# Patient Record
Sex: Female | Born: 1940
Health system: Southern US, Community
[De-identification: ages and names within clinical notes are randomized; demographics above are authoritative.]

## PROBLEM LIST (undated history)

## (undated) DIAGNOSIS — N952 Postmenopausal atrophic vaginitis: Secondary | ICD-10-CM

## (undated) DIAGNOSIS — M1990 Unspecified osteoarthritis, unspecified site: Secondary | ICD-10-CM

## (undated) DIAGNOSIS — M5430 Sciatica, unspecified side: Secondary | ICD-10-CM

## (undated) DIAGNOSIS — S86019A Strain of unspecified Achilles tendon, initial encounter: Secondary | ICD-10-CM

## (undated) DIAGNOSIS — K219 Gastro-esophageal reflux disease without esophagitis: Secondary | ICD-10-CM

## (undated) DIAGNOSIS — N3281 Overactive bladder: Secondary | ICD-10-CM

## (undated) DIAGNOSIS — I619 Nontraumatic intracerebral hemorrhage, unspecified: Secondary | ICD-10-CM

## (undated) DIAGNOSIS — F419 Anxiety disorder, unspecified: Secondary | ICD-10-CM

## (undated) DIAGNOSIS — N814 Uterovaginal prolapse, unspecified: Secondary | ICD-10-CM

## (undated) DIAGNOSIS — L409 Psoriasis, unspecified: Secondary | ICD-10-CM

## (undated) HISTORY — DX: Strain of unspecified achilles tendon, initial encounter: S86.019A

## (undated) HISTORY — DX: Overactive bladder: N32.81

## (undated) HISTORY — DX: Anxiety disorder, unspecified: F41.9

## (undated) HISTORY — DX: Uterovaginal prolapse, unspecified: N81.4

## (undated) HISTORY — DX: Nontraumatic intracerebral hemorrhage, unspecified: I61.9

## (undated) HISTORY — PX: KNEE SURGERY: SHX244

## (undated) HISTORY — PX: APPENDECTOMY: SHX54

## (undated) HISTORY — DX: Postmenopausal atrophic vaginitis: N95.2

## (undated) HISTORY — DX: Sciatica, unspecified side: M54.30

## (undated) HISTORY — PX: FOOT SURGERY: SHX648

## (undated) HISTORY — PX: TOTAL KNEE ARTHROPLASTY: SHX125

---

## 1997-10-19 ENCOUNTER — Other Ambulatory Visit: Admission: RE | Admit: 1997-10-19 | Discharge: 1997-10-19 | Payer: Self-pay | Admitting: Obstetrics and Gynecology

## 1998-02-02 ENCOUNTER — Encounter: Payer: Self-pay | Admitting: Emergency Medicine

## 1998-02-02 ENCOUNTER — Emergency Department (HOSPITAL_COMMUNITY): Admission: EM | Admit: 1998-02-02 | Discharge: 1998-02-02 | Payer: Self-pay | Admitting: Emergency Medicine

## 1998-11-18 ENCOUNTER — Other Ambulatory Visit: Admission: RE | Admit: 1998-11-18 | Discharge: 1998-11-18 | Payer: Self-pay | Admitting: Obstetrics and Gynecology

## 1999-12-13 ENCOUNTER — Other Ambulatory Visit: Admission: RE | Admit: 1999-12-13 | Discharge: 1999-12-13 | Payer: Self-pay | Admitting: Obstetrics and Gynecology

## 2001-03-10 ENCOUNTER — Other Ambulatory Visit: Admission: RE | Admit: 2001-03-10 | Discharge: 2001-03-10 | Payer: Self-pay | Admitting: Obstetrics and Gynecology

## 2002-03-13 ENCOUNTER — Other Ambulatory Visit: Admission: RE | Admit: 2002-03-13 | Discharge: 2002-03-13 | Payer: Self-pay | Admitting: Obstetrics and Gynecology

## 2003-06-23 ENCOUNTER — Other Ambulatory Visit: Admission: RE | Admit: 2003-06-23 | Discharge: 2003-06-23 | Payer: Self-pay | Admitting: Obstetrics and Gynecology

## 2004-09-22 ENCOUNTER — Other Ambulatory Visit: Admission: RE | Admit: 2004-09-22 | Discharge: 2004-09-22 | Payer: Self-pay | Admitting: Obstetrics and Gynecology

## 2005-10-30 ENCOUNTER — Other Ambulatory Visit: Admission: RE | Admit: 2005-10-30 | Discharge: 2005-10-30 | Payer: Self-pay | Admitting: Obstetrics and Gynecology

## 2006-01-29 ENCOUNTER — Encounter: Admission: RE | Admit: 2006-01-29 | Discharge: 2006-01-29 | Payer: Self-pay | Admitting: Internal Medicine

## 2006-12-09 ENCOUNTER — Other Ambulatory Visit: Admission: RE | Admit: 2006-12-09 | Discharge: 2006-12-09 | Payer: Self-pay | Admitting: Obstetrics and Gynecology

## 2007-03-10 ENCOUNTER — Ambulatory Visit (HOSPITAL_COMMUNITY): Admission: RE | Admit: 2007-03-10 | Discharge: 2007-03-10 | Payer: Self-pay | Admitting: Orthopedic Surgery

## 2007-06-23 ENCOUNTER — Inpatient Hospital Stay (HOSPITAL_COMMUNITY): Admission: RE | Admit: 2007-06-23 | Discharge: 2007-06-27 | Payer: Self-pay | Admitting: Orthopedic Surgery

## 2007-07-14 ENCOUNTER — Encounter: Admission: RE | Admit: 2007-07-14 | Discharge: 2007-07-14 | Payer: Self-pay | Admitting: Internal Medicine

## 2007-12-12 ENCOUNTER — Other Ambulatory Visit: Admission: RE | Admit: 2007-12-12 | Discharge: 2007-12-12 | Payer: Self-pay | Admitting: Obstetrics and Gynecology

## 2008-11-21 IMAGING — CR DG CHEST 2V
2 series · 2 of 2 positions shown · non-contrast
Comparison: 03/10/07.

CLINICAL DATA: OA of right knee.  Borderline O2 sats.  Assess for pneumonia. 
CHEST ? 2 VIEW:

[w chest pa]
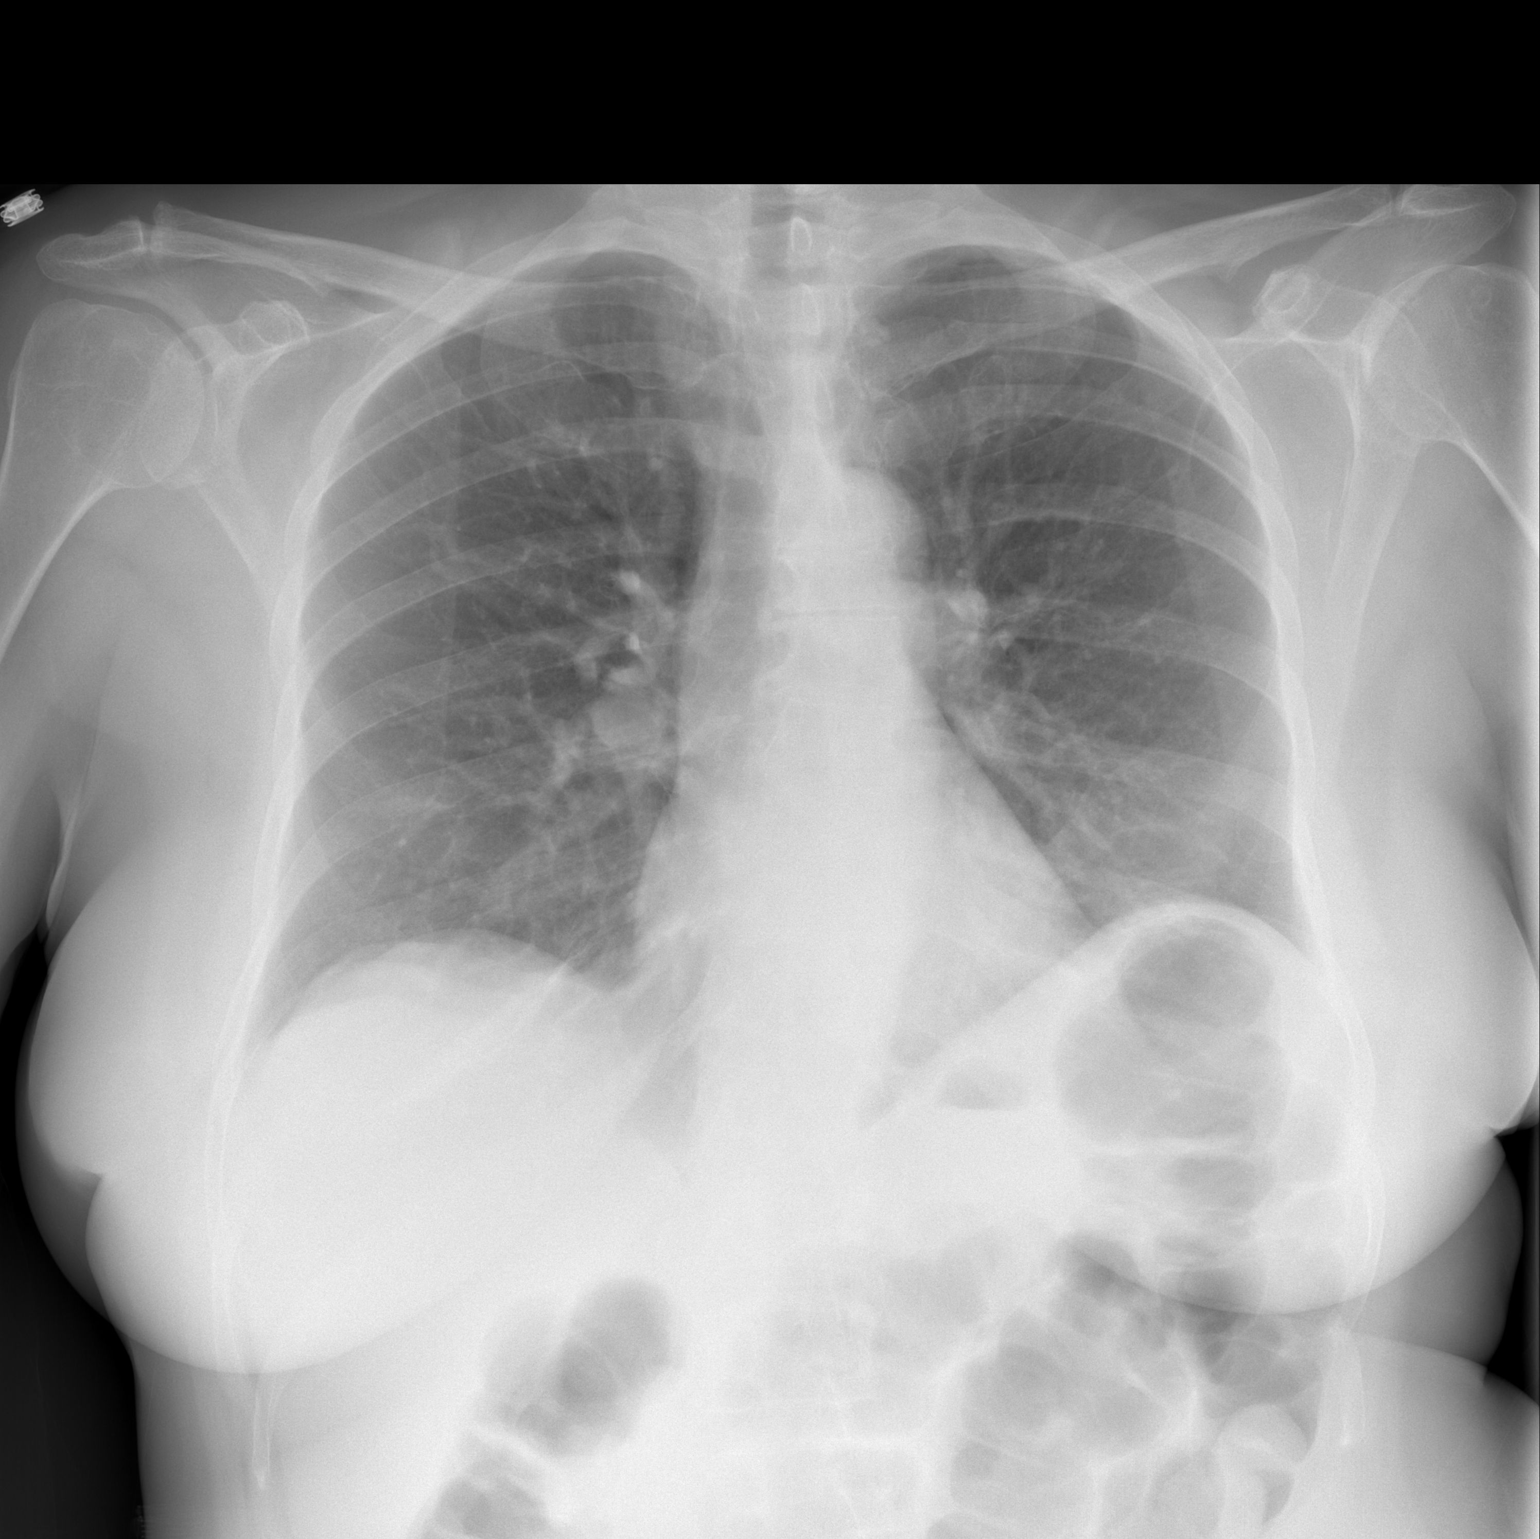

[w chest lat]
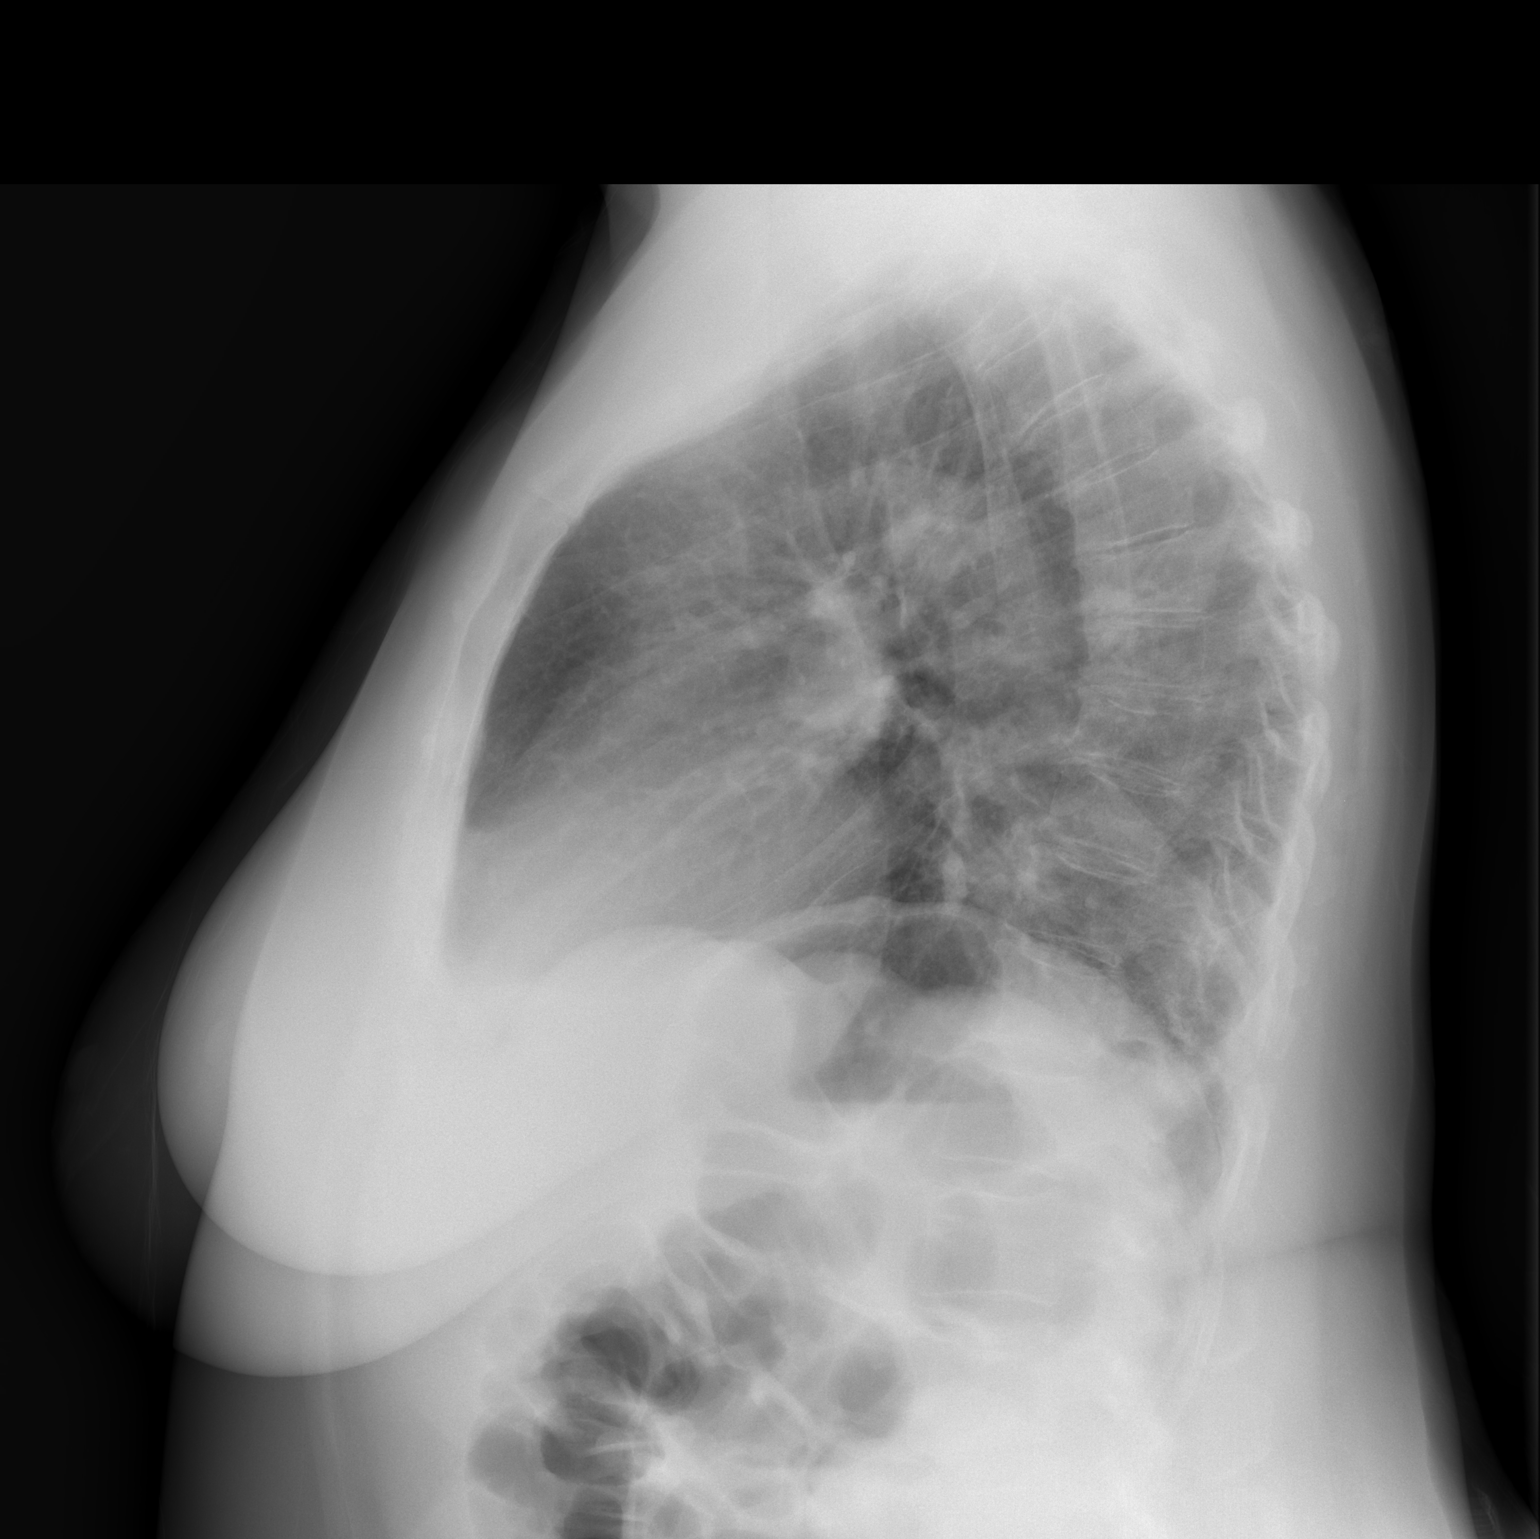

[2 of 2 positions shown; findings below may reference images not displayed]

FINDINGS: Negative for pneumonia or atelectasis.  Cardiomediastinal silhouette appears unremarkable.  Minimal chronic bronchitic changes are likely present.  Diffuse bony demineralization.  Degenerative spondylosis of the thoracic spine.  Gaseous distention of bowel loops in the upper abdomen in the colonic splenic flexure may represent an ileus.
IMPRESSION: Low-volume lungs.  Minimal chronic bronchitic changes.  Suspicion for an ileus.

## 2008-12-13 ENCOUNTER — Ambulatory Visit: Payer: Self-pay | Admitting: Obstetrics and Gynecology

## 2009-05-07 ENCOUNTER — Emergency Department (HOSPITAL_COMMUNITY): Admission: EM | Admit: 2009-05-07 | Discharge: 2009-05-07 | Payer: Self-pay | Admitting: Emergency Medicine

## 2009-05-23 ENCOUNTER — Inpatient Hospital Stay (HOSPITAL_COMMUNITY): Admission: RE | Admit: 2009-05-23 | Discharge: 2009-05-26 | Payer: Self-pay | Admitting: Orthopedic Surgery

## 2009-12-14 ENCOUNTER — Ambulatory Visit: Payer: Self-pay | Admitting: Women's Health

## 2009-12-14 ENCOUNTER — Other Ambulatory Visit: Admission: RE | Admit: 2009-12-14 | Discharge: 2009-12-14 | Payer: Self-pay | Admitting: Obstetrics and Gynecology

## 2010-09-19 LAB — BASIC METABOLIC PANEL
Calcium: 8.1 mg/dL — ABNORMAL LOW (ref 8.4–10.5)
Chloride: 105 mEq/L (ref 96–112)
Creatinine, Ser: 0.54 mg/dL (ref 0.4–1.2)
GFR calc non Af Amer: >60 mL/min (ref >60)
GFR calc non Af Amer: >60 mL/min (ref >60)
Sodium: 138 mEq/L (ref 135–145)

## 2010-09-19 LAB — CBC
HCT: 30.2 % — ABNORMAL LOW (ref 36.0–46.0)
MCHC: 32.5 g/dL (ref 30.0–36.0)
MCHC: 33.3 g/dL (ref 30.0–36.0)
MCV: 87.4 fL (ref 78.0–100.0)
MCV: 87.9 fL (ref 78.0–100.0)
Platelets: 182 10*3/uL (ref 150–400)
Platelets: 198 10*3/uL (ref 150–400)
Platelets: 223 10*3/uL (ref 150–400)
RBC: 3.09 MIL/uL — ABNORMAL LOW (ref 3.87–5.11)
RBC: 3.7 MIL/uL — ABNORMAL LOW (ref 3.87–5.11)
WBC: 6.7 10*3/uL (ref 4.0–10.5)
WBC: 6.8 10*3/uL (ref 4.0–10.5)
WBC: 8.4 10*3/uL (ref 4.0–10.5)
WBC: 9.6 10*3/uL (ref 4.0–10.5)

## 2010-09-19 LAB — PROTIME-INR
INR: 1.28 (ref 0.00–1.49)
INR: 1.95 — ABNORMAL HIGH (ref 0.00–1.49)
INR: 2.06 — ABNORMAL HIGH (ref 0.00–1.49)
Prothrombin Time: 15.9 seconds — ABNORMAL HIGH (ref 11.6–15.2)
Prothrombin Time: 22.1 seconds — ABNORMAL HIGH (ref 11.6–15.2)
Prothrombin Time: 23 seconds — ABNORMAL HIGH (ref 11.6–15.2)

## 2010-09-19 LAB — URINALYSIS, ROUTINE W REFLEX MICROSCOPIC
Ketones, ur: NEGATIVE mg/dL
Nitrite: NEGATIVE
Protein, ur: NEGATIVE mg/dL
pH: 7.5 (ref 5.0–8.0)

## 2010-09-19 LAB — COMPREHENSIVE METABOLIC PANEL
ALT: 20 U/L (ref 0–35)
AST: 18 U/L (ref 0–37)
Albumin: 3.7 g/dL (ref 3.5–5.2)
Calcium: 9.2 mg/dL (ref 8.4–10.5)
Sodium: 138 mEq/L (ref 135–145)
Total Protein: 7.2 g/dL (ref 6.0–8.3)

## 2010-09-19 LAB — TYPE AND SCREEN: ABO/RH(D): AB NEG

## 2010-09-19 LAB — APTT: aPTT: 28 seconds (ref 24–37)

## 2010-09-20 LAB — DIFFERENTIAL
Basophils Absolute: 0 10*3/uL (ref 0.0–0.1)
Lymphocytes Relative: 12 % (ref 12–46)
Monocytes Absolute: 0.8 10*3/uL (ref 0.1–1.0)
Neutro Abs: 7.8 10*3/uL — ABNORMAL HIGH (ref 1.7–7.7)

## 2010-09-20 LAB — POCT CARDIAC MARKERS
CKMB, poc: <1 ng/mL — ABNORMAL LOW (ref 1.0–8.0)
Troponin i, poc: <0.05 ng/mL (ref 0.00–0.09)

## 2010-09-20 LAB — URINE MICROSCOPIC-ADD ON

## 2010-09-20 LAB — URINALYSIS, ROUTINE W REFLEX MICROSCOPIC
Nitrite: NEGATIVE
Protein, ur: 30 mg/dL — AB
Specific Gravity, Urine: 1.027 (ref 1.005–1.030)
Urobilinogen, UA: 0.2 mg/dL (ref 0.0–1.0)

## 2010-09-20 LAB — CBC
HCT: 42.2 % (ref 36.0–46.0)
MCHC: 33.2 g/dL (ref 30.0–36.0)
MCV: 88.4 fL (ref 78.0–100.0)
Platelets: 187 10*3/uL (ref 150–400)
RDW: 13 % (ref 11.5–15.5)
WBC: 10 10*3/uL (ref 4.0–10.5)

## 2010-09-20 LAB — URINE CULTURE: Colony Count: >=100000

## 2010-09-20 LAB — COMPREHENSIVE METABOLIC PANEL
AST: 13 U/L (ref 0–37)
CO2: 27 mEq/L (ref 19–32)
Calcium: 9.3 mg/dL (ref 8.4–10.5)
Creatinine, Ser: 0.61 mg/dL (ref 0.4–1.2)
GFR calc non Af Amer: >60 mL/min (ref >60)

## 2010-09-20 LAB — LIPASE, BLOOD: Lipase: 15 U/L (ref 11–59)

## 2010-10-31 NOTE — H&P (Signed)
[name], [name]            ACCOUNT NO.:  [id number]   MEDICAL RECORD NO.:  [id number]          PATIENT TYPE:  INP   LOCATION:  NA                           FACILITY:  [facility]   PHYSICIAN:  [name], M.D.    DATE OF BIRTH:  1940-08-11   DATE OF ADMISSION:  06/23/2007  DATE OF DISCHARGE:                              HISTORY & PHYSICAL   DATE OF OFFICE VISIT HISTORY AND PHYSICAL:  June 06, 2007.   CHIEF COMPLAINT:  Right knee pain.   HISTORY OF PRESENT ILLNESS:  The patient is a 70 year old female who has  seen by [name] earlier this year for ongoing right knee pain.  She  was seen as a second opinion.  She has been having trouble for quite  some time now, especially over the past year, year and a half.  She has  undergone cortisone and viscous supplementation which only helped  intermittently over time.  She is known to have end-stage arthritis.  It  was felt she would benefit from undergoing a total knee replacement.  Risks and benefits have been discussed.  She elected to proceed with  surgery.  She was scheduled for earlier this year, but we noted that  there was a questionable metal allergy.  She has been seen by the  [facility] and undergone metal testing.  She is negative to cobalt, chrome, alloy and also negative to titanium.  It was felt she would benefit from undergoing knee replacement.  Risks  and benefits discussed, and she was subsequently admitted to the  [facility].   ALLERGIES:  NO KNOWN DRUG ALLERGIES.   CURRENT MEDICATIONS:  1. Soriatane.  2. Calcium.  3. Fosamax.  4. Cod liver oil.  5. Paxil.  6. Sudafed.   PAST MEDICAL HISTORY:  1. Anxiety.  2. Psoriasis.  3. History of bronchitis.  4. Osteoporosis.   PAST SURGICAL HISTORY:  1. Heel spur excision.  2. Laser knee surgery.  3. Appendectomy.   FAMILY HISTORY:  Mother with gallbladder cancer and arthritis.   SOCIAL HISTORY:  Married, retired.  About  eight cigarettes a day smoker.  No alcohol.  Husband will be assisting with care after surgery.   REVIEW OF SYSTEMS:  GENERAL:  No fevers, chills, night sweats.  NEUROLOGIC:  No seizures, syncope or paralysis.  RESPIRATORY:  No  shortness of breath, productive cough or hemoptysis.  CARDIOVASCULAR:  No chest pain, angina, orthopnea.  GI:  No nausea, vomiting, diarrhea or  constipation.  GU:  No dysuria, hematuria or discharge.  MUSCULOSKELETAL:  Right knee.   PHYSICAL EXAMINATION:  VITAL SIGNS:  Pulse 68, respirations 12, blood  pressure 128/76.  GENERAL:  A 70 year old African-American female, well-nourished, well-  developed, slightly overweight, tall frame, alert, oriented and  cooperative, pleasant.  HEENT:  Normocephalic, atraumatic.  Pupils are round and reactive.  Oropharynx clear.  Extraocular movements intact.  Does have a partial  denture plate.  NECK:  Supple.  CHEST:  Clear anterior and posterior chest walls.  HEART:  Regular rate and rhythm.  S1-S2 noted.  No murmurs.  ABDOMEN:  Soft, nontender, slightly round.  Bowel sounds present.  RECTAL/BREAST/GENITALIA:  Not done, not pertinent to present illness.  EXTREMITIES:  Right knee no effusion, marked crepitus, 10 degrees of  valgus.  No instability.   IMPRESSION:  Osteoarthritis right knee.   PLAN:  The patient will be admitted to [name] to undergo  right total knee arthroplasty.  Surgery will be performed by [name].  She has been seen preoperatively by [name] and felt  be in excellent health without any significant medical problems.  No  contraindications to the upcoming procedure.  [name] will be  notified of the room number on admission and will be consulted if needed  for medical assistance with the patient throughout the [facility] course.  Either [name] or [name] hospitalists will be consulted.      [name], P.A.C.      [name], M.D.   Electronically Signed    ALP/MEDQ  D:  06/17/2007  T:  06/17/2007  Job:  [id number]   cc:   [name], M.D.  Fax: [phone]

## 2010-10-31 NOTE — Op Note (Signed)
[name], [name] NO.:  [id number]   MEDICAL RECORD NO.:  [id number]          PATIENT TYPE:  INP   LOCATION:  0007                         FACILITY:  [facility]   PHYSICIAN:  Ollen Gross, M.D.    DATE OF BIRTH:  1940/07/20   DATE OF PROCEDURE:  DATE OF DISCHARGE:                               OPERATIVE REPORT   PREOPERATIVE DIAGNOSIS:  Osteoarthritis, right knee.   POSTOPERATIVE DIAGNOSIS:  Osteoarthritis, right knee.   PROCEDURE:  Right total knee arthroplasty.   SURGEON:  Ollen Gross, M.D.   ASSISTANT:  Alexzandrew L. Perkins, P.A.-C.   ANESTHESIA:  Spinal.   ESTIMATED BLOOD LOSS:  Minimal.   DRAIN:  None.   TOURNIQUET TIME:  36 minutes at 300 mmHg.   COMPLICATIONS:  None.   CONDITION:  Stable to recovery.   BRIEF CLINICAL NOTE:  [name] is a 70 year old female who has end-  stage arthritis of the right knee with progressively worsening pain and  deformity.  She has failed nonoperative management including injections  and presents for a total knee arthroplasty.   PROCEDURE IN DETAIL:  After the successful administration of a spinal  anesthetic with Duramorph, a tourniquet is placed high on her right  thigh and her right lower extremity is prepped and draped in the usual  sterile fashion.  The extremity is wrapped in an Esmarch, knee flexed  and tourniquet inflated to 300 mmHg.  A midline incision is made with a  10 blade through subcutaneous tissue to the level of the extensor  mechanism.  A fresh blade is used to make a medial parapatellar  arthrotomy.  Minimal soft tissue stripping is done medial and we also  elevated the soft tissue over the proximal lateral tibia.  Attention is  being paid to avoiding the patellar tendon on the tibial tubercle.  The  patella is everted, knee flexed 90 degrees, ACL and PCL removed.  A  drill is used to create a starting hole in the distal femur and the  canal is thoroughly irrigated.  The 5-degrees  right valgus alignment  guide is placed and referencing off the posterior condyles, rotation is  marked in the block pinned to remove 11 mm off the distal femur.  I did  11 due to her large flexion contracture.  Distal femoral resection is  made with an oscillating saw.  A sizing block is placed and a size 4 is  the most appropriate.  Rotation is marked off the epicondylar axis.  A  size 4 cutting block is placed and the anterior-posterior and chamfer  cuts are made.   Tibia is subluxed forward and menisci are removed.  Extramedullary  tibial alignment guide is placed referencing proximally at the medial  aspect of the tibial tubercle and distally along the second metatarsal  axis and tibial crest.  The block is pinned to remove approximately 4 mm  off the more deficient lateral side.  Tibial resection is made with an  oscillating saw.  Size 4 is the most appropriate tibial component and  the proximal tibia is prepared with the modular drill and  keel punch for  a size 4.  Femoral preparation is completed with the intercondylar cut.   A size 4 mobile bearing tibial tray and a size 4 posterior-stabilized  femoral trial and a 10-mm posterior-stabilized rotating platform insert  trial are placed.  With the 10 full extension is achieved with excellent  varus and valgus balance throughout full range of motion.  Patella is  then everted and thickness measured to be 23 mm.  Freehand resection is  taken to 14 mm, 38 template is placed, lug holes are drilled, trial  patella is placed, and it tracks normally.  Please note there were two  small arthritic cysts in the patella and the cysts are removed.  The  trial patella is placed and it tracks normally.  Osteophytes are removed  off the posterior femur with the trial in place.  All trials are removed  and the cut bone surfaces are prepared with pulsatile lavage.  Cement is  mixed and once ready for implantation, the size 4 mobile-bearing tibial   tray, size 4 posterior-stabilized femur and 38 patella are cemented into  place.  Patella is held with the clamp.  Trial 10-mm insert is placed,  knee held in full extension and all extruded cement removed.  When the  cement is fully hardened, then the permanent 10-mm posterior-stabilized  rotating platform insert is placed into the tibial tray.  The wound is  copiously irrigated with saline solution.  FloSeal had been injected on  the posterior capsule, then it was injected into the medial and lateral  gutters and  suprapatellar area.  A moist sponge is placed and the  tourniquet released for a total time of 36 minutes.  The sponge is  removed and minimal bleeding encountered.  The bleeding that is  encountered is stopped with electrocautery.  The wound is again  irrigated and the extensor mechanism closed with interrupted #1 PDS.  Flexion against gravity is over 130 degrees.  The subcu is closed with  interrupted 2-0 Vicryl and subcuticular running 4-0 Monocryl.  The  incision is cleaned and dried and Steri-Strips and a bulky sterile  dressing applied.  She is placed into a knee immobilizer, awakened and  transported to recovery in stable condition.      Ollen Gross, M.D.  Electronically Signed     FA/MEDQ  D:  06/23/2007  T:  06/23/2007  Job:  [id number]

## 2010-11-03 NOTE — Discharge Summary (Signed)
[name], [name]            ACCOUNT NO.:  [id number]   MEDICAL RECORD NO.:  [id number]          PATIENT TYPE:  INP   LOCATION:  1601                         [facility]ACILITY[facility]  [facility]   PHYSICIAN:  Ollen Gross, M.D.    DATE OF BIRTH:  12-27-1940   DATE OF ADMISSION:  06/23/2007  DATE OF DISCHARGE:  06/27/2007                               DISCHARGE SUMMARY   ADMITTING DIAGNOSES:  1. Osteoarthritis right knee.  2. Anxiety.  3. Psoriasis.  4. History of bronchitis.  5. Osteoporosis.   DISCHARGE DIAGNOSES:  1. Osteoarthritis right knee status post right total knee      arthroplasty.  2. Mild postop blood loss anemia.  3. Anxiety.  4. Psoriasis.  5. History of bronchitis.  6. Osteoporosis.   PROCEDURE:  June 23, 2007, right total knee.  Surgeon Dr. Lequita Halt,  assistant Brittany Peace PA-C.   CONSULTS:  None.   BRIEF HISTORY:  [name] is a 70 year old female with end-stage  arthritis of the right knee with progressive worsening pain and  deformity, failed nonoperative management including injections, now  presents for total knee arthroplasty.   LABORATORY DATA:  Preop CBC:  Hemoglobin of 14.5, hematocrit of 43.0,  white cell count 6.7, platelets 260.  Postop hemoglobin 12.0, drifted  down to 10.7 last H&H 9.8 and 28.8.  PT/PTT preop 12.8 and 27,  respectively.  INR 0.9.  Serial protimes followed.  PT/INR 22.7 and 1.9.  Chem panel on admission all within normal limits.  Serial B mets were  followed.  Electrolytes remained within normal limits.  Preop UA  negative.  Blood group type AB negative.  Chest x-ray June 26, 2007  low lung volumes, minimal chronic bronchitic changes suspicious of an  ileus, gaseous distention of bowel loops in the upper abdomen.   HOSPITAL COURSE:  The patient was admitted to [facility],  tolerated procedure well, later transferred to orthopedic floor, started  on PCA and p.o. analgesic for pain control following surgery.  Did have  a little bit of pain in her knee, otherwise, doing pretty well on the  morning of day one.  Had a history of anxiety, so we started a short-  acting anxiety medication.  She took Paxil that was resumed.  Had a  little bit of low urine output so we did not gentle fluids challenge.  Hemoglobin was stable at 12.  Started to get up out of bed by day two,  was doing very well.  Dressing change incision looked good.  Hemoglobin  was stable at 10.  From a therapy standpoint, he walked 50 feet on day  #1, got up about 150 feet by day #2 and day #3.  On the morning of day  #3, her O2 sats were a little on the lower side, so we checked a chest x-  ray (noted as above).  Incision looked good.  She was ambulating well.  Due to the findings, we kept her one more day.  She continued to  ambulate well by the following day of June 27, 2007, she was feeling  better.  O2 sats  improved.  She was passing flatus, no complaints.  Clinically did not appear to have an ileus at all.  Progressing well.  Incision looked good and was discharged home.   DISCHARGE PLAN:  1. Discharged home on June 27, 2007.  2. Discharge diagnoses please see above.  3. Discharge medications:  Percocet, Robaxin, Coumadin, also over-the-      counter stool softener and laxative.   ACTIVITY:  Weightbearing as tolerated right leg.  Home Health PT/Home  Health nursing total knee protocol.  Follow-up 2 weeks.   DISPOSITION:  Home.   CONDITION ON DISCHARGE:  Improved.      Alexzandrew L. Perkins, P.A.C.      Ollen Gross, M.D.  Electronically Signed    ALP/MEDQ  D:  08/05/2007  T:  08/05/2007  Job:  [id number]   cc:   Ollen Gross, M.D.  Fax: [phone]   Thora Lance, M.D.  Fax: [phone]

## 2010-12-21 ENCOUNTER — Encounter (INDEPENDENT_AMBULATORY_CARE_PROVIDER_SITE_OTHER): Payer: Medicare Other | Admitting: Obstetrics and Gynecology

## 2010-12-21 DIAGNOSIS — N814 Uterovaginal prolapse, unspecified: Secondary | ICD-10-CM

## 2010-12-21 DIAGNOSIS — M810 Age-related osteoporosis without current pathological fracture: Secondary | ICD-10-CM

## 2010-12-21 DIAGNOSIS — R82998 Other abnormal findings in urine: Secondary | ICD-10-CM

## 2010-12-21 DIAGNOSIS — N952 Postmenopausal atrophic vaginitis: Secondary | ICD-10-CM

## 2011-03-08 LAB — BASIC METABOLIC PANEL
BUN: 7
CO2: 28
CO2: 29
Calcium: 8.4
Chloride: 107
Chloride: 109
Creatinine, Ser: 0.54
Glucose, Bld: 118 — ABNORMAL HIGH
Glucose, Bld: 133 — ABNORMAL HIGH
Potassium: 4.1
Sodium: 140

## 2011-03-08 LAB — TYPE AND SCREEN: Antibody Screen: NEGATIVE

## 2011-03-08 LAB — CBC
HCT: 31.6 — ABNORMAL LOW
HCT: 36
Hemoglobin: 12
Hemoglobin: 9.8 — ABNORMAL LOW
MCHC: 33.4
MCHC: 33.9
MCHC: 34.1
MCV: 84.7
MCV: 85.9
MCV: 86.4
Platelets: 169
RDW: 12.7
RDW: 13.1

## 2011-03-08 LAB — PROTIME-INR
INR: 2 — ABNORMAL HIGH
Prothrombin Time: 22.9 — ABNORMAL HIGH
Prothrombin Time: 23.5 — ABNORMAL HIGH

## 2011-03-23 LAB — COMPREHENSIVE METABOLIC PANEL
AST: 22
BUN: 15
CO2: 28
Calcium: 9.7
Chloride: 104
Creatinine, Ser: 0.61
GFR calc Af Amer: >60
GFR calc non Af Amer: >60
Total Bilirubin: 0.7

## 2011-03-23 LAB — PROTIME-INR
INR: 0.9
Prothrombin Time: 12.8

## 2011-03-23 LAB — APTT: aPTT: 27

## 2011-03-23 LAB — CBC
HCT: 43
MCHC: 33.7
MCV: 84.7
RBC: 5.07

## 2011-03-23 LAB — URINALYSIS, ROUTINE W REFLEX MICROSCOPIC
Bilirubin Urine: NEGATIVE
Ketones, ur: NEGATIVE
Nitrite: NEGATIVE
Protein, ur: NEGATIVE
Urobilinogen, UA: 0.2
pH: 6.5

## 2011-03-29 LAB — URINALYSIS, ROUTINE W REFLEX MICROSCOPIC
Bilirubin Urine: NEGATIVE
Hgb urine dipstick: NEGATIVE
Ketones, ur: NEGATIVE
Nitrite: NEGATIVE
Protein, ur: NEGATIVE
Specific Gravity, Urine: 1.014
Urobilinogen, UA: 0.2

## 2011-03-29 LAB — TYPE AND SCREEN
ABO/RH(D): AB NEG
Antibody Screen: NEGATIVE

## 2011-03-29 LAB — ABO/RH: ABO/RH(D): AB NEG

## 2011-03-29 LAB — COMPREHENSIVE METABOLIC PANEL
ALT: 20
AST: 20
Albumin: 3.9
CO2: 31
Calcium: 10.2
Chloride: 104
GFR calc Af Amer: >60
GFR calc non Af Amer: >60
Sodium: 143

## 2011-03-29 LAB — CBC
HCT: 43.3
Hemoglobin: 14.5
MCHC: 33.5

## 2011-03-29 LAB — URINE MICROSCOPIC-ADD ON

## 2011-10-23 ENCOUNTER — Other Ambulatory Visit: Payer: Self-pay | Admitting: Neurosurgery

## 2011-10-23 DIAGNOSIS — M4310 Spondylolisthesis, site unspecified: Secondary | ICD-10-CM

## 2011-10-27 ENCOUNTER — Ambulatory Visit
Admission: RE | Admit: 2011-10-27 | Discharge: 2011-10-27 | Disposition: A | Discharge status: Home / Self Care | Payer: BC Managed Care – PPO | Source: Ambulatory Visit | Attending: Neurosurgery | Admitting: Neurosurgery

## 2011-10-27 DIAGNOSIS — M4310 Spondylolisthesis, site unspecified: Secondary | ICD-10-CM

## 2011-12-05 ENCOUNTER — Encounter (HOSPITAL_COMMUNITY): Payer: Self-pay | Admitting: *Deleted

## 2011-12-05 ENCOUNTER — Emergency Department (HOSPITAL_COMMUNITY)
Admission: EM | Admit: 2011-12-05 | Discharge: 2011-12-05 | Disposition: A | Discharge status: Home / Self Care | Payer: Medicare Other | Attending: Emergency Medicine | Admitting: Emergency Medicine

## 2011-12-05 DIAGNOSIS — F411 Generalized anxiety disorder: Secondary | ICD-10-CM | POA: Insufficient documentation

## 2011-12-05 DIAGNOSIS — Z23 Encounter for immunization: Secondary | ICD-10-CM | POA: Insufficient documentation

## 2011-12-05 DIAGNOSIS — Z96659 Presence of unspecified artificial knee joint: Secondary | ICD-10-CM | POA: Insufficient documentation

## 2011-12-05 DIAGNOSIS — Z203 Contact with and (suspected) exposure to rabies: Secondary | ICD-10-CM

## 2011-12-05 DIAGNOSIS — Z800 Family history of malignant neoplasm of digestive organs: Secondary | ICD-10-CM | POA: Insufficient documentation

## 2011-12-05 DIAGNOSIS — M810 Age-related osteoporosis without current pathological fracture: Secondary | ICD-10-CM | POA: Insufficient documentation

## 2011-12-05 DIAGNOSIS — N318 Other neuromuscular dysfunction of bladder: Secondary | ICD-10-CM | POA: Insufficient documentation

## 2011-12-05 DIAGNOSIS — Z9089 Acquired absence of other organs: Secondary | ICD-10-CM | POA: Insufficient documentation

## 2011-12-05 DIAGNOSIS — F17200 Nicotine dependence, unspecified, uncomplicated: Secondary | ICD-10-CM | POA: Insufficient documentation

## 2011-12-05 MED ORDER — RABIES VACCINE, PCEC IM SUSR
1.0000 mL | Freq: Once | INTRAMUSCULAR | Status: AC
  Administered 2011-12-05: 1 mL via INTRAMUSCULAR
  Filled 2011-12-05: qty 1

## 2011-12-05 MED ORDER — RABIES IMMUNE GLOBULIN 150 UNIT/ML IM INJ
20.0000 [IU]/kg | INJECTION | Freq: Once | INTRAMUSCULAR | Status: AC
  Administered 2011-12-05: 1725 [IU] via INTRAMUSCULAR
  Filled 2011-12-05: qty 11.5

## 2011-12-05 NOTE — ED Notes (Signed)
Pt reports bat was noted in house a week ago. Pt reports no fever. No areas of infection from bite. Reports is in normal health.

## 2011-12-05 NOTE — ED Notes (Signed)
Patient states she noticed something on her ceiling 2 weeks ago.   It was a bat that was in her home.

## 2011-12-05 NOTE — Discharge Instructions (Signed)
You have to have the second dose applied in two days.  Please see your physician for this medication, or return to the ED.  If you develop any new, or concerning changes in your condition, please return to the ED. 

## 2011-12-05 NOTE — ED Provider Notes (Signed)
History   This chart was scribed for Gerhard Munch, MD by Shari Heritage. The patient was seen in room TR11C/TR11C. Patient's care was started at 1248.     CSN: [id number]  Arrival date & time 12/05/11  1248   First MD Initiated Contact with Patient 12/05/11 1504      Chief Complaint  Patient presents with  . Rabies Injection    (Consider location/radiation/quality/duration/timing/severity/associated sxs/prior treatment) The history is provided by the patient. No language interpreter was used.   [name] is a 71 y.o. female who presents to the Emergency Department concerned that she has been infected with rabies by a bat. Patient says that she noticed a bat in her home on June 8th. Patient denies notable changes or new symptoms. Patient says that she is reasonably healthy. Patient with h/o sciatica, anxiety, and osteoporosis. Patient with surgical h/o appendectomy, foot surgery, knee surgery and total knee arthroplasty.   Past Medical History  Diagnosis Date  . Anxiety   . Detrusor instability   . Osteoporosis   . Uterine prolapse   . Atrophic vaginitis     Past Surgical History  Procedure Date  . Appendectomy   . Foot surgery   . Knee surgery   . Total knee arthroplasty     X2-- 2009; 2010    Family History  Problem Relation Age of Onset  . Cancer Mother     GALLBLADDER     History  Substance Use Topics  . Smoking status: Current Everyday Smoker  . Smokeless tobacco: Not on file  . Alcohol Use: Yes    OB History    Grav Para Term Preterm Abortions TAB SAB Ect Mult Living                  Review of Systems  Constitutional:       Per HPI, otherwise negative  HENT:       Per HPI, otherwise negative  Eyes: Negative.   Respiratory:       Per HPI, otherwise negative  Cardiovascular:       Per HPI, otherwise negative  Gastrointestinal: Negative for vomiting.  Genitourinary: Negative.   Musculoskeletal:       Per HPI, otherwise negative    Skin: Negative.   Neurological: Negative for syncope.    Allergies  Review of patient's allergies indicates no known allergies.  Home Medications   Current Outpatient Rx  Name Route Sig Dispense Refill  . CALCIUM CITRATE-VITAMIN D 315-200 MG-UNIT PO TABS Oral Take 1 tablet by mouth 2 (two) times daily.     Marland Kitchen VITAMIN D 1000 UNITS PO TABS Oral Take 1,000 Units by mouth daily.    . COD LIVER OIL PO Oral Take 1 capsule by mouth daily.    Marland Kitchen FLUOXETINE HCL 40 MG PO CAPS Oral Take 40 mg by mouth daily.      . MULTIVITAMINS PO CAPS Oral Take 1 capsule by mouth daily.      Marland Kitchen NAPROXEN SODIUM 220 MG PO TABS Oral Take 220 mg by mouth as needed.      . OXYCODONE-ACETAMINOPHEN 5-325 MG PO TABS Oral Take 1 tablet by mouth every 6 (six) hours as needed. For pain      BP 145/62  Pulse 62  Temp 98.1 F (36.7 C) (Oral)  Resp 16  Ht 5\' 7"  (1.702 m)  Wt 190 lb (86.183 kg)  BMI 29.76 kg/m2  SpO2 97%  Physical Exam  Nursing note and vitals reviewed.  Constitutional: She is oriented to person, place, and time. She appears well-developed and well-nourished. No distress.  HENT:  Head: Normocephalic and atraumatic.  Eyes: Conjunctivae and EOM are normal.  Cardiovascular: Normal rate and regular rhythm.   Pulmonary/Chest: Effort normal and breath sounds normal. No stridor. No respiratory distress.  Abdominal: She exhibits no distension.  Musculoskeletal: She exhibits no edema.  Neurological: She is alert and oriented to person, place, and time. No cranial nerve deficit.  Skin: Skin is warm and dry.  Psychiatric: She has a normal mood and affect.    ED Course  Procedures (including critical care time)     COORDINATION OF CARE: 3:23PM- Patient informed of current plan for treatment and evaluation and agrees with plan at this time. Explained rabies prevention treatment course. Patient has opted to receive the rabies prevention treatment.   Labs Reviewed - No data to display No results  found.   No diagnosis found.    MDM  I personally performed the services described in this documentation, which was scribed in my presence. The recorded information has been reviewed and considered.  This generally well-appearing female presents with concerns over rabies exposure.  Interestingly, the patient has had bat in her apartment twice within the past week.  The patient has a superficial, minor appearing lesion on her left wrist, but otherwise no obvious possible bites.  However, given the patient's exposure 2 packs, her concern over possible rabies development, she requested prophylaxis.  Per cdc recommendations, the patient was provided with immunoglobulin and vaccine   Gerhard Munch, MD 12/05/11 1635

## 2011-12-06 NOTE — ED Notes (Signed)
Spoke w/pt informed of shot schedule and to call [facility] to sched appts.   Day 0  - 6/19  gived en ED Day 3  - 6/22  To be given at [facility] Day 7  - 6/26  To be given at [facility] Day 14  - 7/3  To be given at [facility] schedule faxed to [facility] and pharmacy x 2.

## 2011-12-08 ENCOUNTER — Encounter (HOSPITAL_COMMUNITY): Payer: Self-pay

## 2011-12-08 ENCOUNTER — Emergency Department (INDEPENDENT_AMBULATORY_CARE_PROVIDER_SITE_OTHER)
Admission: EM | Admit: 2011-12-08 | Discharge: 2011-12-08 | Disposition: A | Discharge status: Home / Self Care | Payer: Medicare Other | Source: Home / Self Care

## 2011-12-08 DIAGNOSIS — Z23 Encounter for immunization: Secondary | ICD-10-CM

## 2011-12-08 MED ORDER — RABIES VACCINE, PCEC IM SUSR
INTRAMUSCULAR | Status: AC
  Filled 2011-12-08: qty 1

## 2011-12-08 MED ORDER — RABIES VACCINE, PCEC IM SUSR
1.0000 mL | Freq: Once | INTRAMUSCULAR | Status: AC
  Administered 2011-12-08: 1 mL via INTRAMUSCULAR

## 2011-12-08 NOTE — Discharge Instructions (Signed)
Please return on 6/26 at noon for next injection 

## 2011-12-08 NOTE — ED Notes (Signed)
Pt in [facility] for second series of rabies injection

## 2011-12-12 ENCOUNTER — Emergency Department (INDEPENDENT_AMBULATORY_CARE_PROVIDER_SITE_OTHER)
Admission: EM | Admit: 2011-12-12 | Discharge: 2011-12-12 | Disposition: A | Discharge status: Home / Self Care | Payer: Medicare Other | Source: Home / Self Care | Attending: Emergency Medicine | Admitting: Emergency Medicine

## 2011-12-12 ENCOUNTER — Encounter (HOSPITAL_COMMUNITY): Payer: Self-pay | Admitting: *Deleted

## 2011-12-12 DIAGNOSIS — Z203 Contact with and (suspected) exposure to rabies: Secondary | ICD-10-CM

## 2011-12-12 MED ORDER — RABIES VACCINE, PCEC IM SUSR
INTRAMUSCULAR | Status: AC
  Filled 2011-12-12: qty 1

## 2011-12-12 MED ORDER — RABIES VACCINE, PCEC IM SUSR
1.0000 mL | Freq: Once | INTRAMUSCULAR | Status: AC
  Administered 2011-12-12: 1 mL via INTRAMUSCULAR

## 2011-12-12 NOTE — ED Notes (Signed)
Here for third rabies vaccine for bat exposure.

## 2011-12-12 NOTE — Discharge Instructions (Signed)
Call if any problems.  Return 7/3 for last vaccine. 

## 2011-12-19 ENCOUNTER — Encounter (HOSPITAL_COMMUNITY): Payer: Self-pay | Admitting: *Deleted

## 2011-12-19 ENCOUNTER — Emergency Department (INDEPENDENT_AMBULATORY_CARE_PROVIDER_SITE_OTHER)
Admission: EM | Admit: 2011-12-19 | Discharge: 2011-12-19 | Disposition: A | Discharge status: Home / Self Care | Payer: Medicare Other | Source: Home / Self Care

## 2011-12-19 DIAGNOSIS — Z203 Contact with and (suspected) exposure to rabies: Secondary | ICD-10-CM

## 2011-12-19 MED ORDER — RABIES VACCINE, PCEC IM SUSR
INTRAMUSCULAR | Status: AC
  Filled 2011-12-19: qty 1

## 2011-12-19 MED ORDER — RABIES VACCINE, PCEC IM SUSR
1.0000 mL | Freq: Once | INTRAMUSCULAR | Status: AC
  Administered 2011-12-19: 1 mL via INTRAMUSCULAR

## 2011-12-19 NOTE — ED Notes (Signed)
Here for last rabies vaccine for bat exposure.  No previous problems from injections.

## 2011-12-24 ENCOUNTER — Encounter: Payer: Self-pay | Admitting: Obstetrics and Gynecology

## 2011-12-24 ENCOUNTER — Ambulatory Visit (INDEPENDENT_AMBULATORY_CARE_PROVIDER_SITE_OTHER): Payer: Medicare Other | Admitting: Obstetrics and Gynecology

## 2011-12-24 ENCOUNTER — Other Ambulatory Visit: Payer: Self-pay | Admitting: Gynecology

## 2011-12-24 VITALS — BP 124/80 | Ht 67.5 in | Wt 194.0 lb

## 2011-12-24 DIAGNOSIS — N952 Postmenopausal atrophic vaginitis: Secondary | ICD-10-CM

## 2011-12-24 DIAGNOSIS — B009 Herpesviral infection, unspecified: Secondary | ICD-10-CM

## 2011-12-24 DIAGNOSIS — R350 Frequency of micturition: Secondary | ICD-10-CM

## 2011-12-24 DIAGNOSIS — M5430 Sciatica, unspecified side: Secondary | ICD-10-CM | POA: Insufficient documentation

## 2011-12-24 DIAGNOSIS — N814 Uterovaginal prolapse, unspecified: Secondary | ICD-10-CM

## 2011-12-24 DIAGNOSIS — N3281 Overactive bladder: Secondary | ICD-10-CM | POA: Insufficient documentation

## 2011-12-24 DIAGNOSIS — M810 Age-related osteoporosis without current pathological fracture: Secondary | ICD-10-CM

## 2011-12-24 MED ORDER — VALACYCLOVIR HCL 500 MG PO TABS: 500.0000 mg | ORAL_TABLET | Freq: Two times a day (BID) | ORAL | Status: DC

## 2011-12-24 NOTE — Progress Notes (Addendum)
Patient came to see me today for further followup. She does have uterine prolapse but is basically asymptomatic. She is having no vaginal bleeding. She is having no pelvic pain. She does have vaginal dryness but uses a lubricant. She has urinary frequency without urgency or incontinence. She is due for her mammogram. She is due for followup bone density. At one time she had osteoporosis but her last bone density just showed mild osteopenia. She was on Fosamax from 2006 to 2011. She has had no fractures. Her last bone density was in 2010. She has always had normal Pap smears. Her last Pap smear was 2011. She has a history of HSV on her buttocks. Her PCP has been treating her with Valtrex only for reoccurrences and she is getting outbreaks every 2 months. She is not having significant menopausal symptoms.  ROS: 12 system review done. Pertinent positives above. Other positive is sciatica.  Physical examination: Kennon Portela present. HEENT within normal limits. Neck: Thyroid not large. No masses. Supraclavicular nodes: not enlarged. Breasts: Examined in both sitting and lying  position. No skin changes and no masses. Abdomen: Soft no guarding rebound or masses or hernia. Pelvic: External: Within normal limits. BUS: Within normal limits. Vaginal:within normal limits. Poor estrogen effect. No evidence of cystocele rectocele or enterocele. Cervix: clean. Uterus: Normal size and shape first degree uterine prolapse. Adnexa: No masses. Rectovaginal exam: Confirmatory and negative.  Assessment: Uterine prolapse. Osteoporosis-much improved. HSV. Atrophic vaginitis. Urinary frequency.  Plan: Mammogram and  bone density.  Started patient on Valtrex 500 mg daily for prevention. Observation of other  GYN problems. Extremities: Within normal limits.The new Pap smear guidelines were discussed with the patient. No pap done.

## 2011-12-25 ENCOUNTER — Telehealth: Payer: Self-pay | Admitting: *Deleted

## 2011-12-25 LAB — URINALYSIS W MICROSCOPIC + REFLEX CULTURE
Bacteria, UA: NONE SEEN
Bilirubin Urine: NEGATIVE
Glucose, UA: NEGATIVE mg/dL
Hgb urine dipstick: NEGATIVE
Ketones, ur: NEGATIVE mg/dL
Protein, ur: NEGATIVE mg/dL
Urobilinogen, UA: 0.2 mg/dL (ref 0.0–1.0)

## 2011-12-25 MED ORDER — METRONIDAZOLE 0.75 % VA GEL: 1.0000 | Freq: Every day | VAGINAL | Status: AC

## 2011-12-25 NOTE — Telephone Encounter (Signed)
MetroGel vaginal cream 1 applicator full at bedtime in vagina for 2 weeks.

## 2011-12-25 NOTE — Addendum Note (Signed)
Addended by: Aura Camps on: 12/25/2011 04:00 PM   Modules accepted: Orders

## 2011-12-25 NOTE — Telephone Encounter (Signed)
Pt informed with the below note, rx sent. 

## 2011-12-25 NOTE — Telephone Encounter (Signed)
That should have been 1 applicator full at bedtime in  vagina for 5 days.

## 2011-12-25 NOTE — Telephone Encounter (Signed)
Pt had annual exam yesterday no pap was done due to new guidelines, pt forgot to speak with you about vaginal odor x 2 weeks, no discharge, no itching. Pt is requesting rx, please advise

## 2011-12-26 LAB — URINE CULTURE: Colony Count: 8000

## 2012-12-09 ENCOUNTER — Other Ambulatory Visit (HOSPITAL_COMMUNITY): Payer: Self-pay | Admitting: Orthopedic Surgery

## 2012-12-09 DIAGNOSIS — M25562 Pain in left knee: Secondary | ICD-10-CM

## 2012-12-16 ENCOUNTER — Encounter (HOSPITAL_COMMUNITY)
Admission: RE | Admit: 2012-12-16 | Discharge: 2012-12-16 | Disposition: A | Discharge status: Home / Self Care | Payer: Medicare PPO | Source: Ambulatory Visit | Attending: Orthopedic Surgery | Admitting: Orthopedic Surgery

## 2012-12-16 DIAGNOSIS — M25562 Pain in left knee: Secondary | ICD-10-CM

## 2012-12-16 DIAGNOSIS — Z96659 Presence of unspecified artificial knee joint: Secondary | ICD-10-CM | POA: Insufficient documentation

## 2012-12-16 DIAGNOSIS — M25569 Pain in unspecified knee: Secondary | ICD-10-CM | POA: Insufficient documentation

## 2012-12-16 MED ORDER — TECHNETIUM TC 99M MEDRONATE IV KIT
25.0000 | PACK | Freq: Once | INTRAVENOUS | Status: AC | PRN
  Administered 2012-12-16: 25 via INTRAVENOUS

## 2012-12-29 ENCOUNTER — Encounter: Payer: Self-pay | Admitting: Gynecology

## 2012-12-29 ENCOUNTER — Ambulatory Visit (INDEPENDENT_AMBULATORY_CARE_PROVIDER_SITE_OTHER): Payer: Medicare PPO | Admitting: Gynecology

## 2012-12-29 VITALS — BP 112/68 | Ht 67.0 in | Wt 190.0 lb

## 2012-12-29 DIAGNOSIS — N3281 Overactive bladder: Secondary | ICD-10-CM

## 2012-12-29 DIAGNOSIS — N814 Uterovaginal prolapse, unspecified: Secondary | ICD-10-CM

## 2012-12-29 DIAGNOSIS — N952 Postmenopausal atrophic vaginitis: Secondary | ICD-10-CM

## 2012-12-29 DIAGNOSIS — M8580 Other specified disorders of bone density and structure, unspecified site: Secondary | ICD-10-CM

## 2012-12-29 MED ORDER — NONFORMULARY OR COMPOUNDED ITEM: Status: DC

## 2012-12-29 NOTE — Progress Notes (Signed)
[name] [birth year] [id number]   History:    72 y.o.  Who comes to the office complaining of vaginal dryness and irritation.She  has tried over-the-counter lubricants with minimal improvement. She does have history of urinary frequency without urgency or any incontinence. She stated her last mammogram was in December 2013 although we do not have the report. Her bone density is overdue her last was in 2010. Patient with prior history of osteoporosis had been treated with Fosamax from 2006-2011 and has been on a drug called a and her last bone density study demonstrated that she was in the osteopenic range. She is taking 1200 mg of calcium daily and at 1000 units of vitamin D daily and states that she will irregularly exercises. Patient denies any past history of abnormal Pap smear. 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 Her last mammogram was in 2013. Her last colonoscopy was normal in 2006. Her primary physician is Dr. Kirby Funk who does her lab work.  Patient states that her shingles, pneumococcal, and Tdap vaccine are all up-to-date. Past medical history,surgical history, family history and social history were all reviewed and documented in the EPIC chart.  Gynecologic History No LMP recorded. Patient is postmenopausal. Contraception: post menopausal status Last Pap: 2011. Results were: normal Last mammogram: 2013. Results were: normal Obstetric History OB History   Grav Para Term Preterm Abortions TAB SAB Ect Mult Living   3 3 3      1 4      # Outc Date GA Lbr Len/2nd Wgt Sex Del Anes PTL Lv   1 TRM            2 TRM            3 TRM                ROS: A ROS was performed and pertinent positives and negatives are included in the history.  GENERAL: No fevers or chills. HEENT: No change in vision, no earache, sore throat or sinus congestion. NECK: No pain or stiffness. CARDIOVASCULAR: No chest pain or pressure. No palpitations. PULMONARY: No shortness of  breath, cough or wheeze. GASTROINTESTINAL: No abdominal pain, nausea, vomiting or diarrhea, melena or bright red blood per rectum. GENITOURINARY: No urinary frequency, urgency, hesitancy or dysuria. MUSCULOSKELETAL: No joint or muscle pain, no back pain, no recent trauma. DERMATOLOGIC: No rash, no itching, no lesions. ENDOCRINE: No polyuria, polydipsia, no heat or cold intolerance. No recent change in weight. HEMATOLOGICAL: No anemia or easy bruising or bleeding. NEUROLOGIC: No headache, seizures, numbness, tingling or weakness. PSYCHIATRIC: No depression, no loss of interest in normal activity or change in sleep pattern.     Exam: chaperone present  BP 112/68  Ht 5\' 7"  (1.702 m)  Wt 190 lb (86.183 kg)  BMI 29.75 kg/m2  Body mass index is 29.75 kg/(m^2).  General appearance : Well developed well nourished female. No acute distress HEENT: Neck supple, trachea midline, no carotid bruits, no thyroidmegaly Lungs: Clear to auscultation, no rhonchi or wheezes, or rib retractions  Heart: Regular rate and rhythm, no murmurs or gallops Breast:Examined in sitting and supine position were symmetrical in appearance, no palpable masses or tenderness,  no skin retraction, no nipple inversion, no nipple discharge, no skin discoloration, no axillary or supraclavicular lymphadenopathy Abdomen: no palpable masses or tenderness, no rebound or guarding Extremities: no edema or skin discoloration or tenderness  Pelvic:  Bartholin, Urethra, Skene Glands: Within normal limits  Vagina: No gross lesions or discharge, atrophy  Cervix: No gross lesions or discharge  Uterus  axial, normal size, shape and consistency, non-tender and mobile, first degree prolapse  Adnexa  Without masses or tenderness  Anus and perineum  normal   Rectovaginal  normal sphincter tone without palpated masses or tenderness             Hemoccult card provided     Assessment/Plan:  72 y.o. female with vaginal atrophy will be  started on estradiol 0.02% 1 mL pre-filled  applicator to apply twice a week vaginally. The risks benefits and pros and cons of estrogens were discussed with the patient. She was reminded to schedule her bone density and mammogram at the end of this year. We discussed importance of calcium, vitamin D and regular exercise for osteoporosis prevention. Hemoccult cards were provided for the patient to submit to the office for testing at a later date.   Ok Edwards MD, 10:13 AM 12/29/2012

## 2012-12-29 NOTE — Patient Instructions (Addendum)

## 2013-01-08 ENCOUNTER — Other Ambulatory Visit: Payer: Self-pay | Admitting: Obstetrics and Gynecology

## 2013-01-08 NOTE — Telephone Encounter (Signed)
Former patient of Dr. Delorise Royals. At 12/24/11 CE he wrote "Started patient on Valtrex 500 mg daily for prevention. "  (Patient said she forgot to ask you for Rx when she was here for CE with you this month.)

## 2013-01-13 ENCOUNTER — Other Ambulatory Visit: Payer: Self-pay | Admitting: Orthopedic Surgery

## 2013-01-13 NOTE — Progress Notes (Signed)
Surgery scheduled for 01/23/13.  Preop on 01/19/13 at 130pm

## 2013-01-13 NOTE — Progress Notes (Signed)
Preoperative surgical orders have been place into the Epic hospital system for [name] on 01/13/2013, 2:41 PM  by Patrica Duel for surgery on 01/23/2013.  Preop Total Knee orders including Experal, PO Tylenol, and IV Decadron as long as there are no contraindications to the above medications. [name], PA-C

## 2013-01-14 ENCOUNTER — Encounter (HOSPITAL_COMMUNITY): Payer: Self-pay | Admitting: Pharmacy Technician

## 2013-01-16 ENCOUNTER — Other Ambulatory Visit (HOSPITAL_COMMUNITY): Payer: Self-pay | Admitting: Orthopedic Surgery

## 2013-01-16 NOTE — Patient Instructions (Addendum)
20 Reem D Christon  01/16/2013   Your procedure is scheduled on:  01/23/13  FRIDAY  Report to [facility] at   1230PM  Call this number if you have problems the morning of surgery: [phone]       Remember:   Do not eat food  :After Midnight. Thursday NIGHT---MAY HAVE CLEAR LIQUIDS Friday MORNING UNTIL 0900am, THEN NOTHING BY MOUTH   Take these medicines the morning of surgery with A SIP OF WATER:  FLUOXETINE   .  Contacts, dentures or partial plates can not be worn to surgery  Leave suitcase in the car. After surgery it may be brought to your room.  For patients admitted to the hospital, checkout time is 11:00 AM day of  discharge.             SPECIAL INSTRUCTIONS- SEE [place] PREPARING FOR SURGERY INSTRUCTION SHEET-     DO NOT WEAR JEWELRY, LOTIONS, POWDERS, OR PERFUMES.  WOMEN-- DO NOT SHAVE LEGS OR UNDERARMS FOR 12 HOURS BEFORE SHOWERS. MEN MAY SHAVE FACE.  Patients discharged the day of surgery will not be allowed to drive home. IF going home the day of surgery, you must have a driver and someone to stay with you for the first 24 hours  Name and phone number of your driver:    ADMISSION                                                                    Please read over the following fact sheets that you were given: MRSA Information, Incentive Spirometry Sheet, Blood Transfusion Sheet  Information                                                                                   Zalea Pete  PST [phone]  [phone]                 FAILURE TO FOLLOW THESE INSTRUCTIONS MAY RESULT IN  CANCELLATION   OF YOUR SURGERY                                                  Patient Signature _____________________________

## 2013-01-16 NOTE — Progress Notes (Signed)
Clearance Dr Valentina Lucks on chart

## 2013-01-18 ENCOUNTER — Other Ambulatory Visit: Payer: Self-pay | Admitting: Surgical

## 2013-01-18 NOTE — H&P (Signed)
TOTAL KNEE REVISION ADMISSION H&P  Patient is being admitted for left revision total knee arthroplasty.  Subjective:  Chief Complaint:left knee pain.  HPI: [name], 72 y.o. female, has a history of pain and functional disability in the left knee(s) due to failed previous arthroplasty and patient has failed non-surgical conservative treatments for greater than 12 weeks to include NSAID's and/or analgesics, flexibility and strengthening excercises, use of assistive devices and activity modification. The indications for the revision of the total knee arthroplasty are loosening of one or more components and fracture or mechanical failure of one or components. Onset of symptoms was gradual starting 1 year ago with gradually worsening course since that time.  Prior procedures on the left knee(s) include arthroscopy and arthroplasty.  Patient currently rates pain in the left knee(s) at 7 out of 10 with activity. There is night pain, worsening of pain with activity and weight bearing, pain that interferes with activities of daily living, pain with passive range of motion, crepitus and joint swelling.  Patient has evidence of prosthetic loosening by imaging studies. This condition presents safety issues increasing the risk of falls. There is no current active infection.  Patient Active Problem List   Diagnosis Date Noted  . Sciatic pain   . Detrusor instability   . Uterine prolapse   . Atrophic vaginitis   . Osteoporosis    Past Medical History  Diagnosis Date  . Anxiety   . Detrusor instability   . Uterine prolapse   . Atrophic vaginitis   . Sciatic pain   . Osteoporosis     Past Surgical History  Procedure Laterality Date  . Appendectomy    . Foot surgery    . Knee surgery    . Total knee arthroplasty      X2-- 2009; 2010     Current outpatient prescriptions acitretin (SORIATANE) 25 MG capsule, Take 25 mg by mouth once a week. , Disp: , Rfl: ;   calcium carbonate (OS-CAL)  600 MG TABS, Take 600 mg by mouth 2 (two) times daily with a meal., Disp: , Rfl: ;   cholecalciferol (VITAMIN D) 1000 UNITS tablet, Take 1,000 Units by mouth daily., Disp: , Rfl: ;   COD LIVER OIL PO, Take 1 capsule by mouth daily., Disp: , Rfl:  Cyanocobalamin (VITAMIN B-12 PO), Take by mouth., Disp: , Rfl: ;   FLUoxetine (PROZAC) 40 MG capsule, Take 40 mg by mouth every morning. , Disp: , Rfl: ;   Multiple Vitamin (MULTIVITAMIN) capsule, Take 1 capsule by mouth daily.  , Disp: , Rfl: ;   naproxen sodium (ANAPROX) 220 MG tablet, Take 220 mg by mouth 2 (two) times daily as needed (for pain). , Disp: , Rfl:  Estradiol .02% 1 ML Prefilled Applicator Sig: apply vaginally twice a week #90 Day Supply with 4 refills, Disp: 1 each, Rfl: 4;  valACYclovir (VALTREX) 500 MG tablet, Take 500 mg by mouth daily., Disp: , Rfl:   No Known Allergies  History  Substance Use Topics  . Smoking status: Current Every Day Smoker -- 0.20 packs/day    Types: Cigarettes  . Smokeless tobacco: Not on file  . Alcohol Use: No    Family History  Problem Relation Age of Onset  . Cancer Mother     GALLBLADDER   . Breast cancer Cousin     Mat. 1st cousin Age 58      Review of Systems  Constitutional: Negative for fever, chills, weight loss, malaise/fatigue and diaphoresis.  HENT: Negative.  Negative for neck pain.   Eyes: Negative.   Respiratory: Negative.   Cardiovascular: Negative.   Gastrointestinal: Negative.   Genitourinary: Positive for frequency. Negative for dysuria, urgency, hematuria and flank pain.  Musculoskeletal: Positive for joint pain. Negative for myalgias, back pain and falls.       Left knee pain  Skin: Negative.   Neurological: Positive for weakness. Negative for tingling, tremors, sensory change, speech change, focal weakness, seizures and loss of consciousness.  Endo/Heme/Allergies: Negative.   Psychiatric/Behavioral: Negative.      Objective:  Physical Exam  Constitutional: She is  oriented to person, place, and time. She appears well-developed and well-nourished. No distress.  HENT:  Head: Normocephalic and atraumatic.  Right Ear: External ear normal.  Left Ear: External ear normal.  Nose: Nose normal.  Mouth/Throat: Oropharynx is clear and moist.  Eyes: Conjunctivae and EOM are normal.  Neck: Normal range of motion. Neck supple. No tracheal deviation present. No thyromegaly present.  Cardiovascular: Normal rate, regular rhythm, normal heart sounds and intact distal pulses.   No murmur heard. Respiratory: Effort normal and breath sounds normal. No respiratory distress. She has no wheezes. She exhibits no tenderness.  GI: Soft. Bowel sounds are normal. She exhibits no distension and no mass. There is no tenderness.  Musculoskeletal:       Right hip: Normal.       Left hip: Normal.       Right knee: Normal.       Left knee: She exhibits decreased range of motion and swelling. She exhibits no effusion and no erythema. Tenderness found. Medial joint line and lateral joint line tenderness noted.       Right lower leg: She exhibits no tenderness and no swelling.       Left lower leg: She exhibits no tenderness and no swelling.  Her left knee shows no effusion. Her ROM is about 20 to 110 degrees. There is no instability noted about the knee. She has a varus alignment at this time.  Lymphadenopathy:    She has no cervical adenopathy.  Neurological: She is alert and oriented to person, place, and time. She has normal strength and normal reflexes. No sensory deficit.  Skin: No rash noted. She is not diaphoretic. No erythema.  Psychiatric: She has a normal mood and affect. Her behavior is normal.    Vitals Pulse: 76 (Regular) BP: 126/74 (Sitting, Left Arm, Standard)  Estimated body mass index is 29.30 kg/(m^2) as calculated from the following:   Height as of 12/24/11: 5' 7.5" (1.715 m).   Weight as of 12/29/12: 86.183 kg (190 lb).  Imaging Review Plain  radiographs demonstrate mild degenerative joint disease of the left knee(s). The overall alignment is mild varus.There is evidence of loosening of the femoral components. The bone quality appears to be fair for age and reported activity level.   Assessment/Plan:  End stage arthritis, left knee(s) with failed previous arthroplasty.   The patient history, physical examination, clinical judgment of the provider and imaging studies are consistent with end stage degenerative joint disease of the left knee(s), previous total knee arthroplasty. Revision total knee arthroplasty is deemed medically necessary. The treatment options including medical management, injection therapy, arthroscopy and revision arthroplasty were discussed at length. The risks and benefits of revision total knee arthroplasty were presented and reviewed. The risks due to aseptic loosening, infection, stiffness, patella tracking problems, thromboembolic complications and other imponderables were discussed. The patient acknowledged the explanation, agreed to proceed  with the plan and consent was signed. Patient is being admitted for inpatient treatment for surgery, pain control, PT, OT, prophylactic antibiotics, VTE prophylaxis, progressive ambulation and ADL's and discharge planning.The patient is planning to be discharged to skilled nursing facility [facility])    Dimitri Ped, [place]

## 2013-01-19 ENCOUNTER — Encounter (HOSPITAL_COMMUNITY)
Admission: RE | Admit: 2013-01-19 | Discharge: 2013-01-19 | Disposition: A | Discharge status: Home / Self Care | Payer: Medicare PPO | Source: Ambulatory Visit | Attending: Orthopedic Surgery | Admitting: Orthopedic Surgery

## 2013-01-19 ENCOUNTER — Encounter (HOSPITAL_COMMUNITY): Payer: Self-pay

## 2013-01-19 ENCOUNTER — Inpatient Hospital Stay (HOSPITAL_COMMUNITY): Admission: RE | Admit: 2013-01-19 | Payer: Medicare PPO | Source: Ambulatory Visit

## 2013-01-19 ENCOUNTER — Ambulatory Visit (HOSPITAL_COMMUNITY)
Admission: RE | Admit: 2013-01-19 | Discharge: 2013-01-19 | Disposition: A | Discharge status: Home / Self Care | Payer: Medicare PPO | Source: Ambulatory Visit | Attending: Orthopedic Surgery | Admitting: Orthopedic Surgery

## 2013-01-19 DIAGNOSIS — Z01810 Encounter for preprocedural cardiovascular examination: Secondary | ICD-10-CM | POA: Insufficient documentation

## 2013-01-19 DIAGNOSIS — Z01818 Encounter for other preprocedural examination: Secondary | ICD-10-CM | POA: Insufficient documentation

## 2013-01-19 DIAGNOSIS — Z01812 Encounter for preprocedural laboratory examination: Secondary | ICD-10-CM | POA: Insufficient documentation

## 2013-01-19 HISTORY — DX: Gastro-esophageal reflux disease without esophagitis: K21.9

## 2013-01-19 HISTORY — DX: Unspecified osteoarthritis, unspecified site: M19.90

## 2013-01-19 LAB — URINALYSIS, ROUTINE W REFLEX MICROSCOPIC
Nitrite: NEGATIVE
Specific Gravity, Urine: 1.018 (ref 1.005–1.030)
Urobilinogen, UA: 0.2 mg/dL (ref 0.0–1.0)

## 2013-01-19 LAB — APTT: aPTT: 26 seconds (ref 24–37)

## 2013-01-19 LAB — CBC
Hemoglobin: 13.5 g/dL (ref 12.0–15.0)
MCHC: 31.4 g/dL (ref 30.0–36.0)
RDW: 12.8 % (ref 11.5–15.5)

## 2013-01-19 LAB — COMPREHENSIVE METABOLIC PANEL
ALT: 15 U/L (ref 0–35)
AST: 18 U/L (ref 0–37)
Albumin: 3.7 g/dL (ref 3.5–5.2)
Alkaline Phosphatase: 84 U/L (ref 39–117)
Potassium: 4.2 mEq/L (ref 3.5–5.1)
Sodium: 140 mEq/L (ref 135–145)
Total Protein: 7.1 g/dL (ref 6.0–8.3)

## 2013-01-19 LAB — SURGICAL PCR SCREEN
MRSA, PCR: NEGATIVE
Staphylococcus aureus: NEGATIVE

## 2013-01-19 NOTE — Progress Notes (Signed)
Faxed urine with micro to Dr Lequita Halt through [facility].  Patient denies any herpes lesions. States has had "off and on" diarrhea x 1 week- no fever. States no history of c diff-  and is lactose intolerant with ice cream eaten 3 days ago.  Instructed her to have followed up with PCP as doesn't want to have an GI virus with having surgery

## 2013-01-23 ENCOUNTER — Encounter (HOSPITAL_COMMUNITY): Payer: Self-pay | Admitting: Anesthesiology

## 2013-01-23 ENCOUNTER — Encounter (HOSPITAL_COMMUNITY): Payer: Self-pay | Admitting: *Deleted

## 2013-01-23 ENCOUNTER — Inpatient Hospital Stay (HOSPITAL_COMMUNITY): Payer: Medicare PPO | Admitting: Anesthesiology

## 2013-01-23 ENCOUNTER — Inpatient Hospital Stay (HOSPITAL_COMMUNITY)
Admission: RE | Admit: 2013-01-23 | Discharge: 2013-01-26 | DRG: 468 | Disposition: A | Discharge status: Skilled Nursing Facility | Payer: Medicare PPO | Source: Ambulatory Visit | Attending: Orthopedic Surgery | Admitting: Orthopedic Surgery

## 2013-01-23 ENCOUNTER — Encounter (HOSPITAL_COMMUNITY)
Admission: RE | Disposition: A | Discharge status: Skilled Nursing Facility | Payer: Self-pay | Source: Ambulatory Visit | Attending: Orthopedic Surgery

## 2013-01-23 DIAGNOSIS — Z96659 Presence of unspecified artificial knee joint: Secondary | ICD-10-CM

## 2013-01-23 DIAGNOSIS — M1710 Unilateral primary osteoarthritis, unspecified knee: Secondary | ICD-10-CM | POA: Diagnosis present

## 2013-01-23 DIAGNOSIS — Y831 Surgical operation with implant of artificial internal device as the cause of abnormal reaction of the patient, or of later complication, without mention of misadventure at the time of the procedure: Secondary | ICD-10-CM | POA: Diagnosis present

## 2013-01-23 DIAGNOSIS — K219 Gastro-esophageal reflux disease without esophagitis: Secondary | ICD-10-CM | POA: Diagnosis present

## 2013-01-23 DIAGNOSIS — N318 Other neuromuscular dysfunction of bladder: Secondary | ICD-10-CM | POA: Diagnosis present

## 2013-01-23 DIAGNOSIS — T84039A Mechanical loosening of unspecified internal prosthetic joint, initial encounter: Principal | ICD-10-CM | POA: Diagnosis present

## 2013-01-23 DIAGNOSIS — F411 Generalized anxiety disorder: Secondary | ICD-10-CM | POA: Diagnosis present

## 2013-01-23 DIAGNOSIS — F17200 Nicotine dependence, unspecified, uncomplicated: Secondary | ICD-10-CM | POA: Diagnosis present

## 2013-01-23 DIAGNOSIS — Z79899 Other long term (current) drug therapy: Secondary | ICD-10-CM

## 2013-01-23 DIAGNOSIS — M810 Age-related osteoporosis without current pathological fracture: Secondary | ICD-10-CM | POA: Diagnosis present

## 2013-01-23 DIAGNOSIS — T84018A Broken internal joint prosthesis, other site, initial encounter: Secondary | ICD-10-CM

## 2013-01-23 DIAGNOSIS — L408 Other psoriasis: Secondary | ICD-10-CM | POA: Diagnosis present

## 2013-01-23 HISTORY — DX: Psoriasis, unspecified: L40.9

## 2013-01-23 HISTORY — PX: TOTAL KNEE REVISION: SHX996

## 2013-01-23 LAB — TYPE AND SCREEN: Antibody Screen: NEGATIVE

## 2013-01-23 SURGERY — TOTAL KNEE REVISION
Anesthesia: General | Site: Knee | Laterality: Left | Wound class: Clean

## 2013-01-23 MED ORDER — ACETAMINOPHEN 500 MG PO TABS
1000.0000 mg | ORAL_TABLET | Freq: Once | ORAL | Status: AC
  Administered 2013-01-23: 1000 mg via ORAL
  Filled 2013-01-23: qty 2

## 2013-01-23 MED ORDER — DEXAMETHASONE SODIUM PHOSPHATE 10 MG/ML IJ SOLN
10.0000 mg | Freq: Once | INTRAMUSCULAR | Status: AC
  Administered 2013-01-23: 10 mg via INTRAVENOUS

## 2013-01-23 MED ORDER — METHOCARBAMOL 500 MG PO TABS
500.0000 mg | ORAL_TABLET | Freq: Four times a day (QID) | ORAL | Status: DC | PRN
  Administered 2013-01-23 – 2013-01-26 (×2): 500 mg via ORAL
  Filled 2013-01-23 (×2): qty 1

## 2013-01-23 MED ORDER — HYDROMORPHONE HCL PF 1 MG/ML IJ SOLN
INTRAMUSCULAR | Status: DC | PRN
  Administered 2013-01-23 (×2): 1 mg via INTRAVENOUS

## 2013-01-23 MED ORDER — SODIUM CHLORIDE 0.9 % IV SOLN: INTRAVENOUS | Status: DC

## 2013-01-23 MED ORDER — SODIUM CHLORIDE 0.9 % IR SOLN
Status: DC | PRN
  Administered 2013-01-23: 1000 mL

## 2013-01-23 MED ORDER — MENTHOL 3 MG MT LOZG: 1.0000 | LOZENGE | OROMUCOSAL | Status: DC | PRN

## 2013-01-23 MED ORDER — METOCLOPRAMIDE HCL 5 MG/ML IJ SOLN: 5.0000 mg | Freq: Three times a day (TID) | INTRAMUSCULAR | Status: DC | PRN

## 2013-01-23 MED ORDER — DOCUSATE SODIUM 100 MG PO CAPS
100.0000 mg | ORAL_CAPSULE | Freq: Two times a day (BID) | ORAL | Status: DC
  Administered 2013-01-23 – 2013-01-26 (×6): 100 mg via ORAL

## 2013-01-23 MED ORDER — ROCURONIUM BROMIDE 100 MG/10ML IV SOLN
INTRAVENOUS | Status: DC | PRN
  Administered 2013-01-23: 40 mg via INTRAVENOUS

## 2013-01-23 MED ORDER — KETOROLAC TROMETHAMINE 15 MG/ML IJ SOLN
INTRAMUSCULAR | Status: AC
  Filled 2013-01-23: qty 1

## 2013-01-23 MED ORDER — LIDOCAINE HCL 4 % MT SOLN
OROMUCOSAL | Status: DC | PRN
  Administered 2013-01-23: 4 mL via TOPICAL

## 2013-01-23 MED ORDER — METHOCARBAMOL 100 MG/ML IJ SOLN
500.0000 mg | Freq: Four times a day (QID) | INTRAVENOUS | Status: DC | PRN
  Administered 2013-01-23: 500 mg via INTRAVENOUS
  Filled 2013-01-23: qty 5

## 2013-01-23 MED ORDER — PROMETHAZINE HCL 25 MG/ML IJ SOLN: 6.2500 mg | INTRAMUSCULAR | Status: DC | PRN

## 2013-01-23 MED ORDER — LACTATED RINGERS IV SOLN
INTRAVENOUS | Status: DC
  Administered 2013-01-23: 17:00:00 via INTRAVENOUS
  Administered 2013-01-23: 1000 mL via INTRAVENOUS
  Administered 2013-01-23: 16:00:00 via INTRAVENOUS

## 2013-01-23 MED ORDER — SODIUM CHLORIDE 0.9 % IJ SOLN
INTRAMUSCULAR | Status: DC | PRN
  Administered 2013-01-23: 30 mL

## 2013-01-23 MED ORDER — PROPOFOL 10 MG/ML IV BOLUS
INTRAVENOUS | Status: DC | PRN
  Administered 2013-01-23: 170 mg via INTRAVENOUS
  Administered 2013-01-23: 30 mg via INTRAVENOUS

## 2013-01-23 MED ORDER — BUPIVACAINE HCL (PF) 0.25 % IJ SOLN
INTRAMUSCULAR | Status: AC
  Filled 2013-01-23: qty 30

## 2013-01-23 MED ORDER — DEXAMETHASONE 6 MG PO TABS
10.0000 mg | ORAL_TABLET | Freq: Every day | ORAL | Status: AC
  Administered 2013-01-24: 10 mg via ORAL
  Filled 2013-01-23: qty 1

## 2013-01-23 MED ORDER — TRAMADOL HCL 50 MG PO TABS: 50.0000 mg | ORAL_TABLET | Freq: Four times a day (QID) | ORAL | Status: DC | PRN

## 2013-01-23 MED ORDER — ACETAMINOPHEN 10 MG/ML IV SOLN
1000.0000 mg | Freq: Once | INTRAVENOUS | Status: DC
  Filled 2013-01-23: qty 100

## 2013-01-23 MED ORDER — TRANEXAMIC ACID 100 MG/ML IV SOLN
1000.0000 mg | INTRAVENOUS | Status: AC
  Administered 2013-01-23: 1000 mg via INTRAVENOUS
  Filled 2013-01-23 (×2): qty 10

## 2013-01-23 MED ORDER — BUPIVACAINE HCL 0.25 % IJ SOLN
INTRAMUSCULAR | Status: DC | PRN
  Administered 2013-01-23: 20 mL

## 2013-01-23 MED ORDER — ONDANSETRON HCL 4 MG PO TABS: 4.0000 mg | ORAL_TABLET | Freq: Four times a day (QID) | ORAL | Status: DC | PRN

## 2013-01-23 MED ORDER — BUPIVACAINE LIPOSOME 1.3 % IJ SUSP
20.0000 mL | Freq: Once | INTRAMUSCULAR | Status: DC
  Filled 2013-01-23: qty 20

## 2013-01-23 MED ORDER — PHENOL 1.4 % MT LIQD: 1.0000 | OROMUCOSAL | Status: DC | PRN

## 2013-01-23 MED ORDER — SUCCINYLCHOLINE CHLORIDE 20 MG/ML IJ SOLN
INTRAMUSCULAR | Status: DC | PRN
  Administered 2013-01-23: 100 mg via INTRAVENOUS

## 2013-01-23 MED ORDER — LORAZEPAM 0.5 MG PO TABS
0.5000 mg | ORAL_TABLET | Freq: Three times a day (TID) | ORAL | Status: DC
  Administered 2013-01-23 – 2013-01-26 (×6): 0.5 mg via ORAL
  Filled 2013-01-23 (×6): qty 1

## 2013-01-23 MED ORDER — DEXTROSE-NACL 5-0.9 % IV SOLN
INTRAVENOUS | Status: DC
  Administered 2013-01-24: 04:00:00 via INTRAVENOUS

## 2013-01-23 MED ORDER — OXYCODONE HCL 5 MG PO TABS
5.0000 mg | ORAL_TABLET | ORAL | Status: DC | PRN
  Administered 2013-01-23 – 2013-01-26 (×11): 10 mg via ORAL
  Filled 2013-01-23 (×12): qty 2

## 2013-01-23 MED ORDER — MORPHINE SULFATE 2 MG/ML IJ SOLN: 1.0000 mg | INTRAMUSCULAR | Status: DC | PRN

## 2013-01-23 MED ORDER — LACTATED RINGERS IV SOLN: INTRAVENOUS | Status: DC

## 2013-01-23 MED ORDER — SUFENTANIL CITRATE 50 MCG/ML IV SOLN
INTRAVENOUS | Status: DC | PRN
  Administered 2013-01-23: 10 ug via INTRAVENOUS
  Administered 2013-01-23: 5 ug via INTRAVENOUS
  Administered 2013-01-23 (×2): 10 ug via INTRAVENOUS
  Administered 2013-01-23: 15 ug via INTRAVENOUS

## 2013-01-23 MED ORDER — METOCLOPRAMIDE HCL 10 MG PO TABS: 5.0000 mg | ORAL_TABLET | Freq: Three times a day (TID) | ORAL | Status: DC | PRN

## 2013-01-23 MED ORDER — ONDANSETRON HCL 4 MG/2ML IJ SOLN: 4.0000 mg | Freq: Four times a day (QID) | INTRAMUSCULAR | Status: DC | PRN

## 2013-01-23 MED ORDER — DEXAMETHASONE SODIUM PHOSPHATE 10 MG/ML IJ SOLN
10.0000 mg | Freq: Every day | INTRAMUSCULAR | Status: AC
  Filled 2013-01-23: qty 1

## 2013-01-23 MED ORDER — BISACODYL 10 MG RE SUPP: 10.0000 mg | Freq: Every day | RECTAL | Status: DC | PRN

## 2013-01-23 MED ORDER — LIDOCAINE HCL (CARDIAC) 20 MG/ML IV SOLN
INTRAVENOUS | Status: DC | PRN
  Administered 2013-01-23: 70 mg via INTRAVENOUS

## 2013-01-23 MED ORDER — VALACYCLOVIR HCL 500 MG PO TABS
500.0000 mg | ORAL_TABLET | Freq: Every day | ORAL | Status: DC
  Administered 2013-01-23 – 2013-01-26 (×4): 500 mg via ORAL
  Filled 2013-01-23 (×4): qty 1

## 2013-01-23 MED ORDER — DIPHENHYDRAMINE HCL 12.5 MG/5ML PO ELIX: 12.5000 mg | ORAL_SOLUTION | ORAL | Status: DC | PRN

## 2013-01-23 MED ORDER — NEOSTIGMINE METHYLSULFATE 1 MG/ML IJ SOLN
INTRAMUSCULAR | Status: DC | PRN
  Administered 2013-01-23: 2 mg via INTRAVENOUS

## 2013-01-23 MED ORDER — MIDAZOLAM HCL 5 MG/5ML IJ SOLN
INTRAMUSCULAR | Status: DC | PRN
  Administered 2013-01-23: 1 mg via INTRAVENOUS
  Administered 2013-01-23: 2 mg via INTRAVENOUS

## 2013-01-23 MED ORDER — POLYETHYLENE GLYCOL 3350 17 G PO PACK: 17.0000 g | PACK | Freq: Every day | ORAL | Status: DC | PRN

## 2013-01-23 MED ORDER — RIVAROXABAN 10 MG PO TABS
10.0000 mg | ORAL_TABLET | Freq: Every day | ORAL | Status: DC
  Administered 2013-01-24 – 2013-01-26 (×3): 10 mg via ORAL
  Filled 2013-01-23 (×4): qty 1

## 2013-01-23 MED ORDER — FLEET ENEMA 7-19 GM/118ML RE ENEM: 1.0000 | ENEMA | Freq: Once | RECTAL | Status: AC | PRN

## 2013-01-23 MED ORDER — CEFAZOLIN SODIUM 1-5 GM-% IV SOLN
1.0000 g | Freq: Four times a day (QID) | INTRAVENOUS | Status: AC
  Administered 2013-01-23 – 2013-01-24 (×2): 1 g via INTRAVENOUS
  Filled 2013-01-23 (×2): qty 50

## 2013-01-23 MED ORDER — ONDANSETRON HCL 4 MG/2ML IJ SOLN
INTRAMUSCULAR | Status: DC | PRN
  Administered 2013-01-23: 4 mg via INTRAVENOUS

## 2013-01-23 MED ORDER — ACETAMINOPHEN 500 MG PO TABS
1000.0000 mg | ORAL_TABLET | Freq: Four times a day (QID) | ORAL | Status: AC
  Administered 2013-01-23 – 2013-01-24 (×3): 1000 mg via ORAL
  Filled 2013-01-23 (×6): qty 2

## 2013-01-23 MED ORDER — DIPHENHYDRAMINE HCL 50 MG/ML IJ SOLN
INTRAMUSCULAR | Status: DC | PRN
  Administered 2013-01-23 (×2): 25 mg via INTRAVENOUS

## 2013-01-23 MED ORDER — KETOROLAC TROMETHAMINE 15 MG/ML IJ SOLN
7.5000 mg | Freq: Four times a day (QID) | INTRAMUSCULAR | Status: AC | PRN
  Administered 2013-01-23: 7.5 mg via INTRAVENOUS

## 2013-01-23 MED ORDER — FLUOXETINE HCL 20 MG PO CAPS
40.0000 mg | ORAL_CAPSULE | Freq: Every morning | ORAL | Status: DC
  Administered 2013-01-24 – 2013-01-26 (×3): 40 mg via ORAL
  Filled 2013-01-23 (×3): qty 2

## 2013-01-23 MED ORDER — GLYCOPYRROLATE 0.2 MG/ML IJ SOLN
INTRAMUSCULAR | Status: DC | PRN
  Administered 2013-01-23: 0.4 mg via INTRAVENOUS

## 2013-01-23 MED ORDER — HYDROMORPHONE HCL PF 1 MG/ML IJ SOLN: 0.2500 mg | INTRAMUSCULAR | Status: DC | PRN

## 2013-01-23 MED ORDER — CEFAZOLIN SODIUM-DEXTROSE 2-3 GM-% IV SOLR
2.0000 g | INTRAVENOUS | Status: AC
  Administered 2013-01-23: 2 g via INTRAVENOUS

## 2013-01-23 MED ORDER — BUPIVACAINE LIPOSOME 1.3 % IJ SUSP
INTRAMUSCULAR | Status: DC | PRN
  Administered 2013-01-23: 20 mL

## 2013-01-23 SURGICAL SUPPLY — 72 items
ADAPTER BOLT FEMORAL +2/-2 (Knees) ×1 IMPLANT
ADPR FEM +2/-2 OFST BOLT (Knees) ×1 IMPLANT
ADPR FEM 5D STRL KN PFC SGM (Orthopedic Implant) ×1 IMPLANT
AUG FEM SZ3 4 CMB POST STRL LF (Knees) ×2 IMPLANT
AUG FEM SZ3 4 STRL LF KN LT TI (Knees) ×1 IMPLANT
AUG FEM SZ3 8 DIST STRL KN LT (Knees) ×1 IMPLANT
AUGMENT DIST PFC 8MM (Knees) IMPLANT
BAG SPEC THK2 15X12 ZIP CLS (MISCELLANEOUS) ×1
BAG ZIPLOCK 12X15 (MISCELLANEOUS) ×2 IMPLANT
BANDAGE ELASTIC 6 VELCRO ST LF (GAUZE/BANDAGES/DRESSINGS) ×1 IMPLANT
BANDAGE ESMARK 6X9 LF (GAUZE/BANDAGES/DRESSINGS) ×1 IMPLANT
BLADE SAG 18X100X1.27 (BLADE) ×2 IMPLANT
BLADE SAW SGTL 11.0X1.19X90.0M (BLADE) ×2 IMPLANT
BNDG CMPR 9X6 STRL LF SNTH (GAUZE/BANDAGES/DRESSINGS) ×1
BNDG ESMARK 6X9 LF (GAUZE/BANDAGES/DRESSINGS) ×2
BONE CEMENT GENTAMICIN (Cement) ×6 IMPLANT
CEMENT BONE GENTAMICIN 40 (Cement) IMPLANT
CLOTH BEACON ORANGE TIMEOUT ST (SAFETY) ×2 IMPLANT
CONT SPECI 4OZ STER CLIK (MISCELLANEOUS) ×1 IMPLANT
CUFF TOURN SGL QUICK 34 (TOURNIQUET CUFF) ×2
CUFF TRNQT CYL 34X4X40X1 (TOURNIQUET CUFF) ×1 IMPLANT
DISAL AUG PFC 8MM (Knees) ×2 IMPLANT
DISTAL WEDGE PFC 4MM LEFT (Knees) ×2 IMPLANT
DRAPE EXTREMITY T 121X128X90 (DRAPE) ×1 IMPLANT
DRAPE POUCH INSTRU U-SHP 10X18 (DRAPES) ×2 IMPLANT
DRAPE U-SHAPE 47X51 STRL (DRAPES) ×2 IMPLANT
DRSG ADAPTIC 3X8 NADH LF (GAUZE/BANDAGES/DRESSINGS) ×1 IMPLANT
DRSG PAD ABDOMINAL 8X10 ST (GAUZE/BANDAGES/DRESSINGS) ×1 IMPLANT
DURAPREP 26ML APPLICATOR (WOUND CARE) ×2 IMPLANT
ELECT REM PT RETURN 9FT ADLT (ELECTROSURGICAL) ×2
ELECTRODE REM PT RTRN 9FT ADLT (ELECTROSURGICAL) ×1 IMPLANT
EVACUATOR 1/8 PVC DRAIN (DRAIN) ×2 IMPLANT
FACESHIELD LNG OPTICON STERILE (SAFETY) ×10 IMPLANT
FEM TC3 PFC SZ3 LEFT (Orthopedic Implant) ×2 IMPLANT
FEMORAL ADAPTER (Orthopedic Implant) ×1 IMPLANT
FEMORAL TC3 PFC SZ3 LEFT (Orthopedic Implant) IMPLANT
GLOVE BIO SURGEON STRL SZ7.5 (GLOVE) ×2 IMPLANT
GLOVE BIO SURGEON STRL SZ8 (GLOVE) ×2 IMPLANT
GLOVE BIOGEL PI IND STRL 8 (GLOVE) ×2 IMPLANT
GLOVE BIOGEL PI INDICATOR 8 (GLOVE) ×2
GLOVE SURG SS PI 6.5 STRL IVOR (GLOVE) ×4 IMPLANT
GOWN STRL NON-REIN LRG LVL3 (GOWN DISPOSABLE) ×4 IMPLANT
GOWN STRL REIN XL XLG (GOWN DISPOSABLE) ×2 IMPLANT
HANDPIECE INTERPULSE COAX TIP (DISPOSABLE) ×2
IMMOBILIZER KNEE 20 (SOFTGOODS)
IMMOBILIZER KNEE 20 THIGH 36 (SOFTGOODS) ×1 IMPLANT
INSERT TIBIAL PFC 3 17.5 (Knees) ×1 IMPLANT
KIT BASIN OR (CUSTOM PROCEDURE TRAY) ×2 IMPLANT
MANIFOLD NEPTUNE II (INSTRUMENTS) ×2 IMPLANT
NS IRRIG 1000ML POUR BTL (IV SOLUTION) ×2 IMPLANT
PACK TOTAL JOINT (CUSTOM PROCEDURE TRAY) ×2 IMPLANT
PAD ABD 7.5X8 STRL (GAUZE/BANDAGES/DRESSINGS) ×1 IMPLANT
PADDING CAST ABS 6INX4YD NS (CAST SUPPLIES) ×2
PADDING CAST ABS COTTON 6X4 NS (CAST SUPPLIES) IMPLANT
PADDING CAST COTTON 6X4 STRL (CAST SUPPLIES) ×4 IMPLANT
POSITIONER SURGICAL ARM (MISCELLANEOUS) ×2 IMPLANT
POST AVE PFC 4MM (Knees) ×2 IMPLANT
SET HNDPC FAN SPRY TIP SCT (DISPOSABLE) ×1 IMPLANT
SPONGE GAUZE 4X4 12PLY (GAUZE/BANDAGES/DRESSINGS) ×2 IMPLANT
STAPLER VISISTAT 35W (STAPLE) ×1 IMPLANT
STEM FLUTED UNIV REV 75X20 (Stem) ×1 IMPLANT
SUCTION FRAZIER 12FR DISP (SUCTIONS) ×2 IMPLANT
SUT VIC AB 2-0 CT1 27 (SUTURE) ×6
SUT VIC AB 2-0 CT1 TAPERPNT 27 (SUTURE) ×3 IMPLANT
SWAB COLLECTION DEVICE MRSA (MISCELLANEOUS) ×1 IMPLANT
TOWEL OR 17X26 10 PK STRL BLUE (TOWEL DISPOSABLE) ×4 IMPLANT
TOWER CARTRIDGE SMART MIX (DISPOSABLE) ×2 IMPLANT
TRAY FOLEY CATH 14FRSI W/METER (CATHETERS) ×2 IMPLANT
TUBE ANAEROBIC SPECIMEN COL (MISCELLANEOUS) ×1 IMPLANT
WATER STERILE IRR 1500ML POUR (IV SOLUTION) ×3 IMPLANT
WEDGE DISTAL PFC LEFT 4MM (Knees) IMPLANT
WRAP KNEE MAXI GEL POST OP (GAUZE/BANDAGES/DRESSINGS) ×4 IMPLANT

## 2013-01-23 NOTE — Transfer of Care (Signed)
Immediate Anesthesia Transfer of Care Note  Patient: [name]  Procedure(s) Performed: Procedure(s):  TOTAL KNEE ARTHROPLASTY REVISION  (Left)  Patient Location: PACU  Anesthesia Type:General  Level of Consciousness: awake, alert , oriented and patient cooperative  Airway & Oxygen Therapy: Patient Spontanous Breathing and Patient connected to face mask oxygen  Post-op Assessment: Report given to PACU RN, Post -op Vital signs reviewed and stable and Patient moving all extremities X 4  Post vital signs: stable  Complications: No apparent anesthesia complications

## 2013-01-23 NOTE — Brief Op Note (Signed)
01/23/2013  4:47 PM  PATIENT:  [name]  72 y.o. female  PRE-OPERATIVE DIAGNOSIS:  LOOSE  FEMORAL COMPONENT OF LEFT TOTAL KNEE ARTHROPLASTY  POST-OPERATIVE DIAGNOSIS:   LOOSE  FEMORAL COMPONENT OF LEFT TOTAL KNEE ARTHROPLASTY  PROCEDURE:  LEFT FEMORAL REVISION OF TOTAL KNEE ARTHROPLASTY  SURGEON:  Surgeon(s) and Role:    * Loanne Drilling, MD - Primary  PHYSICIAN ASSISTANT:   ASSISTANTS: [name], PA-C   ANESTHESIA:   general  EBL:  Total I/O In: 1000 [I.V.:1000] Out: 200 [Urine:100; Blood:100]  BLOOD ADMINISTERED:none  DRAINS: (Medium) Hemovact drain(s) in the left knee with  Suction Open   LOCAL MEDICATIONS USED:  OTHER Exparel  COUNTS:  YES  TOURNIQUET:   Total Tourniquet Time Documented: Thigh (Left) - 55 minutes Total: Thigh (Left) - 55 minutes   DICTATION: .Other Dictation: Dictation Number [id number]  PLAN OF CARE: Admit to inpatient   PATIENT DISPOSITION:  PACU - hemodynamically stable.

## 2013-01-23 NOTE — Anesthesia Preprocedure Evaluation (Signed)
Anesthesia Evaluation  Patient identified by MRN, date of birth, ID band Patient awake    Reviewed: Allergy & Precautions, H&P , NPO status , Patient's Chart, lab work & pertinent test results  Airway Mallampati: II TM Distance: >3 FB     Dental  (+) Teeth Intact, Dental Advisory Given and Partial Upper   Pulmonary neg pulmonary ROS, Current Smoker,  breath sounds clear to auscultation  Pulmonary exam normal       Cardiovascular negative cardio ROS  Rhythm:Regular Rate:Normal     Neuro/Psych Anxiety  Neuromuscular disease negative psych ROS   GI/Hepatic Neg liver ROS, GERD-  ,  Endo/Other  negative endocrine ROS  Renal/GU negative Renal ROS  negative genitourinary   Musculoskeletal negative musculoskeletal ROS (+)   Abdominal   Peds  Hematology negative hematology ROS (+)   Anesthesia Other Findings   Reproductive/Obstetrics                           Anesthesia Physical Anesthesia Plan  ASA: II  Anesthesia Plan: General   Post-op Pain Management:    Induction: Intravenous  Airway Management Planned: Oral ETT  Additional Equipment:   Intra-op Plan:   Post-operative Plan: Extubation in OR  Informed Consent: I have reviewed the patients History and Physical, chart, labs and discussed the procedure including the risks, benefits and alternatives for the proposed anesthesia with the patient or authorized representative who has indicated his/her understanding and acceptance.   Dental advisory given  Plan Discussed with: CRNA  Anesthesia Plan Comments:         Anesthesia Quick Evaluation

## 2013-01-23 NOTE — Interval H&P Note (Signed)
History and Physical Interval Note:  01/23/2013 2:50 PM  [name]  has presented today for surgery, with the diagnosis of LOOSE LEFT TOTAL KNEE ARTHROPLASTY  The various methods of treatment have been discussed with the patient and family. After consideration of risks, benefits and other options for treatment, the patient has consented to  Procedure(s): LEFT KNEE FEMORAL VERSES TOTAL KNEE ARTHROPLASTY REVISION  (Left) as a surgical intervention .  The patient's history has been reviewed, patient examined, no change in status, stable for surgery.  I have reviewed the patient's chart and labs.  Questions were answered to the patient's satisfaction.     Loanne Drilling

## 2013-01-24 LAB — CBC
HCT: 37.1 % (ref 36.0–46.0)
MCHC: 31.8 g/dL (ref 30.0–36.0)
RDW: 12.8 % (ref 11.5–15.5)
WBC: 10.2 10*3/uL (ref 4.0–10.5)

## 2013-01-24 LAB — BASIC METABOLIC PANEL
Chloride: 102 mEq/L (ref 96–112)
GFR calc Af Amer: >90 mL/min (ref >90)
GFR calc non Af Amer: 86 mL/min — ABNORMAL LOW (ref >90)
Potassium: 4.1 mEq/L (ref 3.5–5.1)
Sodium: 137 mEq/L (ref 135–145)

## 2013-01-24 NOTE — Anesthesia Postprocedure Evaluation (Signed)
Anesthesia Post Note  Patient: [name]  Procedure(s) Performed: Procedure(s) (LRB):  TOTAL KNEE ARTHROPLASTY REVISION  (Left)  Anesthesia type: General  Patient location: PACU  Post pain: Pain level controlled  Post assessment: Post-op Vital signs reviewed  Last Vitals:  Filed Vitals:   01/24/13 0630  BP: 145/74  Pulse: 73  Temp: 36.5 C  Resp: 18    Post vital signs: Reviewed  Level of consciousness: sedated  Complications: No apparent anesthesia complications

## 2013-01-24 NOTE — Progress Notes (Signed)
Subjective: 1 Day Post-Op Procedure(s) (LRB):  TOTAL KNEE ARTHROPLASTY REVISION  (Left) Patient reports pain as mild.  Well controlled.  No n/v/f/c.  Objective: Vital signs in last 24 hours: Temp:  [97.3 F (36.3 C)-98.7 F (37.1 C)] 97.7 F (36.5 C) (08/09 0630) Pulse Rate:  [61-85] 73 (08/09 0630) Resp:  [11-26] 18 (08/09 0800) BP: (134-188)/(63-95) 145/74 mmHg (08/09 0630) SpO2:  [92 %-100 %] 100 % (08/09 0800)  Intake/Output from previous day: 08/08 0701 - 08/09 0700 In: 3700 [P.O.:600; I.V.:3100] Out: 2455 [Urine:2025; Drains:330; Blood:100] Intake/Output this shift: Total I/O In: 240 [P.O.:240] Out: 410 [Urine:375; Drains:35]   Recent Labs  01/24/13 0416  HGB 11.8*    Recent Labs  01/24/13 0416  WBC 10.2  RBC 4.18  HCT 37.1  PLT 162    Recent Labs  01/24/13 0416  NA 137  K 4.1  CL 102  CO2 29  BUN 12  CREATININE 0.66  GLUCOSE 120*  CALCIUM 8.9   No results found for this basename: LABPT, INR,  in the last 72 hours  PE:  wound dressed andd ry.  NVI at left foot.  Assessment/Plan: 1 Day Post-Op Procedure(s) (LRB):  TOTAL KNEE ARTHROPLASTY REVISION  (Left) Drain removed.  OOB with PT.  OT consult.  Toni Arthurs 01/24/2013, 10:40 AM

## 2013-01-24 NOTE — Progress Notes (Signed)
Clinical Social Work Department CLINICAL SOCIAL WORK PLACEMENT NOTE 01/24/2013  Patient:  [name], [name]  Account Number:  [id number] Admit date:  01/23/2013  Clinical Social Worker:  Doroteo Glassman  Date/time:  01/24/2013 01:06 PM  Clinical Social Work is seeking post-discharge placement for this patient at the following level of care:   SKILLED NURSING   (*CSW will update this form in Epic as items are completed)   01/24/2013  Patient/family provided with [facility] Department of Clinical Social Work's list of facilities offering this level of care within the geographic area requested by the patient (or if unable, by the patient's family).  01/24/2013  Patient/family informed of their freedom to choose among providers that offer the needed level of care, that participate in Medicare, Medicaid or managed care program needed by the patient, have an available bed and are willing to accept the patient.  01/24/2013  Patient/family informed of [facility]' ownership interest in [facility], as well as of the fact that they are under no obligation to receive care at this facility.  PASARR submitted to EDS on 05/25/09 PASARR number received from [employer] on 05/26/09  FL2 transmitted to all facilities in geographic area requested by pt/family on  01/24/2013 FL2 transmitted to all facilities within larger geographic area on   Patient informed that his/her managed care company has contracts with or will negotiate with  certain facilities, including the following:     Patient/family informed of bed offers received:   Patient chooses bed at  Physician recommends and patient chooses bed at    Patient to be transferred to  on   Patient to be transferred to facility by   The following physician request were entered in Epic:   Additional Comments:  Providence Crosby, Theresia Majors Clinical Social Work [phone]

## 2013-01-24 NOTE — Plan of Care (Signed)
Problem: Consults Goal: Diagnosis- Total Joint Replacement Outcome: Completed/Met Date Met:  01/24/13 Revision Total Knee Left

## 2013-01-24 NOTE — Evaluation (Signed)
Occupational Therapy Evaluation Patient Details Name: [name] MRN: [id number] DOB: 05/12/1941 Today's Date: 01/24/2013 Time: 4098-1191 OT Time Calculation (min): 18 min  OT Assessment / Plan / Recommendation History of present illness     Clinical Impression   Pt was admitted for L TKA.  She will benefit from skilled OT to increase safety and independence with adls with supervision to min A level goals in acute.      OT Assessment  Patient needs continued OT Services    Follow Up Recommendations  SNF    Barriers to Discharge      Equipment Recommendations  3 in 1 bedside comode    Recommendations for Other Services    Frequency  Min 2X/week    Precautions / Restrictions Precautions Precautions: Knee Required Braces or Orthoses: Knee Immobilizer - Left Restrictions Weight Bearing Restrictions: No   Pertinent Vitals/Pain LLE sore--repositioned with ice    ADL  Grooming: Teeth care;Set up Where Assessed - Grooming: Unsupported sitting Upper Body Bathing: Set up Where Assessed - Upper Body Bathing: Unsupported sitting Lower Body Bathing: Minimal assistance Where Assessed - Lower Body Bathing: Supported sit to stand Upper Body Dressing: Minimal assistance (iv) Where Assessed - Upper Body Dressing: Unsupported sitting Lower Body Dressing: Moderate assistance Where Assessed - Lower Body Dressing: Supported sit to Pharmacist, hospital: Minimal assistance Toilet Transfer Method: Stand pivot Toileting - Clothing Manipulation and Hygiene: Minimal assistance Where Assessed - Engineer, mining and Hygiene: Sit to stand from 3-in-1 or toilet Equipment Used: Rolling walker ADL Comments: introduced concepts of AE but did not use today; she had visitors waiting     OT Diagnosis: Generalized weakness  OT Problem List: Decreased strength;Decreased activity tolerance;Decreased knowledge of use of DME or AE;Pain OT Treatment Interventions: Self-care/ADL  training;DME and/or AE instruction;Patient/family education   OT Goals(Current goals can be found in the care plan section) Acute Rehab OT Goals Patient Stated Goal: get back to being independent OT Goal Formulation: With patient Time For Goal Achievement: 01/31/13 Potential to Achieve Goals: Good ADL Goals Pt Will Perform Lower Body Bathing: with supervision;with adaptive equipment;sit to/from stand Pt Will Perform Lower Body Dressing: with adaptive equipment;sit to/from stand;with min assist Pt Will Transfer to Toilet: with supervision;ambulating;bedside commode Pt Will Perform Toileting - Clothing Manipulation and hygiene: with supervision;sit to/from stand  Visit Information  Last OT Received On: 01/24/13 Assistance Needed: +1       Prior Functioning     Home Living Family/patient expects to be discharged to:: Skilled nursing facility [facility]) Living Arrangements: Spouse/significant other Prior Function Level of Independence: Independent;Independent with assistive device(s) Comments: cane at times Communication Communication: No difficulties Dominant Hand: Right         Vision/Perception     Cognition  Cognition Arousal/Alertness: Awake/alert Behavior During Therapy: WFL for tasks assessed/performed Overall Cognitive Status: Within Functional Limits for tasks assessed    Extremity/Trunk Assessment Upper Extremity Assessment Upper Extremity Assessment: Overall WFL for tasks assessed     Mobility Transfers Transfers: Sit to Stand Sit to Stand: 4: Min assist;From chair/3-in-1;With armrests Stand to Sit: 4: Min assist;To bed;With armrests Details for Transfer Assistance: cues for LE placement     Exercise     Balance     End of Session OT - End of Session Activity Tolerance: Patient tolerated treatment well Patient left: in chair;with call bell/phone within reach  GO     Rodgers Likes 01/24/2013, 12:25 PM Marica Otter,  OTR/L [phone] 01/24/2013

## 2013-01-24 NOTE — Progress Notes (Signed)
Physical Therapy Treatment Patient Details Name: [name] MRN: [id number] DOB: [birth year] Today's Date: 01/24/2013 Time: 7829-5621 PT Time Calculation (min): 25 min  PT Assessment / Plan / Recommendation  History of Present Illness     PT Comments     Follow Up Recommendations  SNF     Does the patient have the potential to tolerate intense rehabilitation     Barriers to Discharge        Equipment Recommendations  None recommended by PT    Recommendations for Other Services OT consult  Frequency 7X/week   Progress towards PT Goals Progress towards PT goals: Progressing toward goals  Plan Current plan remains appropriate    Precautions / Restrictions Precautions Precautions: Knee Required Braces or Orthoses: Knee Immobilizer - Left Knee Immobilizer - Left: Discontinue once straight leg raise with < 10 degree lag Restrictions Weight Bearing Restrictions: No   Pertinent Vitals/Pain 4/10; premed, pt declines ice packs    Mobility  Bed Mobility Bed Mobility: Sit to Supine Sit to Supine: 4: Min assist Details for Bed Mobility Assistance: cues for sequence and use of R LE to self assist Transfers Transfers: Sit to Stand;Stand to Sit Sit to Stand: 4: Min assist;3: Mod assist Stand to Sit: 4: Min assist;3: Mod assist Details for Transfer Assistance: cues for LE management and use of UEs to self assist Ambulation/Gait Ambulation/Gait Assistance: 4: Min assist Ambulation Distance (Feet): 62 Feet (and 5') Assistive device: Rolling walker Ambulation/Gait Assistance Details: cues for posture, sequence and position from RW Gait Pattern: Step-to pattern;Decreased step length - right;Decreased step length - left;Trunk flexed Stairs: No    Exercises     PT Diagnosis:    PT Problem List:   PT Treatment Interventions:     PT Goals (current goals can now be found in the care plan section) Acute Rehab PT Goals Patient Stated Goal: get back to being independent PT  Goal Formulation: With patient Time For Goal Achievement: 01/30/13 Potential to Achieve Goals: Good  Visit Information  Last PT Received On: 01/24/13 Assistance Needed: +1    Subjective Data  Patient Stated Goal: get back to being independent   Cognition  Cognition Arousal/Alertness: Awake/alert Behavior During Therapy: WFL for tasks assessed/performed Overall Cognitive Status: Within Functional Limits for tasks assessed    Balance     End of Session PT - End of Session Equipment Utilized During Treatment: Gait belt;Left knee immobilizer Activity Tolerance: Patient tolerated treatment well Patient left: in bed;with call bell/phone within reach Nurse Communication: Mobility status   GP     [name] 01/24/2013, 4:43 PM

## 2013-01-24 NOTE — Evaluation (Signed)
Physical Therapy Evaluation Patient Details Name: [name] MRN: [id number] DOB: 28-Sep-1940 Today's Date: 01/24/2013 Time: 1011-1050 PT Time Calculation (min): 39 min  PT Assessment / Plan / Recommendation History of Present Illness     Clinical Impression  Pt s/p L TKR revision presents with decreased L LE strength/ROM and post op pain limiting functional mobility.  Pt would benefit from follow up rehab at SNF level prior to return home with ltd assist.    PT Assessment  Patient needs continued PT services    Follow Up Recommendations  SNF    Does the patient have the potential to tolerate intense rehabilitation      Barriers to Discharge Decreased caregiver support Pt's spouse with health issues limiting ability to assist    Equipment Recommendations  None recommended by PT    Recommendations for Other Services OT consult   Frequency 7X/week    Precautions / Restrictions Precautions Precautions: Knee Required Braces or Orthoses: Knee Immobilizer - Left Knee Immobilizer - Left: Discontinue once straight leg raise with < 10 degree lag Restrictions Weight Bearing Restrictions: No   Pertinent Vitals/Pain 4/10; premed, ice packs provided      Mobility  Bed Mobility Bed Mobility: Supine to Sit Supine to Sit: 4: Min assist Details for Bed Mobility Assistance: cues for sequence and use of R LE to self assist Transfers Transfers: Sit to Stand;Stand to Sit Sit to Stand: 3: Mod assist Stand to Sit: 4: Min assist;3: Mod assist Details for Transfer Assistance: cues for LE management and use of UEs to self assist Ambulation/Gait Ambulation/Gait Assistance: 4: Min assist;3: Mod assist Ambulation Distance (Feet): 64 Feet Assistive device: Rolling walker Ambulation/Gait Assistance Details: cues for sequence, posture and position from RW Gait Pattern: Step-to pattern;Decreased step length - right;Decreased step length - left;Trunk flexed    Exercises Total Joint  Exercises Ankle Circles/Pumps: AROM;Both;10 reps;Supine Quad Sets: AROM;Both;10 reps;Supine Heel Slides: AAROM;10 reps;Supine;Left Hip ABduction/ADduction: AAROM;Left;10 reps;Supine   PT Diagnosis: Difficulty walking  PT Problem List: Decreased strength;Decreased range of motion;Decreased activity tolerance;Decreased mobility;Decreased knowledge of use of DME;Pain PT Treatment Interventions: DME instruction;Gait training;Stair training;Functional mobility training;Therapeutic activities;Therapeutic exercise;Patient/family education     PT Goals(Current goals can be found in the care plan section) Acute Rehab PT Goals Patient Stated Goal: get back to being independent PT Goal Formulation: With patient Time For Goal Achievement: 01/30/13 Potential to Achieve Goals: Good  Visit Information  Last PT Received On: 01/24/13 Assistance Needed: +1       Prior Functioning  Home Living Family/patient expects to be discharged to:: Skilled nursing facility [facility]) Living Arrangements: Spouse/significant other Prior Function Level of Independence: Independent;Independent with assistive device(s) Comments: cane at times Communication Communication: No difficulties Dominant Hand: Right    Cognition  Cognition Arousal/Alertness: Awake/alert Behavior During Therapy: WFL for tasks assessed/performed Overall Cognitive Status: Within Functional Limits for tasks assessed    Extremity/Trunk Assessment Upper Extremity Assessment Upper Extremity Assessment: Overall WFL for tasks assessed Lower Extremity Assessment Lower Extremity Assessment: LLE deficits/detail LLE Deficits / Details: 2/5 quads with AAROM at knee -15 - 65   Balance    End of Session PT - End of Session Equipment Utilized During Treatment: Gait belt;Left knee immobilizer Activity Tolerance: Patient tolerated treatment well Patient left: in chair;with call bell/phone within reach Nurse Communication: Mobility status  GP      [name] 01/24/2013, 12:42 PM

## 2013-01-24 NOTE — Progress Notes (Signed)
Clinical Social Work Department BRIEF PSYCHOSOCIAL ASSESSMENT 01/24/2013  Patient:  SHAJUAN, MUSSO     Account Number:  [id number]     Admit date:  01/23/2013  Clinical Social Worker:  Doroteo Glassman  Date/Time:  01/24/2013 01:03 PM  Referred by:  Physician  Date Referred:  01/24/2013 Referred for  SNF Placement   Other Referral:   Interview type:  Patient Other interview type:    PSYCHOSOCIAL DATA Living Status:  HUSBAND Admitted from facility:   Level of care:   Primary support name:  Mr. Chenard Primary support relationship to patient:  SPOUSE Degree of support available:   adequate    CURRENT CONCERNS Current Concerns  Post-Acute Placement   Other Concerns:    SOCIAL WORK ASSESSMENT / PLAN Met with Pt to discuss d/c plans.    Pt reported that she is agreeable to SNF.  She stated that she has been to [facility], by hx, but that she'd like to go to [facility].    Pt gave CSW permission to send her information to all [place] facilities.    Weekday CSW to follow up with offers.    CSW thanked Pt for her time.   Assessment/plan status:  Psychosocial Support/Ongoing Assessment of Needs Other assessment/ plan:   Information/referral to community resources:   [facility] SNF list    PATIENT'S/FAMILY'S RESPONSE TO PLAN OF CARE: Pt understands that she needs SNF and is agreeable.    Pt thanked CSW for time and assistance.   Providence Crosby, LCSWA Clinical Social Work [phone]

## 2013-01-24 NOTE — Op Note (Signed)
[name] NO.:  [id number]  MEDICAL RECORD NO.:  [id number]  LOCATION:  1605                         FACILITY:  [facility]  PHYSICIAN:  Ollen Gross, M.D.    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  PROCEDURE:  Left femoral revision.  SURGEON:  Ollen Gross, MD  ASSISTANT:  Alexzandrew L. Perkins, PA-C  ANESTHESIA:  General.  ESTIMATED BLOOD LOSS:  Minimal.  DRAINS:  Hemovac x1.  TOURNIQUET TIME:  55 minutes at 300 mmHg.  COMPLICATIONS:  None.  CONDITION:  Stable to recovery.  BRIEF CLINICAL NOTE:  [name] is a 72 year old female who had a total knee arthroplasty done a few years ago and has recently developed significant pain in the knee with a varus deformity.  X-ray showed probable loosening of the femoral component confirmed by bone scan.  She presents now for femoral versus total knee arthroplasty revision.  PROCEDURE IN DETAIL:  After successful administration of general anesthetic, a tourniquet was placed high on the left thigh and her left lower extremity was prepped and draped in the usual sterile fashion. The extremity was wrapped in Esmarch, knee flexed, and tourniquet inflated to 300 mmHg.  The midline incision was made with a 10 blade through the subcutaneous tissue to the level of the extensor mechanism. A fresh blade was used to make a medial parapatellar arthrotomy.  The soft tissue over the proximal medial tibia was subperiosteally elevated to the joint line with a knife and into the semimembranosus bursa with a Cobb elevator.  Soft tissue laterally was elevated with attention being paid to avoiding the patellar tendon on the tibial tubercle.  Patella was subluxed  laterally, knee flexed 90 degrees.  I removed the tibial polyethylene.  The tibial components in excellent position and was well fixed.  Femoral component was loose and had subsided medially kicking it into varus.  I was able to easily remove the femoral component as well as the cement.  Some fibrous tissue between the bone and the cement and that was all removed.  I then used a drill to recreate the starting hole in the distal femur.  The canal was thoroughly irrigated.  We reamed the femoral canal  with rounds of 20 mm press-fit stem.  We left the reamer intact to serve as our intramedullary cutting guide.  A 5-degree left valgus alignment guide was placed to remove about 2 mm of distal femoral surface.  I did go all the way up to a +8 in order to get bone on the medial side.  We were able to obtain bone laterally.  Thus needed 8 mm augment medially.  Size 3 was the most appropriate femoral component. We then placed the size 3 cutting block at a +2 position effectively raising the stem and lowering the flange of the femoral component down to the femur.  The rotation was marked at the epicondylar axis confirmed by creating rectangular flexion gap at 90 degrees.  The block was pinned in this rotation and we were not able to get any bone anterior.  We had to go into a +4 position posteriorly medial and lateral to get any bone, thus we needed 4 mm medial and lateral augments posteriorly.  The intercondylar block was then placed and the intercondylar cut made for a TC3 component.  The femoral trial was then placed with a trial size 3 TC3 with a 20 x 75 stem in the +2 position 5 degrees left valgus with 4 mm augments posteriorly both medial and lateral and then an 8 mm distal augment medially full and a 4 mm distal augment laterally which allowed for direct contact to the bone laterally.  The trial inserts were placed. We went to a 17.5 allowing for full extension with excellent  varus- valgus and anterior-posterior balance throughout full range of motion. The patellar component looks to be in excellent position and it was well fixed with no wear.  The trial was then removed and a permanent component was assembled on the back table which was a size 3 TC3 femur with a 20 x 75 stem extension +2 position 5 degrees left valgus with a 4- mm distal lateral augment, 8-mm distal medial augment, and a 4-mm posterior augments medial and lateral.  The components were assembled on the back table while the joint was being prepared with pulsatile lavage. The cement was then mixed and once ready for implantation the femoral component cemented distally with a press-fit stem.  The 17.5 mm trial inserts placed, knee held in full extension, and all extruded cement removed.  Once the cement fully hardened, then the permanent 17.5 mm rotating platform insert was placed into the tibial tray.  Wound was copiously irrigated with saline solution and 20 mL of Exparel mixed with 30 mL of saline injected into the extensor mechanism and periosteum of the femur.  Additional injection was performed with 20 mL of 0.25% bupivacaine into the subcu and extensor mechanism.  A Hemovac drain was placed.  The arthrotomy was closed over the drain with a running #1 V- Loc suture.  Flexion against gravity to 125 degrees.  Patella tracked normally and there was excellent stability throughout full range of motion.  The tourniquet was then released for a total tourniquet time of 55 minutes.  Subcu was then closed with interrupted 2-0 Vicryl and skin with staples.  The drains hooked to suction.  Incision cleaned and dried and a bulky sterile dressing applied.  She was then placed into a knee immobilizer, awakened, and transported to recovery in stable condition.     Ollen Gross, M.D.     FA/MEDQ  D:  01/23/2013  T:  01/24/2013  Job:  [id number]

## 2013-01-25 LAB — CBC
MCHC: 31.9 g/dL (ref 30.0–36.0)
MCV: 89.3 fL (ref 78.0–100.0)
Platelets: 151 10*3/uL (ref 150–400)
RDW: 12.8 % (ref 11.5–15.5)
WBC: 9.1 10*3/uL (ref 4.0–10.5)

## 2013-01-25 LAB — BASIC METABOLIC PANEL
Chloride: 104 mEq/L (ref 96–112)
Creatinine, Ser: 0.53 mg/dL (ref 0.50–1.10)
GFR calc Af Amer: >90 mL/min (ref >90)
GFR calc non Af Amer: >90 mL/min (ref >90)

## 2013-01-25 NOTE — Progress Notes (Signed)
Physical Therapy Treatment Patient Details Name: MARISHKA RENTFROW MRN: [id number] DOB: Apr 02, 1941 Today's Date: 01/25/2013 Time: 1140-1205 PT Time Calculation (min): 25 min  PT Assessment / Plan / Recommendation  History of Present Illness pt with limited terminal knee extension   PT Comments   Pt needs continued work on quad strength and terminal extension ROM  Follow Up Recommendations  SNF     Does the patient have the potential to tolerate intense rehabilitation     Barriers to Discharge        Equipment Recommendations  None recommended by PT    Recommendations for Other Services OT consult  Frequency 7X/week   Progress towards PT Goals Progress towards PT goals: Progressing toward goals  Plan Current plan remains appropriate    Precautions / Restrictions Precautions Precautions: Knee Required Braces or Orthoses: Knee Immobilizer - Left Knee Immobilizer - Left: Discontinue once straight leg raise with < 10 degree lag Restrictions Weight Bearing Restrictions: No   Pertinent Vitals/Pain Pt c/o pain in left leg , especially when lowering leg to floor from sitting position with legs elevated    Mobility  Transfers Transfers: Sit to Stand;Stand to Sit Sit to Stand: 4: Min assist Stand to Sit: 4: Min assist Details for Transfer Assistance: cues for LE management and use of UEs to self assist Ambulation/Gait Ambulation/Gait Assistance: 4: Min assist Ambulation Distance (Feet): 75 Feet Assistive device: Rolling walker Ambulation/Gait Assistance Details: cues for heel strike on left foot and to increase knee extension .  also cued to increase step length on right and keep trunk and hips extended Gait Pattern: Step-to pattern;Decreased step length - right;Decreased step length - left;Trunk flexed;Antalgic General Gait Details: Pt needs recurrent cues to correct gait technique.  She states she was not expecting this much pain and had a limp for a couple of years before  this revision Stairs: No Wheelchair Mobility Wheelchair Mobility: No    Exercises Total Joint Exercises Ankle Circles/Pumps: AROM;Both;10 reps;Supine Quad Sets: AROM;Both;10 reps;Supine (emphasis on terminal knee extension) Heel Slides: AAROM;10 reps;Supine;Left Hip ABduction/ADduction: AAROM;Left;10 reps;Supine Straight Leg Raises: AAROM;Left;10 reps;Seated Long Arc Quad: AAROM;Left;5 reps;Seated Knee Flexion: AROM;Left;10 reps;Seated Goniometric ROM: ~ - 20 to ~ 80   PT Diagnosis:    PT Problem List:   PT Treatment Interventions:     PT Goals (current goals can now be found in the care plan section) Acute Rehab PT Goals Patient Stated Goal: get back to being independent PT Goal Formulation: With patient Time For Goal Achievement: 01/30/13 Potential to Achieve Goals: Good  Visit Information  Last PT Received On: 01/25/13 Assistance Needed: +1 History of Present Illness: pt with limited terminal knee extension    Subjective Data  Patient Stated Goal: get back to being independent   Cognition  Cognition Arousal/Alertness: Awake/alert Behavior During Therapy: WFL for tasks assessed/performed Overall Cognitive Status: Within Functional Limits for tasks assessed    Balance     End of Session PT - End of Session Equipment Utilized During Treatment: Gait belt Activity Tolerance: Patient tolerated treatment well Patient left: with call bell/phone within reach;in chair Nurse Communication: Mobility status   GP    Bayard Hugger. Manson Passey, PT [phone] 01/25/2013, 1:05 PM

## 2013-01-25 NOTE — Care Management Note (Signed)
    Page 1 of 1   01/25/2013     2:14:48 PM   CARE MANAGEMENT NOTE 01/25/2013  Patient:  HARLOW, BASLEY   Account Number:  [id number]  Date Initiated:  01/24/2013  Documentation initiated by:  [facility]  Subjective/Objective Assessment:   72 year old female admitted s/p revision of rt knee arthroplasty.     Action/Plan:   SNF @ d/c.   Anticipated DC Date:  01/27/2013   Anticipated DC Plan:  SKILLED NURSING FACILITY  In-house referral  Clinical Social Worker      DC Planning Services  CM consult      Choice offered to / List presented to:             Status of service:  In process, will continue to follow Medicare Important Message given?  NA - LOS <3 / Initial given by admissions (If response is "NO", the following Medicare IM given date fields will be blank) Date Medicare IM given:   Date Additional Medicare IM given:    Discharge Disposition:    Per UR Regulation:  Reviewed for med. necessity/level of care/duration of stay  If discussed at Long Length of Stay Meetings, dates discussed:    Comments:  01/25/13 Freddy Kinne RN,BSN NCM WEEKEND [phone] POD#1 L TKA.PT-SNF.[facility] FOLLOWING IF D/C PLANS CHANGE TO HOME.

## 2013-01-25 NOTE — Progress Notes (Signed)
    Subjective: 2 Days Post-Op Procedure(s) (LRB):  TOTAL KNEE ARTHROPLASTY REVISION  (Left) Patient reports pain as 2 on 0-10 scale.   Denies CP or SOB.  Voiding without difficulty. Positive flatus. Objective: Vital signs in last 24 hours: Temp:  [98 F (36.7 C)-98.5 F (36.9 C)] 98 F (36.7 C) (08/10 0500) Pulse Rate:  [69-79] 69 (08/10 0500) Resp:  [16-20] 20 (08/10 0500) BP: (123-154)/(67-74) 148/74 mmHg (08/10 0500) SpO2:  [91 %-96 %] 94 % (08/10 0500) Weight:  [88 kg (194 lb 0.1 oz)] 88 kg (194 lb 0.1 oz) (08/09 1100)  Intake/Output from previous day: 08/09 0701 - 08/10 0700 In: 840 [P.O.:840] Out: 2060 [Urine:2025; Drains:35] Intake/Output this shift:    Labs:  Recent Labs  01/24/13 0416 01/25/13 0435  HGB 11.8* 11.2*    Recent Labs  01/24/13 0416 01/25/13 0435  WBC 10.2 9.1  RBC 4.18 3.93  HCT 37.1 35.1*  PLT 162 151    Recent Labs  01/24/13 0416 01/25/13 0435  NA 137 138  K 4.1 3.8  CL 102 104  CO2 29 29  BUN 12 12  CREATININE 0.66 0.53  GLUCOSE 120* 104*  CALCIUM 8.9 8.8   No results found for this basename: LABPT, INR,  in the last 72 hours  Physical Exam: Neurologically intact ABD soft Neurovascular intact Incision: dressing C/D/I and no drainage Compartment soft  Assessment/Plan: 2 Days Post-Op Procedure(s) (LRB):  TOTAL KNEE ARTHROPLASTY REVISION  (Left) Advance diet Up with therapy Dressings changed  [name] D for Dr. Venita Lick [facility] [phone] 01/25/2013, 9:06 AM

## 2013-01-26 ENCOUNTER — Encounter (HOSPITAL_COMMUNITY): Payer: Self-pay | Admitting: Orthopedic Surgery

## 2013-01-26 LAB — CBC
HCT: 36.2 % (ref 36.0–46.0)
Platelets: 154 10*3/uL (ref 150–400)
RDW: 13.1 % (ref 11.5–15.5)
WBC: 9 10*3/uL (ref 4.0–10.5)

## 2013-01-26 MED ORDER — POLYETHYLENE GLYCOL 3350 17 G PO PACK: 17.0000 g | PACK | Freq: Every day | ORAL | Status: DC | PRN

## 2013-01-26 MED ORDER — TRAMADOL HCL 50 MG PO TABS: 50.0000 mg | ORAL_TABLET | Freq: Four times a day (QID) | ORAL | Status: DC | PRN

## 2013-01-26 MED ORDER — OXYCODONE HCL 5 MG PO TABS: 5.0000 mg | ORAL_TABLET | ORAL | Status: DC | PRN

## 2013-01-26 MED ORDER — DSS 100 MG PO CAPS: 100.0000 mg | ORAL_CAPSULE | Freq: Two times a day (BID) | ORAL | Status: DC

## 2013-01-26 MED ORDER — METHOCARBAMOL 500 MG PO TABS: 500.0000 mg | ORAL_TABLET | Freq: Four times a day (QID) | ORAL | Status: DC | PRN

## 2013-01-26 MED ORDER — DIPHENHYDRAMINE HCL 12.5 MG/5ML PO ELIX: 12.5000 mg | ORAL_SOLUTION | ORAL | Status: DC | PRN

## 2013-01-26 MED ORDER — BISACODYL 10 MG RE SUPP: 10.0000 mg | Freq: Every day | RECTAL | Status: DC | PRN

## 2013-01-26 MED ORDER — RIVAROXABAN 10 MG PO TABS: 10.0000 mg | ORAL_TABLET | Freq: Every day | ORAL | Status: DC

## 2013-01-26 MED ORDER — ONDANSETRON HCL 4 MG PO TABS: 4.0000 mg | ORAL_TABLET | Freq: Four times a day (QID) | ORAL | Status: DC | PRN

## 2013-01-26 MED ORDER — METOCLOPRAMIDE HCL 5 MG PO TABS: 5.0000 mg | ORAL_TABLET | Freq: Three times a day (TID) | ORAL | Status: DC | PRN

## 2013-01-26 NOTE — Discharge Summary (Signed)
Physician Discharge Summary   Patient ID: [name] MRN: [id number] DOB/AGE: 02/02/1941 72 y.o.  Admit date: 01/23/2013 Discharge date: 01/26/2013  Primary Diagnosis:  Loose femoral component of left total knee  arthroplasty.  Admission Diagnoses:  Past Medical History  Diagnosis Date  . Anxiety   . Detrusor instability   . Uterine prolapse   . Atrophic vaginitis   . Sciatic pain   . Osteoporosis   . GERD (gastroesophageal reflux disease)   . Arthritis   . Psoriasis    Discharge Diagnoses:   Principal Problem:   Failed total knee arthroplasty  Estimated body mass index is 30.38 kg/(m^2) as calculated from the following:   Height as of this encounter: 5\' 7"  (1.702 m).   Weight as of this encounter: 88 kg (194 lb 0.1 oz).  Procedure:  Procedure(s) (LRB):  TOTAL KNEE ARTHROPLASTY REVISION  (Left)   Consults: None  HPI: [name] is a 72 year old female who had a  total knee arthroplasty done a few years ago and has recently developed  significant pain in the knee with a varus deformity. X-ray showed  probable loosening of the femoral component confirmed by bone scan. She  presents now for femoral versus total knee arthroplasty revision.  Laboratory Data: Admission on 01/23/2013  Component Date Value Range Status  . WBC 01/24/2013 10.2  4.0 - 10.5 K/uL Final  . RBC 01/24/2013 4.18  3.87 - 5.11 MIL/uL Final  . Hemoglobin 01/24/2013 11.8* 12.0 - 15.0 g/dL Final  . HCT 40/98/1191 37.1  36.0 - 46.0 % Final  . MCV 01/24/2013 88.8  78.0 - 100.0 fL Final  . MCH 01/24/2013 28.2  26.0 - 34.0 pg Final  . MCHC 01/24/2013 31.8  30.0 - 36.0 g/dL Final  . RDW 47/82/9562 12.8  11.5 - 15.5 % Final  . Platelets 01/24/2013 162  150 - 400 K/uL Final  . Sodium 01/24/2013 137  135 - 145 mEq/L Final  . Potassium 01/24/2013 4.1  3.5 - 5.1 mEq/L Final  . Chloride 01/24/2013 102  96 - 112 mEq/L Final  . CO2 01/24/2013 29  19 - 32 mEq/L Final  . Glucose, Bld 01/24/2013 120* 70  - 99 mg/dL Final  . BUN 13/01/6577 12  6 - 23 mg/dL Final  . Creatinine, Ser 01/24/2013 0.66  0.50 - 1.10 mg/dL Final  . Calcium 46/96/2952 8.9  8.4 - 10.5 mg/dL Final  . GFR calc non Af Amer 01/24/2013 86* >90 mL/min Final  . GFR calc Af Amer 01/24/2013 >90  >90 mL/min Final   Comment:                                 The eGFR has been calculated                          using the CKD EPI equation.                          This calculation has not been                          validated in all clinical                          situations.  eGFR's persistently                          <90 mL/min signify                          possible Chronic Kidney Disease.  . WBC 01/25/2013 9.1  4.0 - 10.5 K/uL Final  . RBC 01/25/2013 3.93  3.87 - 5.11 MIL/uL Final  . Hemoglobin 01/25/2013 11.2* 12.0 - 15.0 g/dL Final  . HCT 40/98/1191 35.1* 36.0 - 46.0 % Final  . MCV 01/25/2013 89.3  78.0 - 100.0 fL Final  . MCH 01/25/2013 28.5  26.0 - 34.0 pg Final  . MCHC 01/25/2013 31.9  30.0 - 36.0 g/dL Final  . RDW 47/82/9562 12.8  11.5 - 15.5 % Final  . Platelets 01/25/2013 151  150 - 400 K/uL Final  . Sodium 01/25/2013 138  135 - 145 mEq/L Final  . Potassium 01/25/2013 3.8  3.5 - 5.1 mEq/L Final  . Chloride 01/25/2013 104  96 - 112 mEq/L Final  . CO2 01/25/2013 29  19 - 32 mEq/L Final  . Glucose, Bld 01/25/2013 104* 70 - 99 mg/dL Final  . BUN 13/01/6577 12  6 - 23 mg/dL Final  . Creatinine, Ser 01/25/2013 0.53  0.50 - 1.10 mg/dL Final  . Calcium 46/96/2952 8.8  8.4 - 10.5 mg/dL Final  . GFR calc non Af Amer 01/25/2013 >90  >90 mL/min Final  . GFR calc Af Amer 01/25/2013 >90  >90 mL/min Final   Comment:                                 The eGFR has been calculated                          using the CKD EPI equation.                          This calculation has not been                          validated in all clinical                          situations.                           eGFR's persistently                          <90 mL/min signify                          possible Chronic Kidney Disease.  . WBC 01/26/2013 9.0  4.0 - 10.5 K/uL Final  . RBC 01/26/2013 4.07  3.87 - 5.11 MIL/uL Final  . Hemoglobin 01/26/2013 11.6* 12.0 - 15.0 g/dL Final  . HCT 84/13/2440 36.2  36.0 - 46.0 % Final  . MCV 01/26/2013 88.9  78.0 - 100.0 fL Final  . MCH 01/26/2013 28.5  26.0 - 34.0 pg Final  . MCHC 01/26/2013 32.0  30.0 - 36.0 g/dL Final  . RDW 04/14/2535 13.1  11.5 - 15.5 % Final  . Platelets  01/26/2013 154  150 - 400 K/uL Final  Hospital Outpatient Visit on 01/19/2013  Component Date Value Range Status  . aPTT 01/19/2013 26  24 - 37 seconds Final  . WBC 01/19/2013 6.8  4.0 - 10.5 K/uL Final  . RBC 01/19/2013 4.77  3.87 - 5.11 MIL/uL Final  . Hemoglobin 01/19/2013 13.5  12.0 - 15.0 g/dL Final  . HCT 40/98/1191 43.0  36.0 - 46.0 % Final  . MCV 01/19/2013 90.1  78.0 - 100.0 fL Final  . MCH 01/19/2013 28.3  26.0 - 34.0 pg Final  . MCHC 01/19/2013 31.4  30.0 - 36.0 g/dL Final  . RDW 47/82/9562 12.8  11.5 - 15.5 % Final  . Platelets 01/19/2013 190  150 - 400 K/uL Final  . Sodium 01/19/2013 140  135 - 145 mEq/L Final  . Potassium 01/19/2013 4.2  3.5 - 5.1 mEq/L Final  . Chloride 01/19/2013 105  96 - 112 mEq/L Final  . CO2 01/19/2013 28  19 - 32 mEq/L Final  . Glucose, Bld 01/19/2013 92  70 - 99 mg/dL Final  . BUN 13/01/6577 10  6 - 23 mg/dL Final  . Creatinine, Ser 01/19/2013 0.62  0.50 - 1.10 mg/dL Final  . Calcium 46/96/2952 9.3  8.4 - 10.5 mg/dL Final  . Total Protein 01/19/2013 7.1  6.0 - 8.3 g/dL Final  . Albumin 84/13/2440 3.7  3.5 - 5.2 g/dL Final  . AST 04/14/2535 18  0 - 37 U/L Final  . ALT 01/19/2013 15  0 - 35 U/L Final  . Alkaline Phosphatase 01/19/2013 84  39 - 117 U/L Final  . Total Bilirubin 01/19/2013 0.2* 0.3 - 1.2 mg/dL Final  . GFR calc non Af Amer 01/19/2013 88* >90 mL/min Final  . GFR calc Af Amer 01/19/2013 >90  >90 mL/min Final   Comment:                                  The eGFR has been calculated                          using the CKD EPI equation.                          This calculation has not been                          validated in all clinical                          situations.                          eGFR's persistently                          <90 mL/min signify                          possible Chronic Kidney Disease.  Marland Kitchen Prothrombin Time 01/19/2013 13.2  11.6 - 15.2 seconds Final  . INR 01/19/2013 1.02  0.00 - 1.49 Final  . ABO/RH(D) 01/19/2013 AB NEG   Final  . Antibody Screen 01/19/2013 NEG   Final  . Sample Expiration 01/19/2013 01/26/2013  Final  . Color, Urine 01/19/2013 YELLOW  YELLOW Final  . APPearance 01/19/2013 CLEAR  CLEAR Final  . Specific Gravity, Urine 01/19/2013 1.018  1.005 - 1.030 Final  . pH 01/19/2013 6.5  5.0 - 8.0 Final  . Glucose, UA 01/19/2013 NEGATIVE  NEGATIVE mg/dL Final  . Hgb urine dipstick 01/19/2013 NEGATIVE  NEGATIVE Final  . Bilirubin Urine 01/19/2013 NEGATIVE  NEGATIVE Final  . Ketones, ur 01/19/2013 NEGATIVE  NEGATIVE mg/dL Final  . Protein, ur 16/03/9603 NEGATIVE  NEGATIVE mg/dL Final  . Urobilinogen, UA 01/19/2013 0.2  0.0 - 1.0 mg/dL Final  . Nitrite 54/02/8118 NEGATIVE  NEGATIVE Final  . Leukocytes, UA 01/19/2013 MODERATE* NEGATIVE Final  . MRSA, PCR 01/19/2013 NEGATIVE  NEGATIVE Final  . Staphylococcus aureus 01/19/2013 NEGATIVE  NEGATIVE Final   Comment:                                 The Xpert SA Assay (FDA                          approved for NASAL specimens                          in patients over 102 years of age),                          is one component of                          a comprehensive surveillance                          program.  Test performance has                          been validated by [employer] for patients greater                          than or equal to 4 year old.                          It is not  intended                          to diagnose infection nor to                          guide or monitor treatment.  . Squamous Epithelial / LPF 01/19/2013 RARE  RARE Final  . WBC, UA 01/19/2013 3-6  <3 WBC/hpf Final  . Bacteria, UA 01/19/2013 RARE  RARE Final     X-Rays:Dg Chest 2 View  01/19/2013   *RADIOLOGY REPORT*  Clinical Data: Preop for left knee surgery  CHEST - 2 VIEW  Comparison: Chest x-ray of 05/19/2009  Findings: The lungs are clear but slightly hyperaerated which may indicate mild emphysema.  Mediastinal contours are stable.  The heart is mildly enlarged and stable.  There are diffuse degenerative changes throughout the  thoracic spine.  IMPRESSION:  1.  No active lung disease.  Slight hyperaeration may indicate mild emphysema. 2.  Stable mild cardiomegaly.   Original Report Authenticated By: Dwyane Dee, M.D.    EKG: Orders placed during the hospital encounter of 01/19/13  . EKG 12-LEAD  . EKG 12-LEAD     Hospital Course: [name] is a 72 y.o. who was admitted to [facility]. They were brought to the operating room on 01/23/2013 and underwent Procedure(s):  TOTAL KNEE ARTHROPLASTY REVISION .  Patient tolerated the procedure well and was later transferred to the recovery room and then to the orthopaedic floor for postoperative care.  They were given PO and IV analgesics for pain control following their surgery.  They were given 24 hours of postoperative antibiotics of  Anti-infectives   Start     Dose/Rate Route Frequency Ordered Stop   01/24/13 0600  ceFAZolin (ANCEF) IVPB 2 g/50 mL premix     2 g 100 mL/hr over 30 Minutes Intravenous On call to O.R. 01/23/13 1234 01/23/13 1515   01/23/13 2100  ceFAZolin (ANCEF) IVPB 1 g/50 mL premix     1 g 100 mL/hr over 30 Minutes Intravenous Every 6 hours 01/23/13 1828 01/24/13 0407   01/23/13 2000  valACYclovir (VALTREX) tablet 500 mg     500 mg Oral Daily 01/23/13 1828       and started on DVT prophylaxis in the form  of Xarelto.   PT and OT were ordered for total joint protocol.  Discharge planning consulted to help with postop disposition and equipment needs.  Patient had a good night on the evening of surgery.  They started to get up OOB with therapy on day one. Hemovac drain was pulled without difficulty.  Continued to work with therapy into day two.  Dressing was changed on day two and the incision was healing well.  By day three, the patient had progressed with therapy and meeting their goals.  Incision was healing well.  Patient was seen in rounds by Dr. Lequita Halt and was ready to go tot he SNF of choice.   Discharge Medications: Prior to Admission medications   Medication Sig Start Date End Date Taking? Authorizing Provider  FLUoxetine (PROZAC) 40 MG capsule Take 40 mg by mouth every morning.    Yes Historical Provider, MD  LORazepam (ATIVAN) 0.5 MG tablet Take 0.5 mg by mouth every 8 (eight) hours.   Yes Historical Provider, MD  acitretin (SORIATANE) 25 MG capsule Take 25 mg by mouth once a week.     Historical Provider, MD  bisacodyl (DULCOLAX) 10 MG suppository Place 1 suppository (10 mg total) rectally daily as needed. 01/26/13   Alexzandrew Julien Girt, PA-C  diphenhydrAMINE (BENADRYL) 12.5 MG/5ML elixir Take 5-10 mLs (12.5-25 mg total) by mouth every 4 (four) hours as needed for itching. 01/26/13   Alexzandrew Perkins, PA-C  docusate sodium 100 MG CAPS Take 100 mg by mouth 2 (two) times daily. 01/26/13   Alexzandrew Julien Girt, PA-C  methocarbamol (ROBAXIN) 500 MG tablet Take 1 tablet (500 mg total) by mouth every 6 (six) hours as needed. 01/26/13   Alexzandrew Julien Girt, PA-C  metoCLOPramide (REGLAN) 5 MG tablet Take 1-2 tablets (5-10 mg total) by mouth every 8 (eight) hours as needed (if ondansetron (ZOFRAN) ineffective.). 01/26/13   Alexzandrew Perkins, PA-C  ondansetron (ZOFRAN) 4 MG tablet Take 1 tablet (4 mg total) by mouth every 6 (six) hours as needed for nausea. 01/26/13   Alexzandrew Julien Girt, PA-C  oxyCODONE  (OXY IR/ROXICODONE) 5 MG immediate release tablet Take 1-2 tablets (5-10 mg total) by mouth every 3 (three) hours as needed. 01/26/13   Alexzandrew Perkins, PA-C  polyethylene glycol (MIRALAX / GLYCOLAX) packet Take 17 g by mouth daily as needed. 01/26/13   Alexzandrew Julien Girt, PA-C  rivaroxaban (XARELTO) 10 MG TABS tablet Take 1 tablet (10 mg total) by mouth daily with breakfast. Take Xarelto for two and a half more weeks, then discontinue Xarelto. Once the patient has completed the blood thinner regimen, then take a Baby 81 mg Aspirin daily for four more weeks. 01/26/13   Alexzandrew Perkins, PA-C  traMADol (ULTRAM) 50 MG tablet Take 1-2 tablets (50-100 mg total) by mouth every 6 (six) hours as needed (mild pain). 01/26/13   Alexzandrew Julien Girt, PA-C  valACYclovir (VALTREX) 500 MG tablet Take 500 mg by mouth daily.    Historical Provider, MD    Diet: Regular diet Activity:WBAT Follow-up:in 2 weeks Disposition - Skilled nursing facility Discharged Condition: good   Discharge Orders   Future Orders Complete By Expires     Call MD / Call 911  As directed     Comments:      If you experience chest pain or shortness of breath, CALL 911 and be transported to the hospital emergency room.  If you develope a fever above 101 F, pus (white drainage) or increased drainage or redness at the wound, or calf pain, call your surgeon's office.    Change dressing  As directed     Comments:      Change dressing daily with sterile 4 x 4 inch gauze dressing and apply TED hose. Do not submerge the incision under water.    Constipation Prevention  As directed     Comments:      Drink plenty of fluids.  Prune juice may be helpful.  You may use a stool softener, such as Colace (over the counter) 100 mg twice a day.  Use MiraLax (over the counter) for constipation as needed.    Diet - low sodium heart healthy  As directed     Discharge instructions  As directed     Comments:      Pick up stool softner and laxative  for home. Do not submerge incision under water. May shower. Continue to use ice for pain and swelling from surgery.  Take Xarelto for two and a half more weeks, then discontinue Xarelto. Once the patient has completed the blood thinner regimen, then take a Baby 81 mg Aspirin daily for four more weeks.  When discharged from the skilled rehab facility, please have the facility set up the patient's Home Health Physical Therapy prior to being released.  Also provide the patient with their medications at time of release from the facility to include their pain medication, the muscle relaxants, and their blood thinner medication.  If the patient is still at the rehab facility at time of follow up appointment, please also assist the patient in arranging follow up appointment in our office and any transportation needs.    Do not put a pillow under the knee. Place it under the heel.  As directed     Do not sit on low chairs, stoools or toilet seats, as it may be difficult to get up from low surfaces  As directed     Driving restrictions  As directed     Comments:      No driving until released by the physician.    Increase  activity slowly as tolerated  As directed     Lifting restrictions  As directed     Comments:      No lifting until released by the physician.    Patient may shower  As directed     Comments:      You may shower without a dressing once there is no drainage.  Do not wash over the wound.  If drainage remains, do not shower until drainage stops.    TED hose  As directed     Comments:      Use stockings (TED hose) for 3 weeks on both leg(s).  You may remove them at night for sleeping.    Weight bearing as tolerated  As directed         Medication List    STOP taking these medications       calcium carbonate 600 MG Tabs tablet  Commonly known as:  OS-CAL     cholecalciferol 1000 UNITS tablet  Commonly known as:  VITAMIN D     COD LIVER OIL PO     multivitamin capsule      naproxen sodium 220 MG tablet  Commonly known as:  ANAPROX     NONFORMULARY OR COMPOUNDED ITEM     VITAMIN B-12 PO      TAKE these medications       acitretin 25 MG capsule  Commonly known as:  SORIATANE  Take 25 mg by mouth once a week.     bisacodyl 10 MG suppository  Commonly known as:  DULCOLAX  Place 1 suppository (10 mg total) rectally daily as needed.     diphenhydrAMINE 12.5 MG/5ML elixir  Commonly known as:  BENADRYL  Take 5-10 mLs (12.5-25 mg total) by mouth every 4 (four) hours as needed for itching.     DSS 100 MG Caps  Take 100 mg by mouth 2 (two) times daily.     FLUoxetine 40 MG capsule  Commonly known as:  PROZAC  Take 40 mg by mouth every morning.     LORazepam 0.5 MG tablet  Commonly known as:  ATIVAN  Take 0.5 mg by mouth every 8 (eight) hours.     methocarbamol 500 MG tablet  Commonly known as:  ROBAXIN  Take 1 tablet (500 mg total) by mouth every 6 (six) hours as needed.     metoCLOPramide 5 MG tablet  Commonly known as:  REGLAN  Take 1-2 tablets (5-10 mg total) by mouth every 8 (eight) hours as needed (if ondansetron (ZOFRAN) ineffective.).     ondansetron 4 MG tablet  Commonly known as:  ZOFRAN  Take 1 tablet (4 mg total) by mouth every 6 (six) hours as needed for nausea.     oxyCODONE 5 MG immediate release tablet  Commonly known as:  Oxy IR/ROXICODONE  Take 1-2 tablets (5-10 mg total) by mouth every 3 (three) hours as needed.     polyethylene glycol packet  Commonly known as:  MIRALAX / GLYCOLAX  Take 17 g by mouth daily as needed.     rivaroxaban 10 MG Tabs tablet  Commonly known as:  XARELTO  - Take 1 tablet (10 mg total) by mouth daily with breakfast. Take Xarelto for two and a half more weeks, then discontinue Xarelto.  - Once the patient has completed the blood thinner regimen, then take a Baby 81 mg Aspirin daily for four more weeks.     traMADol 50 MG tablet  Commonly known as:  ULTRAM  Take 1-2 tablets (50-100 mg total) by  mouth every 6 (six) hours as needed (mild pain).     valACYclovir 500 MG tablet  Commonly known as:  VALTREX  Take 500 mg by mouth daily.           Follow-up Information   Follow up with Loanne Drilling, MD. Schedule an appointment as soon as possible for a visit in 2 weeks.   Contact information:   [address] [place] [address] [phone]       Signed: Patrica Duel 01/26/2013, 8:16 AM

## 2013-01-26 NOTE — Progress Notes (Signed)
Physical Therapy Treatment Patient Details Name: [name] MRN: [id number] DOB: [birth year] Today's Date: 01/26/2013 Time: 4098-1191 PT Time Calculation (min): 29 min  PT Assessment / Plan / Recommendation  History of Present Illness pt with limited terminal knee extension, pt states she was unable to fully extend L knee prior to admission   PT Comments   **Significant L quad weakness, trace muscle contraction noted with quad sets. Pt lacks 20* knee extension. Progressing with mobility, verbal cues required for sequencing, positioning in RW when walking. *  Follow Up Recommendations  SNF     Does the patient have the potential to tolerate intense rehabilitation     Barriers to Discharge        Equipment Recommendations  None recommended by PT    Recommendations for Other Services OT consult  Frequency 7X/week   Progress towards PT Goals Progress towards PT goals: Progressing toward goals  Plan Current plan remains appropriate    Precautions / Restrictions Precautions Precautions: Knee Required Braces or Orthoses: Knee Immobilizer - Left Knee Immobilizer - Left: Discontinue once straight leg raise with < 10 degree lag Restrictions Weight Bearing Restrictions: No   Pertinent Vitals/Pain **6/10 L knee with walking Ice applied, premedicated*    Mobility  Bed Mobility Bed Mobility: Supine to Sit Supine to Sit: 4: Min assist Details for Bed Mobility Assistance: cues for sequence and use of R LE to self assist, min A to support LLE Transfers Transfers: Sit to Stand;Stand to Sit Sit to Stand: 4: Min assist;From bed;With upper extremity assist;From chair/3-in-1;With armrests Stand to Sit: To chair/3-in-1;4: Min guard;With armrests Details for Transfer Assistance: cues for LE management and use of UEs to self assist Ambulation/Gait Ambulation/Gait Assistance: 4: Min guard Ambulation Distance (Feet): 80 Feet Assistive device: Rolling walker Gait Pattern: Step-to  pattern;Decreased step length - right;Decreased step length - left;Trunk flexed General Gait Details: Frequent verbal cues to increase step length of LLE, and for sequencing and positioning in RW Stairs: No    Exercises Total Joint Exercises Ankle Circles/Pumps: AROM;Both;10 reps;Supine Quad Sets: AROM;Both;10 reps;Supine (emphasis on terminal knee extension) Towel Squeeze: AROM;Both;15 reps Short Arc QuadBarbaraann Koch;Left;10 reps Heel Slides: AAROM;10 reps;Supine;Left Hip ABduction/ADduction: AAROM;Left;10 reps;Supine Straight Leg Raises: AAROM;Left;10 reps;Supine Long Arc Quad: AAROM;Left;5 reps;Seated Goniometric ROM: -20 to 65   PT Diagnosis:    PT Problem List:   PT Treatment Interventions:     PT Goals (current goals can now be found in the care plan section) Acute Rehab PT Goals Patient Stated Goal: get back to being independent, volunteering at [employer] [facility] PT Goal Formulation: With patient Time For Goal Achievement: 01/30/13 Potential to Achieve Goals: Good  Visit Information  Last PT Received On: 01/26/13 Assistance Needed: +1 History of Present Illness: pt with limited terminal knee extension, pt states she was unable to fully extend L knee prior to admission    Subjective Data  Patient Stated Goal: get back to being independent, volunteering at [employer] [facility]   Cognition  Cognition Arousal/Alertness: Awake/alert Behavior During Therapy: WFL for tasks assessed/performed Overall Cognitive Status: Within Functional Limits for tasks assessed    Balance     End of Session PT - End of Session Equipment Utilized During Treatment: Gait belt, L knee immobilizer Activity Tolerance: Patient tolerated treatment well Patient left: with call bell/phone within reach;in chair Nurse Communication: Mobility status CPM Left Knee CPM Left Knee: Off Left Knee Flexion (Degrees): 40 Left Knee Extension (Degrees): 10   GP  [name]  [name] 01/26/2013, 1:10 PM [phone]

## 2013-01-26 NOTE — Progress Notes (Signed)
   Subjective: 3 Days Post-Op Procedure(s) (LRB):  TOTAL KNEE ARTHROPLASTY REVISION  (Left) Patient reports pain as moderate.   Plan is to go Skilled nursing facility after hospital stay.  Objective: Vital signs in last 24 hours: Temp:  [97.8 F (36.6 C)-99.6 F (37.6 C)] 99.5 F (37.5 C) (08/11 0420) Pulse Rate:  [81-92] 92 (08/11 0420) Resp:  [14-20] 20 (08/11 0420) BP: (125-150)/(68-76) 150/76 mmHg (08/11 0420) SpO2:  [92 %-99 %] 92 % (08/11 0420)  Intake/Output from previous day:  Intake/Output Summary (Last 24 hours) at 01/26/13 0703 Last data filed at 01/26/13 0420  Gross per 24 hour  Intake   1280 ml  Output    800 ml  Net    480 ml    Intake/Output this shift:    Labs:  Recent Labs  01/24/13 0416 01/25/13 0435 01/26/13 0415  HGB 11.8* 11.2* 11.6*    Recent Labs  01/25/13 0435 01/26/13 0415  WBC 9.1 9.0  RBC 3.93 4.07  HCT 35.1* 36.2  PLT 151 154    Recent Labs  01/24/13 0416 01/25/13 0435  NA 137 138  K 4.1 3.8  CL 102 104  CO2 29 29  BUN 12 12  CREATININE 0.66 0.53  GLUCOSE 120* 104*  CALCIUM 8.9 8.8   No results found for this basename: LABPT, INR,  in the last 72 hours  EXAM General - Patient is Alert, Appropriate and Oriented Extremity - Neurologically intact Neurovascular intact No cellulitis present Compartment soft Dressing/Incision - clean, dry, no drainage Motor Function - intact, moving foot and toes well on exam.   Past Medical History  Diagnosis Date  . Anxiety   . Detrusor instability   . Uterine prolapse   . Atrophic vaginitis   . Sciatic pain   . Osteoporosis   . GERD (gastroesophageal reflux disease)   . Arthritis   . Psoriasis     Assessment/Plan: 3 Days Post-Op Procedure(s) (LRB):  TOTAL KNEE ARTHROPLASTY REVISION  (Left) Principal Problem:   Failed total knee arthroplasty   Discharge to SNF  DVT Prophylaxis - Xarelto Weight-Bearing as tolerated to left leg  [name] V 01/26/2013, 7:03  AM

## 2013-01-26 NOTE — Progress Notes (Signed)
[employer] has provided authorization for SNF placement at [facility]. [facility] has been contacted for transport. Pt/family are aware that transport may not be covered by insurance.  Cori Razor LCSW

## 2013-01-26 NOTE — Progress Notes (Signed)
Patient transfer to [facility] for rehab via ambulance service. Ambulatory with walker. Immobilizer on lle. Po pain med accepted at 1928 for c/o lt knee pain with ambulation from bathroom. Pain relief verbalized. Transfer via stretcher.

## 2013-01-27 ENCOUNTER — Non-Acute Institutional Stay (SKILLED_NURSING_FACILITY): Payer: Medicare PPO | Admitting: Adult Health

## 2013-01-27 DIAGNOSIS — K5900 Constipation, unspecified: Secondary | ICD-10-CM

## 2013-01-27 DIAGNOSIS — L409 Psoriasis, unspecified: Secondary | ICD-10-CM

## 2013-01-27 DIAGNOSIS — F411 Generalized anxiety disorder: Secondary | ICD-10-CM

## 2013-01-27 DIAGNOSIS — F419 Anxiety disorder, unspecified: Secondary | ICD-10-CM

## 2013-01-27 DIAGNOSIS — L408 Other psoriasis: Secondary | ICD-10-CM

## 2013-01-27 DIAGNOSIS — T84018D Broken internal joint prosthesis, other site, subsequent encounter: Secondary | ICD-10-CM

## 2013-01-27 DIAGNOSIS — F329 Major depressive disorder, single episode, unspecified: Secondary | ICD-10-CM

## 2013-01-27 DIAGNOSIS — Z5189 Encounter for other specified aftercare: Secondary | ICD-10-CM

## 2013-01-27 NOTE — Progress Notes (Signed)
Utilization review completed.  

## 2013-01-27 NOTE — Progress Notes (Signed)
Clinical Social Work Department CLINICAL SOCIAL WORK PLACEMENT NOTE 01/27/2013  Patient:  DANEEN, VOLCY  Account Number:  [id number] Admit date:  01/23/2013  Clinical Social Worker:  Doroteo Glassman  Date/time:  01/24/2013 01:06 PM  Clinical Social Work is seeking post-discharge placement for this patient at the following level of care:   SKILLED NURSING   (*CSW will update this form in Epic as items are completed)   01/24/2013  Patient/family provided with [facility] Department of Clinical Social Work's list of facilities offering this level of care within the geographic area requested by the patient (or if unable, by the patient's family).  01/24/2013  Patient/family informed of their freedom to choose among providers that offer the needed level of care, that participate in Medicare, Medicaid or managed care program needed by the patient, have an available bed and are willing to accept the patient.  01/24/2013  Patient/family informed of [facility]' ownership interest in [facility], as well as of the fact that they are under no obligation to receive care at this facility.  PASARR submitted to EDS on  PASARR number received from EDS on   FL2 transmitted to all facilities in geographic area requested by pt/family on  01/24/2013 FL2 transmitted to all facilities within larger geographic area on   Patient informed that his/her managed care company has contracts with or will negotiate with  certain facilities, including the following:     Patient/family informed of bed offers received:  01/23/2013 Patient chooses bed at [facility] Physician recommends and patient chooses bed at    Patient to be transferred to [facility] on  01/26/2013 Patient to be transferred to facility by [facility]  The following physician request were entered in Epic:   Additional Comments: [facility] provided prior authorization for SNF placement.  Cori Razor LCSW  [phone]

## 2013-01-29 ENCOUNTER — Other Ambulatory Visit: Payer: Self-pay | Admitting: Geriatric Medicine

## 2013-01-29 MED ORDER — LORAZEPAM 0.5 MG PO TABS: 0.5000 mg | ORAL_TABLET | Freq: Three times a day (TID) | ORAL | Status: DC

## 2013-01-29 MED ORDER — OXYCODONE HCL 5 MG PO TABS: ORAL_TABLET | ORAL | Status: DC

## 2013-02-04 ENCOUNTER — Non-Acute Institutional Stay (SKILLED_NURSING_FACILITY): Payer: Medicare PPO | Admitting: Internal Medicine

## 2013-02-04 DIAGNOSIS — K5900 Constipation, unspecified: Secondary | ICD-10-CM

## 2013-02-04 DIAGNOSIS — M25569 Pain in unspecified knee: Secondary | ICD-10-CM

## 2013-02-04 DIAGNOSIS — F329 Major depressive disorder, single episode, unspecified: Secondary | ICD-10-CM

## 2013-02-04 DIAGNOSIS — M25562 Pain in left knee: Secondary | ICD-10-CM

## 2013-02-04 DIAGNOSIS — F419 Anxiety disorder, unspecified: Secondary | ICD-10-CM

## 2013-02-04 DIAGNOSIS — F411 Generalized anxiety disorder: Secondary | ICD-10-CM

## 2013-02-05 ENCOUNTER — Non-Acute Institutional Stay (SKILLED_NURSING_FACILITY): Payer: Medicare PPO | Admitting: Adult Health

## 2013-02-05 ENCOUNTER — Encounter: Payer: Self-pay | Admitting: Adult Health

## 2013-02-05 DIAGNOSIS — F329 Major depressive disorder, single episode, unspecified: Secondary | ICD-10-CM | POA: Insufficient documentation

## 2013-02-05 DIAGNOSIS — K5900 Constipation, unspecified: Secondary | ICD-10-CM

## 2013-02-05 DIAGNOSIS — L409 Psoriasis, unspecified: Secondary | ICD-10-CM | POA: Insufficient documentation

## 2013-02-05 DIAGNOSIS — F419 Anxiety disorder, unspecified: Secondary | ICD-10-CM

## 2013-02-05 DIAGNOSIS — Z5189 Encounter for other specified aftercare: Secondary | ICD-10-CM

## 2013-02-05 DIAGNOSIS — F32A Depression, unspecified: Secondary | ICD-10-CM | POA: Insufficient documentation

## 2013-02-05 DIAGNOSIS — L408 Other psoriasis: Secondary | ICD-10-CM

## 2013-02-05 DIAGNOSIS — T84018D Broken internal joint prosthesis, other site, subsequent encounter: Secondary | ICD-10-CM

## 2013-02-05 DIAGNOSIS — F411 Generalized anxiety disorder: Secondary | ICD-10-CM

## 2013-02-05 NOTE — Progress Notes (Signed)
Patient ID: [name], female   DOB: Aug 22, 1940, 72 y.o.   MRN: [id number]       PROGRESS NOTE  DATE: 01/27/2013  FACILITY:  [facility]  LEVEL OF CARE: SNF (31)  Acute Visit  CHIEF COMPLAINT:  Follow-up hospitalization  HISTORY OF PRESENT ILLNESS:  This is a 72 year old female who has been admitted to [facility] on 01/26/13 from [facility] with failed left total knee arthroplasty S/P revision. She has been admitted for a short-term rehabilitation.   PAST MEDICAL HISTORY : Reviewed.  No changes.  CURRENT MEDICATIONS: Reviewed per [facility]  REVIEW OF SYSTEMS:  GENERAL: no change in appetite, no fatigue, no weight changes, no fever, chills or weakness RESPIRATORY: no cough, SOB, DOE,, wheezing, hemoptysis CARDIAC: no chest pain, edema or palpitations GI: no abdominal pain, diarrhea, constipation, heart burn, nausea or vomiting  PHYSICAL EXAMINATION  VS:  T99.7        P86       RR18       BP128/70      POX98 %       WT203.6 (Lb)  GENERAL: no acute distress, normal body habitus EYES: conjunctivae normal, sclerae normal, normal eye lids NECK: supple, trachea midline, no neck masses, no thyroid tenderness, no thyromegaly LYMPHATICS: no LAN in the neck, no supraclavicular LAN RESPIRATORY: breathing is even & unlabored, BS CTAB CARDIAC: RRR, no murmur,no extra heart sounds, no edema GI: abdomen soft, normal BS, no masses, no tenderness, no hepatomegaly, no splenomegaly PSYCHIATRIC: the patient is alert & oriented to person, affect & behavior appropriate  LABS/RADIOLOGY: 01/24/13 WBC 10.2 hemoglobin 11.8 hematocrit 37.1 BUN 12 creatinine 0.66 sodium 137  potassium 4.1 calcium 8.9    ASSESSMENT/PLAN:  Failed left total knee arthroplasty status post left total knee arthroplasty revision - for PT and OT  Psoriasis - stable; continue Soriatane  Depression - stable  Constipation - no complaints      CPT CODE: 63875

## 2013-02-05 NOTE — Progress Notes (Signed)
Patient ID: [name], female   DOB: 07-Mar-1941, 72 y.o.   MRN: [id number]        PROGRESS NOTE  DATE: 02/05/2013   FACILITY: [facility]  LEVEL OF CARE: SNF (31)    CHIEF COMPLAINT:  Discharge Visit  HISTORY OF PRESENT ILLNESS: This is a 72 year old female who is for discharge home with home health PT, OT and nursing. She has been admitted to [facility] on 01/26/13 from [facility] with failed left total knee arthroplasty S/P revision. Patient was admitted to this facility for short-term rehabilitation after the patient's recent hospitalization.  Patient has completed SNF rehabilitation and therapy has cleared the patient for discharge.  Reassessment of ongoing problem(s):  ANXIETY: The anxiety remains stable. Patient denies ongoing anxiety or irritability. No complications reported from the medications currently being used.  DEPRESSION: The depression remains stable. Patient denies ongoing feelings of sadness, insomnia, anedhonia or lack of appetite. No complications reported from the medications currently being used. Staff do not report behavioral problems.  PAST MEDICAL HISTORY : Reviewed.  No changes.  CURRENT MEDICATIONS: Reviewed per [facility]  REVIEW OF SYSTEMS:  GENERAL: no change in appetite, no fatigue, no weight changes, no fever, chills or weakness RESPIRATORY: no cough, SOB, DOE, wheezing, hemoptysis CARDIAC: no chest pain, edema or palpitations GI: no abdominal pain, diarrhea, constipation, heart burn, nausea or vomiting  PHYSICAL EXAMINATION  VS:  T 98.3       P 71       RR 18      BP 103/60           WT 204.2 (Lb)  GENERAL: no acute distress, normal body habitus EYES: conjunctivae normal, sclerae normal, normal eye lids NECK: supple, trachea midline, no neck masses, no thyroid tenderness, no thyromegaly LYMPHATICS: no LAN in the neck, no supraclavicular LAN RESPIRATORY: breathing is even & unlabored, BS CTAB CARDIAC: RRR, no  murmur,no extra heart sounds, no edema GI: abdomen soft, normal BS, no masses, no tenderness, no hepatomegaly, no splenomegaly PSYCHIATRIC: the patient is alert & oriented to person, affect & behavior appropriate  LABS/RADIOLOGY: 01/24/13 WBC 10.2 hemoglobin 11.8 hematocrit 37.1 BUN 12 creatinine 0.66 sodium 137  potassium 4.1 calcium 8.9   ASSESSMENT/PLAN:  Failed left total knee arthroplasty status post left total knee arthroplasty revision - for home health PT, Nursing and OT  Psoriasis - stable; continue Soriatane  Depression - stable  Constipation - no complaints   Anxiety - stable   I have filled out patient's discharge paperwork and written prescriptions.  Patient will receive home health PT, OT, ST and Nursing.    Total discharge time: Less than 30 minutes Discharge time involved coordination of the discharge process with Child psychotherapist, nursing staff and therapy department. Medical justification for home health services verified.  CPT CODE: 60630

## 2013-02-18 ENCOUNTER — Ambulatory Visit: Payer: Medicare PPO | Admitting: Physical Therapy

## 2013-02-19 ENCOUNTER — Ambulatory Visit: Payer: Medicare PPO | Attending: Orthopedic Surgery

## 2013-02-19 DIAGNOSIS — M25669 Stiffness of unspecified knee, not elsewhere classified: Secondary | ICD-10-CM | POA: Insufficient documentation

## 2013-02-19 DIAGNOSIS — R609 Edema, unspecified: Secondary | ICD-10-CM | POA: Insufficient documentation

## 2013-02-19 DIAGNOSIS — Z96659 Presence of unspecified artificial knee joint: Secondary | ICD-10-CM | POA: Insufficient documentation

## 2013-02-19 DIAGNOSIS — IMO0001 Reserved for inherently not codable concepts without codable children: Secondary | ICD-10-CM | POA: Insufficient documentation

## 2013-02-19 DIAGNOSIS — M25569 Pain in unspecified knee: Secondary | ICD-10-CM | POA: Insufficient documentation

## 2013-02-19 DIAGNOSIS — R262 Difficulty in walking, not elsewhere classified: Secondary | ICD-10-CM | POA: Insufficient documentation

## 2013-02-26 ENCOUNTER — Ambulatory Visit: Payer: Medicare PPO

## 2013-02-27 ENCOUNTER — Ambulatory Visit: Payer: Medicare PPO

## 2013-03-02 ENCOUNTER — Ambulatory Visit: Payer: Medicare PPO | Admitting: Physical Therapy

## 2013-03-03 ENCOUNTER — Ambulatory Visit: Payer: Medicare PPO

## 2013-03-05 ENCOUNTER — Ambulatory Visit: Payer: Medicare PPO

## 2013-03-05 DIAGNOSIS — M25569 Pain in unspecified knee: Secondary | ICD-10-CM | POA: Insufficient documentation

## 2013-03-05 NOTE — Progress Notes (Signed)
Patient ID: [name], female   DOB: 03-30-1941, 72 y.o.   MRN: [id number]        HISTORY & PHYSICAL  DATE: 02/04/2013   FACILITY: [facility]  LEVEL OF CARE: SNF (31)  ALLERGIES:  No Known Allergies  CHIEF COMPLAINT:  Manage left knee pain, constipation, and depression.    HISTORY OF PRESENT ILLNESS:  The patient is a 72 year-old, African-American female.   LEFT KNEE PAIN:  The patient had a total knee arthroplasty to the left knee several years ago, but recently there had been significant pain in the knee with varus deformity.  X-ray showed probable loosening of the femoral component, confirmed by bone scan.   The patient, therefore, underwent femoral versus total knee arthroplasty revision and tolerated the procedure well.  She is admitted to this facility for short-term rehabilitation.  She denies knee pain.    CONSTIPATION: The constipation remains stable. No complications from the medications presently being used. Patient denies ongoing constipation, abdominal pain, nausea or vomiting.   DEPRESSION: The depression remains stable. Patient denies ongoing feelings of sadness, insomnia, anedhonia or lack of appetite. No complications reported from the medications currently being used. Staff do not report behavioral problems.   PAST MEDICAL HISTORY :  Past Medical History  Diagnosis Date  . Anxiety   . Detrusor instability   . Uterine prolapse   . Atrophic vaginitis   . Sciatic pain   . Osteoporosis   . GERD (gastroesophageal reflux disease)   . Arthritis   . Psoriasis     PAST SURGICAL HISTORY: Past Surgical History  Procedure Laterality Date  . Appendectomy    . Foot surgery    . Knee surgery    . Total knee arthroplasty Bilateral     X2-- 2009; 2010  . Total knee revision Left 01/23/2013    Procedure:  TOTAL KNEE ARTHROPLASTY REVISION ;  Surgeon: Loanne Drilling, MD;  Location: [facility];  Service: Orthopedics;  Laterality: Left;    SOCIAL  HISTORY:  reports that she has been smoking Cigarettes.  She has been smoking about 0.20 packs per day. She has never used smokeless tobacco. She reports that she does not drink alcohol or use illicit drugs.  FAMILY HISTORY:  Family History  Problem Relation Age of Onset  . Cancer Mother     GALLBLADDER   . Breast cancer Cousin     Mat. 1st cousin Age 52    CURRENT MEDICATIONS: Reviewed per [facility]  REVIEW OF SYSTEMS:  See HPI otherwise 14 point ROS is negative.  PHYSICAL EXAMINATION  VS:  not recorded    GENERAL: no acute distress, normal body habitus EYES: conjunctivae normal, sclerae normal, normal eye lids MOUTH/THROAT: lips without lesions,no lesions in the mouth,tongue is without lesions,uvula elevates in midline NECK: supple, trachea midline, no neck masses, no thyroid tenderness, no thyromegaly LYMPHATICS: no LAN in the neck, no supraclavicular LAN RESPIRATORY: breathing is even & unlabored, BS CTAB CARDIAC: RRR, no murmur,no extra heart sounds EDEMA/VARICOSITIES:  +3 bilateral lower extremity edema  ARTERIAL:  pedal pulses nonpalpable   GI:  ABDOMEN: abdomen soft, normal BS, no masses, no tenderness  LIVER/SPLEEN: no hepatomegaly, no splenomegaly MUSCULOSKELETAL: HEAD: normal to inspection & palpation BACK: no kyphosis, scoliosis or spinal processes tenderness EXTREMITIES: LEFT UPPER EXTREMITY: full range of motion, normal strength & tone RIGHT UPPER EXTREMITY:  full range of motion, normal strength & tone LEFT LOWER EXTREMITY: strength intact, range of motion not  tested due to knee surgery    RIGHT LOWER EXTREMITY:  full range of motion, normal strength & tone PSYCHIATRIC: the patient is alert & oriented to person, affect & behavior appropriate  LABS/RADIOLOGY: Hemoglobin 11.8, MCV 88.8, otherwise CBC normal.    Glucose 110, otherwise BMP normal.    PTT 26, INR 1.02, PT 13.2.    Liver profile normal.    Urinalysis negative.    MRSA by PCR negative.      Staph aureus by PCR negative.     Chest x-ray:  No acute disease.     ASSESSMENT/PLAN:  Left knee pain secondary to loose femoral component of left total knee arthroplasty.  Status post revision.  Continue rehabilitation.    Constipation.  Well controlled.    Depression.  Denies ongoing symptoms.     Anxiety.  Stable.    Check CBC and BMP.    I have reviewed patient's medical records received at admission/from hospitalization.  CPT CODE: 02725

## 2013-03-10 ENCOUNTER — Ambulatory Visit: Payer: Medicare PPO

## 2013-03-19 ENCOUNTER — Ambulatory Visit: Payer: Medicare PPO | Attending: Orthopedic Surgery

## 2013-03-19 DIAGNOSIS — R609 Edema, unspecified: Secondary | ICD-10-CM | POA: Insufficient documentation

## 2013-03-19 DIAGNOSIS — R262 Difficulty in walking, not elsewhere classified: Secondary | ICD-10-CM | POA: Insufficient documentation

## 2013-03-19 DIAGNOSIS — M25669 Stiffness of unspecified knee, not elsewhere classified: Secondary | ICD-10-CM | POA: Insufficient documentation

## 2013-03-19 DIAGNOSIS — Z96659 Presence of unspecified artificial knee joint: Secondary | ICD-10-CM | POA: Insufficient documentation

## 2013-03-19 DIAGNOSIS — M25569 Pain in unspecified knee: Secondary | ICD-10-CM | POA: Insufficient documentation

## 2013-03-19 DIAGNOSIS — IMO0001 Reserved for inherently not codable concepts without codable children: Secondary | ICD-10-CM | POA: Insufficient documentation

## 2013-08-19 ENCOUNTER — Encounter: Payer: Self-pay | Admitting: Gynecology

## 2013-08-25 ENCOUNTER — Ambulatory Visit: Payer: Medicare PPO | Admitting: Physical Therapy

## 2013-11-01 ENCOUNTER — Other Ambulatory Visit: Payer: Self-pay | Admitting: Gynecology

## 2013-12-31 ENCOUNTER — Other Ambulatory Visit: Payer: Self-pay | Admitting: Gynecology

## 2013-12-31 ENCOUNTER — Encounter: Payer: Medicare PPO | Admitting: Gynecology

## 2013-12-31 NOTE — Telephone Encounter (Signed)
Pt has annual scheduled on 01/28/14.

## 2014-01-28 ENCOUNTER — Encounter: Payer: Self-pay | Admitting: Gynecology

## 2014-01-28 ENCOUNTER — Ambulatory Visit (INDEPENDENT_AMBULATORY_CARE_PROVIDER_SITE_OTHER): Payer: Medicare PPO | Admitting: Gynecology

## 2014-01-28 VITALS — BP 126/78 | Ht 67.75 in | Wt 199.0 lb

## 2014-01-28 DIAGNOSIS — M949 Disorder of cartilage, unspecified: Secondary | ICD-10-CM

## 2014-01-28 DIAGNOSIS — N812 Incomplete uterovaginal prolapse: Secondary | ICD-10-CM | POA: Insufficient documentation

## 2014-01-28 DIAGNOSIS — M899 Disorder of bone, unspecified: Secondary | ICD-10-CM

## 2014-01-28 DIAGNOSIS — Z8619 Personal history of other infectious and parasitic diseases: Secondary | ICD-10-CM | POA: Insufficient documentation

## 2014-01-28 DIAGNOSIS — L293 Anogenital pruritus, unspecified: Secondary | ICD-10-CM

## 2014-01-28 DIAGNOSIS — M8580 Other specified disorders of bone density and structure, unspecified site: Secondary | ICD-10-CM

## 2014-01-28 DIAGNOSIS — L292 Pruritus vulvae: Secondary | ICD-10-CM

## 2014-01-28 DIAGNOSIS — N952 Postmenopausal atrophic vaginitis: Secondary | ICD-10-CM

## 2014-01-28 DIAGNOSIS — R6882 Decreased libido: Secondary | ICD-10-CM

## 2014-01-28 DIAGNOSIS — N814 Uterovaginal prolapse, unspecified: Secondary | ICD-10-CM

## 2014-01-28 DIAGNOSIS — N898 Other specified noninflammatory disorders of vagina: Secondary | ICD-10-CM

## 2014-01-28 LAB — WET PREP FOR TRICH, YEAST, CLUE
CLUE CELLS WET PREP: NONE SEEN
Trich, Wet Prep: NONE SEEN
Yeast Wet Prep HPF POC: NONE SEEN

## 2014-01-28 MED ORDER — VALACYCLOVIR HCL 500 MG PO TABS: 500.0000 mg | ORAL_TABLET | Freq: Every day | ORAL | Status: DC

## 2014-01-28 MED ORDER — NONFORMULARY OR COMPOUNDED ITEM: Status: DC

## 2014-01-28 NOTE — Patient Instructions (Signed)
Atrophic Vaginitis Atrophic vaginitis is a problem of low levels of estrogen in women. This problem can happen at any age. It is most common in women who have gone through menopause ("the change").  HOW WILL I KNOW IF I HAVE THIS PROBLEM? You may have:  Trouble with peeing (urinating), such as:  Going to the bathroom often.  A hard time holding your pee until you reach a bathroom.  Leaking pee.  Having pain when you pee.  Itching or a burning feeling.  Vaginal bleeding and spotting.  Pain during sex.  Dryness of the vagina.  A yellow, bad-smelling fluid (discharge) coming from the vagina. HOW WILL MY DOCTOR CHECK FOR THIS PROBLEM?  During your exam, your doctor will likely find the problem.  If there is a vaginal fluid, it may be checked for infection. HOW WILL THIS PROBLEM BE TREATED? Keep the vulvar skin as clean as possible. Moisturizers and lubricants can help with some of the symptoms. Estrogen replacement can help. There are 2 ways to take estrogen:  Systemic estrogen gets estrogen to your whole body. It takes many weeks or months before the symptoms get better.  You take an estrogen pill.  You use a skin patch. This is a patch that you put on your skin.  If you still have your uterus, your doctor may ask you to take a hormone. Talk to your doctor about the right medicine for you.  Estrogen cream.  This puts estrogen only at the part of your body where you apply it. The cream is put into the vagina or put on the vulvar skin. For some women, estrogen cream works faster than pills or the patch. CAN ALL WOMEN WITH THIS PROBLEM USE ESTROGEN? No. Women with certain types of cancer, liver problems, or problems with blood clots should not take estrogen. Your doctor can help you decide the best treatment for your symptoms. Document Released: 11/21/2007 Document Revised: 06/09/2013 Document Reviewed: 11/21/2007 ExitCare Patient Information 2015 [employer]. This  information is not intended to replace advice given to you by your health care provider. Make sure you discuss any questions you have with your health care provider. Hormone Therapy At menopause, your body begins making less estrogen and progesterone hormones. This causes the body to stop having menstrual periods. This is because estrogen and progesterone hormones control your periods and menstrual cycle. A lack of estrogen may cause symptoms such as:  Hot flushes (or hot flashes).  Vaginal dryness.  Dry skin.  Loss of sex drive.  Risk of bone loss (osteoporosis). When this happens, you may choose to take hormone therapy to get back the estrogen lost during menopause. When the hormone estrogen is given alone, it is usually referred to as ET (Estrogen Therapy). When the hormone progestin is combined with estrogen, it is generally called HT (Hormone Therapy). This was formerly known as hormone replacement therapy (HRT). Your caregiver can help you make a decision on what will be best for you. The decision to use HT seems to change often as new studies are done. Many studies do not agree on the benefits of hormone replacement therapy. LIKELY BENEFITS OF HT INCLUDE PROTECTION FROM:  Hot Flushes (also called hot flashes) - A hot flush is a sudden feeling of heat that spreads over the face and body. The skin may redden like a blush. It is connected with sweats and sleep disturbance. Women going through menopause may have hot flushes a few times a month or several times   per day depending on the woman.  Osteoporosis (bone loss)- Estrogen helps guard against bone loss. After menopause, a woman's bones slowly lose calcium and become weak and brittle. As a result, bones are more likely to break. The hip, wrist, and spine are affected most often. Hormone therapy can help slow bone loss after menopause. Weight bearing exercise and taking calcium with vitamin D also can help prevent bone loss. There are also  medications that your caregiver can prescribe that can help prevent osteoporosis.  Vaginal Dryness - Loss of estrogen causes changes in the vagina. Its lining may become thin and dry. These changes can cause pain and bleeding during sexual intercourse. Dryness can also lead to infections. This can cause burning and itching. (Vaginal estrogen treatment can help relieve pain, itching, and dryness.)  Urinary Tract Infections are more common after menopause because of lack of estrogen. Some women also develop urinary incontinence because of low estrogen levels in the vagina and bladder.  Possible other benefits of estrogen include a positive effect on mood and short-term memory in women. RISKS AND COMPLICATIONS  Using estrogen alone without progesterone causes the lining of the uterus to grow. This increases the risk of lining of the uterus (endometrial) cancer. Your caregiver should give another hormone called progestin if you have a uterus.  Women who take combined (estrogen and progestin) HT appear to have an increased risk of breast cancer. The risk appears to be small, but increases throughout the time that HT is taken.  Combined therapy also makes the breast tissue slightly denser which makes it harder to read mammograms (breast X-rays).  Combined, estrogen and progesterone therapy can be taken together every day, in which case there may be spotting of blood. HT therapy can be taken cyclically in which case you will have menstrual periods. Cyclically means HT is taken for a set amount of days, then not taken, then this process is repeated.  HT may increase the risk of stroke, heart attack, breast cancer and forming blood clots in your leg.  Transdermal estrogen (estrogen that is absorbed through the skin with a patch or a cream) may have more positive results with:  Cholesterol.  Blood pressure.  Blood clots. Having the following conditions may indicate you should not have  HT:  Endometrial cancer.  Liver disease.  Breast cancer.  Heart disease.  History of blood clots.  Stroke. TREATMENT   If you choose to take HT and have a uterus, usually estrogen and progestin are prescribed.  Your caregiver will help you decide the best way to take the medications.  Possible ways to take estrogen include:  Pills.  Patches.  Gels.  Sprays.  Vaginal estrogen cream, rings and tablets.  It is best to take the lowest dose possible that will help your symptoms and take them for the shortest period of time that you can.  Hormone therapy can help relieve some of the problems (symptoms) that affect women at menopause. Before making a decision about HT, talk to your caregiver about what is best for you. Be well informed and comfortable with your decisions. HOME CARE INSTRUCTIONS   Follow your caregivers advice when taking the medications.  A Pap test is done to screen for cervical cancer.  The first Pap test should be done at age 21.  Between ages 21 and 29, Pap tests are repeated every 2 years.  Beginning at age 30, you are advised to have a Pap test every 3 years as long as   your past 3 Pap tests have been normal.  Some women have medical problems that increase the chance of getting cervical cancer. Talk to your caregiver about these problems. It is especially important to talk to your caregiver if a new problem develops soon after your last Pap test. In these cases, your caregiver may recommend more frequent screening and Pap tests.  The above recommendations are the same for women who have or have not gotten the vaccine for HPV (Human Papillomavirus).  If you had a hysterectomy for a problem that was not a cancer or a condition that could lead to cancer, then you no longer need Pap tests. However, even if you no longer need a Pap test, a regular exam is a good idea to make sure no other problems are starting.   If you are between ages 65 and 70, and  you have had normal Pap tests going back 10 years, you no longer need Pap tests. However, even if you no longer need a Pap test, a regular exam is a good idea to make sure no other problems are starting.   If you have had past treatment for cervical cancer or a condition that could lead to cancer, you need Pap tests and screening for cancer for at least 20 years after your treatment.  If Pap tests have been discontinued, risk factors (such as a new sexual partner) need to be re-assessed to determine if screening should be resumed.  Some women may need screenings more often if they are at high risk for cervical cancer.  Get mammograms done as per the advice of your caregiver. SEEK IMMEDIATE MEDICAL CARE IF:  You develop abnormal vaginal bleeding.  You have pain or swelling in your legs, shortness of breath, or chest pain.  You develop dizziness or headaches.  You have lumps or changes in your breasts or armpits.  You have slurred speech.  You develop weakness or numbness of your arms or legs.  You have pain, burning, or bleeding when urinating.  You develop abdominal pain. Document Released: 03/03/2003 Document Revised: 08/27/2011 Document Reviewed: 06/21/2010 ExitCare Patient Information 2015 [employer]. This information is not intended to replace advice given to you by your health care provider. Make sure you discuss any questions you have with your health care provider.  

## 2014-01-28 NOTE — Progress Notes (Addendum)
Brittany SetonHurlyn D Koch 01/23/1941 [id number]   History:    73 y.o.  for GYN exam and followup. Patient complaining of irritation and the carina this but no true discharge meds will. The patient does have history of vaginal atrophy. She's using over-the-counter lubricants which she does have intercourse.She does have history of urinary frequency without urgency or any incontinence. Her last bone density was in 2010 the lowest T score was at the [facility] with a value -1.3 in the osteopenic (decrease  bone mineralization) range.Patient with prior history of osteoporosis had been treated with Fosamax from 2006-2011 and has been on a drug holiday.She is taking 1200 mg of calcium daily and at 1000 units of vitamin D daily and states that she will irregularly exercises. Patient denies any past history of abnormal Pap smear. 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 Her last mammogram was in 2013. Her last colonoscopy was normal in 2006. Her primary physician is Dr. Kirby FunkJohn Griffin who does her lab work. Patient states her shingles and Tdap vaccine are up-to-date.     Past medical history,surgical history, family history and social history were all reviewed and documented in the EPIC chart.  Gynecologic History No LMP recorded. Patient is postmenopausal. Contraception: post menopausal status Last Pap: 2011. Results were: normal Last mammogram: 2014. Results were: normal  Obstetric History OB History  Gravida Para Term Preterm AB SAB TAB Ectopic Multiple Living  3 3 3      1 4     # Outcome Date GA Lbr Len/2nd Weight Sex Delivery Anes PTL Lv  3 TRM           2 TRM           1 TRM                ROS: A ROS was performed and pertinent positives and negatives are included in the history.  GENERAL: No fevers or chills. HEENT: No change in vision, no earache, sore throat or sinus congestion. NECK: No pain or stiffness. CARDIOVASCULAR: No chest pain or pressure. No palpitations. PULMONARY:  No shortness of breath, cough or wheeze. GASTROINTESTINAL: No abdominal pain, nausea, vomiting or diarrhea, melena or bright red blood per rectum. GENITOURINARY: No urinary frequency, urgency, hesitancy or dysuria. MUSCULOSKELETAL: No joint or muscle pain, no back pain, no recent trauma. DERMATOLOGIC: No rash, no itching, no lesions. ENDOCRINE: No polyuria, polydipsia, no heat or cold intolerance. No recent change in weight. HEMATOLOGICAL: No anemia or easy bruising or bleeding. NEUROLOGIC: No headache, seizures, numbness, tingling or weakness. PSYCHIATRIC: No depression, no loss of interest in normal activity or change in sleep pattern.     Exam: chaperone present  BP 126/78  Ht 5' 7.75" (1.721 m)  Wt 199 lb (90.266 kg)  BMI 30.48 kg/m2  Body mass index is 30.48 kg/(m^2).  General appearance : Well developed well nourished female. No acute distress HEENT: Neck supple, trachea midline, no carotid bruits, no thyroidmegaly Lungs: Clear to auscultation, no rhonchi or wheezes, or rib retractions  Heart: Regular rate and rhythm, no murmurs or gallops Breast:Examined in sitting and supine position were symmetrical in appearance, no palpable masses or tenderness,  no skin retraction, no nipple inversion, no nipple discharge, no skin discoloration, no axillary or supraclavicular lymphadenopathy Abdomen: no palpable masses or tenderness, no rebound or guarding Extremities: no edema or skin discoloration or tenderness  Pelvic:  Bartholin, Urethra, Skene Glands: Within normal limits  Vagina: No gross lesions or discharge, vaginal atrophy  Cervix: No gross lesions or discharge  Uterus  axial, normal size, shape and consistency, first-degree-second-degree uterine prolapse  Adnexa  Without masses or tenderness  Anus and perineum  normal   Rectovaginal  normal sphincter tone without palpated masses or tenderness             Hemoccult PCP provides   Wet prep: Negative  Assessment/Plan:   73 y.o. female with atrophic vaginitis and decreased libido. Patient with first-degree-second-degree uterine prolapse. We discussed prescribing vaginal estrogen low dose to apply intravaginal twice a week. She will be prescribed vaginal estradiol 0.02% to apply twice a week. Risks benefits and pros and cons were discussed. Patient's PCP will be doing her blood work. She scheduled for mammogram and bone density study this coming December. She is currently on a drug holiday and will await to see the bone density study in December. She will continue with her calcium and vitamin D in regular exercise.   Note: This dictation was prepared with  Dragon/digital dictation along withSmart phrase technology. Any transcriptional errors that result from this process are unintentional.   Ok Edwards MD, 2:40 PM 01/28/2014

## 2014-02-08 ENCOUNTER — Ambulatory Visit (INDEPENDENT_AMBULATORY_CARE_PROVIDER_SITE_OTHER): Payer: Medicare PPO | Admitting: Gynecology

## 2014-02-08 ENCOUNTER — Encounter: Payer: Self-pay | Admitting: Gynecology

## 2014-02-08 VITALS — BP 132/78

## 2014-02-08 DIAGNOSIS — N8111 Cystocele, midline: Secondary | ICD-10-CM

## 2014-02-08 DIAGNOSIS — N393 Stress incontinence (female) (male): Secondary | ICD-10-CM

## 2014-02-08 DIAGNOSIS — N9489 Other specified conditions associated with female genital organs and menstrual cycle: Secondary | ICD-10-CM

## 2014-02-08 DIAGNOSIS — R102 Pelvic and perineal pain: Secondary | ICD-10-CM

## 2014-02-08 DIAGNOSIS — N814 Uterovaginal prolapse, unspecified: Secondary | ICD-10-CM

## 2014-02-08 DIAGNOSIS — N86 Erosion and ectropion of cervix uteri: Secondary | ICD-10-CM

## 2014-02-08 DIAGNOSIS — IMO0002 Reserved for concepts with insufficient information to code with codable children: Secondary | ICD-10-CM

## 2014-02-08 NOTE — Progress Notes (Signed)
   73 year old patient who presented to the office today complaining of vaginal irritation. Patient with known first-degree-second-degree uterine prolapse. Patient with known vaginal atrophy. Patient with complaints of urinary incontinence at times.  Exam: Bartholin urethra Skene with atrophic changes Vagina: On Valsalva maneuver patient's cervix approached the introitus and a small area appears to be irritated from Ambien when necessary dryness as a result of prolapse. Patient was examined in the right position there was no evidence of rectocele or enterocele. She did have a first-second-degree cystocele along with second-degree uterine prolapse. On Valsalva she did not leak in the office.  Assessment/plan: Patient recently started vaginal estrogen twice a week less than 10 days ago. Her cervical erosion at the anterior cervical lip is attributed to the cervix near the introitus exposed and an air causing erosion and irritation. We discussed possibly proceeding with surgery but she was adamant so we discussed alternative such as pessary. She was fitted for a 2 inch Gellhorn pessary was able to insert it herself and to remove it without any difficulty. She will use this pessary and taken out weekly for cleansing but twice a week she will apply the estradiol 0.02% twice a week for vaginal atrophy. I've asked her to return to the office in one month for followup. If no improvement she will need to reconsider surgical intervention with possible urological consultation as well. Literature information was provided.

## 2014-03-09 ENCOUNTER — Ambulatory Visit (INDEPENDENT_AMBULATORY_CARE_PROVIDER_SITE_OTHER): Payer: Medicare PPO | Admitting: Gynecology

## 2014-03-09 ENCOUNTER — Encounter: Payer: Self-pay | Admitting: Gynecology

## 2014-03-09 VITALS — BP 124/70

## 2014-03-09 DIAGNOSIS — N814 Uterovaginal prolapse, unspecified: Secondary | ICD-10-CM

## 2014-03-09 DIAGNOSIS — N899 Noninflammatory disorder of vagina, unspecified: Secondary | ICD-10-CM

## 2014-03-09 DIAGNOSIS — Z23 Encounter for immunization: Secondary | ICD-10-CM

## 2014-03-09 DIAGNOSIS — Z4689 Encounter for fitting and adjustment of other specified devices: Secondary | ICD-10-CM

## 2014-03-09 DIAGNOSIS — N898 Other specified noninflammatory disorders of vagina: Secondary | ICD-10-CM

## 2014-03-09 DIAGNOSIS — N8111 Cystocele, midline: Secondary | ICD-10-CM

## 2014-03-09 DIAGNOSIS — IMO0002 Reserved for concepts with insufficient information to code with codable children: Secondary | ICD-10-CM

## 2014-03-09 NOTE — Patient Instructions (Signed)

## 2014-03-09 NOTE — Addendum Note (Signed)
Addended by: Berna Spare A on: 03/09/2014 12:43 PM   Modules accepted: Orders

## 2014-03-09 NOTE — Progress Notes (Signed)
   Patient is a 73 year old who was seen in the office on August 24 complaining of vaginal irritation.Patient with known first-degree-second-degree uterine prolapse. Patient with known vaginal atrophy. Patient with complaints of urinary incontinence at times. During her exam the following was noted:  Bartholin urethra Skene with atrophic changes  Vagina: On Valsalva maneuver patient's cervix approached the introitus and a small area appears to be irritated on the anterior cervical lip  as a result of prolapse.  Patient was examined in the right position there was no evidence of rectocele or enterocele. She did have a first-second-degree cystocele along with second-degree uterine prolapse. On Valsalva she did not leak in the office.  We had discussed possibly proceeding with surgery but she was adamant so we discussed alternative such as pessary. She was fitted for a 2 inch Gellhorn pessary was able to insert it herself and to remove it without any difficulty. She has used it for the past month without any difficulties having no stress incontinence. She takes weekly to clean it. She also applied as the estradiol 0.02% twice a week for vaginal atrophy.  The pressure was removed and cleansed and replaced. The anterior cervical erosion is very minimal in comparison to last month. She will continue with the above regimen. All last surgery turned back to the office in 3 months for followup. She received the flu vaccine today.

## 2014-04-19 ENCOUNTER — Encounter: Payer: Self-pay | Admitting: Gynecology

## 2014-05-31 ENCOUNTER — Ambulatory Visit (INDEPENDENT_AMBULATORY_CARE_PROVIDER_SITE_OTHER): Payer: Medicare PPO | Admitting: Gynecology

## 2014-05-31 ENCOUNTER — Encounter: Payer: Self-pay | Admitting: Gynecology

## 2014-05-31 VITALS — BP 136/88

## 2014-05-31 DIAGNOSIS — IMO0002 Reserved for concepts with insufficient information to code with codable children: Secondary | ICD-10-CM

## 2014-05-31 DIAGNOSIS — N814 Uterovaginal prolapse, unspecified: Secondary | ICD-10-CM

## 2014-05-31 DIAGNOSIS — Z4689 Encounter for fitting and adjustment of other specified devices: Secondary | ICD-10-CM

## 2014-05-31 DIAGNOSIS — N8110 Cystocele, unspecified: Secondary | ICD-10-CM

## 2014-05-31 NOTE — Progress Notes (Signed)
   Patient is a 73 year old with a first-degree-second-degree uterine prolapse. Patient also suffers from vaginal atrophy. Patient had complained at times her urinary incontinence.She was fitted for a 2 inch Gellhorn pessary was able to insert it herself and to remove it without any difficulty. She has used it for the past month without any difficulties having no stress incontinence. She takes weekly to clean it. She also applies as the estradiol 0.02% twice a week for vaginal atrophy. She presented today for pessary cleaning and for examination.  Exam: Bartholin urethra Skene with atrophic changes  Vagina: On Valsalva maneuver patient's cervix approached the introitus  There was no evidence of rectocele or enterocele but she does have a first-degree-second-degree cystocele along with second-degree uterine prolapse.  Assessment/plan: Patient with first-degree-second-degree cystocele along with second-degree uterine prolapse doing well with Gellhorn pessary. The patient was cleansed reinserted patient will continue to change weekly at home and apply the vaginal estrogen twice a week. We will see her next year for annual exam or when necessary.

## 2014-08-20 ENCOUNTER — Encounter: Payer: Self-pay | Admitting: Gynecology

## 2014-10-25 ENCOUNTER — Ambulatory Visit (INDEPENDENT_AMBULATORY_CARE_PROVIDER_SITE_OTHER): Payer: Medicare PPO | Admitting: Gynecology

## 2014-10-25 ENCOUNTER — Encounter: Payer: Self-pay | Admitting: Gynecology

## 2014-10-25 VITALS — BP 128/70

## 2014-10-25 DIAGNOSIS — N952 Postmenopausal atrophic vaginitis: Secondary | ICD-10-CM | POA: Diagnosis not present

## 2014-10-25 DIAGNOSIS — N644 Mastodynia: Secondary | ICD-10-CM | POA: Diagnosis not present

## 2014-10-25 DIAGNOSIS — Z4689 Encounter for fitting and adjustment of other specified devices: Secondary | ICD-10-CM | POA: Diagnosis not present
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
## 2014-11-04 ENCOUNTER — Ambulatory Visit: Payer: Medicare PPO | Admitting: Gynecology

## 2014-12-02 DIAGNOSIS — L986 Other infiltrative disorders of the skin and subcutaneous tissue: Secondary | ICD-10-CM | POA: Diagnosis not present

## 2015-01-31 ENCOUNTER — Encounter: Payer: Medicare PPO | Admitting: Gynecology

## 2015-02-11 ENCOUNTER — Other Ambulatory Visit: Payer: Self-pay | Admitting: Gynecology

## 2015-02-18 DIAGNOSIS — Z1389 Encounter for screening for other disorder: Secondary | ICD-10-CM | POA: Diagnosis not present

## 2015-02-18 DIAGNOSIS — Z6832 Body mass index (BMI) 32.0-32.9, adult: Secondary | ICD-10-CM | POA: Diagnosis not present

## 2015-02-18 DIAGNOSIS — Z0000 Encounter for general adult medical examination without abnormal findings: Secondary | ICD-10-CM | POA: Diagnosis not present

## 2015-02-18 DIAGNOSIS — E669 Obesity, unspecified: Secondary | ICD-10-CM | POA: Diagnosis not present

## 2015-02-18 DIAGNOSIS — Z23 Encounter for immunization: Secondary | ICD-10-CM | POA: Diagnosis not present

## 2015-02-18 DIAGNOSIS — H9191 Unspecified hearing loss, right ear: Secondary | ICD-10-CM | POA: Diagnosis not present

## 2015-02-18 DIAGNOSIS — M8580 Other specified disorders of bone density and structure, unspecified site: Secondary | ICD-10-CM | POA: Diagnosis not present

## 2015-03-01 DIAGNOSIS — Z79899 Other long term (current) drug therapy: Secondary | ICD-10-CM | POA: Diagnosis not present

## 2015-03-01 DIAGNOSIS — L986 Other infiltrative disorders of the skin and subcutaneous tissue: Secondary | ICD-10-CM | POA: Diagnosis not present

## 2015-03-07 DIAGNOSIS — Z79899 Other long term (current) drug therapy: Secondary | ICD-10-CM | POA: Diagnosis not present

## 2015-03-07 DIAGNOSIS — C84A0 Cutaneous T-cell lymphoma, unspecified, unspecified site: Secondary | ICD-10-CM | POA: Diagnosis not present

## 2015-03-08 DIAGNOSIS — F411 Generalized anxiety disorder: Secondary | ICD-10-CM | POA: Diagnosis not present

## 2015-03-24 DIAGNOSIS — M8589 Other specified disorders of bone density and structure, multiple sites: Secondary | ICD-10-CM | POA: Diagnosis not present

## 2015-03-24 DIAGNOSIS — M859 Disorder of bone density and structure, unspecified: Secondary | ICD-10-CM | POA: Diagnosis not present

## 2015-05-03 DIAGNOSIS — Z1211 Encounter for screening for malignant neoplasm of colon: Secondary | ICD-10-CM | POA: Diagnosis not present

## 2015-08-08 ENCOUNTER — Other Ambulatory Visit: Payer: Self-pay | Admitting: Gynecology

## 2015-11-22 ENCOUNTER — Ambulatory Visit (INDEPENDENT_AMBULATORY_CARE_PROVIDER_SITE_OTHER): Payer: Medicare Other | Admitting: Gynecology

## 2015-11-22 ENCOUNTER — Encounter: Payer: Self-pay | Admitting: Gynecology

## 2015-11-22 VITALS — BP 134/72

## 2015-11-22 DIAGNOSIS — N952 Postmenopausal atrophic vaginitis: Secondary | ICD-10-CM | POA: Diagnosis not present

## 2015-11-22 DIAGNOSIS — Z79890 Hormone replacement therapy (postmenopausal): Secondary | ICD-10-CM | POA: Diagnosis not present

## 2015-11-22 DIAGNOSIS — N898 Other specified noninflammatory disorders of vagina: Secondary | ICD-10-CM | POA: Diagnosis not present

## 2015-11-22 DIAGNOSIS — Z124 Encounter for screening for malignant neoplasm of cervix: Secondary | ICD-10-CM | POA: Diagnosis not present

## 2015-11-22 DIAGNOSIS — N814 Uterovaginal prolapse, unspecified: Secondary | ICD-10-CM

## 2015-11-22 DIAGNOSIS — N950 Postmenopausal bleeding: Secondary | ICD-10-CM

## 2015-11-22 LAB — WET PREP FOR TRICH, YEAST, CLUE
Clue Cells Wet Prep HPF POC: NONE SEEN
TRICH WET PREP: NONE SEEN
YEAST WET PREP: NONE SEEN

## 2015-11-22 MED ORDER — METRONIDAZOLE 500 MG PO TABS: 500.0000 mg | ORAL_TABLET | Freq: Two times a day (BID) | ORAL | Status: DC

## 2015-11-22 MED ORDER — ESTRADIOL 0.1 MG/GM VA CREA: TOPICAL_CREAM | VAGINAL | Status: DC

## 2015-11-22 NOTE — Progress Notes (Signed)
   HPI: Patient is a 75 year old who presented to the office today with several complaints. Patient was complaining of vaginal discharge with odor. Patient not sexually active. Patient with history of uterine prolapse who has a Gellhorn pessary for support which has helped her. She is only using KY gel and not estrogen cream for her vagina. She denies any dysuria or any frequency. No problems with bowel movement. No fever, chills, nausea, or vomiting. She also had noticed the past few times that when she takes her pessary once we she's noted some blood but not to the extent of a menstrual cycle. Patient reports no past history of any abnormal Pap smear.   ROS: A ROS was performed and pertinent positives and negatives are included in the history.  GENERAL: No fevers or chills. HEENT: No change in vision, no earache, sore throat or sinus congestion. NECK: No pain or stiffness. CARDIOVASCULAR: No chest pain or pressure. No palpitations. PULMONARY: No shortness of breath, cough or wheeze. GASTROINTESTINAL: No abdominal pain, nausea, vomiting or diarrhea, melena or bright red blood per rectum. GENITOURINARY: No urinary frequency, urgency, hesitancy or dysuria. MUSCULOSKELETAL: No joint or muscle pain, no back pain, no recent trauma. DERMATOLOGIC: No rash, no itching, no lesions. ENDOCRINE: No polyuria, polydipsia, no heat or cold intolerance. No recent change in weight. HEMATOLOGICAL: No anemia or easy bruising or bleeding. NEUROLOGIC: No headache, seizures, numbness, tingling or weakness. PSYCHIATRIC: No depression, no loss of interest in normal activity or change in sleep pattern.   PE: Blood pressure 134/72 Well-developed well-nourished female with the above-mentioned complaining Pelvic: Bartholin urethra Skene glands with atrophic changes Vagina: Mucosa with no lesions or discharge Cervix: Nabothian cysts seen at the 6:00 position and friable ectocervix near the 1:00 position where with vascular. Uterus:  Anteverted normal size shape and consistency Adnexa: No palpable masses or tenderness Rectal exam: Not done  Pap smear obtained before the bimanual exam.  Patient was counseled for an endometrial biopsy in the event of the blood that she had noticed was coming from the uterus. Her cervix was cleansed with Betadine solution and several attempts with a Pipelle did not result in any tissue to submitted for histology.  The colposcope was brought into view and once again the external genitalia and vagina and cervix were inspected confirmation of its nabothian cysts at the 6:00 position was noted as well as the vascular area near the ectocervix at the 1:00 position which could've been due to erosion from the pessary and could be a sign which she experience some spotting which she had removed her pessary in the past.  Wet prep moderate white blood cell few bacteria  Assessment Plan: #1 nabothian cyst of cervix #2 mild erosion ectocervix 1:00 from pessary #3 mild BV will be treated with Flagyl 500 mg one by mouth twice a day for 7 days #4 vaginal atrophy will prescribe Estrace vaginal cream to apply 1 g intravaginally weekly when she removes her pessary for cleansing. #5 Pap smear done today result pending at time of this dictation    Greater than 50% of time was spent in counseling and coordinating care of this patient.   Time of consultation: 25   Minutes.

## 2015-11-22 NOTE — Patient Instructions (Addendum)
Estradiol vaginal cream What is this medicine? ESTRADIOL (es tra DYE ole) contains the female hormone estrogen. It is used for symptoms of menopause, like vaginal dryness and irritation. This medicine may be used for other purposes; ask your health care provider or pharmacist if you have questions. What should I tell my health care provider before I take this medicine? They need to know if you have any of these conditions: -abnormal vaginal bleeding -blood vessel disease or blood clots -breast, cervical, endometrial, ovarian, liver, or uterine cancer -dementia -diabetes -gallbladder disease -heart disease or recent heart attack -high blood pressure -high cholesterol -high levels of calcium in the blood -hysterectomy -kidney disease -liver disease -migraine headaches -protein C deficiency -protein S deficiency -stroke -systemic lupus erythematosus (SLE) -tobacco smoker -an unusual or allergic reaction to estrogens, other hormones, soy, other medicines, foods, dyes, or preservatives -pregnant or trying to get pregnant -breast-feeding How should I use this medicine? This medicine is for use in the vagina only. Do not take by mouth. Follow the directions on the prescription label. Read package directions carefully before using. Use the special applicator supplied with the cream. Wash hands before and after use. Fill the applicator with the prescribed amount of cream. Lie on your back, part and bend your knees. Insert the applicator into the vagina and push the plunger to expel the cream into the vagina. Wash the applicator with warm soapy water and rinse well. Use exactly as directed for the complete length of time prescribed. Do not stop using except on the advice of your doctor or health care professional. A patient package insert for the product will be given with each prescription and refill. Read this sheet carefully each time. The sheet may change frequently. Talk to your  pediatrician regarding the use of this medicine in children. This medicine is not approved for use in children. Overdosage: If you think you have taken too much of this medicine contact a poison control center or emergency room at once. NOTE: This medicine is only for you. Do not share this medicine with others. What if I miss a dose? If you miss a dose, use it as soon as you can. If it is almost time for your next dose, use only that dose. Do not use double or extra doses. What may interact with this medicine? Do not take this medicine with any of the following medications: -aromatase inhibitors like aminoglutethimide, anastrozole, exemestane, letrozole, testolactone This medicine may also interact with the following medications: -barbiturates used for inducing sleep or treating seizures -carbamazepine -grapefruit juice -medicines for fungal infections like ketoconazole and itraconazole -raloxifene -rifabutin -rifampin -rifapentine -ritonavir -some antibiotics used to treat infections -St. John's Wort -tamoxifen -warfarin This list may not describe all possible interactions. Give your health care provider a list of all the medicines, herbs, non-prescription drugs, or dietary supplements you use. Also tell them if you smoke, drink alcohol, or use illegal drugs. Some items may interact with your medicine. What should I watch for while using this medicine? Visit your health care professional for regular checks on your progress. You will need a regular breast and pelvic exam. You should also discuss the need for regular mammograms with your health care professional, and follow his or her guidelines. This medicine can make your body retain fluid, making your fingers, hands, or ankles swell. Your blood pressure can go up. Contact your doctor or health care professional if you feel you are retaining fluid. If you have any reason to think   you are pregnant, stop taking this medicine at once and  contact your doctor or health care professional. Tobacco smoking increases the risk of getting a blood clot or having a stroke, especially if you are more than 76 years old. You are strongly advised not to smoke. If you wear contact lenses and notice visual changes, or if the lenses begin to feel uncomfortable, consult your eye care specialist. If you are going to have elective surgery, you may need to stop taking this medicine beforehand. Consult your health care professional for advice prior to scheduling the surgery. What side effects may I notice from receiving this medicine? Side effects that you should report to your doctor or health care professional as soon as possible: -allergic reactions like skin rash, itching or hives, swelling of the face, lips, or tongue -breast tissue changes or discharge -changes in vision -chest pain -confusion, trouble speaking or understanding -dark urine -general ill feeling or flu-like symptoms -light-colored stools -nausea, vomiting -pain, swelling, warmth in the leg -right upper belly pain -severe headaches -shortness of breath -sudden numbness or weakness of the face, arm or leg -trouble walking, dizziness, loss of balance or coordination -unusual vaginal bleeding -yellowing of the eyes or skin Side effects that usually do not require medical attention (report to your doctor or health care professional if they continue or are bothersome): -hair loss -increased hunger or thirst -increased urination -symptoms of vaginal infection like itching, irritation or unusual discharge -unusually weak or tired This list may not describe all possible side effects. Call your doctor for medical advice about side effects. You may report side effects to FDA at 1-800-FDA-1088. Where should I keep my medicine? Keep out of the reach of children. Store at room temperature between 15 and 30 degrees C (59 and 86 degrees F). Protect from temperatures above 40 degrees C  (104 degrees C). Do not freeze. Throw away any unused medicine after the expiration date. NOTE: This sheet is a summary. It may not cover all possible information. If you have questions about this medicine, talk to your doctor, pharmacist, or health care provider.    2016, [employer] Standard. (2010-09-06 09:18:12) BV Metronidazole tablets or capsules What is this medicine? METRONIDAZOLE (me troe NI da zole) is an antiinfective. It is used to treat certain kinds of bacterial and protozoal infections. It will not work for colds, flu, or other viral infections. This medicine may be used for other purposes; ask your health care provider or pharmacist if you have questions. What should I tell my health care provider before I take this medicine? They need to know if you have any of these conditions: -anemia or other blood disorders -disease of the nervous system -fungal or yeast infection -if you drink alcohol containing drinks -liver disease -seizures -an unusual or allergic reaction to metronidazole, or other medicines, foods, dyes, or preservatives -pregnant or trying to get pregnant -breast-feeding How should I use this medicine? Take this medicine by mouth with a full glass of water. Follow the directions on the prescription label. Take your medicine at regular intervals. Do not take your medicine more often than directed. Take all of your medicine as directed even if you think you are better. Do not skip doses or stop your medicine early. Talk to your pediatrician regarding the use of this medicine in children. Special care may be needed. Overdosage: If you think you have taken too much of this medicine contact a poison control center or emergency room at once. NOTE:  This medicine is only for you. Do not share this medicine with others. What if I miss a dose? If you miss a dose, take it as soon as you can. If it is almost time for your next dose, take only that dose. Do not take double  or extra doses. What may interact with this medicine? Do not take this medicine with any of the following medications: -alcohol or any product that contains alcohol -amprenavir oral solution -cisapride -disulfiram -dofetilide -dronedarone -paclitaxel injection -pimozide -ritonavir oral solution -sertraline oral solution -sulfamethoxazole-trimethoprim injection -thioridazine -ziprasidone This medicine may also interact with the following medications: -birth control pills -cimetidine -lithium -other medicines that prolong the QT interval (cause an abnormal heart rhythm) -phenobarbital -phenytoin -warfarin This list may not describe all possible interactions. Give your health care provider a list of all the medicines, herbs, non-prescription drugs, or dietary supplements you use. Also tell them if you smoke, drink alcohol, or use illegal drugs. Some items may interact with your medicine. What should I watch for while using this medicine? Tell your doctor or health care professional if your symptoms do not improve or if they get worse. You may get drowsy or dizzy. Do not drive, use machinery, or do anything that needs mental alertness until you know how this medicine affects you. Do not stand or sit up quickly, especially if you are an older patient. This reduces the risk of dizzy or fainting spells. Avoid alcoholic drinks while you are taking this medicine and for three days afterward. Alcohol may make you feel dizzy, sick, or flushed. If you are being treated for a sexually transmitted disease, avoid sexual contact until you have finished your treatment. Your sexual partner may also need treatment. What side effects may I notice from receiving this medicine? Side effects that you should report to your doctor or health care professional as soon as possible: -allergic reactions like skin rash or hives, swelling of the face, lips, or tongue -confusion, clumsiness -difficulty  speaking -discolored or sore mouth -dizziness -fever, infection -numbness, tingling, pain or weakness in the hands or feet -trouble passing urine or change in the amount of urine -redness, blistering, peeling or loosening of the skin, including inside the mouth -seizures -unusually weak or tired -vaginal irritation, dryness, or discharge Side effects that usually do not require medical attention (report to your doctor or health care professional if they continue or are bothersome): -diarrhea -headache -irritability -metallic taste -nausea -stomach pain or cramps -trouble sleeping This list may not describe all possible side effects. Call your doctor for medical advice about side effects. You may report side effects to FDA at 1-800-FDA-1088. Where should I keep my medicine? Keep out of the reach of children. Store at room temperature below 25 degrees C (77 degrees F). Protect from light. Keep container tightly closed. Throw away any unused medicine after the expiration date. NOTE: This sheet is a summary. It may not cover all possible information. If you have questions about this medicine, talk to your doctor, pharmacist, or health care provider.    2016, [employer]/Gold Standard. (2013-01-09 14:08:39)

## 2015-11-24 LAB — PAP, TP IMAGING W/ HPV RNA, RFLX HPV TYPE 16,18/45: HPV MRNA, HIGH RISK: NOT DETECTED

## 2015-12-22 ENCOUNTER — Encounter: Payer: Medicare PPO | Admitting: Gynecology

## 2016-01-12 ENCOUNTER — Encounter: Payer: Medicare Other | Admitting: Gynecology

## 2016-02-02 ENCOUNTER — Ambulatory Visit (INDEPENDENT_AMBULATORY_CARE_PROVIDER_SITE_OTHER): Payer: Medicare Other | Admitting: Gynecology

## 2016-02-02 ENCOUNTER — Encounter: Payer: Self-pay | Admitting: Gynecology

## 2016-02-02 VITALS — BP 134/88 | Ht 67.0 in | Wt 213.0 lb

## 2016-02-02 DIAGNOSIS — K591 Functional diarrhea: Secondary | ICD-10-CM | POA: Diagnosis not present

## 2016-02-02 DIAGNOSIS — Z01419 Encounter for gynecological examination (general) (routine) without abnormal findings: Secondary | ICD-10-CM | POA: Diagnosis not present

## 2016-02-02 DIAGNOSIS — M8580 Other specified disorders of bone density and structure, unspecified site: Secondary | ICD-10-CM | POA: Diagnosis not present

## 2016-02-02 DIAGNOSIS — Z4689 Encounter for fitting and adjustment of other specified devices: Secondary | ICD-10-CM | POA: Diagnosis not present

## 2016-02-02 DIAGNOSIS — N814 Uterovaginal prolapse, unspecified: Secondary | ICD-10-CM

## 2016-02-02 NOTE — Patient Instructions (Signed)

## 2016-02-02 NOTE — Progress Notes (Signed)
[name] 05/18/1941 [id number]   History:    75 y.o.  for annual gyn exam with complaint of several weeks of very loose stool especially in the morning. Patient denies having traveled overseas or any change in diet or reading in any unusual restaurant. No other family member with similar complaints. Patient denies any fever, chills, nausea, or vomiting. Her PCP is Dr. Kirby FunkJohn Griffin who is been doing her blood work. She stated she had a bone density study in 2014 for which we have no report. Bone density study in 2010 did indicate that she had history of osteopenia. Patient with a Gellhorn pessary which is help with her symptomatic second-degree cystocele and second-degree uterine prolapse. Patient with no GU complaints. She is not using any estrogen which she takes the pessary out once a week and uses only K-Y jelly. She denies any vaginal bleeding. Patient with prior history of osteoporosis had been treated with Fosamax from 2006-2011 and has been on a drug holiday.She is taking 1200 mg of calcium daily and at 1000 units of vitamin D daily and states that she will irregularly exercises. Patient denies any past history of abnormal Pap smear. 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   Past medical history,surgical history, family history and social history were all reviewed and documented in the EPIC chart.  Gynecologic History No LMP recorded. Patient is postmenopausal. Contraception: post menopausal status Last Pap: 2011. Results were: normal Last mammogram: 2017. Results were: normal  Obstetric History OB History  Gravida Para Term Preterm AB Living  3 3 3     4   SAB TAB Ectopic Multiple Live Births        1      # Outcome Date GA Lbr Len/2nd Weight Sex Delivery Anes PTL Lv  3 Term           2 Term           1 Term                ROS: A ROS was performed and pertinent positives and negatives are included  in the history.  GENERAL: No fevers or chills. HEENT: No change in vision, no earache, sore throat or sinus congestion. NECK: No pain or stiffness. CARDIOVASCULAR: No chest pain or pressure. No palpitations. PULMONARY: No shortness of breath, cough or wheeze. GASTROINTESTINAL: No abdominal pain, nausea, vomiting or diarrhea, melena or bright red blood per rectum. GENITOURINARY: No urinary frequency, urgency, hesitancy or dysuria. MUSCULOSKELETAL: No joint or muscle pain, no back pain, no recent trauma. DERMATOLOGIC: No rash, no itching, no lesions. ENDOCRINE: No polyuria, polydipsia, no heat or cold intolerance. No recent change in weight. HEMATOLOGICAL: No anemia or easy bruising or bleeding. NEUROLOGIC: No headache, seizures, numbness, tingling or weakness. PSYCHIATRIC: No depression, no loss of interest in normal activity or change in sleep pattern.     Exam: chaperone present  BP 134/88   Ht 5\' 7"  (1.702 m)   Wt 213 lb (96.6 kg)   BMI 33.36 kg/m   Body mass index is 33.36 kg/m.  General appearance : Well developed well nourished female. No acute distress HEENT: Eyes: no retinal hemorrhage or exudates,  Neck supple, trachea midline, no carotid bruits, no thyroidmegaly Lungs: Clear to auscultation, no rhonchi or wheezes, or rib retractions  Heart: Regular rate and rhythm, no murmurs or gallops Breast:Examined in sitting and supine position were  symmetrical in appearance, no palpable masses or tenderness,  no skin retraction, no nipple inversion, no nipple discharge, no skin discoloration, no axillary or supraclavicular lymphadenopathy Abdomen: no palpable masses or tenderness, no rebound or guarding Extremities: no edema or skin discoloration or tenderness  Pelvic:  Bartholin, Urethra, Skene Glands: Within normal limits             Vagina: No gross lesions or discharge  Cervix: No gross lesions or discharge  Uterus  second-degree cystocele, second-degree uterine prolapse, normal size,  shape and consistency, non-tender and mobile  Adnexa  Without masses or tenderness  Anus and perineum  normal   Rectovaginal  normal sphincter tone without palpated masses or tenderness             Hemoccult PCP provides     Assessment/Plan:  75 y.o. female for annual exam doing well with Gellhorn pessary for her second-degree cystocele and uterine prolapse. The pessary was removed and cleaned before placing back into the vagina and inspecting the vaginal mucosa and cervix which demonstrated no erosion or any abnormality. Patient will continue to clean herself once a week. Patient stated that she has had stool lower loose for the past month that she discuss it with her PCP. We spent time reviewing all her medications she is no longer taking the Colace twice a day or the MiraLAX daily. She is going to maintain a diary and see if there is something in her foods that she's taking so that she can eliminated. Meanwhile we'll going to cut her Prozac from 40 daily which she is taking which may be a contributing factor to her diarrhea as one of the side effects that were described with the use of this medication and see if that will improve her symptoms while taking onl20 mg of the Prozac which she takes mostly for anxiety at times. I had offered her to submit a sample for stool in over and parasite she would like to try the above first if not she has promised that she will go to the gastroenterologist who did her colonoscopy 2 years ago for further evaluation. An additional 15 minutes aside from her examination was taken to clean her pessary and go over her medication list and make the recommendation for her diarrhea. Of note she has not been on any antibiotic either. Pap smear no longer indicated. Patient to schedule her bone density study this year. We discussed importance of calcium vitamin D and weightbearing exercise for osteoporosis prevention.    Ok EdwardsFERNANDEZ,Ahren Pettinger H MD, 4:05 PM 02/02/2016

## 2016-03-19 ENCOUNTER — Ambulatory Visit (INDEPENDENT_AMBULATORY_CARE_PROVIDER_SITE_OTHER): Payer: Medicare Other | Admitting: Gynecology

## 2016-03-19 ENCOUNTER — Encounter: Payer: Self-pay | Admitting: Gynecology

## 2016-03-19 VITALS — BP 128/86

## 2016-03-19 DIAGNOSIS — N644 Mastodynia: Secondary | ICD-10-CM

## 2016-03-19 DIAGNOSIS — N898 Other specified noninflammatory disorders of vagina: Secondary | ICD-10-CM | POA: Diagnosis not present

## 2016-03-19 LAB — WET PREP FOR TRICH, YEAST, CLUE
Trich, Wet Prep: NONE SEEN
Yeast Wet Prep HPF POC: NONE SEEN

## 2016-03-19 MED ORDER — METRONIDAZOLE 500 MG PO TABS: 500.0000 mg | ORAL_TABLET | Freq: Two times a day (BID) | ORAL | 0 refills | Status: DC

## 2016-03-19 NOTE — Progress Notes (Signed)
   Patient is a 75 year old was been complaining several days of vaginal odor and clear discharge. Patient has a second-degree cystocele and second-degree rectocele and has done great with her Gellhorn pessary. Patient is sent inactive has not changed sexual partner. Patient with no dysuria or frequency. No fever, chills, nausea, vomiting or any back pain.  Exam: Gen. appearance well-developed well-nourished in early above-mentioned complaining Pelvic exam: Bartholin urethra Skene was within normal limits External genitalia no gross lesions on inspection Vagina: Clear-like discharge minimal odor no gross lesions Cervix: No gross lesions on inspection Bimanual exam: Not done  Wet prep few clue cells, few white blood cells and many bacteria  Assessment/plan: Patient with clinical evidence of bacterial vaginosis. Patient will be provided with a prescription for Flagyl 500 mg take 1 tablet by mouth twice a day for 7 days.

## 2016-03-19 NOTE — Patient Instructions (Signed)
Breast Tenderness Breast tenderness is a common problem for women of all ages. Breast tenderness may cause mild discomfort to severe pain. It has a variety of causes. Your health care provider will find out the likely cause of your breast tenderness by examining your breasts, asking you about symptoms, and ordering some tests. Breast tenderness usually does not mean you have breast cancer. HOME CARE INSTRUCTIONS  Breast tenderness often can be handled at home. You can try:  Getting fitted for a new bra that provides more support, especially during exercise.  Wearing a more supportive bra or sports bra while sleeping when your breasts are very tender.  If you have a breast injury, apply ice to the area:  Put ice in a plastic bag.  Place a towel between your skin and the bag.  Leave the ice on for 20 minutes, 2-3 times a day.  If your breasts are too full of milk as a result of breastfeeding, try:  Expressing milk either by hand or with a breast pump.  Applying a warm compress to the breasts for relief.  Taking over-the-counter pain relievers, if approved by your health care provider.  Taking other medicines that your health care provider prescribes. These may include antibiotic medicines or birth control pills. Over the long term, your breast tenderness might be eased if you:  Cut down on caffeine.  Reduce the amount of fat in your diet. Keep a log of the days and times when your breasts are most tender. This will help you and your health care provider find the cause of the tenderness and how to relieve it. Also, learn how to do breast exams at home. This will help you notice if you have an unusual growth or lump that could cause tenderness. SEEK MEDICAL CARE IF:   Any part of your breast is hard, red, and hot to the touch. This could be a sign of infection.  Fluid is coming out of your nipples (and you are not breastfeeding). Especially watch for blood or pus.  You have a fever  as well as breast tenderness.  You have a new or painful lump in your breast that remains after your menstrual period ends.  You have tried to take care of the pain at home, but it has not gone away.  Your breast pain is getting worse, or the pain is making it hard to do the things you usually do during your day.   This information is not intended to replace advice given to you by your health care provider. Make sure you discuss any questions you have with your health care provider.   Document Released: 05/17/2008 Document Revised: 02/04/2013 Document Reviewed: 01/01/2013 Elsevier Interactive Patient Education 2016 [employer] Metronidazole tablets or capsules What is this medicine? METRONIDAZOLE (me troe NI da zole) is an antiinfective. It is used to treat certain kinds of bacterial and protozoal infections. It will not work for colds, flu, or other viral infections. This medicine may be used for other purposes; ask your health care provider or pharmacist if you have questions. What should I tell my health care provider before I take this medicine? They need to know if you have any of these conditions: -anemia or other blood disorders -disease of the nervous system -fungal or yeast infection -if you drink alcohol containing drinks -liver disease -seizures -an unusual or allergic reaction to metronidazole, or other medicines, foods, dyes, or preservatives -pregnant or trying to get pregnant -breast-feeding How should I use  this medicine? Take this medicine by mouth with a full glass of water. Follow the directions on the prescription label. Take your medicine at regular intervals. Do not take your medicine more often than directed. Take all of your medicine as directed even if you think you are better. Do not skip doses or stop your medicine early. Talk to your pediatrician regarding the use of this medicine in children. Special care may be needed. Overdosage: If you think you have  taken too much of this medicine contact a poison control center or emergency room at once. NOTE: This medicine is only for you. Do not share this medicine with others. What if I miss a dose? If you miss a dose, take it as soon as you can. If it is almost time for your next dose, take only that dose. Do not take double or extra doses. What may interact with this medicine? Do not take this medicine with any of the following medications: -alcohol or any product that contains alcohol -amprenavir oral solution -cisapride -disulfiram -dofetilide -dronedarone -paclitaxel injection -pimozide -ritonavir oral solution -sertraline oral solution -sulfamethoxazole-trimethoprim injection -thioridazine -ziprasidone This medicine may also interact with the following medications: -birth control pills -cimetidine -lithium -other medicines that prolong the QT interval (cause an abnormal heart rhythm) -phenobarbital -phenytoin -warfarin This list may not describe all possible interactions. Give your health care provider a list of all the medicines, herbs, non-prescription drugs, or dietary supplements you use. Also tell them if you smoke, drink alcohol, or use illegal drugs. Some items may interact with your medicine. What should I watch for while using this medicine? Tell your doctor or health care professional if your symptoms do not improve or if they get worse. You may get drowsy or dizzy. Do not drive, use machinery, or do anything that needs mental alertness until you know how this medicine affects you. Do not stand or sit up quickly, especially if you are an older patient. This reduces the risk of dizzy or fainting spells. Avoid alcoholic drinks while you are taking this medicine and for three days afterward. Alcohol may make you feel dizzy, sick, or flushed. If you are being treated for a sexually transmitted disease, avoid sexual contact until you have finished your treatment. Your sexual  partner may also need treatment. What side effects may I notice from receiving this medicine? Side effects that you should report to your doctor or health care professional as soon as possible: -allergic reactions like skin rash or hives, swelling of the face, lips, or tongue -confusion, clumsiness -difficulty speaking -discolored or sore mouth -dizziness -fever, infection -numbness, tingling, pain or weakness in the hands or feet -trouble passing urine or change in the amount of urine -redness, blistering, peeling or loosening of the skin, including inside the mouth -seizures -unusually weak or tired -vaginal irritation, dryness, or discharge Side effects that usually do not require medical attention (report to your doctor or health care professional if they continue or are bothersome): -diarrhea -headache -irritability -metallic taste -nausea -stomach pain or cramps -trouble sleeping This list may not describe all possible side effects. Call your doctor for medical advice about side effects. You may report side effects to FDA at 1-800-FDA-1088. Where should I keep my medicine? Keep out of the reach of children. Store at room temperature below 25 degrees C (77 degrees F). Protect from light. Keep container tightly closed. Throw away any unused medicine after the expiration date. NOTE: This sheet is a summary. It may not  cover all possible information. If you have questions about this medicine, talk to your doctor, pharmacist, or health care provider.    2016, [employer] Standard. (2013-01-09 14:08:39) Brompheniramine oral suspension What is this medicine? BROMPHENIRAMINE (brome fen IR a meen) is an antihistamine. It is used to treat the symptoms of seasonal and year-round allergies. It is also used to treat a runny nose from a cold. This medicine may be used for other purposes; ask your health care provider or pharmacist if you have questions. What should I tell my health care  provider before I take this medicine? They need to know if you have any of these conditions: -asthma -glaucoma -heart disease -high blood pressure -prostate disease -stomach ulcer -taking MAOIs like Carbex, Eldepryl, Marplan, Nardil, and Parnate -trouble passing urine -thyroid disease -an unusual or allergic reaction to brompheniramine, tartrazine, other medicines, foods, dyes, or preservatives -pregnant or trying to get pregnant -breast-feeding How should I use this medicine? Take this medicine by mouth. Follow the directions on the prescription label. Shake well before using. Use a specially marked spoon or container to measure each dose. Ask your pharmacist if you do not have one. Household spoons are not accurate. Take your medicine at regular intervals. Do not take your medicine more often than directed. Talk to your pediatrician regarding the use of this medicine in children. While this drug may be prescribed for children as young as 58 months old for selected conditions, precautions do apply. Overdosage: If you think you have taken too much of this medicine contact a poison control center or emergency room at once. NOTE: This medicine is only for you. Do not share this medicine with others. What if I miss a dose? If you miss a dose, take it as soon as you can. If it is almost time for your next dose, take only that dose. Do not take double or extra doses. What may interact with this medicine? Do not take this medicine with any of the following medications: -MAOIs like Carbex, Eldepryl, Marplan, Nardil, and Parnate This medicine may also interact with the following medications: -alcohol -barbiturates, like phenobarbital -medicines for bladder spasm like oxybutynin, tolterodine -medicines for blood pressure -medicines for depression, anxiety, or psychotic disturbances -medicines for movement abnormalities or Parkinson's disease -medicines for sleep -other medicines for cold,  cough or allergy -some medicines for the stomach like chlordiazepoxide, dicyclomine This list may not describe all possible interactions. Give your health care provider a list of all the medicines, herbs, non-prescription drugs, or dietary supplements you use. Also tell them if you smoke, drink alcohol, or use illegal drugs. Some items may interact with your medicine. What should I watch for while using this medicine? Visit your doctor or health care professional for regular check ups. Tell your doctor or healthcare professional if your symptoms do not start to get better or if they get worse. Your mouth may get dry. Chewing sugarless gum or sucking hard candy, and drinking plenty of water may help. Contact your doctor if the problem does not go away or is severe. This medicine may cause dry eyes and blurred vision. If you wear contact lenses you may feel some discomfort. Lubricating drops may help. See your eye doctor if the problem does not go away or is severe. You may get drowsy or dizzy. Do not drive, use machinery, or do anything that needs mental alertness until you know how this medicine affects you. Do not stand or sit up quickly, especially if you are  an older patient. This reduces the risk of dizzy or fainting spells. Alcohol may interfere with the effect of this medicine. Avoid alcoholic drinks. What side effects may I notice from receiving this medicine? Side effects that you should report to your doctor or health care professional as soon as possible: -allergic reactions like skin rash, itching or hives, swelling of the face, lips, or tongue -breathing problems -changes in vision -chest pain -fast, irregular heartbeat -fear, anxiety, restless, tremor -fever, sore throat -hallucinations -high blood pressure -seizures -trouble passing urine -unusual bleeding or bruising -unusually weak or tired Side effects that usually do not require medical attention (report to your doctor or  health care professional if they continue or are bothersome): -clumsy -dry mouth, nose, throat -flushed, red skin -headache -loss of appetite -stomach upset, nausea This list may not describe all possible side effects. Call your doctor for medical advice about side effects. You may report side effects to FDA at 1-800-FDA-1088. Where should I keep my medicine? Keep out of the reach of children. Store at room temperature between 15 and 30 degrees C (59 and 86 degrees F). Throw away any unused medicine after the expiration date. NOTE: This sheet is a summary. It may not cover all possible information. If you have questions about this medicine, talk to your doctor, pharmacist, or health care provider.    2016, [employer] Standard. (2007-09-03 14:55:17)

## 2016-03-20 LAB — GC/CHLAMYDIA PROBE AMP
CT PROBE, AMP APTIMA: NOT DETECTED
GC Probe RNA: NOT DETECTED

## 2016-03-21 ENCOUNTER — Encounter: Payer: Self-pay | Admitting: Anesthesiology

## 2016-04-03 ENCOUNTER — Telehealth: Payer: Self-pay | Admitting: *Deleted

## 2016-04-03 MED ORDER — CLINDAMYCIN PHOSPHATE 2 % VA CREA: TOPICAL_CREAM | VAGINAL | 0 refills | Status: DC

## 2016-04-03 MED ORDER — FLUCONAZOLE 150 MG PO TABS: 150.0000 mg | ORAL_TABLET | Freq: Once | ORAL | 0 refills | Status: AC

## 2016-04-03 NOTE — Telephone Encounter (Signed)
Pt informed with the below note, Rx sent. 

## 2016-04-03 NOTE — Telephone Encounter (Signed)
Call in prescription for Cleocin vaginal cream to apply daily at bedtime for 7 days. Also: 1 Diflucan 150 mg one by mouth

## 2016-04-03 NOTE — Telephone Encounter (Signed)
Pt called c/o vaginal odor and slight vaginal discharge, was seen on office visit 03/19/16 was prescribed  flagyl 500 mg twice daily x 7 days. States odor and discharge never fully went away. Pt asked if she could have refill on Rx due to symptoms? Please advise

## 2016-04-17 ENCOUNTER — Ambulatory Visit (INDEPENDENT_AMBULATORY_CARE_PROVIDER_SITE_OTHER): Payer: Medicare Other | Admitting: Women's Health

## 2016-04-17 ENCOUNTER — Encounter: Payer: Self-pay | Admitting: Women's Health

## 2016-04-17 VITALS — BP 130/80 | Ht 67.0 in | Wt 213.0 lb

## 2016-04-17 DIAGNOSIS — N898 Other specified noninflammatory disorders of vagina: Secondary | ICD-10-CM | POA: Diagnosis not present

## 2016-04-17 LAB — WET PREP FOR TRICH, YEAST, CLUE
Clue Cells Wet Prep HPF POC: NONE SEEN
Trich, Wet Prep: NONE SEEN
Yeast Wet Prep HPF POC: NONE SEEN

## 2016-04-17 NOTE — Progress Notes (Signed)
Presents for test of cure BV. Having mild vaginal discharge and odor, odor mostly resolved. Was treated October 2 with Cleocin but did not pickup due to cost and was treated with Flagyl 500 twice daily for 7 days. Uses a Gellhorn pessary that she removes and washes weekly, rare sexual activity husbands health and decreased libido. Denies urinary symptoms, abdominal pain or fever.  Exam: Appears well. External genitalia within normal limits, mild vaginal atrophy, speculum exam vaginal walls weak. No visible erythema, discharge, odor noted, wet prep negative. Bimanual mild uterine prolapse easily reducible. Nontender.  Gellhorn pessary for uterine prolapse Resolved BV  Plan: Continue removing pessary and washing weekly. Reviewed normality of exam and wet prep. Reassurance given infection has healed.

## 2016-08-30 ENCOUNTER — Other Ambulatory Visit: Payer: Self-pay | Admitting: Gynecology

## 2016-09-24 ENCOUNTER — Encounter: Payer: Self-pay | Admitting: Gynecology

## 2016-10-31 ENCOUNTER — Encounter: Payer: Self-pay | Admitting: Gynecology

## 2016-11-26 ENCOUNTER — Ambulatory Visit: Payer: Medicare Other | Admitting: Gynecology

## 2016-11-30 DIAGNOSIS — Z0289 Encounter for other administrative examinations: Secondary | ICD-10-CM

## 2016-12-04 ENCOUNTER — Encounter: Payer: Self-pay | Admitting: Gynecology

## 2016-12-04 ENCOUNTER — Ambulatory Visit (INDEPENDENT_AMBULATORY_CARE_PROVIDER_SITE_OTHER): Payer: Medicare Other | Admitting: Gynecology

## 2016-12-04 VITALS — BP 110/78

## 2016-12-04 DIAGNOSIS — N898 Other specified noninflammatory disorders of vagina: Secondary | ICD-10-CM

## 2016-12-04 DIAGNOSIS — N760 Acute vaginitis: Secondary | ICD-10-CM | POA: Diagnosis not present

## 2016-12-04 DIAGNOSIS — B9689 Other specified bacterial agents as the cause of diseases classified elsewhere: Secondary | ICD-10-CM | POA: Diagnosis not present

## 2016-12-04 LAB — WET PREP FOR TRICH, YEAST, CLUE
Trich, Wet Prep: NONE SEEN
Yeast Wet Prep HPF POC: NONE SEEN

## 2016-12-04 NOTE — Patient Instructions (Addendum)
Metronidazole tablets or capsules What is this medicine? METRONIDAZOLE (me troe NI da zole) is an antiinfective. It is used to treat certain kinds of bacterial and protozoal infections. It will not work for colds, flu, or other viral infections. This medicine may be used for other purposes; ask your health care provider or pharmacist if you have questions. COMMON BRAND NAME(S): Flagyl What should I tell my health care provider before I take this medicine? They need to know if you have any of these conditions: -anemia or other blood disorders -disease of the nervous system -fungal or yeast infection -if you drink alcohol containing drinks -liver disease -seizures -an unusual or allergic reaction to metronidazole, or other medicines, foods, dyes, or preservatives -pregnant or trying to get pregnant -breast-feeding How should I use this medicine? Take this medicine by mouth with a full glass of water. Follow the directions on the prescription label. Take your medicine at regular intervals. Do not take your medicine more often than directed. Take all of your medicine as directed even if you think you are better. Do not skip doses or stop your medicine early. Talk to your pediatrician regarding the use of this medicine in children. Special care may be needed. Overdosage: If you think you have taken too much of this medicine contact a poison control center or emergency room at once. NOTE: This medicine is only for you. Do not share this medicine with others. What if I miss a dose? If you miss a dose, take it as soon as you can. If it is almost time for your next dose, take only that dose. Do not take double or extra doses. What may interact with this medicine? Do not take this medicine with any of the following medications: -alcohol or any product that contains alcohol -amprenavir oral solution -cisapride -disulfiram -dofetilide -dronedarone -paclitaxel injection -pimozide -ritonavir oral  solution -sertraline oral solution -sulfamethoxazole-trimethoprim injection -thioridazine -ziprasidone This medicine may also interact with the following medications: -birth control pills -cimetidine -lithium -other medicines that prolong the QT interval (cause an abnormal heart rhythm) -phenobarbital -phenytoin -warfarin This list may not describe all possible interactions. Give your health care provider a list of all the medicines, herbs, non-prescription drugs, or dietary supplements you use. Also tell them if you smoke, drink alcohol, or use illegal drugs. Some items may interact with your medicine. What should I watch for while using this medicine? Tell your doctor or health care professional if your symptoms do not improve or if they get worse. You may get drowsy or dizzy. Do not drive, use machinery, or do anything that needs mental alertness until you know how this medicine affects you. Do not stand or sit up quickly, especially if you are an older patient. This reduces the risk of dizzy or fainting spells. Avoid alcoholic drinks while you are taking this medicine and for three days afterward. Alcohol may make you feel dizzy, sick, or flushed. If you are being treated for a sexually transmitted disease, avoid sexual contact until you have finished your treatment. Your sexual partner may also need treatment. What side effects may I notice from receiving this medicine? Side effects that you should report to your doctor or health care professional as soon as possible: -allergic reactions like skin rash or hives, swelling of the face, lips, or tongue -confusion, clumsiness -difficulty speaking -discolored or sore mouth -dizziness -fever, infection -numbness, tingling, pain or weakness in the hands or feet -trouble passing urine or change in the amount of   urine -redness, blistering, peeling or loosening of the skin, including inside the mouth -seizures -unusually weak or  tired -vaginal irritation, dryness, or discharge Side effects that usually do not require medical attention (report to your doctor or health care professional if they continue or are bothersome): -diarrhea -headache -irritability -metallic taste -nausea -stomach pain or cramps -trouble sleeping This list may not describe all possible side effects. Call your doctor for medical advice about side effects. You may report side effects to FDA at 1-800-FDA-1088. Where should I keep my medicine? Keep out of the reach of children. Store at room temperature below 25 degrees C (77 degrees F). Protect from light. Keep container tightly closed. Throw away any unused medicine after the expiration date. NOTE: This sheet is a summary. It may not cover all possible information. If you have questions about this medicine, talk to your doctor, pharmacist, or health care provider.  2018 Elsevier/Gold Standard (2013-01-09 14:08:39) Bacterial Vaginosis Bacterial vaginosis is a vaginal infection that occurs when the normal balance of bacteria in the vagina is disrupted. It results from an overgrowth of certain bacteria. This is the most common vaginal infection among women ages 15-44. Because bacterial vaginosis increases your risk for STIs (sexually transmitted infections), getting treated can help reduce your risk for chlamydia, gonorrhea, herpes, and HIV (human immunodeficiency virus). Treatment is also important for preventing complications in pregnant women, because this condition can cause an early (premature) delivery. What are the causes? This condition is caused by an increase in harmful bacteria that are normally present in small amounts in the vagina. However, the reason that the condition develops is not fully understood. What increases the risk? The following factors may make you more likely to develop this condition:  Having a new sexual partner or multiple sexual partners.  Having unprotected  sex.  Douching.  Having an intrauterine device (IUD).  Smoking.  Drug and alcohol abuse.  Taking certain antibiotic medicines.  Being pregnant.  You cannot get bacterial vaginosis from toilet seats, bedding, swimming pools, or contact with objects around you. What are the signs or symptoms? Symptoms of this condition include:  Grey or white vaginal discharge. The discharge can also be watery or foamy.  A fish-like odor with discharge, especially after sexual intercourse or during menstruation.  Itching in and around the vagina.  Burning or pain with urination.  Some women with bacterial vaginosis have no signs or symptoms. How is this diagnosed? This condition is diagnosed based on:  Your medical history.  A physical exam of the vagina.  Testing a sample of vaginal fluid under a microscope to look for a large amount of bad bacteria or abnormal cells. Your health care provider may use a cotton swab or a small wooden spatula to collect the sample.  How is this treated? This condition is treated with antibiotics. These may be given as a pill, a vaginal cream, or a medicine that is put into the vagina (suppository). If the condition comes back after treatment, a second round of antibiotics may be needed. Follow these instructions at home: Medicines  Take over-the-counter and prescription medicines only as told by your health care provider.  Take or use your antibiotic as told by your health care provider. Do not stop taking or using the antibiotic even if you start to feel better. General instructions  If you have a female sexual partner, tell her that you have a vaginal infection. She should see her health care provider and be treated if   she has symptoms. If you have a female sexual partner, he does not need treatment.  During treatment: ? Avoid sexual activity until you finish treatment. ? Do not douche. ? Avoid alcohol as directed by your health care provider. ? Avoid  breastfeeding as directed by your health care provider.  Drink enough water and fluids to keep your urine clear or pale yellow.  Keep the area around your vagina and rectum clean. ? Wash the area daily with warm water. ? Wipe yourself from front to back after using the toilet.  Keep all follow-up visits as told by your health care provider. This is important. How is this prevented?  Do not douche.  Wash the outside of your vagina with warm water only.  Use protection when having sex. This includes latex condoms and dental dams.  Limit how many sexual partners you have. To help prevent bacterial vaginosis, it is best to have sex with just one partner (monogamous).  Make sure you and your sexual partner are tested for STIs.  Wear cotton or cotton-lined underwear.  Avoid wearing tight pants and pantyhose, especially during summer.  Limit the amount of alcohol that you drink.  Do not use any products that contain nicotine or tobacco, such as cigarettes and e-cigarettes. If you need help quitting, ask your health care provider.  Do not use illegal drugs. Where to find more information:  Centers for Disease Control and Prevention: www.cdc.gov/std  American Sexual Health Association (ASHA): www.ashastd.org  [place] Department of Health and Human Services, Office on Women's Health: www.womenshealth.gov/ or https://www.womenshealth.gov/a-z-topics/bacterial-vaginosis Contact a health care provider if:  Your symptoms do not improve, even after treatment.  You have more discharge or pain when urinating.  You have a fever.  You have pain in your abdomen.  You have pain during sex.  You have vaginal bleeding between periods. Summary  Bacterial vaginosis is a vaginal infection that occurs when the normal balance of bacteria in the vagina is disrupted.  Because bacterial vaginosis increases your risk for STIs (sexually transmitted infections), getting treated can help reduce your  risk for chlamydia, gonorrhea, herpes, and HIV (human immunodeficiency virus). Treatment is also important for preventing complications in pregnant women, because the condition can cause an early (premature) delivery.  This condition is treated with antibiotic medicines. These may be given as a pill, a vaginal cream, or a medicine that is put into the vagina (suppository). This information is not intended to replace advice given to you by your health care provider. Make sure you discuss any questions you have with your health care provider. Document Released: 06/04/2005 Document Revised: 02/18/2016 Document Reviewed: 02/18/2016 Elsevier Interactive Patient Education  2017 Elsevier Inc.  

## 2016-12-04 NOTE — Progress Notes (Signed)
   Patient is a 76 year old that presented to the office complaining a few days with history of vaginal odor and yellowish discharge.Patient with a Gellhorn pessary which is help with her symptomatic second-degree cystocele and second-degree uterine prolapse. (She uses KY gel when she cleans her pessary once a week)  Exam: Pelvic: Bartholin urethra Skene glands with atrophic changes Vagina: Clear discharge fishy odor wet prep obtained. Second-degree cystocele, second-degree uterine prolapse Bimanual exam not done Rectal exam: Not done  Wet prep: Few clue cells, many white blood cell, many bacteria  Assessment/plan: Clinical evidence of bacterial vaginosis will be treated with Flagyl 500 mg twice a day for 7 days. I have recommended the patient take out her pessary twice week to clean instead of once a week. She is scheduled for annual exam the end of August this year.

## 2016-12-05 ENCOUNTER — Telehealth: Payer: Self-pay | Admitting: *Deleted

## 2016-12-05 MED ORDER — METRONIDAZOLE 500 MG PO TABS: 500.0000 mg | ORAL_TABLET | Freq: Two times a day (BID) | ORAL | 0 refills | Status: DC

## 2016-12-05 NOTE — Telephone Encounter (Signed)
Patient was seen yesterday for OV Rx for Flagyl 500 mg twice a day for 7 days. Was never sent to pharmacy. Rx sent today, pt aware.

## 2016-12-18 ENCOUNTER — Telehealth: Payer: Self-pay | Admitting: *Deleted

## 2016-12-18 MED ORDER — TINIDAZOLE 500 MG PO TABS: ORAL_TABLET | ORAL | 0 refills | Status: DC

## 2016-12-18 NOTE — Telephone Encounter (Signed)
Pt informed, Rx sent. 

## 2016-12-18 NOTE — Telephone Encounter (Signed)
Pt was prescribed flagyl 500 mg #14 tablets, pt said yellowish discharge is not gone completely, still has very light odor, asked if she can have refill on Rx? States better, but note 100%.  Please advise

## 2016-12-18 NOTE — Telephone Encounter (Signed)
You can call in Tindamax 500 mg by mouth 4 tablets today repeat in 24 hours

## 2017-02-07 ENCOUNTER — Encounter: Payer: Medicare Other | Admitting: Obstetrics & Gynecology

## 2017-02-21 ENCOUNTER — Ambulatory Visit (INDEPENDENT_AMBULATORY_CARE_PROVIDER_SITE_OTHER): Payer: Medicare Other | Admitting: Obstetrics & Gynecology

## 2017-02-21 ENCOUNTER — Encounter: Payer: Self-pay | Admitting: Obstetrics & Gynecology

## 2017-02-21 VITALS — BP 134/86

## 2017-02-21 DIAGNOSIS — N898 Other specified noninflammatory disorders of vagina: Secondary | ICD-10-CM | POA: Diagnosis not present

## 2017-02-21 DIAGNOSIS — N814 Uterovaginal prolapse, unspecified: Secondary | ICD-10-CM | POA: Diagnosis not present

## 2017-02-21 DIAGNOSIS — B373 Candidiasis of vulva and vagina: Secondary | ICD-10-CM | POA: Diagnosis not present

## 2017-02-21 DIAGNOSIS — B3731 Acute candidiasis of vulva and vagina: Secondary | ICD-10-CM

## 2017-02-21 LAB — WET PREP FOR TRICH, YEAST, CLUE

## 2017-02-21 MED ORDER — FLUCONAZOLE 150 MG PO TABS: 150.0000 mg | ORAL_TABLET | Freq: Every day | ORAL | 1 refills | Status: AC

## 2017-02-21 NOTE — Progress Notes (Signed)
    Brittany SetonHurlyn D Koch 09/16/1940 [id number]        76 y.o.  [id number] Married  RP:  Uterine prolapse with vaginal discharge  HPI:  Had frequent vaginal infections, so decided to stop using the pessary in 11/2016.  Not symptomatic from the uterine/bladder descent, but the vaginal discharge has persisted since removal of the pessary.  Occasional itching, no significant odor.  No pelvic or vaginal pain/discomfort.  No sensation of bulging.  Not seeing anything bulging at the vulva.  Rarely sexually active.  No SUI.  BMs wnl.  Past medical history,surgical history, problem list, medications, allergies, family history and social history were all reviewed and documented in the EPIC chart.  Directed ROS with pertinent positives and negatives documented in the history of present illness/assessment and plan.  Exam:  Vitals:   02/21/17 1222  BP: 134/86   General appearance:  Normal  Gyn exam:  Vulva normal.  Speculum:  Cervix/Vagina wnl.  Bimanual exam:  Uterus AV normal volume/NT/mobile.  No adnexal mass/NT.  Exam done in lithotomy and standing position.  Uterine prolapse grade 2/3 and cystocele grade 2/4 with Valsalva.    Assessment/Plan:  76 y.o. G3P3004   1. Cystocele with uterine prolapse Grade 2/4 cystocele, grade 2/3 uterine prolapse.  Asymptomatic.  Management thoroughly discussed with patient including observation when Sxs are absent or mild, Pessary and Surgical corrections.  Recommend observation at this time as patient is Asymptomatic.  2. Vaginal discharge Yeast vaginitis on wet prep.  Will treat with Fluconazole 150 mg PO daily x 2.  Probiotic 1 tab vaginally weekly recommended for prevention. - WET PREP FOR TRICH, YEAST, CLUE  3. Yeast vaginitis Fluconazole 150/tab 1 tab PO daily x 2.  Counseling on above issues >50% x 25 minutes  Brittany DelMarie-Lyne Pantelis Elgersma MD, 12:27 PM 02/21/2017

## 2017-02-24 NOTE — Patient Instructions (Signed)
1. Cystocele with uterine prolapse Grade 2/4 cystocele, grade 2/3 uterine prolapse.  Asymptomatic.  Management thoroughly discussed with patient including observation when Sxs are absent or mild, Pessary and Surgical corrections.  Recommend observation at this time as patient is Asymptomatic.  2. Vaginal discharge Yeast vaginitis on wet prep.  Will treat with Fluconazole 150 mg PO daily x 2.  Probiotic 1 tab vaginally weekly recommended for prevention. - WET PREP FOR TRICH, YEAST, CLUE  3. Yeast vaginitis Fluconazole 150/tab 1 tab PO daily x 2.  [name], it was a pleasure to meet you today!  I will see you soon for your Annual/Gyn exam.   Pelvic Organ Prolapse Pelvic organ prolapse is the stretching, bulging, or dropping of pelvic organs into an abnormal position. It happens when the muscles and tissues that surround and support pelvic structures are stretched or weak. Pelvic organ prolapse can involve:  Vagina (vaginal prolapse).  Uterus (uterine prolapse).  Bladder (cystocele).  Rectum (rectocele).  Intestines (enterocele).  When organs other than the vagina are involved, they often bulge into the vagina or protrude from the vagina, depending on how severe the prolapse is. What are the causes? Causes of this condition include:  Pregnancy, labor, and childbirth.  Long-lasting (chronic) cough.  Chronic constipation.  Obesity.  Past pelvic surgery.  Aging. During and after menopause, a decreased production of the hormone estrogen can weaken pelvic ligaments and muscles.  Consistently lifting more than 50 lb (23 kg).  Buildup of fluid in the abdomen due to certain diseases and other conditions.  What are the signs or symptoms? Symptoms of this condition include:  Loss of bladder control when you cough, sneeze, strain, and exercise (stress incontinence). This may be worse immediately following childbirth, and it may gradually improve over time.  Feeling pressure in your  pelvis or vagina. This pressure may increase when you cough or when you are having a bowel movement.  A bulge that protrudes from the opening of your vagina or against your vaginal wall. If your uterus protrudes through the opening of your vagina and rubs against your clothing, you may also experience soreness, ulcers, infection, pain, and bleeding.  Increased effort to have a bowel movement or urinate.  Pain in your low back.  Pain, discomfort, or disinterest in sexual intercourse.  Repeated bladder infections (urinary tract infections).  Difficulty inserting or inability to insert a tampon or applicator.  In some people, this condition does not cause any symptoms. How is this diagnosed? Your health care provider may perform an internal and external vaginal and rectal exam. During the exam, you may be asked to cough and strain while you are lying down, sitting, and standing up. Your health care provider will determine if other tests are required, such as bladder function tests. How is this treated? In most cases, this condition needs to be treated only if it produces symptoms. No treatment is guaranteed to correct the prolapse or relieve the symptoms completely. Treatment may include:  Lifestyle changes, such as: ? Avoiding drinking beverages that contain caffeine. ? Increasing your intake of high-fiber foods. This can help to decrease constipation and straining during bowel movements. ? Emptying your bladder at scheduled times (bladder training therapy). This can help to reduce or avoid urinary incontinence. ? Losing weight if you are overweight or obese.  Estrogen. Estrogen may help mild prolapse by increasing the strength and tone of pelvic floor muscles.  Kegel exercises. These may help mild cases of prolapse by strengthening and  tightening the muscles of the pelvic floor.  Pessary insertion. A pessary is a soft, flexible device that is placed into your vagina by your health care  provider to help support the vaginal walls and keep pelvic organs in place.  Surgery. This is often the only form of treatment for severe prolapse. Different types of surgeries are available.  Follow these instructions at home:  Wear a sanitary pad or absorbent product if you have urinary incontinence.  Avoid heavy lifting and straining with exercise and work. Do not hold your breath when you perform mild to moderate lifting and exercise activities. Limit your activities as directed by your health care provider.  Take medicines only as directed by your health care provider.  Perform Kegel exercises as directed by your health care provider.  If you have a pessary, take care of it as directed by your health care provider. Contact a health care provider if:  Your symptoms interfere with your daily activities or sex life.  You need medicine to help with the discomfort.  You notice bleeding from the vagina that is not related to your period.  You have a fever.  You have pain or bleeding when you urinate.  You have bleeding when you have a bowel movement.  You lose urine when you have sex.  You have chronic constipation.  You have a pessary that falls out.  You have vaginal discharge that has a bad smell.  You have low abdominal pain or cramping that is unusual for you. This information is not intended to replace advice given to you by your health care provider. Make sure you discuss any questions you have with your health care provider. Document Released: 12/30/2013 Document Revised: 11/10/2015 Document Reviewed: 08/17/2013 Elsevier Interactive Patient Education  [employer].

## 2017-03-20 ENCOUNTER — Other Ambulatory Visit: Payer: Self-pay

## 2017-03-20 MED ORDER — VALACYCLOVIR HCL 500 MG PO TABS: ORAL_TABLET | ORAL | 0 refills | Status: DC

## 2017-04-05 ENCOUNTER — Encounter: Payer: Self-pay | Admitting: Obstetrics & Gynecology

## 2017-04-05 ENCOUNTER — Ambulatory Visit (INDEPENDENT_AMBULATORY_CARE_PROVIDER_SITE_OTHER): Payer: Medicare Other | Admitting: Obstetrics & Gynecology

## 2017-04-05 VITALS — BP 124/80

## 2017-04-05 DIAGNOSIS — N950 Postmenopausal bleeding: Secondary | ICD-10-CM | POA: Diagnosis not present

## 2017-04-05 DIAGNOSIS — N898 Other specified noninflammatory disorders of vagina: Secondary | ICD-10-CM

## 2017-04-05 LAB — WET PREP FOR TRICH, YEAST, CLUE

## 2017-04-05 MED ORDER — METRONIDAZOLE 0.75 % VA GEL: 1.0000 | Freq: Every day | VAGINAL | 0 refills | Status: AC

## 2017-04-05 NOTE — Progress Notes (Signed)
    [name] 04/06/1941 [id number]        76 y.o.  [id number]   RP:  Vaginal d/c with spotting on and off x 1 month  HPI:  Mild spotting on-off x 1 month.  No pelvic pain.  Mild increase in vaginal discharge.  No itching or odor.  Has a Cystocele/Uterine prolapse, but not bulging at vulva, very little symptoms.  Occasionally sexually active, but no postcoital bleeding.  No UTI Sx.  Past medical history,surgical history, problem list, medications, allergies, family history and social history were all reviewed and documented in the EPIC chart.  Directed ROS with pertinent positives and negatives documented in the history of present illness/assessment and plan.  Exam:  Vitals:   04/05/17 1454  BP: 124/80   General appearance:  Normal  Gyn exam:  Vulva normal.  Speculum:  Cervix to lower 1/3 of vagina.  No polyp/no lesion.  Increased discharge.  No bleeding.  Wet prep done.   Assessment/Plan:  76 y.o. G3P3004   1. Vaginal discharge Many Bacteria and Leukocytes.  No Yeasts/no Clue cells.  Declines STI screen.  Treat with Metrogel x 5 days. - WET PREP FOR TRICH, YEAST, CLUE  2. Postmenopausal bleeding No current bleeding.  No lesion seen.  Will do a Pelvic US to r/o Endometrial Pathology. - US Transvaginal Non-OB; Future  Counseling on above issues >50% x 15 minutes.  Brittany DelMarie-Lyne Harly Pipkins MD, 3:01 PM 04/05/2017

## 2017-04-06 NOTE — Patient Instructions (Signed)
1. Vaginal discharge Many Bacteria and Leukocytes.  No Yeasts/no Clue cells.  Declines STI screen.  Treat with Metrogel x 5 days. - WET PREP FOR TRICH, YEAST, CLUE  2. Postmenopausal bleeding No current bleeding.  No lesion seen.  Will do a Pelvic US to r/o Endometrial Pathology. - US Transvaginal Non-OB; Future  [name], good to see you today!  I will see you again soon for the pelvic US.

## 2017-04-17 ENCOUNTER — Ambulatory Visit (INDEPENDENT_AMBULATORY_CARE_PROVIDER_SITE_OTHER): Payer: Medicare Other

## 2017-04-17 ENCOUNTER — Ambulatory Visit (INDEPENDENT_AMBULATORY_CARE_PROVIDER_SITE_OTHER): Payer: Medicare Other | Admitting: Obstetrics & Gynecology

## 2017-04-17 VITALS — BP 130/84

## 2017-04-17 DIAGNOSIS — N950 Postmenopausal bleeding: Secondary | ICD-10-CM

## 2017-04-17 NOTE — Progress Notes (Signed)
    [name] 04/27/1941 [id number]        76 y.o.  [id number]   RP:  Very light PMB for Pelvic US  HPI:  No vaginal spotting anymore.  No pelvic pain. Urine and BMs wnl.  Past medical history,surgical history, problem list, medications, allergies, family history and social history were all reviewed and documented in the EPIC chart.  Directed ROS with pertinent positives and negatives documented in the history of present illness/assessment and plan.  Exam:  Vitals:   04/17/17 1643  BP: 130/84   General appearance:  Normal  Pelvic US today:  T/V Uterus Retroverted with homogeneous myometrium.  Endometrial line thin at 3.3 mm normal.  Right and left ovaries normal echo, atrophic.  No apparent mass seen in right and left adnexa.  No FF in CDS.     Assessment/Plan:  76 y.o. G3P3004   1. Postmenopausal bleeding Resolved, very mild vaginal spotting.  Probably associated with atrophic vaginitis c/w Cystocele.  Improved since stopped using the pessary.  No pelvic pain.  Pelvic US today showing a thin endometrial line at 3.3 mm.  Reassured.  Rt and Lt ovaries normal.    Counseling on above issues >50% x 15 minutes   Genia DelMarie-Lyne Baleigh Rennaker MD, 10:23 PM 04/17/2017

## 2017-04-17 NOTE — Patient Instructions (Signed)
1. Postmenopausal bleeding Resolved, very mild vaginal spotting.  Probably associated with atrophic vaginitis c/w Cystocele.  Improved since stopped using the pessary.  No pelvic pain.  Pelvic US today showing a thin endometrial line at 3.3 mm.  Reassured.  Rt and Lt ovaries normal.    [name], it was good to see you today!

## 2017-04-23 ENCOUNTER — Other Ambulatory Visit: Payer: Self-pay

## 2017-04-23 MED ORDER — VALACYCLOVIR HCL 500 MG PO TABS: ORAL_TABLET | ORAL | 0 refills | Status: DC

## 2017-07-25 ENCOUNTER — Encounter: Payer: Medicare Other | Admitting: Obstetrics & Gynecology

## 2017-08-21 ENCOUNTER — Other Ambulatory Visit: Payer: Self-pay | Admitting: Obstetrics & Gynecology

## 2017-08-21 NOTE — Telephone Encounter (Signed)
Annual scheduled on 09/19/17

## 2017-09-19 ENCOUNTER — Encounter: Payer: Medicare Other | Admitting: Obstetrics & Gynecology

## 2017-10-07 ENCOUNTER — Other Ambulatory Visit: Payer: Self-pay | Admitting: Internal Medicine

## 2017-10-07 ENCOUNTER — Ambulatory Visit
Admission: RE | Admit: 2017-10-07 | Discharge: 2017-10-07 | Disposition: A | Discharge status: Home / Self Care | Payer: Medicare Other | Source: Ambulatory Visit | Attending: Internal Medicine | Admitting: Internal Medicine

## 2017-10-07 DIAGNOSIS — S99922A Unspecified injury of left foot, initial encounter: Secondary | ICD-10-CM

## 2017-10-07 DIAGNOSIS — W19XXXA Unspecified fall, initial encounter: Secondary | ICD-10-CM

## 2017-10-07 DIAGNOSIS — M25511 Pain in right shoulder: Secondary | ICD-10-CM

## 2017-11-25 ENCOUNTER — Other Ambulatory Visit: Payer: Self-pay | Admitting: Obstetrics & Gynecology

## 2017-11-25 NOTE — Telephone Encounter (Signed)
Overdue for annual exam.. Last CE was 02/02/2016 although several visits for other things with you since then.  At her CE on 02/02/16 Dr. 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

## 2017-12-05 ENCOUNTER — Encounter: Payer: Medicare Other | Admitting: Obstetrics & Gynecology

## 2017-12-16 ENCOUNTER — Ambulatory Visit: Payer: Medicare Other | Admitting: Obstetrics & Gynecology

## 2017-12-16 ENCOUNTER — Encounter: Payer: Self-pay | Admitting: Obstetrics & Gynecology

## 2017-12-16 VITALS — BP 126/70

## 2017-12-16 DIAGNOSIS — N898 Other specified noninflammatory disorders of vagina: Secondary | ICD-10-CM

## 2017-12-16 DIAGNOSIS — N814 Uterovaginal prolapse, unspecified: Secondary | ICD-10-CM | POA: Diagnosis not present

## 2017-12-16 LAB — WET PREP FOR TRICH, YEAST, CLUE: RESULT: 8

## 2017-12-16 NOTE — Progress Notes (Signed)
    Brittany SetonHurlyn D Koch 12/17/1940 [id number]        77 y.o.  [id number]   RP: Complains of mildly increased vaginal secretions and discomfort at the vulva  HPI: Mild increase in vaginal secretions and vulvar discomfort for a few weeks.  No postmenopausal bleeding.  No pelvic pain.  Urine and bowel movements normal.   OB History  Gravida Para Term Preterm AB Living  3 3 3     4   SAB TAB Ectopic Multiple Live Births        1      # Outcome Date GA Lbr Len/2nd Weight Sex Delivery Anes PTL Lv  3 Term           2 Term           1 Term             Past medical history,surgical history, problem list, medications, allergies, family history and social history were all reviewed and documented in the EPIC chart.   Directed ROS with pertinent positives and negatives documented in the history of present illness/assessment and plan.  Exam:  Vitals:   12/16/17 1425  BP: 126/70   General appearance:  Normal  Abdomen: Normal  Gynecologic exam: Vulva normal.  Speculum: Cervix and vagina normal.  No blood.  Mild increase in vaginal secretions.  Wet prep done.  Bimanual exam: Uterus anteverted normal volume and mobile.  No adnexal mass, nontender.  Uterine prolapse 2/3 and cystocele 2/4 with Valsalva.  Wet prep:  Negative   Assessment/Plan:  77 y.o. G3P3004   1. Vaginal discharge Reassured.  May use Probiotic tablet or suppository vaginally weekly as needed. - WET PREP FOR TRICH, YEAST, CLUE  2. Cystocele with uterine prolapse Very mildly symptomatic.  Will observe at this time.  Counseling done on avoiding pelvic pressure including treating cough and constipation as well as avoiding heavy lifting.  Counseling on above issues and coordination of care more than 50% for 15 minutes.  Genia DelMarie-Lyne Allegra Cerniglia MD, 2:47 PM 12/16/2017

## 2017-12-22 ENCOUNTER — Encounter: Payer: Self-pay | Admitting: Obstetrics & Gynecology

## 2017-12-22 NOTE — Patient Instructions (Signed)
1. Vaginal discharge Reassured.  May use Probiotic tablet or suppository vaginally weekly as needed. - WET PREP FOR TRICH, YEAST, CLUE  2. Cystocele with uterine prolapse Very mildly symptomatic.  Will observe at this time.  Counseling done on avoiding pelvic pressure including treating cough and constipation as well as avoiding heavy lifting.  [name], it was a pleasure seeing you today!

## 2018-03-20 ENCOUNTER — Encounter: Payer: Medicare Other | Admitting: Obstetrics & Gynecology

## 2018-04-16 ENCOUNTER — Other Ambulatory Visit: Payer: Self-pay | Admitting: Obstetrics & Gynecology

## 2018-04-18 ENCOUNTER — Other Ambulatory Visit: Payer: Self-pay | Admitting: Obstetrics & Gynecology

## 2018-04-18 MED ORDER — VALACYCLOVIR HCL 500 MG PO TABS: 500.0000 mg | ORAL_TABLET | Freq: Every day | ORAL | 0 refills | Status: DC

## 2018-04-18 NOTE — Addendum Note (Signed)
Addended by: Keenan Bachelor on: 04/18/2018 09:25 AM   Modules accepted: Orders

## 2018-05-06 ENCOUNTER — Encounter: Payer: Self-pay | Admitting: Obstetrics & Gynecology

## 2018-05-06 ENCOUNTER — Ambulatory Visit: Payer: Medicare Other | Admitting: Obstetrics & Gynecology

## 2018-05-06 VITALS — BP 124/86

## 2018-05-06 DIAGNOSIS — N813 Complete uterovaginal prolapse: Secondary | ICD-10-CM | POA: Diagnosis not present

## 2018-05-06 DIAGNOSIS — N898 Other specified noninflammatory disorders of vagina: Secondary | ICD-10-CM

## 2018-05-06 LAB — WET PREP FOR TRICH, YEAST, CLUE

## 2018-05-06 MED ORDER — FLUCONAZOLE 150 MG PO TABS: 150.0000 mg | ORAL_TABLET | Freq: Every day | ORAL | 1 refills | Status: AC

## 2018-05-06 NOTE — Progress Notes (Signed)
    [name] 06/17/1941 [id number]        77 y.o.  [id number]   RP: Vaginal discharge with odor  HPI: Presented for vaginal discharge and vulvar irritation December 16, 2017.  At that time the wet prep was negative.  Currently has more vaginal discharge with odor.  No itching and no irritation at the vulva.  No pelvic pain.  No postmenopausal bleeding.   OB History  Gravida Para Term Preterm AB Living  3 3 3     4   SAB TAB Ectopic Multiple Live Births        1      # Outcome Date GA Lbr Len/2nd Weight Sex Delivery Anes PTL Lv  3 Term           2 Term           1 Term             Past medical history,surgical history, problem list, medications, allergies, family history and social history were all reviewed and documented in the EPIC chart.   Directed ROS with pertinent positives and negatives documented in the history of present illness/assessment and plan.  Exam:  Vitals:   05/06/18 1122  BP: 124/86   General appearance:  Normal  Abdomen: Normal  Gynecologic exam: Vulva normal, except for cervix visible at the vulva.  Speculum:  Cervix normal/Vagina normal.  Normal secretions.  Wet prep done.  Gyn exam in standing position with Valsalva:  Complete Uterine Prolapse with protrusion of cervix about 2 cm passed the vulva.  Wet prep: Yeast vaginitis   Assessment/Plan:  77 y.o. G3P3004   1. Vaginal discharge Yeast vaginitis present, confirmed by wet prep.  Will treat with fluconazole 150 mg/tab., 1 tablet daily for 3 days.  Usage reviewed with patient and prescription sent to pharmacy. - WET PREP FOR TRICH, YEAST, CLUE  2. Complete uterine prolapse Observation preferred by patient at this time.  Avoid excess pressure.  For patient's information, pessary discussed and pamphlet on Sacrocolpopexy given.  Other orders - fluconazole (DIFLUCAN) 150 MG tablet; Take 1 tablet (150 mg total) by mouth daily for 3 days.  Counseling on above issues and coordination of care more  than 50% for 25 minutes.  Genia DelMarie-Lyne Rozell Theiler MD, 11:53 AM 05/06/2018

## 2018-05-12 ENCOUNTER — Encounter: Payer: Self-pay | Admitting: Obstetrics & Gynecology

## 2018-05-12 NOTE — Patient Instructions (Signed)
1. Vaginal discharge Yeast vaginitis present, confirmed by wet prep.  Will treat with fluconazole 150 mg/tab., 1 tablet daily for 3 days.  Usage reviewed with patient and prescription sent to pharmacy. - WET PREP FOR TRICH, YEAST, CLUE  2. Complete uterine prolapse Observation preferred by patient at this time.  Avoid excess pressure.  For patient's information, pessary discussed and pamphlet on Sacrocolpopexy given.  Other orders - fluconazole (DIFLUCAN) 150 MG tablet; Take 1 tablet (150 mg total) by mouth daily for 3 days.  Dennis, it was a pleasure seeing you today!

## 2018-05-19 ENCOUNTER — Telehealth: Payer: Self-pay | Admitting: *Deleted

## 2018-05-19 NOTE — Telephone Encounter (Signed)
Patient was seen on 05/06/18 for vaginal discharge, called back states the brown discharge is still ongoing, very faith odor with discharge. States you told her to call and you will prescribed another medication. Please advise

## 2018-05-20 MED ORDER — METRONIDAZOLE 0.75 % VA GEL: 1.0000 | Freq: Every day | VAGINAL | 0 refills | Status: DC

## 2018-05-20 NOTE — Telephone Encounter (Signed)
Rx sent, left on voicemail Rx sent 

## 2018-05-20 NOTE — Telephone Encounter (Signed)
Please send prescription for Metrogel x 5 days.

## 2018-06-16 ENCOUNTER — Encounter: Payer: Medicare Other | Admitting: Obstetrics & Gynecology

## 2018-07-08 ENCOUNTER — Ambulatory Visit: Payer: Medicare Other | Admitting: Obstetrics & Gynecology

## 2018-07-08 ENCOUNTER — Encounter: Payer: Self-pay | Admitting: Obstetrics & Gynecology

## 2018-07-08 VITALS — BP 130/76

## 2018-07-08 DIAGNOSIS — N898 Other specified noninflammatory disorders of vagina: Secondary | ICD-10-CM | POA: Diagnosis not present

## 2018-07-08 LAB — WET PREP FOR TRICH, YEAST, CLUE

## 2018-07-08 MED ORDER — FLUCONAZOLE 150 MG PO TABS: 150.0000 mg | ORAL_TABLET | Freq: Every day | ORAL | 2 refills | Status: AC

## 2018-07-08 NOTE — Patient Instructions (Signed)
1. Vaginal discharge Yeast vaginitis confirmed by wet prep.  Decision to treat with fluconazole 150 mg/tab. 1 tablet per mouth daily for 3 days.  Refills prescribed.  Can use probiotic tablet vaginally once a week for prevention.  Usage of fluconazole reviewed and prescription sent to pharmacy. - WET PREP FOR TRICH, YEAST, CLUE  2. Vaginal odor As above - WET PREP FOR TRICH, YEAST, CLUE  Other orders - fluconazole (DIFLUCAN) 150 MG tablet; Take 1 tablet (150 mg total) by mouth daily for 3 days.  [name], it was a pleasure seeing you today!

## 2018-07-08 NOTE — Progress Notes (Signed)
    [name] 06/23/1940 [id number]        78 y.o.  [id number] Married  RP: Vaginal discharge with mild odor.  HPI:  Abstinent.  Uterine prolapse grade 3/3, but asymptomatic, declines pessary/surgery.  Recurrent episodes of increased vaginal discharge with mild odor.  Last episode was in 04/2018, wet prep showed yeasts, treated with Fluconazole.  No pelvic pain.  No PMB.   OB History  Gravida Para Term Preterm AB Living  3 3 3     4   SAB TAB Ectopic Multiple Live Births        1      # Outcome Date GA Lbr Len/2nd Weight Sex Delivery Anes PTL Lv  3 Term           2 Term           1 Term             Past medical history,surgical history, problem list, medications, allergies, family history and social history were all reviewed and documented in the EPIC chart.   Directed ROS with pertinent positives and negatives documented in the history of present illness/assessment and plan.  Exam:  Vitals:   07/08/18 0935  BP: 130/76   General appearance:  Normal  Gynecologic exam: Vulva normal, cervix visible at vulva.  Speculum:  Cervix/vagina normal.  Mild increase in vaginal secretions.  Wet prep done.  Wet prep: Yeasts present   Assessment/Plan:  78 y.o. G3P3004   1. Vaginal discharge Yeast vaginitis confirmed by wet prep.  Decision to treat with fluconazole 150 mg/tab. 1 tablet per mouth daily for 3 days.  Refills prescribed.  Can use probiotic tablet vaginally once a week for prevention.  Usage of fluconazole reviewed and prescription sent to pharmacy. - WET PREP FOR TRICH, YEAST, CLUE  2. Vaginal odor As above - WET PREP FOR TRICH, YEAST, CLUE  Other orders - fluconazole (DIFLUCAN) 150 MG tablet; Take 1 tablet (150 mg total) by mouth daily for 3 days.  Counseling on above issues and coordination of care more than 50% for 15 minutes.  Brittany DelMarie-Lyne Bindu Docter MD, 9:46 AM 07/08/2018

## 2018-07-24 ENCOUNTER — Ambulatory Visit: Payer: Medicare Other | Admitting: Obstetrics & Gynecology

## 2018-07-24 ENCOUNTER — Encounter: Payer: Self-pay | Admitting: Obstetrics & Gynecology

## 2018-07-24 VITALS — BP 138/86

## 2018-07-24 DIAGNOSIS — N950 Postmenopausal bleeding: Secondary | ICD-10-CM | POA: Diagnosis not present

## 2018-07-24 DIAGNOSIS — N813 Complete uterovaginal prolapse: Secondary | ICD-10-CM

## 2018-07-24 DIAGNOSIS — N898 Other specified noninflammatory disorders of vagina: Secondary | ICD-10-CM

## 2018-07-24 NOTE — Progress Notes (Signed)
    [name] March 16, 1941 [id number]        78 y.o.  [id number]   RP: Recurrent mild PMB with vaginal discharge   HPI: Patient has been seen multiple times in the past year for recurrent vaginal discharge.  At times the wet prep showed bacterial vaginosis, on the last visit July 08, 2018 yeasts were present.  Patient was treated with fluconazole.  Patient has a complete uterine prolapse for which a pessary was used in the past, but currently prefers observation without surgery or pessary management.  In the past week, patient noticed postmenopausal spotting with very mild tinges of blood on her underwear.  Slight odor with her discharge.  No pelvic pain.  Abstinent.  The last time patient had postmenopausal bleeding was in October 2018, at that time the pelvic ultrasound showed a thin endometrial lining at 3.3 mm.   OB History  Gravida Para Term Preterm AB Living  3 3 3     4   SAB TAB Ectopic Multiple Live Births        1      # Outcome Date GA Lbr Len/2nd Weight Sex Delivery Anes PTL Lv  3 Term           2 Term           1 Term             Past medical history,surgical history, problem list, medications, allergies, family history and social history were all reviewed and documented in the EPIC chart.   Directed ROS with pertinent positives and negatives documented in the history of present illness/assessment and plan.  Exam:  Vitals:   07/24/18 1041  BP: 138/86   General appearance:  Normal  Gynecologic exam: Vulva normal.  Cervix visible at the vulva and laying down position.  No cervical lesion present and no blood visualized.  Speculum exam with normal cervix and vagina.  Minimal vaginal secretions.  No odor.  Decision not to repeat a wet prep today.  Bimanual exam.  Uterus normal volume, nontender, mobile.  No adnexal mass felt and nontender bilaterally.   Assessment/Plan:  78 y.o. G3P3004   1. Vaginal discharge No vaginal discharge seen, no odor no itching.   Decision not to repeat a wet prep today.  Patient reassured.  2. Postmenopausal bleeding Very mild postmenopausal bleeding.  No lesions seen on the cervix or vagina or vulva.  Will complete the investigation with a pelvic ultrasound to assess the endometrium and rule out an endometrial polyp, submucosal myoma, endometrial hyperplasia and endometrial cancer. - US Transvaginal Non-OB; Future  3. Complete uterine prolapse Mildly symptomatic complete uterine prolapse. No erosion or ulceration seen on exam.  Considering using a pessary again.  Declines surgical correction.  Counseling on above issues and coordination of care more than 50% for 15 minutes.  Genia Del MD, 10:57 AM 07/24/2018

## 2018-07-25 ENCOUNTER — Encounter: Payer: Self-pay | Admitting: Obstetrics & Gynecology

## 2018-07-25 NOTE — Patient Instructions (Signed)
1. Vaginal discharge No vaginal discharge seen, no odor no itching.  Decision not to repeat a wet prep today.  Patient reassured.  2. Postmenopausal bleeding Very mild postmenopausal bleeding.  No lesions seen on the cervix or vagina or vulva.  Will complete the investigation with a pelvic ultrasound to assess the endometrium and rule out an endometrial polyp, submucosal myoma, endometrial hyperplasia and endometrial cancer. - US Transvaginal Non-OB; Future  3. Complete uterine prolapse Mildly symptomatic complete uterine prolapse. No erosion or ulceration seen on exam.  Considering using a pessary again.  Declines surgical correction.  [name], it was a pleasure seeing you today!

## 2018-07-31 ENCOUNTER — Ambulatory Visit: Payer: Medicare Other

## 2018-07-31 ENCOUNTER — Ambulatory Visit: Payer: Medicare Other | Admitting: Obstetrics & Gynecology

## 2018-07-31 DIAGNOSIS — N950 Postmenopausal bleeding: Secondary | ICD-10-CM

## 2018-07-31 NOTE — Progress Notes (Signed)
    [name] September 19, 1940 [id number]        78 y.o.  [id number]   RP: Postmenopausal spotting for Pelvic US  HPI: Very mild postmenopausal spotting versus vaginal secretions.  No pelvic pain.  Abstinent.   OB History  Gravida Para Term Preterm AB Living  3 3 3     4   SAB TAB Ectopic Multiple Live Births        1      # Outcome Date GA Lbr Len/2nd Weight Sex Delivery Anes PTL Lv  3 Term           2 Term           1 Term             Past medical history,surgical history, problem list, medications, allergies, family history and social history were all reviewed and documented in the EPIC chart.   Directed ROS with pertinent positives and negatives documented in the history of present illness/assessment and plan.  Exam:  There were no vitals filed for this visit. General appearance:  Normal  Pelvic US today: T/V images.  Retroflexed uterus measuring 6.04 x 3.85 x 2.86 cm.  No myometrial mass.  Thin symmetrical endometrial line at 1.7 mm.  No endometrial mass seen.  Both ovaries are small with atrophic appearance.  No adnexal mass seen.  No free fluid in the posterior cul-de-sac.   Assessment/Plan:  78 y.o. G3P3004   1. Postmenopausal bleeding Pelvic ultrasound findings reviewed with patient.  Patient reassured that the endometrial lining is very thin and normal at 1.7 mm.  Uterus and ovaries normal. - US Transvaginal Non-OB  Counseling on above issues and coordination of care more than 50% for 15 minutes.   Genia Del MD, 12:40 PM 07/31/2018

## 2018-08-01 ENCOUNTER — Encounter: Payer: Self-pay | Admitting: Obstetrics & Gynecology

## 2018-08-01 NOTE — Patient Instructions (Signed)
1. Postmenopausal bleeding Pelvic ultrasound findings reviewed with patient.  Patient reassured that the endometrial lining is very thin and normal at 1.7 mm.  Uterus and ovaries normal. - US Transvaginal Non-OB  [name], it was a pleasure seeing you today!

## 2018-08-04 ENCOUNTER — Encounter: Payer: Medicare Other | Admitting: Obstetrics & Gynecology

## 2018-09-07 ENCOUNTER — Other Ambulatory Visit: Payer: Self-pay | Admitting: Obstetrics & Gynecology

## 2018-09-25 ENCOUNTER — Encounter: Payer: Medicare Other | Admitting: Obstetrics & Gynecology

## 2018-09-28 ENCOUNTER — Other Ambulatory Visit: Payer: Self-pay | Admitting: Obstetrics & Gynecology

## 2018-09-29 NOTE — Telephone Encounter (Signed)
annual exam scheduled on 11/27/18

## 2018-11-27 ENCOUNTER — Encounter: Payer: Self-pay | Admitting: Gynecology

## 2018-11-27 ENCOUNTER — Ambulatory Visit: Payer: Medicare Other | Admitting: Obstetrics & Gynecology

## 2018-11-27 ENCOUNTER — Encounter: Payer: Self-pay | Admitting: Obstetrics & Gynecology

## 2018-11-27 ENCOUNTER — Other Ambulatory Visit: Payer: Self-pay

## 2018-11-27 VITALS — BP 124/84 | Ht 67.0 in | Wt 213.0 lb

## 2018-11-27 DIAGNOSIS — Z780 Asymptomatic menopausal state: Secondary | ICD-10-CM

## 2018-11-27 DIAGNOSIS — Z6833 Body mass index (BMI) 33.0-33.9, adult: Secondary | ICD-10-CM | POA: Diagnosis not present

## 2018-11-27 DIAGNOSIS — N813 Complete uterovaginal prolapse: Secondary | ICD-10-CM | POA: Diagnosis not present

## 2018-11-27 DIAGNOSIS — Z01419 Encounter for gynecological examination (general) (routine) without abnormal findings: Secondary | ICD-10-CM | POA: Diagnosis not present

## 2018-11-27 DIAGNOSIS — M8589 Other specified disorders of bone density and structure, multiple sites: Secondary | ICD-10-CM | POA: Diagnosis not present

## 2018-11-27 DIAGNOSIS — E6609 Other obesity due to excess calories: Secondary | ICD-10-CM | POA: Diagnosis not present

## 2018-11-27 NOTE — Progress Notes (Signed)
[name] 01/04/1941 [id number]   History:    78 y.o. G3P3L4 Married  RP:  Established patient presenting for annual gyn exam   HPI: Postmenopause, well on no HRT.  Very mild brownish spotting vaginally, especially after BMs, unchanged since evaluation in 07/2018.  Pelvic US 07/31/2018 showed an Endometrial line thin and normal at 1.7 mm.  Complete uterine prolapse with pain and pressure in vagina, cervix visible at vulva when sitting or standing.  Breats normal.  BMI 33.36.  Mildly physically active.  Health labs with Fam MD.  Past medical history,surgical history, family history and social history were all reviewed and documented in the EPIC chart.  Gynecologic History No LMP recorded. Patient is postmenopausal. Contraception: post menopausal status Last Pap: 11/2015. Results were: Negative/HPV HR neg Last mammogram: 09/2016. Results were: Negative Bone Density: 03/2016 Osteopenia Spine -2.1 Colonoscopy: Schedule through Fam MD  Obstetric History OB History  Gravida Para Term Preterm AB Living  3 3 3     4   SAB TAB Ectopic Multiple Live Births        1      # Outcome Date GA Lbr Len/2nd Weight Sex Delivery Anes PTL Lv  3 Term           2 Term           1 Term              ROS: A ROS was performed and pertinent positives and negatives are included in the history.  GENERAL: No fevers or chills. HEENT: No change in vision, no earache, sore throat or sinus congestion. NECK: No pain or stiffness. CARDIOVASCULAR: No chest pain or pressure. No palpitations. PULMONARY: No shortness of breath, cough or wheeze. GASTROINTESTINAL: No abdominal pain, nausea, vomiting or diarrhea, melena or bright red blood per rectum. GENITOURINARY: No urinary frequency, urgency, hesitancy or dysuria. MUSCULOSKELETAL: No joint or muscle pain, no back pain, no recent trauma. DERMATOLOGIC: No rash, no itching, no lesions. ENDOCRINE: No polyuria, polydipsia, no heat or cold intolerance. No recent change  in weight. HEMATOLOGICAL: No anemia or easy bruising or bleeding. NEUROLOGIC: No headache, seizures, numbness, tingling or weakness. PSYCHIATRIC: No depression, no loss of interest in normal activity or change in sleep pattern.     Exam:   BP 124/84   Ht 5\' 7"  (1.702 m)   Wt 213 lb (96.6 kg)   BMI 33.36 kg/m   Body mass index is 33.36 kg/m.  General appearance : Well developed well nourished female. No acute distress HEENT: Eyes: no retinal hemorrhage or exudates,  Neck supple, trachea midline, no carotid bruits, no thyroidmegaly Lungs: Clear to auscultation, no rhonchi or wheezes, or rib retractions  Heart: Regular rate and rhythm, no murmurs or gallops Breast:Examined in sitting and supine position were symmetrical in appearance, no palpable masses or tenderness,  no skin retraction, no nipple inversion, no nipple discharge, no skin discoloration, no axillary or supraclavicular lymphadenopathy Abdomen: no palpable masses or tenderness, no rebound or guarding Extremities: no edema or skin discoloration or tenderness  Pelvic: Vulva: Normal             Vagina: No gross lesions or discharge  Cervix: No gross lesions or discharge.  Complete uterine prolapse.  Pessary fitting done.  Well with Milex ring #3 with support.  Uterus  AV, normal size, shape and consistency, non-tender and mobile  Adnexa  Without masses or tenderness  Anus: Normal   Assessment/Plan:  78 y.o. female  for annual exam   1. Well female exam with routine gynecological exam Gynecologic exam in menopause with complete uterine prolapse.  Last Pap test June 2017 was negative with negative high-risk HPV, no indication to repeat the Pap test this year.  Breast exam normal.  Last screening mammogram April 2018 was negative, patient will schedule a screening mammogram now.  Health labs with family physician.  Will schedule a colonoscopy when due through her family physician.  2. Complete uterine prolapse Symptomatic  complete uterine prolapse.  After counseling, decision to manage with a pessary.  Usage, risks and benefits of pessary versus surgery reviewed.  Pessary fitting done.  Milex ring #3 with support fitting well, will order and patient will come back for placement.  3. Postmenopause Well on no hormone replacement therapy.  No frank post menopausal bleeding but continues to have a very mild brownish discharge probably associated with a complete uterine prolapse.  Pelvic ultrasound February 2020 showed a very thin normal endometrial lining at 1.7 mm.  4. Osteopenia of multiple sites Osteopenia on bone density October 2017 with a T score at -2.1.  Will schedule a bone density now.  Vitamin D supplements, calcium intake of 1200 mg daily and regular weightbearing physical activities recommended.  - DG Bone Density; Future  5. Class 1 obesity due to excess calories without serious comorbidity with body mass index (BMI) of 33.0 to 33.9 in adult Lower calorie/carb diet such as [employer] recommended.  Aerobic physical activities 5 times a week and weightlifting every 2 days.  Counseling on above issues and coordination of care more than 50% for 15 minutes.  Princess Bruins MD, 12:29 PM 11/27/2018

## 2018-12-04 ENCOUNTER — Other Ambulatory Visit: Payer: Self-pay

## 2018-12-05 ENCOUNTER — Ambulatory Visit (INDEPENDENT_AMBULATORY_CARE_PROVIDER_SITE_OTHER): Payer: Medicare Other | Admitting: Obstetrics & Gynecology

## 2018-12-05 ENCOUNTER — Encounter: Payer: Self-pay | Admitting: Obstetrics & Gynecology

## 2018-12-05 VITALS — BP 122/78

## 2018-12-05 DIAGNOSIS — N813 Complete uterovaginal prolapse: Secondary | ICD-10-CM | POA: Diagnosis not present

## 2018-12-05 NOTE — Patient Instructions (Signed)
1. Complete uterine prolapse Pessary management decided at last visit.  Pessary fitting done, Milex ring #3 with support ordered at last visit.  Pessary inserted easily today.  Patient is able to remove and reinsert the pessary.  Precautions reviewed with patient.  Follow-up in 4 weeks for pessary maintenance.  Other orders - CALCIUM PO; Take 1 tablet by mouth daily. - Cyanocobalamin (VITAMIN B-12 PO); Take 1 tablet by mouth daily.  Yisel, it was a pleasure seeing you today!

## 2018-12-05 NOTE — Patient Instructions (Signed)
1. Well female exam with routine gynecological exam Gynecologic exam in menopause with complete uterine prolapse.  Last Pap test June 2017 was negative with negative high-risk HPV, no indication to repeat the Pap test this year.  Breast exam normal.  Last screening mammogram April 2018 was negative, patient will schedule a screening mammogram now.  Health labs with family physician.  Will schedule a colonoscopy when due through her family physician.  2. Complete uterine prolapse Symptomatic complete uterine prolapse.  After counseling, decision to manage with a pessary.  Usage, risks and benefits of pessary versus surgery reviewed.  Pessary fitting done.  Milex ring #3 with support fitting well, will order and patient will come back for placement.  3. Postmenopause Well on no hormone replacement therapy.  No frank post menopausal bleeding but continues to have a very mild brownish discharge probably associated with a complete uterine prolapse.  Pelvic ultrasound February 2020 showed a very thin normal endometrial lining at 1.7 mm.  4. Osteopenia of multiple sites Osteopenia on bone density October 2017 with a T score at -2.1.  Will schedule a bone density now.  Vitamin D supplements, calcium intake of 1200 mg daily and regular weightbearing physical activities recommended.  - DG Bone Density; Future  5. Class 1 obesity due to excess calories without serious comorbidity with body mass index (BMI) of 33.0 to 33.9 in adult Lower calorie/carb diet such as [employer] recommended.  Aerobic physical activities 5 times a week and weightlifting every 2 days.  [name], it was a pleasure seeing you today!

## 2018-12-05 NOTE — Progress Notes (Signed)
    [name] [birth year] [id number]        78 y.o.  [id number] Married  RP: Pessary placement for complete uterine prolapse  HPI: Milex ring #3 with support fitted at last visit.  Pessary ordered and received, will place in vagina today.   OB History  Gravida Para Term Preterm AB Living  3 3 3     4   SAB TAB Ectopic Multiple Live Births        1      # Outcome Date GA Lbr Len/2nd Weight Sex Delivery Anes PTL Lv  3 Term           2 Term           1 Term             Past medical history,surgical history, problem list, medications, allergies, family history and social history were all reviewed and documented in the EPIC chart.   Directed ROS with pertinent positives and negatives documented in the history of present illness/assessment and plan.  Exam:  Vitals:   12/05/18 1114  BP: 122/78   General appearance:  Normal   Gynecologic exam: Vulva normal.  Easy placement of Milex ring #3 with support.  Good fit.  Patient able to remove it and put it back in place.   Assessment/Plan:  78 y.o. G3P3004   1. Complete uterine prolapse Pessary management decided at last visit.  Pessary fitting done, Milex ring #3 with support ordered at last visit.  Pessary inserted easily today.  Patient is able to remove and reinsert the pessary.  Precautions reviewed with patient.  Follow-up in 4 weeks for pessary maintenance.  Other orders - CALCIUM PO; Take 1 tablet by mouth daily. - Cyanocobalamin (VITAMIN B-12 PO); Take 1 tablet by mouth daily.  Princess Bruins MD, 11:19 AM 12/05/2018

## 2018-12-24 ENCOUNTER — Other Ambulatory Visit: Payer: Self-pay

## 2018-12-25 ENCOUNTER — Ambulatory Visit (INDEPENDENT_AMBULATORY_CARE_PROVIDER_SITE_OTHER): Payer: Medicare Other

## 2018-12-25 DIAGNOSIS — M8589 Other specified disorders of bone density and structure, multiple sites: Secondary | ICD-10-CM

## 2018-12-25 DIAGNOSIS — N950 Postmenopausal bleeding: Secondary | ICD-10-CM | POA: Diagnosis not present

## 2018-12-26 ENCOUNTER — Other Ambulatory Visit: Payer: Self-pay | Admitting: Obstetrics & Gynecology

## 2018-12-26 DIAGNOSIS — N950 Postmenopausal bleeding: Secondary | ICD-10-CM

## 2018-12-26 DIAGNOSIS — M8589 Other specified disorders of bone density and structure, multiple sites: Secondary | ICD-10-CM

## 2019-01-05 ENCOUNTER — Other Ambulatory Visit: Payer: Self-pay

## 2019-01-06 ENCOUNTER — Ambulatory Visit (INDEPENDENT_AMBULATORY_CARE_PROVIDER_SITE_OTHER): Payer: Medicare Other | Admitting: Obstetrics & Gynecology

## 2019-01-06 ENCOUNTER — Encounter: Payer: Self-pay | Admitting: Gynecology

## 2019-01-06 DIAGNOSIS — Z4689 Encounter for fitting and adjustment of other specified devices: Secondary | ICD-10-CM

## 2019-01-06 DIAGNOSIS — N813 Complete uterovaginal prolapse: Secondary | ICD-10-CM | POA: Diagnosis not present

## 2019-01-06 NOTE — Progress Notes (Signed)
    [name] 1941-05-04 [id number]        78 y.o.  [id number]   RP: Pessary maintenance  HPI: Uterine prolapse/Cystocele doing very well with Milex ring #3 with support.  No vaginal discharge.  No PMB.  Urine/BMs normal.  No fever.  Would like to be able to remove/clean her pessary herselself.   OB History  Gravida Para Term Preterm AB Living  3 3 3     4   SAB TAB Ectopic Multiple Live Births        1      # Outcome Date GA Lbr Len/2nd Weight Sex Delivery Anes PTL Lv  3 Term           2 Term           1 Term             Past medical history,surgical history, problem list, medications, allergies, family history and social history were all reviewed and documented in the EPIC chart.   Directed ROS with pertinent positives and negatives documented in the history of present illness/assessment and plan.  Exam:  There were no vitals filed for this visit. General appearance:  Normal  Abdomen: Normal  Gynecologic exam: Vulva normal.  Pessary removed easily.  Cervix and vaginal mucosa intact.  Normal vaginal secretions.  Pessary put back in place easily.  Patient attempted to remove the pessary herself after instructions, but unable to.   Assessment/Plan:  78 y.o. G3P3004   1. Complete uterine prolapse Well with pessary.  2. Encounter for pessary maintenance Doing well with Milex ring #3 with support, but would like to switch to a pessary with a knob to be able to remove the pessary herself to clean and put back in place.  Was able to remove her pessary in the past when she had one with a knob.  Will order a Milex ring #3 with support with a knob so that patient can remove the pessary herself to clean it.  We will follow-up to switch to pessary when the pessary arrives.  Counseling on above issues and coordination of care more than 50% for 15 minutes.  Princess Bruins MD, 1:15 PM 01/06/2019

## 2019-01-07 ENCOUNTER — Encounter: Payer: Self-pay | Admitting: Obstetrics & Gynecology

## 2019-01-07 NOTE — Patient Instructions (Signed)
1. Complete uterine prolapse Well with pessary.  2. Encounter for pessary maintenance Doing well with Milex ring #3 with support, but would like to switch to a pessary with a knob to be able to remove the pessary herself to clean and put back in place.  Was able to remove her pessary in the past when she had one with a knob.  Will order a Milex ring #3 with support with a knob so that patient can remove the pessary herself to clean it.  We will follow-up to switch to pessary when the pessary arrives.  [name], it was a pleasure seeing you today!

## 2019-01-09 ENCOUNTER — Other Ambulatory Visit: Payer: Self-pay

## 2019-01-12 ENCOUNTER — Ambulatory Visit: Payer: Medicare Other | Admitting: Obstetrics & Gynecology

## 2019-01-12 ENCOUNTER — Encounter: Payer: Self-pay | Admitting: Obstetrics & Gynecology

## 2019-01-12 ENCOUNTER — Other Ambulatory Visit: Payer: Self-pay

## 2019-01-12 VITALS — BP 124/80

## 2019-01-12 DIAGNOSIS — Z4689 Encounter for fitting and adjustment of other specified devices: Secondary | ICD-10-CM | POA: Diagnosis not present

## 2019-01-12 DIAGNOSIS — N813 Complete uterovaginal prolapse: Secondary | ICD-10-CM | POA: Diagnosis not present

## 2019-01-17 ENCOUNTER — Encounter: Payer: Self-pay | Admitting: Obstetrics & Gynecology

## 2019-01-17 NOTE — Patient Instructions (Signed)
1. Complete uterine prolapse Well with pessary management.  2. Fitting and adjustment of pessary New pessary inserted today that will allow patient to remove it to assure self maintenance of the pessary.  Pessary ring with handle in the middle inserted easily, good fit.  [name], it was a pleasure seeing you today!

## 2019-01-17 NOTE — Progress Notes (Signed)
    [name] 10-May-1941 [id number]        78 y.o.  [id number]   RP: Insertion of new pessary  HPI: Patient is not able to remove the Milex ring pessary to clean it on her own.  New pessary ring with handle in the middle to facilitate removal.   OB History  Gravida Para Term Preterm AB Living  3 3 3     4   SAB TAB Ectopic Multiple Live Births        1      # Outcome Date GA Lbr Len/2nd Weight Sex Delivery Anes PTL Lv  3 Term           2 Term           1 Term             Past medical history,surgical history, problem list, medications, allergies, family history and social history were all reviewed and documented in the EPIC chart.   Directed ROS with pertinent positives and negatives documented in the history of present illness/assessment and plan.  Exam:  Vitals:   01/12/19 0916  BP: 124/80   General appearance:  Normal  Abdomen: Normal  Gynecologic exam: Vulva normal.  Easy insertion of new Ring pessary with handle in the middle.  Good fit.   Assessment/Plan:  78 y.o. G3P3004   1. Complete uterine prolapse Well with pessary management.  2. Fitting and adjustment of pessary New pessary inserted today that will allow patient to remove it to assure self maintenance of the pessary.  Pessary ring with handle in the middle inserted easily, good fit.  Counseling on above issues and coordination of care more than 50% for 15 minutes.  Princess Bruins MD, 6:27 AM 01/17/2019

## 2019-02-19 ENCOUNTER — Encounter: Payer: Self-pay | Admitting: Obstetrics & Gynecology

## 2019-02-19 ENCOUNTER — Other Ambulatory Visit: Payer: Self-pay

## 2019-02-19 ENCOUNTER — Ambulatory Visit (INDEPENDENT_AMBULATORY_CARE_PROVIDER_SITE_OTHER): Payer: Medicare Other | Admitting: Obstetrics & Gynecology

## 2019-02-19 VITALS — BP 150/80

## 2019-02-19 DIAGNOSIS — N898 Other specified noninflammatory disorders of vagina: Secondary | ICD-10-CM

## 2019-02-19 DIAGNOSIS — N814 Uterovaginal prolapse, unspecified: Secondary | ICD-10-CM | POA: Diagnosis not present

## 2019-02-19 LAB — WET PREP FOR TRICH, YEAST, CLUE

## 2019-02-19 NOTE — Progress Notes (Signed)
    [name] [birth year] [id number]        78 y.o.  [id number]   RP: Vaginal discharge with itching  HPI: New pessary inserted 01/12/2019 with handle in the middle (Gellhorn) to allow patient to remove it herself, but she has not been able to remove it.  No vaginal bleeding.  No pelvic pain.  No fever.  Urine/BMs normal.   OB History  Gravida Para Term Preterm AB Living  3 3 3     4   SAB TAB Ectopic Multiple Live Births        1      # Outcome Date GA Lbr Len/2nd Weight Sex Delivery Anes PTL Lv  3 Term           2 Term           1 Term             Past medical history,surgical history, problem list, medications, allergies, family history and social history were all reviewed and documented in the EPIC chart.   Directed ROS with pertinent positives and negatives documented in the history of present illness/assessment and plan.  Exam:  Vitals:   02/19/19 1136  BP: (!) 150/80   General appearance:  Normal  Abdomen: Normal  Gynecologic exam: Vulva normal.  Pessary (Gellhorn) very difficult to remove, but removed successfully by myself (patient attempted to remove it in my presence without success).  Vaginal mucosa intact.  Wet prep done.  Wet prep negative   Assessment/Plan:  78 y.o. G3P3004   1. Vaginal discharge Vaginal discharge within normal limits with a negative wet prep.  Patient reassured. - WET PREP FOR TRICH, YEAST, CLUE  2. Vaginal pruritus As above. - WET PREP FOR [place], YEAST, CLUE  3. Cystocele with uterine prolapse Decision to try tampons instead of the pessary.  F/U in 3 weeks if not satisfactory.  Counseling on above issues and coordination of care more than 50% for 79 minutes.  Princess Bruins MD, 11:46 AM 02/19/2019

## 2019-02-20 ENCOUNTER — Encounter: Payer: Self-pay | Admitting: Obstetrics & Gynecology

## 2019-02-20 NOTE — Patient Instructions (Signed)
1. Vaginal discharge Vaginal discharge within normal limits with a negative wet prep.  Patient reassured. - WET PREP FOR TRICH, YEAST, CLUE  2. Vaginal pruritus As above. - WET PREP FOR [place], YEAST, CLUE  3. Cystocele with uterine prolapse Decision to try tampons instead of the pessary.  F/U in 3 weeks if not satisfactory.  [name], it was a pleasure seeing you today!

## 2019-03-03 ENCOUNTER — Other Ambulatory Visit: Payer: Self-pay | Admitting: Obstetrics & Gynecology

## 2019-05-22 ENCOUNTER — Other Ambulatory Visit: Payer: Self-pay

## 2019-05-25 ENCOUNTER — Encounter: Payer: Self-pay | Admitting: Obstetrics & Gynecology

## 2019-05-25 ENCOUNTER — Ambulatory Visit: Payer: Medicare Other | Admitting: Obstetrics & Gynecology

## 2019-05-25 ENCOUNTER — Encounter: Payer: Self-pay | Admitting: Gynecology

## 2019-05-25 ENCOUNTER — Other Ambulatory Visit: Payer: Self-pay

## 2019-05-25 VITALS — BP 136/84

## 2019-05-25 DIAGNOSIS — N898 Other specified noninflammatory disorders of vagina: Secondary | ICD-10-CM

## 2019-05-25 DIAGNOSIS — R319 Hematuria, unspecified: Secondary | ICD-10-CM

## 2019-05-25 DIAGNOSIS — N813 Complete uterovaginal prolapse: Secondary | ICD-10-CM

## 2019-05-25 DIAGNOSIS — Z4689 Encounter for fitting and adjustment of other specified devices: Secondary | ICD-10-CM

## 2019-05-25 DIAGNOSIS — R8290 Unspecified abnormal findings in urine: Secondary | ICD-10-CM | POA: Diagnosis not present

## 2019-05-25 LAB — WET PREP FOR TRICH, YEAST, CLUE

## 2019-05-25 MED ORDER — CIPROFLOXACIN HCL 500 MG PO TABS: 500.0000 mg | ORAL_TABLET | Freq: Two times a day (BID) | ORAL | 0 refills | Status: AC

## 2019-05-25 NOTE — Patient Instructions (Signed)
1.  We will.  Hematuria, unspecified type Urine analysis compatible with an acute cystitis.  Will treat with Macrobid 1 tablet twice a day for 7 days.  Usage reviewed.  No allergy to Macrobid.  Prescription sent to pharmacy. - Urinalysis,Complete w/RFL Culture  2. Abnormal urine odor As above.  3. Vaginal discharge Wet prep negative.  Patient reassured.  4. Complete uterine prolapse Decision to order a Milex ring #2 with support and knob.  Patient will follow up for insertion.  5. Fitting and adjustment of pessary Milex ring #2 with support and knob ordered.  Other orders - ciprofloxacin (CIPRO) 500 MG tablet; Take 1 tablet (500 mg total) by mouth 2 (two) times daily for 7 days.  [name], it was a pleasure seeing you today!  I will inform you of your results as soon as they are available

## 2019-05-25 NOTE — Progress Notes (Signed)
    [name] May 13, 1941 [id number]        78 y.o.  [id number]   RP: Complete uterine prolapse for pessary fitting  HPI: Bulging at the vulva.  Wants to fit a new pessary.  The last one was a Milex ring #3 with knob and support, it was too big per patient and she was not able to remove it to clean it, so she stopped using it.  Odor in urine and mild hematuria.  No pelvic pain.  No fever.    OB History  Gravida Para Term Preterm AB Living  3 3 3     4   SAB TAB Ectopic Multiple Live Births        1      # Outcome Date GA Lbr Len/2nd Weight Sex Delivery Anes PTL Lv  3 Term           2 Term           1 Term             Past medical history,surgical history, problem list, medications, allergies, family history and social history were all reviewed and documented in the EPIC chart.   Directed ROS with pertinent positives and negatives documented in the history of present illness/assessment and plan.  Exam:  Vitals:   05/25/19 0830  BP: 136/84   General appearance:  Normal  Abdomen: Normal  CVAT Negative bilaterally  Gynecologic exam: Vulva normal.  Complete Uterine prolapse with cervix protruding at the vulva.  Speculum:  Cervix/Vagina normal.  Normal secretions, no bleeding.  Wet prep done.    Pessary fitting:  Milex ring #2 with support and knob fitting well.  Wet prep: Negative U/A: Yellow cloudy, protein negative, nitrites negative, white blood cells 20-40, red blood cells 3-10, moderate bacteria.  Urine culture pending.   Assessment/Plan:  78 y.o. G3P3004   1. Hematuria, unspecified type Urine analysis compatible with an acute cystitis.  Will treat with Macrobid 1 tablet twice a day for 7 days.  Usage reviewed.  No allergy to Macrobid.  Prescription sent to pharmacy. - Urinalysis,Complete w/RFL Culture  2. Abnormal urine odor As above.  3. Vaginal discharge Wet prep negative.  Patient reassured.  4. Complete uterine prolapse Decision to order a Milex  ring #2 with support and knob.  Patient will follow up for insertion.  5. Fitting and adjustment of pessary Milex ring #2 with support and knob ordered.  Other orders - ciprofloxacin (CIPRO) 500 MG tablet; Take 1 tablet (500 mg total) by mouth 2 (two) times daily for 7 days.  Counseling on the above issues and coordination of care more than 50% for 25 minutes.  Princess Bruins MD, 9:08 AM 05/25/2019

## 2019-05-26 LAB — URINALYSIS, COMPLETE W/RFL CULTURE
Bilirubin Urine: NEGATIVE
Glucose, UA: NEGATIVE
Hyaline Cast: NONE SEEN /LPF
Ketones, ur: NEGATIVE
Nitrites, Initial: NEGATIVE
Protein, ur: NEGATIVE
Specific Gravity, Urine: 1.02 (ref 1.001–1.03)
pH: 5.5 (ref 5.0–8.0)

## 2019-05-26 LAB — URINE CULTURE
MICRO NUMBER:: 1170265
Result:: NO GROWTH
SPECIMEN QUALITY:: ADEQUATE

## 2019-05-26 LAB — CULTURE INDICATED

## 2019-06-01 ENCOUNTER — Other Ambulatory Visit: Payer: Self-pay

## 2019-06-02 ENCOUNTER — Encounter: Payer: Self-pay | Admitting: Obstetrics & Gynecology

## 2019-06-02 ENCOUNTER — Ambulatory Visit: Payer: Medicare Other | Admitting: Obstetrics & Gynecology

## 2019-06-02 DIAGNOSIS — Z4689 Encounter for fitting and adjustment of other specified devices: Secondary | ICD-10-CM

## 2019-06-02 NOTE — Progress Notes (Signed)
    [name] [birth year] [id number]        78 y.o.  [id number]   RP: Placement of new pessary  HPI: Complete uterine prolapse.  No vaginal bleeding or pain, but urine still has a tiny bit of cloudiness at times.  Finishing Ciprofloxacin for probable acute cystitis, but U. Culture showed No growth.   OB History  Gravida Para Term Preterm AB Living  3 3 3     4   SAB TAB Ectopic Multiple Live Births        1      # Outcome Date GA Lbr Len/2nd Weight Sex Delivery Anes PTL Lv  3 Term           2 Term           1 Term             Past medical history,surgical history, problem list, medications, allergies, family history and social history were all reviewed and documented in the EPIC chart.   Directed ROS with pertinent positives and negatives documented in the history of present illness/assessment and plan.  Exam:  There were no vitals filed for this visit. General appearance:  Normal  Gynecologic exam: Vulva normal, but cervix protruding a little.  Easy Milex ring #2 with support and knob insertion.  Good fit.  Urine culture:  No growth   Assessment/Plan:  78 y.o. G3P3004   1. Encounter for pessary maintenance Insertion of new pessary without difficulty, good fit.  Patient will remove and clean it herself every 3 months or at her convenience.    F/U for Annual/Gyn visit.  Princess Bruins MD, 12:29 PM 06/02/2019

## 2019-06-02 NOTE — Patient Instructions (Addendum)
1. Encounter for pessary maintenance Insertion of new pessary without difficulty, good fit.  Patient will remove and clean it herself every 3 months or at her convenience.    F/U for Annual/Gyn visit.  [name], it was a pleasure seeing you today!

## 2019-09-04 ENCOUNTER — Other Ambulatory Visit: Payer: Self-pay

## 2019-09-07 ENCOUNTER — Ambulatory Visit: Payer: Medicare PPO | Admitting: Obstetrics & Gynecology

## 2019-09-07 ENCOUNTER — Other Ambulatory Visit: Payer: Self-pay

## 2019-09-07 ENCOUNTER — Encounter: Payer: Self-pay | Admitting: Obstetrics & Gynecology

## 2019-09-07 VITALS — BP 124/76

## 2019-09-07 DIAGNOSIS — Z4689 Encounter for fitting and adjustment of other specified devices: Secondary | ICD-10-CM | POA: Diagnosis not present

## 2019-09-07 DIAGNOSIS — N813 Complete uterovaginal prolapse: Secondary | ICD-10-CM | POA: Diagnosis not present

## 2019-09-07 DIAGNOSIS — R319 Hematuria, unspecified: Secondary | ICD-10-CM

## 2019-09-07 MED ORDER — SULFAMETHOXAZOLE-TRIMETHOPRIM 800-160 MG PO TABS: 1.0000 | ORAL_TABLET | Freq: Two times a day (BID) | ORAL | 0 refills | Status: AC

## 2019-09-07 NOTE — Patient Instructions (Signed)
1. Hematuria, unspecified type Abnormal urine analysis compatible with acute cystitis versus contamination.  Pending urine culture.  Decision to treat with Bactrim DS 1 tablet twice a day for 3 days.  Prescription sent to pharmacy. - Urinalysis,Complete w/RFL Culture  2. Complete uterine prolapse Lost her pessary and has complete uterine prolapse with the cervix visible and slightly protruding of the vulva.  No ulceration or active bleeding.  Would like to have a Gellhorn #2 pessary as it is easier for her to remove it and put it back in place by her self.  Pessary ordered and patient will come back to pick it up when arrives.  3. Encounter for pessary maintenance As above.  Other orders - sulfamethoxazole-trimethoprim (BACTRIM DS) 800-160 MG tablet; Take 1 tablet by mouth 2 (two) times daily for 3 days.  Damisha, it was a pleasure seeing you today!

## 2019-09-07 NOTE — Progress Notes (Signed)
    [name] 1940/08/07 [id number]        79 y.o.  K3T4656   RP: Lost her Pessary/Cervix protruding with mild spotting  HPI: Lost her Pessary, thinks that she flushed it in the toilet by mistake, rapped in toilet paper.  Cervix is protruding at the vulva with mild spotting.  No pelvic or vaginal pain.  Mild blood in urine. No fever.   OB History  Gravida Para Term Preterm AB Living  3 3 3     4   SAB TAB Ectopic Multiple Live Births        1      # Outcome Date GA Lbr Len/2nd Weight Sex Delivery Anes PTL Lv  3 Term           2 Term           1 Term             Past medical history,surgical history, problem list, medications, allergies, family history and social history were all reviewed and documented in the EPIC chart.   Directed ROS with pertinent positives and negatives documented in the history of present illness/assessment and plan.  Exam:  Vitals:   09/07/19 1241  BP: 124/76   General appearance:  Normal  Abdomen: Normal  Gynecologic exam: Vulva with cervix visible, just protruding.  No ulceration or lesion seen.  No abnormal discharge.  No bleeding.  U/A: Red cloudy, protein 1+, nitrites negative, white blood cells 20-40, red blood cells packed, few bacteria.  Urine culture pending.   Assessment/Plan:  79 y.o. G3P3004   1. Hematuria, unspecified type Abnormal urine analysis compatible with acute cystitis versus contamination.  Pending urine culture.  Decision to treat with Bactrim DS 1 tablet twice a day for 3 days.  Prescription sent to pharmacy. - Urinalysis,Complete w/RFL Culture  2. Complete uterine prolapse Lost her pessary and has complete uterine prolapse with the cervix visible and slightly protruding of the vulva.  No ulceration or active bleeding.  Would like to have a Gellhorn #2 pessary as it is easier for her to remove it and put it back in place by her self.  Pessary ordered and patient will come back to pick it up when arrives.  3.  Encounter for pessary maintenance As above.  Other orders - sulfamethoxazole-trimethoprim (BACTRIM DS) 800-160 MG tablet; Take 1 tablet by mouth 2 (two) times daily for 3 days.  80 MD, 12:52 PM 09/07/2019

## 2019-09-09 LAB — URINE CULTURE
MICRO NUMBER:: 10276712
Result:: NO GROWTH
SPECIMEN QUALITY:: ADEQUATE

## 2019-09-09 LAB — URINALYSIS, COMPLETE W/RFL CULTURE
Bilirubin Urine: NEGATIVE
Glucose, UA: NEGATIVE
Hyaline Cast: NONE SEEN /LPF
Ketones, ur: NEGATIVE
Nitrites, Initial: NEGATIVE
Specific Gravity, Urine: 1.02 (ref 1.001–1.03)
pH: 7 (ref 5.0–8.0)

## 2019-09-09 LAB — CULTURE INDICATED

## 2019-09-24 ENCOUNTER — Encounter (INDEPENDENT_AMBULATORY_CARE_PROVIDER_SITE_OTHER): Payer: Self-pay | Admitting: Ophthalmology

## 2019-09-24 ENCOUNTER — Other Ambulatory Visit: Payer: Self-pay

## 2019-09-24 ENCOUNTER — Ambulatory Visit (INDEPENDENT_AMBULATORY_CARE_PROVIDER_SITE_OTHER): Payer: Medicare PPO | Admitting: Ophthalmology

## 2019-09-24 DIAGNOSIS — H3561 Retinal hemorrhage, right eye: Secondary | ICD-10-CM | POA: Diagnosis not present

## 2019-09-24 DIAGNOSIS — H35059 Retinal neovascularization, unspecified, unspecified eye: Secondary | ICD-10-CM | POA: Insufficient documentation

## 2019-09-24 DIAGNOSIS — H359 Unspecified retinal disorder: Secondary | ICD-10-CM

## 2019-09-24 DIAGNOSIS — H353211 Exudative age-related macular degeneration, right eye, with active choroidal neovascularization: Secondary | ICD-10-CM

## 2019-09-24 DIAGNOSIS — H35051 Retinal neovascularization, unspecified, right eye: Secondary | ICD-10-CM | POA: Diagnosis not present

## 2019-09-24 DIAGNOSIS — H35351 Cystoid macular degeneration, right eye: Secondary | ICD-10-CM | POA: Insufficient documentation

## 2019-09-24 DIAGNOSIS — H353212 Exudative age-related macular degeneration, right eye, with inactive choroidal neovascularization: Secondary | ICD-10-CM | POA: Insufficient documentation

## 2019-09-24 HISTORY — DX: Cystoid macular degeneration, right eye: H35.351

## 2019-09-24 HISTORY — DX: Retinal neovascularization, unspecified, right eye: H35.051

## 2019-09-24 MED ORDER — AFLIBERCEPT 2MG/0.05ML IZ SOLN FOR KALEIDOSCOPE
2.0000 mg | INTRAVITREAL | Status: AC | PRN
  Administered 2019-09-24: 12:00:00 2 mg via INTRAVITREAL

## 2019-09-24 NOTE — Progress Notes (Signed)
09/24/2019     CHIEF COMPLAINT Patient presents for Macular Degeneration and Retina Follow Up   HISTORY OF PRESENT ILLNESS: [name] is a 79 y.o. female who presents to the clinic today for:   HPI    Retina Follow Up    Patient presents with  Wet AMD.  In right eye.  Duration of 5 weeks.  Since onset it is stable.          Comments    5 week follow up - OCT OU, Possible Eylea OD Patient denies change in vision and overall has no complaints.        Last edited by Edmon Crape, MD on 09/24/2019 11:41 AM. (History)      Referring physician: Kirby Funk, MD [address] [place],  [place] [address]  HISTORICAL INFORMATION:   Selected notes from the MEDICAL RECORD NUMBER       CURRENT MEDICATIONS: No current outpatient medications on file. (Ophthalmic Drugs)   No current facility-administered medications for this visit. (Ophthalmic Drugs)   Current Outpatient Medications (Other)  Medication Sig  . CALCIUM PO Take 1 tablet by mouth daily.  . Cyanocobalamin (VITAMIN B-12 PO) Take 1 tablet by mouth daily.  Marland Kitchen FLUoxetine (PROZAC) 40 MG capsule Take 40 mg by mouth every morning.   . valACYclovir (VALTREX) 500 MG tablet TAKE 1 TABLET BY MOUTH EVERY DAY   No current facility-administered medications for this visit. (Other)      REVIEW OF SYSTEMS:    ALLERGIES No Known Allergies  PAST MEDICAL HISTORY Past Medical History:  Diagnosis Date  . Anxiety   . Arthritis   . Atrophic vaginitis   . Detrusor instability   . GERD (gastroesophageal reflux disease)   . Osteoporosis   . Psoriasis   . Sciatic pain   . Uterine prolapse    Past Surgical History:  Procedure Laterality Date  . APPENDECTOMY    . FOOT SURGERY    . KNEE SURGERY    . TOTAL KNEE ARTHROPLASTY Bilateral    X2-- 2009; 2010  . TOTAL KNEE REVISION Left 01/23/2013   Procedure:  TOTAL KNEE ARTHROPLASTY REVISION ;  Surgeon: Loanne Drilling, MD;  Location: [facility];  Service:  Orthopedics;  Laterality: Left;    FAMILY HISTORY Family History  Problem Relation Age of Onset  . Cancer Mother        GALLBLADDER   . Breast cancer Cousin        Mat. 1st cousin Age 81    SOCIAL HISTORY Social History   Tobacco Use  . Smoking status: Former Smoker    Packs/day: 0.20    Types: Cigarettes    Quit date: 07/25/2015    Years since quitting: 4.1  . Smokeless tobacco: Never Used  Substance Use Topics  . Alcohol use: No    Alcohol/week: 0.0 standard drinks  . Drug use: No         OPHTHALMIC EXAM:  Base Eye Exam    Visual Acuity (Snellen - Linear)      Right Left   Dist cc CF at 5' 20/25 -2   Dist ph cc NI        Tonometry (Tonopen, 10:43 AM)      Right Left   Pressure 13 14       Pupils      Pupils Dark Light Shape React APD   Right PERRL 2 2 Round Minimal None   Left PERRL  2 2 Round Minimal None       Visual Fields (Counting fingers)      Left Right    Full Full       Extraocular Movement      Right Left    Full Full       Neuro/Psych    Oriented x3: Yes   Mood/Affect: Normal       Dilation    Right eye: 1.0% Mydriacyl, 2.5% Phenylephrine @ 10:48 AM        Slit Lamp and Fundus Exam    External Exam      Right Left   External Normal Normal       Slit Lamp Exam      Right Left   Lids/Lashes Normal Normal   Conjunctiva/Sclera White and quiet White and quiet   Cornea Clear Clear   Anterior Chamber Deep and quiet Deep and quiet   Iris Round and reactive Round and reactive   Lens Posterior chamber intraocular lens    Vitreous Posterior vitreous detachment Normal       Fundus Exam      Right Left   Disc Normal    C/D Ratio 0.25    Macula Atrophy, Age related macular degeneration, Advanced age related macular degeneration, Exudates, Macular thickening,, with subretinal fluid much less today the exudates appear to be pooling inferior to the macula    Vessels Normal    Periphery Normal           IMAGING AND PROCEDURES   Imaging and Procedures for 09/24/19  Intravitreal Injection, Pharmacologic Agent - OD - Right Eye       Time Out 09/24/2019. 11:44 AM. Confirmed correct patient, procedure, site, and patient consented.   Anesthesia Topical anesthesia was used.   Procedure Preparation included Tobramycin 0.3%.   Injection:  2 mg aflibercept Gretta Cool) SOLN   NDC: L6038910, Lot: [id number]   Route: Intravitreal, Site: Right Eye, Waste: 0 mg  Post-op Post injection exam found visual acuity of at least counting fingers. The patient tolerated the procedure well. There were no complications. The patient received written and verbal post procedure care education. Post injection medications were not given.                 ASSESSMENT/PLAN:  @PROBAPNOTE @    ICD-10-CM   1. Exudative age-related macular degeneration of right eye with active choroidal neovascularization (HCC)  H35.3211 Intravitreal Injection, Pharmacologic Agent - OD - Right Eye    aflibercept (EYLEA) SOLN 2 mg  2. Choroidal neovascularization of right eye  H35.051   3. Retinal hemorrhage of right eye  H35.61   4. Retinal exudates and deposits  H35.9   5. Cystoid macular edema of right eye  H35.351     1.  2.  3.  Ophthalmic Meds Ordered this visit:  Meds ordered this encounter  Medications  . aflibercept (EYLEA) SOLN 2 mg       No follow-ups on file.  There are no Patient Instructions on file for this visit.   Explained the diagnoses, plan, and follow up with the patient and they expressed understanding.  Patient expressed understanding of the importance of proper follow up care.   Fount Bahe M.D. Diseases & Surgery of the Retina and Vitreous [facility] 09/24/19     Abbreviations: M myopia (nearsighted); A astigmatism; H hyperopia (farsighted); P presbyopia; Mrx spectacle prescription;  CTL contact lenses; OD right eye; OS left eye; OU both  eyes  XT exotropia; ET esotropia; PEK  punctate epithelial keratitis; PEE punctate epithelial erosions; DES dry eye syndrome; MGD meibomian gland dysfunction; ATs artificial tears; PFAT's preservative free artificial tears; [place] nuclear sclerotic cataract; PSC posterior subcapsular cataract; ERM epi-retinal membrane; PVD posterior vitreous detachment; RD retinal detachment; DM diabetes mellitus; DR diabetic retinopathy; NPDR non-proliferative diabetic retinopathy; PDR proliferative diabetic retinopathy; CSME clinically significant macular edema; DME diabetic macular edema; dbh dot blot hemorrhages; CWS cotton wool spot; POAG primary open angle glaucoma; C/D cup-to-disc ratio; HVF humphrey visual field; GVF goldmann visual field; OCT optical coherence tomography; IOP intraocular pressure; BRVO Branch retinal vein occlusion; CRVO central retinal vein occlusion; CRAO central retinal artery occlusion; BRAO branch retinal artery occlusion; RT retinal tear; SB scleral buckle; PPV pars plana vitrectomy; VH Vitreous hemorrhage; PRP panretinal laser photocoagulation; IVK intravitreal kenalog; VMT vitreomacular traction; MH Macular hole;  NVD neovascularization of the disc; NVE neovascularization elsewhere; AREDS age related eye disease study; ARMD age related macular degeneration; POAG primary open angle glaucoma; EBMD epithelial/anterior basement membrane dystrophy; ACIOL anterior chamber intraocular lens; IOL intraocular lens; PCIOL posterior chamber intraocular lens; Phaco/IOL phacoemulsification with intraocular lens placement; [place] photorefractive keratectomy; LASIK laser assisted in situ keratomileusis; HTN hypertension; DM diabetes mellitus; COPD chronic obstructive pulmonary disease

## 2019-10-29 ENCOUNTER — Encounter (INDEPENDENT_AMBULATORY_CARE_PROVIDER_SITE_OTHER): Payer: Medicare PPO | Admitting: Ophthalmology

## 2019-11-03 ENCOUNTER — Ambulatory Visit (INDEPENDENT_AMBULATORY_CARE_PROVIDER_SITE_OTHER): Payer: Medicare PPO | Admitting: Ophthalmology

## 2019-11-03 ENCOUNTER — Other Ambulatory Visit: Payer: Self-pay

## 2019-11-03 ENCOUNTER — Encounter (INDEPENDENT_AMBULATORY_CARE_PROVIDER_SITE_OTHER): Payer: Self-pay | Admitting: Ophthalmology

## 2019-11-03 DIAGNOSIS — H353211 Exudative age-related macular degeneration, right eye, with active choroidal neovascularization: Secondary | ICD-10-CM

## 2019-11-03 DIAGNOSIS — H318 Other specified disorders of choroid: Secondary | ICD-10-CM

## 2019-11-03 MED ORDER — AFLIBERCEPT 2MG/0.05ML IZ SOLN FOR KALEIDOSCOPE
2.0000 mg | INTRAVITREAL | Status: AC | PRN
  Administered 2019-11-03: 2 mg via INTRAVITREAL

## 2019-11-03 NOTE — Progress Notes (Signed)
11/03/2019     CHIEF COMPLAINT Patient presents for Retina Follow Up   HISTORY OF PRESENT ILLNESS: [name] is a 79 y.o. female who presents to the clinic today for:   HPI    Retina Follow Up    Patient presents with  Wet AMD.  In right eye.  Duration of 5 weeks.  Since onset it is stable.          Comments    5 week follow up- OCT OU, Possible Eylea OD Patient denies change in vision and overall has no complaints.        Last edited by Berenice Bouton on 11/03/2019 10:30 AM. (History)      Referring physician: Kirby Funk, MD [address] [place],  [place] [address]  HISTORICAL INFORMATION:   Selected notes from the MEDICAL RECORD NUMBER       CURRENT MEDICATIONS: No current outpatient medications on file. (Ophthalmic Drugs)   No current facility-administered medications for this visit. (Ophthalmic Drugs)   Current Outpatient Medications (Other)  Medication Sig  . CALCIUM PO Take 1 tablet by mouth daily.  . Cyanocobalamin (VITAMIN B-12 PO) Take 1 tablet by mouth daily.  Marland Kitchen FLUoxetine (PROZAC) 40 MG capsule Take 40 mg by mouth every morning.   . valACYclovir (VALTREX) 500 MG tablet TAKE 1 TABLET BY MOUTH EVERY DAY   No current facility-administered medications for this visit. (Other)      REVIEW OF SYSTEMS:    ALLERGIES No Known Allergies  PAST MEDICAL HISTORY Past Medical History:  Diagnosis Date  . Anxiety   . Arthritis   . Atrophic vaginitis   . Detrusor instability   . GERD (gastroesophageal reflux disease)   . Osteoporosis   . Psoriasis   . Sciatic pain   . Uterine prolapse    Past Surgical History:  Procedure Laterality Date  . APPENDECTOMY    . FOOT SURGERY    . KNEE SURGERY    . TOTAL KNEE ARTHROPLASTY Bilateral    X2-- 2009; 2010  . TOTAL KNEE REVISION Left 01/23/2013   Procedure:  TOTAL KNEE ARTHROPLASTY REVISION ;  Surgeon: Loanne Drilling, MD;  Location: [facility];  Service: Orthopedics;  Laterality: Left;      FAMILY HISTORY Family History  Problem Relation Age of Onset  . Cancer Mother        GALLBLADDER   . Breast cancer Cousin        Mat. 1st cousin Age 66    SOCIAL HISTORY Social History   Tobacco Use  . Smoking status: Former Smoker    Packs/day: 0.20    Types: Cigarettes    Quit date: 07/25/2015    Years since quitting: 4.2  . Smokeless tobacco: Never Used  Substance Use Topics  . Alcohol use: No    Alcohol/week: 0.0 standard drinks  . Drug use: No         OPHTHALMIC EXAM:  Base Eye Exam    Visual Acuity (Snellen - Linear)      Right Left   Dist cc CF @ 7' 20/25-2   Dist ph cc NI    Correction: Glasses       Tonometry (Tonopen, 10:38 AM)      Right Left   Pressure 14 16       Pupils      Pupils Dark Light Shape React APD   Right PERRL 4 3 Round Slow None   Left PERRL 4 3  Round Slow None       Visual Fields (Counting fingers)      Left Right    Full Full       Extraocular Movement      Right Left    Full Full       Neuro/Psych    Oriented x3: Yes   Mood/Affect: Normal       Dilation    Right eye: 1.0% Mydriacyl, 2.5% Phenylephrine @ 10:38 AM        Slit Lamp and Fundus Exam    External Exam      Right Left   External Normal Normal       Slit Lamp Exam      Right Left   Lids/Lashes Normal Normal   Conjunctiva/Sclera White and quiet White and quiet   Cornea Clear Clear   Anterior Chamber Deep and quiet Deep and quiet   Iris Round and reactive Round and reactive   Lens Posterior chamber intraocular lens    Anterior Vitreous Normal Normal       Fundus Exam      Right Left   Posterior Vitreous Posterior vitreous detachment    Disc Normal    C/D Ratio 0.35    Macula Exudates, Macular thickening, Hemorrhage, Subretinal neovascular membrane    Vessels Normal    Periphery Normal           IMAGING AND PROCEDURES  Imaging and Procedures for 11/03/19  OCT, Retina - OU - Both Eyes       Right Eye Quality was good. Scan  locations included subfoveal. Central Foveal Thickness: 174. Progression has improved. Findings include abnormal foveal contour, subretinal hyper-reflective material, subretinal fluid.   Left Eye Quality was good. Scan locations included subfoveal. Central Foveal Thickness: 258. Progression has been stable. Findings include normal observations.   Notes Para papillary large CNVM looks like polypoidal with massive subretinal fluid, intraretinal fluid and CME.  Now over the last 6 months this anatomy has improved on therapy with intravitreal Eylea.  Foveal atrophy exists with residual subretinal fluid and subretinal hyper reflective material.  Repeat Eylea OD today at 6-week interval.  Will repeat intravitreal Eylea OD today and examination in 7 weeks       Intravitreal Injection, Pharmacologic Agent - OD - Right Eye       Time Out 11/03/2019. 11:13 AM. Confirmed correct patient, procedure, site, and patient consented.   Anesthesia Topical anesthesia was used. Anesthetic medications included Akten 3.5%.   Procedure Preparation included Tobramycin 0.3%, Ofloxacin , 10% betadine to eyelids. A 30 gauge needle was used.   Injection:  2 mg aflibercept Gretta Cool) SOLN   NDC: [id number], Lot: [id number]   Route: Intravitreal, Site: Right Eye, Waste: 0 mg  Post-op Post injection exam found visual acuity of at least counting fingers. The patient tolerated the procedure well. There were no complications. The patient received written and verbal post procedure care education. Post injection medications were not given.                 ASSESSMENT/PLAN:  Exudative age-related macular degeneration of right eye with active choroidal neovascularization (HCC) The nature of age realated macular degeneration (ARMD)is explained as follows: The dry form refers to the progressive loss of normal blood supply to the central vision as a result of a combination of factors which include aging blood supply  (arteriosclerosis, hardening of the arteries), genetics, smoking habits, and history of hypertension. Currently, no eye medications or vitamins  slow this type of aging effect upon vision, however cessation of smoking and controlling hypertension help slow the disorder. The following analogy helps explain this: I describe the dry form of ARMD like a house of the same age as your eyes, which shows typical wear and tear of age upon the house structure and appearance. Like the aging house which can fall down structurally, the dry form of ARMD can deteriorate the structure of the macula (center) of the retina, most often gradually and affect central vision tasks such as reading and driving. The wet form of ARMD refers to the development of abnormally growing blood vessels usually near or under the central vision, with potential risk of permanent visual changes or vision losses. This complication of the Dry form of ARMD may be moderately reduced by use of AREDS 2 formula multivitamins daily. I describe the Wet form of ARMD as like the development of a fire in an aging house (DRY ARMD). It may develop suddenly, progress rapidly and be destructive based on where it starts and how big the fire is when found. In the eye, the house fire analogy refers to the abnormal blood vessels growing destructively near the central vison in the retina, or film of the eye. Halting the growth of blood vessels with laser (hot or cold) or injectable medications is best way "to put the fire out". Many patients will experience a stabilization or even improvement in vision with a treatment course, while others may still face a loss of vision. The use of injectable medications has revolutionized therapy and is currently the only proven therapy to provide the chance of stable or improved acuity from new and recent destructive wet ARMD.  OD overall improved still active      ICD-10-CM   1. Exudative age-related macular degeneration of right eye  with active choroidal neovascularization (HCC)  H35.3211 OCT, Retina - OU - Both Eyes    Intravitreal Injection, Pharmacologic Agent - OD - Right Eye    aflibercept (EYLEA) SOLN 2 mg  2. Idiopathic polypoidal choroidal vasculopathy  H31.8     1.  2.  3.  Ophthalmic Meds Ordered this visit:  Meds ordered this encounter  Medications  . aflibercept (EYLEA) SOLN 2 mg       Return in about 7 weeks (around 12/22/2019) for EYLEA OCT, OD.  There are no Patient Instructions on file for this visit.   Explained the diagnoses, plan, and follow up with the patient and they expressed understanding.  Patient expressed understanding of the importance of proper follow up care.   Clent Demark Jazyah Butsch M.D. Diseases & Surgery of the Retina and Vitreous [facility] 11/03/19     Abbreviations: M myopia (nearsighted); A astigmatism; H hyperopia (farsighted); P presbyopia; Mrx spectacle prescription;  CTL contact lenses; OD right eye; OS left eye; OU both eyes  XT exotropia; ET esotropia; PEK punctate epithelial keratitis; PEE punctate epithelial erosions; DES dry eye syndrome; MGD meibomian gland dysfunction; ATs artificial tears; PFAT's preservative free artificial tears; [facility] nuclear sclerotic cataract; PSC posterior subcapsular cataract; ERM epi-retinal membrane; PVD posterior vitreous detachment; RD retinal detachment; DM diabetes mellitus; DR diabetic retinopathy; NPDR non-proliferative diabetic retinopathy; PDR proliferative diabetic retinopathy; CSME clinically significant macular edema; DME diabetic macular edema; dbh dot blot hemorrhages; CWS cotton wool spot; POAG primary open angle glaucoma; C/D cup-to-disc ratio; HVF humphrey visual field; GVF goldmann visual field; OCT optical coherence tomography; IOP intraocular pressure; BRVO Branch retinal vein occlusion; CRVO central  retinal vein occlusion; CRAO central retinal artery occlusion; BRAO branch retinal artery occlusion; RT retinal  tear; SB scleral buckle; PPV pars plana vitrectomy; VH Vitreous hemorrhage; PRP panretinal laser photocoagulation; IVK intravitreal kenalog; VMT vitreomacular traction; MH Macular hole;  NVD neovascularization of the disc; NVE neovascularization elsewhere; AREDS age related eye disease study; ARMD age related macular degeneration; POAG primary open angle glaucoma; EBMD epithelial/anterior basement membrane dystrophy; ACIOL anterior chamber intraocular lens; IOL intraocular lens; PCIOL posterior chamber intraocular lens; Phaco/IOL phacoemulsification with intraocular lens placement; PRK photorefractive keratectomy; LASIK laser assisted in situ keratomileusis; HTN hypertension; DM diabetes mellitus; COPD chronic obstructive pulmonary disease

## 2019-11-03 NOTE — Assessment & Plan Note (Signed)
The nature of age realated macular degeneration (ARMD)is explained as follows: The dry form refers to the progressive loss of normal blood supply to the central vision as a result of a combination of factors which include aging blood supply (arteriosclerosis, hardening of the arteries), genetics, smoking habits, and history of hypertension. Currently, no eye medications or vitamins slow this type of aging effect upon vision, however cessation of smoking and controlling hypertension help slow the disorder. The following analogy helps explain this: I describe the dry form of ARMD like a house of the same age as your eyes, which shows typical wear and tear of age upon the house structure and appearance. Like the aging house which can fall down structurally, the dry form of ARMD can deteriorate the structure of the macula (center) of the retina, most often gradually and affect central vision tasks such as reading and driving. The wet form of ARMD refers to the development of abnormally growing blood vessels usually near or under the central vision, with potential risk of permanent visual changes or vision losses. This complication of the Dry form of ARMD may be moderately reduced by use of AREDS 2 formula multivitamins daily. I describe the Wet form of ARMD as like the development of a fire in an aging house (DRY ARMD). It may develop suddenly, progress rapidly and be destructive based on where it starts and how big the fire is when found. In the eye, the house fire analogy refers to the abnormal blood vessels growing destructively near the central vison in the retina, or film of the eye. Halting the growth of blood vessels with laser (hot or cold) or injectable medications is best way "to put the fire out". Many patients will experience a stabilization or even improvement in vision with a treatment course, while others may still face a loss of vision. The use of injectable medications has revolutionized therapy and is  currently the only proven therapy to provide the chance of stable or improved acuity from new and recent destructive wet ARMD.  OD overall improved still active

## 2019-11-19 ENCOUNTER — Ambulatory Visit: Payer: Medicare PPO | Admitting: Nurse Practitioner

## 2019-11-23 ENCOUNTER — Ambulatory Visit: Payer: Medicare PPO | Admitting: Nurse Practitioner

## 2019-11-30 ENCOUNTER — Other Ambulatory Visit: Payer: Self-pay

## 2019-12-01 ENCOUNTER — Encounter: Payer: Self-pay | Admitting: Obstetrics & Gynecology

## 2019-12-01 ENCOUNTER — Encounter: Payer: Self-pay | Admitting: Gynecology

## 2019-12-01 ENCOUNTER — Ambulatory Visit: Payer: Medicare PPO | Admitting: Obstetrics & Gynecology

## 2019-12-01 VITALS — BP 130/70 | Ht 66.5 in | Wt 216.0 lb

## 2019-12-01 DIAGNOSIS — N813 Complete uterovaginal prolapse: Secondary | ICD-10-CM

## 2019-12-01 DIAGNOSIS — Z4689 Encounter for fitting and adjustment of other specified devices: Secondary | ICD-10-CM

## 2019-12-01 DIAGNOSIS — Z01419 Encounter for gynecological examination (general) (routine) without abnormal findings: Secondary | ICD-10-CM

## 2019-12-01 DIAGNOSIS — Z780 Asymptomatic menopausal state: Secondary | ICD-10-CM

## 2019-12-01 NOTE — Progress Notes (Signed)
[name] Feb 13, 1941 [id number]   History:    79 y.o. G3P3L4 Married  RP:  Established patient presenting for annual gyn exam   HPI: Postmenopause, well on no HRT.  No PMB.  No pelvic pain.  Well with Gelhorn 2 1/2 pessary x 08/2019 for complete uterine prolapse, but unable to remove the pessary herself, would like to try one size down.  Breasts normal.  BMI 34.34.  Mildly physically active.  Health labs with Fam MD.   Past medical history,surgical history, family history and social history were all reviewed and documented in the EPIC chart.  Gynecologic History No LMP recorded. Patient is postmenopausal.  Obstetric History OB History  Gravida Para Term Preterm AB Living  3 3 3     4   SAB TAB Ectopic Multiple Live Births        1      # Outcome Date GA Lbr Len/2nd Weight Sex Delivery Anes PTL Lv  3 Term           2 Term           1 Term              ROS: A ROS was performed and pertinent positives and negatives are included in the history.  GENERAL: No fevers or chills. HEENT: No change in vision, no earache, sore throat or sinus congestion. NECK: No pain or stiffness. CARDIOVASCULAR: No chest pain or pressure. No palpitations. PULMONARY: No shortness of breath, cough or wheeze. GASTROINTESTINAL: No abdominal pain, nausea, vomiting or diarrhea, melena or bright red blood per rectum. GENITOURINARY: No urinary frequency, urgency, hesitancy or dysuria. MUSCULOSKELETAL: No joint or muscle pain, no back pain, no recent trauma. DERMATOLOGIC: No rash, no itching, no lesions. ENDOCRINE: No polyuria, polydipsia, no heat or cold intolerance. No recent change in weight. HEMATOLOGICAL: No anemia or easy bruising or bleeding. NEUROLOGIC: No headache, seizures, numbness, tingling or weakness. PSYCHIATRIC: No depression, no loss of interest in normal activity or change in sleep pattern.     Exam:   BP 130/70   Ht 5' 6.5" (1.689 m)   Wt 216 lb (98 kg)   BMI 34.34 kg/m   Body  mass index is 34.34 kg/m.  General appearance : Well developed well nourished female. No acute distress HEENT: Eyes: no retinal hemorrhage or exudates,  Neck supple, trachea midline, no carotid bruits, no thyroidmegaly Lungs: Clear to auscultation, no rhonchi or wheezes, or rib retractions  Heart: Regular rate and rhythm, no murmurs or gallops Breast:Examined in sitting and supine position were symmetrical in appearance, no palpable masses or tenderness,  no skin retraction, no nipple inversion, no nipple discharge, no skin discoloration, no axillary or supraclavicular lymphadenopathy Abdomen: no palpable masses or tenderness, no rebound or guarding Extremities: no edema or skin discoloration or tenderness  Pelvic: Vulva: Normal             Vagina: No gross lesions or discharge.  Pessary removed.  Vaginal mucosa intact.  Cervix: No gross lesions or discharge  Uterus  AV, normal size, shape and consistency, non-tender and mobile  Adnexa  Without masses or tenderness  Anus: Normal   Assessment/Plan:  79 y.o. female for annual exam   1. Well female exam with routine gynecological exam Normal gynecologic exam in menopause except for uterine prolapse.  No indication to repeat a Pap test this year.  Breast exam normal.  Will schedule a screening mammogram now.  Colonoscopy up-to-date.  Health labs  with family physician.  2. Postmenopause Well on no hormone replacement therapy.  No postmenopausal bleeding.  Last bone density July 2020 was normal, will repeat at 5 years.  3. Complete uterine prolapse Well managed on Gelhorn pessary.  4. Encounter for pessary maintenance Will order a Gelhorn pessary 2 1/4 size in the hopes that patient will be able to do pessary cleaning with removal and reinsertion herself.  Princess Bruins MD, 12:16 PM 12/01/2019

## 2019-12-07 ENCOUNTER — Encounter: Payer: Self-pay | Admitting: Obstetrics & Gynecology

## 2019-12-07 NOTE — Patient Instructions (Signed)
1. Well female exam with routine gynecological exam Normal gynecologic exam in menopause except for uterine prolapse.  No indication to repeat a Pap test this year.  Breast exam normal.  Will schedule a screening mammogram now.  Colonoscopy up-to-date.  Health labs with family physician.  2. Postmenopause Well on no hormone replacement therapy.  No postmenopausal bleeding.  Last bone density July 2020 was normal, will repeat at 5 years.  3. Complete uterine prolapse Well managed on Gelhorn pessary.  4. Encounter for pessary maintenance Will order a Gelhorn pessary 2 1/4 size in the hopes that patient will be able to do pessary cleaning with removal and reinsertion herself.  [name], it was a pleasure seeing you today!

## 2019-12-22 ENCOUNTER — Encounter (INDEPENDENT_AMBULATORY_CARE_PROVIDER_SITE_OTHER): Payer: Medicare PPO | Admitting: Ophthalmology

## 2019-12-23 ENCOUNTER — Other Ambulatory Visit: Payer: Self-pay

## 2019-12-23 ENCOUNTER — Ambulatory Visit (INDEPENDENT_AMBULATORY_CARE_PROVIDER_SITE_OTHER): Payer: Medicare PPO | Admitting: Ophthalmology

## 2019-12-23 ENCOUNTER — Encounter (INDEPENDENT_AMBULATORY_CARE_PROVIDER_SITE_OTHER): Payer: Self-pay | Admitting: Ophthalmology

## 2019-12-23 DIAGNOSIS — H318 Other specified disorders of choroid: Secondary | ICD-10-CM | POA: Diagnosis not present

## 2019-12-23 DIAGNOSIS — H353211 Exudative age-related macular degeneration, right eye, with active choroidal neovascularization: Secondary | ICD-10-CM

## 2019-12-23 MED ORDER — AFLIBERCEPT 2MG/0.05ML IZ SOLN FOR KALEIDOSCOPE
2.0000 mg | INTRAVITREAL | Status: AC | PRN
  Administered 2019-12-23: 2 mg via INTRAVITREAL

## 2019-12-23 NOTE — Progress Notes (Signed)
12/23/2019     CHIEF COMPLAINT Patient presents for Retina Follow Up   HISTORY OF PRESENT ILLNESS: [name] is a 79 y.o. female who presents to the clinic today for:   HPI    Retina Follow Up    Patient presents with  Wet AMD.  In right eye.  Duration of 7 weeks.  Since onset it is stable.          Comments    7 week follow up - OCT OU, Poss Eylea OD Patient denies change in vision and overall has no complaints.        Last edited by Berenice Bouton on 12/23/2019 10:33 AM. (History)      Referring physician: Kirby Funk, MD [address] [place],  [place] [address]  HISTORICAL INFORMATION:   Selected notes from the MEDICAL RECORD NUMBER       CURRENT MEDICATIONS: No current outpatient medications on file. (Ophthalmic Drugs)   No current facility-administered medications for this visit. (Ophthalmic Drugs)   Current Outpatient Medications (Other)  Medication Sig  . CALCIUM PO Take 1 tablet by mouth daily.  . Cyanocobalamin (VITAMIN B-12 PO) Take 1 tablet by mouth daily.  Marland Kitchen FLUoxetine (PROZAC) 40 MG capsule Take 40 mg by mouth every morning.   . valACYclovir (VALTREX) 500 MG tablet TAKE 1 TABLET BY MOUTH EVERY DAY   No current facility-administered medications for this visit. (Other)      REVIEW OF SYSTEMS:    ALLERGIES No Known Allergies  PAST MEDICAL HISTORY Past Medical History:  Diagnosis Date  . Anxiety   . Arthritis   . Atrophic vaginitis   . Detrusor instability   . GERD (gastroesophageal reflux disease)   . Osteoporosis   . Psoriasis   . Sciatic pain   . Uterine prolapse    Past Surgical History:  Procedure Laterality Date  . APPENDECTOMY    . FOOT SURGERY    . KNEE SURGERY    . TOTAL KNEE ARTHROPLASTY Bilateral    X2-- 2009; 2010  . TOTAL KNEE REVISION Left 01/23/2013   Procedure:  TOTAL KNEE ARTHROPLASTY REVISION ;  Surgeon: Loanne Drilling, MD;  Location: [facility];  Service: Orthopedics;  Laterality: Left;     FAMILY HISTORY Family History  Problem Relation Age of Onset  . Cancer Mother        GALLBLADDER   . Breast cancer Cousin        Mat. 1st cousin Age 76    SOCIAL HISTORY Social History   Tobacco Use  . Smoking status: Former Smoker    Packs/day: 0.20    Types: Cigarettes    Quit date: 07/25/2015    Years since quitting: 4.4  . Smokeless tobacco: Never Used  Vaping Use  . Vaping Use: Never used  Substance Use Topics  . Alcohol use: No    Alcohol/week: 0.0 standard drinks  . Drug use: No         OPHTHALMIC EXAM:  Base Eye Exam    Visual Acuity (Snellen - Linear)      Right Left   Dist [place] CF @ 7' 20/25-1   Dist ph [place] NI        Tonometry (Tonopen, 10:38 AM)      Right Left   Pressure 16 15       Pupils      Pupils Dark Light Shape React APD   Right PERRL 4 3 Round Slow None  Left PERRL 4 3 Round Slow None       Visual Fields      Left Right    Full Full       Extraocular Movement      Right Left    Full Full       Neuro/Psych    Oriented x3: Yes   Mood/Affect: Normal       Dilation    Right eye: 1.0% Mydriacyl, 2.5% Phenylephrine @ 11:43 AM        Slit Lamp and Fundus Exam    External Exam      Right Left   External Normal Normal       Slit Lamp Exam      Right Left   Lids/Lashes Normal Normal   Conjunctiva/Sclera White and quiet White and quiet   Cornea Clear Clear   Anterior Chamber Deep and quiet Deep and quiet   Iris Round and reactive Round and reactive   Lens Posterior chamber intraocular lens    Anterior Vitreous Normal Normal       Fundus Exam      Right Left   Posterior Vitreous Posterior vitreous detachment    Disc Normal    C/D Ratio 0.5    Macula Exudates, Macular thickening, Hemorrhage, Subretinal neovascular membrane    Vessels Normal    Periphery Normal           IMAGING AND PROCEDURES  Imaging and Procedures for 12/23/19  OCT, Retina - OU - Both Eyes       Right Eye Quality was good. Scan  locations included subfoveal. Central Foveal Thickness: 159. Progression has improved. Findings include abnormal foveal contour.   Left Eye Quality was good. Scan locations included subfoveal. Central Foveal Thickness: 257. Progression has been stable. Findings include retinal drusen , no SRF, no IRF.   Notes OD, peripapillary CNVM, much less active on Eylea currently at every 7-week examination       Intravitreal Injection, Pharmacologic Agent - OD - Right Eye       Time Out 12/23/2019. 11:46 AM. Confirmed correct patient, procedure, site, and patient consented.   Anesthesia Topical anesthesia was used. Anesthetic medications included Akten 3.5%.   Procedure Preparation included Ofloxacin , 10% betadine to eyelids, 5% betadine to ocular surface. A 30 gauge needle was used.   Injection:  2 mg aflibercept Gretta Cool) SOLN   NDC: L6038910, Lot: [id number]   Route: Intravitreal, Site: Right Eye, Waste: 0 mg  Post-op Post injection exam found visual acuity of at least counting fingers. The patient tolerated the procedure well. There were no complications. The patient received written and verbal post procedure care education. Post injection medications were not given.                 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  We will repeat today and examination in 7-week      ICD-10-CM   1. Exudative age-related macular degeneration of right eye with active choroidal neovascularization (HCC)  H35.3211 OCT, Retina - OU - Both Eyes    Intravitreal Injection, Pharmacologic Agent - OD - Right Eye    aflibercept (EYLEA) SOLN 2 mg  2. Idiopathic polypoidal choroidal vasculopathy  H31.8     1.  2.  3.  Ophthalmic Meds Ordered this visit:  Meds ordered this encounter  Medications  . aflibercept (EYLEA) SOLN 2 mg  Return in  about 7 weeks (around 02/10/2020) for DILATE OU, EYLEA OCT, OD.  There are no Patient Instructions on file for this visit.   Explained the diagnoses, plan, and follow up with the patient and they expressed understanding.  Patient expressed understanding of the importance of proper follow up care.   Alford Highland Shakur Lembo M.D. Diseases & Surgery of the Retina and Vitreous [facility] 12/23/19     Abbreviations: M myopia (nearsighted); A astigmatism; H hyperopia (farsighted); P presbyopia; Mrx spectacle prescription;  CTL contact lenses; OD right eye; OS left eye; OU both eyes  XT exotropia; ET esotropia; PEK punctate epithelial keratitis; PEE punctate epithelial erosions; DES dry eye syndrome; MGD meibomian gland dysfunction; ATs artificial tears; PFAT's preservative free artificial tears; NSC nuclear sclerotic cataract; PSC posterior subcapsular cataract; ERM epi-retinal membrane; PVD posterior vitreous detachment; RD retinal detachment; DM diabetes mellitus; DR diabetic retinopathy; NPDR non-proliferative diabetic retinopathy; PDR proliferative diabetic retinopathy; CSME clinically significant macular edema; DME diabetic macular edema; dbh dot blot hemorrhages; CWS cotton wool spot; POAG primary open angle glaucoma; C/D cup-to-disc ratio; HVF humphrey visual field; GVF goldmann visual field; OCT optical coherence tomography; IOP intraocular pressure; BRVO Branch retinal vein occlusion; CRVO central retinal vein occlusion; CRAO central retinal artery occlusion; BRAO branch retinal artery occlusion; RT retinal tear; SB scleral buckle; PPV pars plana vitrectomy; VH Vitreous hemorrhage; PRP panretinal laser photocoagulation; IVK intravitreal kenalog; VMT vitreomacular traction; MH Macular hole;  NVD neovascularization of the disc; NVE neovascularization elsewhere; AREDS age related eye disease study; ARMD age related macular degeneration; POAG primary open angle glaucoma; EBMD  epithelial/anterior basement membrane dystrophy; ACIOL anterior chamber intraocular lens; IOL intraocular lens; PCIOL posterior chamber intraocular lens; Phaco/IOL phacoemulsification with intraocular lens placement; PRK photorefractive keratectomy; LASIK laser assisted in situ keratomileusis; HTN hypertension; DM diabetes mellitus; COPD chronic obstructive pulmonary disease

## 2019-12-23 NOTE — Assessment & Plan Note (Signed)
Pericapillary CNVM, polypoidal type lesion, improved at every 7-week examination post injection intravitreal Eylea.  We will repeat today and examination in 7-week

## 2020-01-05 ENCOUNTER — Encounter (INDEPENDENT_AMBULATORY_CARE_PROVIDER_SITE_OTHER): Payer: Medicare PPO | Admitting: Ophthalmology

## 2020-01-12 ENCOUNTER — Ambulatory Visit: Payer: Medicare PPO | Admitting: Nurse Practitioner

## 2020-01-28 ENCOUNTER — Ambulatory Visit: Payer: Medicare PPO | Admitting: Nurse Practitioner

## 2020-02-10 ENCOUNTER — Encounter (INDEPENDENT_AMBULATORY_CARE_PROVIDER_SITE_OTHER): Payer: Medicare PPO | Admitting: Ophthalmology

## 2020-02-10 ENCOUNTER — Encounter (INDEPENDENT_AMBULATORY_CARE_PROVIDER_SITE_OTHER): Payer: Self-pay

## 2020-02-25 ENCOUNTER — Encounter (INDEPENDENT_AMBULATORY_CARE_PROVIDER_SITE_OTHER): Payer: Self-pay | Admitting: Ophthalmology

## 2020-02-25 ENCOUNTER — Other Ambulatory Visit: Payer: Self-pay

## 2020-02-25 ENCOUNTER — Ambulatory Visit (INDEPENDENT_AMBULATORY_CARE_PROVIDER_SITE_OTHER): Payer: Medicare PPO | Admitting: Ophthalmology

## 2020-02-25 DIAGNOSIS — H2512 Age-related nuclear cataract, left eye: Secondary | ICD-10-CM | POA: Diagnosis not present

## 2020-02-25 DIAGNOSIS — H353211 Exudative age-related macular degeneration, right eye, with active choroidal neovascularization: Secondary | ICD-10-CM | POA: Diagnosis not present

## 2020-02-25 DIAGNOSIS — H35051 Retinal neovascularization, unspecified, right eye: Secondary | ICD-10-CM | POA: Diagnosis not present

## 2020-02-25 MED ORDER — AFLIBERCEPT 2MG/0.05ML IZ SOLN FOR KALEIDOSCOPE
2.0000 mg | INTRAVITREAL | Status: AC | PRN
  Administered 2020-02-25: 2 mg via INTRAVITREAL

## 2020-02-25 NOTE — Patient Instructions (Signed)
Patient report at any time if new visual acuity distortions or declines occur in either eye

## 2020-02-25 NOTE — Progress Notes (Signed)
02/25/2020     CHIEF COMPLAINT Patient presents for Retina Follow Up   HISTORY OF PRESENT ILLNESS: [name] is a 79 y.o. female who presents to the clinic today for:   HPI    Retina Follow Up    Patient presents with  Wet AMD.  In right eye.  Severity is moderate.  Duration of 9 weeks.  Since onset it is stable.  I, the attending physician,  performed the HPI with the patient and updated documentation appropriately.          Comments    9 Week f\u OU. Possible Eylea OD. OCT  Pt states no changes in vision. Denies any complaints.       Last edited by Elyse Jarvis on 02/25/2020  1:27 PM. (History)      Referring physician: Kirby Funk, MD [address] [place],  [place] [address]  HISTORICAL INFORMATION:   Selected notes from the MEDICAL RECORD NUMBER       CURRENT MEDICATIONS: No current outpatient medications on file. (Ophthalmic Drugs)   No current facility-administered medications for this visit. (Ophthalmic Drugs)   Current Outpatient Medications (Other)  Medication Sig  . CALCIUM PO Take 1 tablet by mouth daily.  . Cyanocobalamin (VITAMIN B-12 PO) Take 1 tablet by mouth daily.  Marland Kitchen FLUoxetine (PROZAC) 40 MG capsule Take 40 mg by mouth every morning.   . valACYclovir (VALTREX) 500 MG tablet TAKE 1 TABLET BY MOUTH EVERY DAY   No current facility-administered medications for this visit. (Other)      REVIEW OF SYSTEMS:    ALLERGIES No Known Allergies  PAST MEDICAL HISTORY Past Medical History:  Diagnosis Date  . Anxiety   . Arthritis   . Atrophic vaginitis   . Detrusor instability   . GERD (gastroesophageal reflux disease)   . Osteoporosis   . Psoriasis   . Sciatic pain   . Uterine prolapse    Past Surgical History:  Procedure Laterality Date  . APPENDECTOMY    . FOOT SURGERY    . KNEE SURGERY    . TOTAL KNEE ARTHROPLASTY Bilateral    X2-- 2009; 2010  . TOTAL KNEE REVISION Left 01/23/2013   Procedure:  TOTAL KNEE  ARTHROPLASTY REVISION ;  Surgeon: Loanne Drilling, MD;  Location: [facility];  Service: Orthopedics;  Laterality: Left;    FAMILY HISTORY Family History  Problem Relation Age of Onset  . Cancer Mother        GALLBLADDER   . Breast cancer Cousin        Mat. 1st cousin Age 20    SOCIAL HISTORY Social History   Tobacco Use  . Smoking status: Former Smoker    Packs/day: 0.20    Types: Cigarettes    Quit date: 07/25/2015    Years since quitting: 4.5  . Smokeless tobacco: Never Used  Vaping Use  . Vaping Use: Never used  Substance Use Topics  . Alcohol use: No    Alcohol/week: 0.0 standard drinks  . Drug use: No         OPHTHALMIC EXAM:  Base Eye Exam    Visual Acuity (Snellen - Linear)      Right Left   Dist cc 20/400 20/30 -2   Dist ph cc NI    Correction: Glasses       Tonometry (Tonopen, 1:34 PM)      Right Left   Pressure 12 16  Pupils      Pupils Dark Light Shape React APD   Right PERRL 4 3 Round Slow None   Left PERRL 4 3 Round Slow None       Visual Fields (Counting fingers)      Left Right    Full Full       Neuro/Psych    Oriented x3: Yes   Mood/Affect: Normal       Dilation    Both eyes: 1.0% Mydriacyl, 2.5% Phenylephrine @ 1:34 PM        Slit Lamp and Fundus Exam    External Exam      Right Left   External Normal Normal       Slit Lamp Exam      Right Left   Lids/Lashes Normal Normal   Conjunctiva/Sclera White and quiet White and quiet   Cornea Clear Clear   Anterior Chamber Deep and quiet Deep and quiet   Iris Round and reactive Round and reactive   Lens Posterior chamber intraocular lens    Anterior Vitreous Normal Normal       Fundus Exam      Right Left   Posterior Vitreous Posterior vitreous detachment    Disc Normal    C/D Ratio 0.5    Macula Exudates, Macular thickening, Hemorrhage, Subretinal neovascular membrane    Vessels Normal    Periphery Normal           IMAGING AND PROCEDURES  Imaging and  Procedures for 02/25/20  OCT, Retina - OU - Both Eyes       Right Eye Quality was good. Scan locations included subfoveal. Central Foveal Thickness: 150. Progression has improved. Findings include no SRF, subretinal scarring.   Left Eye Scan locations included subfoveal. Central Foveal Thickness: 251. Progression has been stable.   Notes Pericapillary CNVM OD much smaller today currently at 7-week follow-up interval post Eylea.  We will repeat injection today And follow-up examination in 8 weeks       Intravitreal Injection, Pharmacologic Agent - OD - Right Eye       Time Out 02/25/2020. 2:04 PM. Confirmed correct patient, procedure, site, and patient consented.   Anesthesia Topical anesthesia was used.   Procedure Preparation included Ofloxacin , 10% betadine to eyelids, 5% betadine to ocular surface, Tobramycin 0.3%. A 30 gauge needle was used.   Injection:  2 mg aflibercept Gretta Cool) SOLN   NDC: L6038910, Lot: [id number]   Route: Intravitreal, Site: Right Eye, Waste: 0 mg  Post-op Post injection exam found visual acuity of at least counting fingers. The patient tolerated the procedure well. There were no complications. The patient received written and verbal post procedure care education. Post injection medications were not given.                 ASSESSMENT/PLAN:  Nuclear sclerotic cataract of left eye The nature of cataract was discussed with the patient as well as the elective nature of surgery. The patient was reassured that surgery at a later date does not put the patient at risk for a worse outcome. It was emphasized that the need for surgery is dictated by the patient's quality of life as influenced by the cataract. Patient was instructed to maintain close follow up with their general eye care doctor.    Choroidal neovascularization of right eye OD much improved after intravitreal Eylea yet residual atrophy in the fovea accounts for acuity  Exudative  age-related macular degeneration of right eye with active choroidal  neovascularization (HCC) Continued improvement OD post treatment of polypoidal choroidal neovascular membrane, wet AMD.  PERI papillary lesion has diminished in size and extent, and foveal atrophy remains accounting for acuity      ICD-10-CM   1. Exudative age-related macular degeneration of right eye with active choroidal neovascularization (HCC)  H35.3211 OCT, Retina - OU - Both Eyes    Intravitreal Injection, Pharmacologic Agent - OD - Right Eye    aflibercept (EYLEA) SOLN 2 mg  2. Nuclear sclerotic cataract of left eye  H25.12   3. Choroidal neovascularization of right eye  H35.051     1.  Repeat injection today intravitreal Eylea at 7-week interval and follow-up examination right eye in 8 weeks  2.  3.  Ophthalmic Meds Ordered this visit:  Meds ordered this encounter  Medications  . aflibercept (EYLEA) SOLN 2 mg       Return in about 8 weeks (around 04/21/2020) for dilate, OD, EYLEA OCT.  There are no Patient Instructions on file for this visit.   Explained the diagnoses, plan, and follow up with the patient and they expressed understanding.  Patient expressed understanding of the importance of proper follow up care.   Alford Highland Marrisa Kimber M.D. Diseases & Surgery of the Retina and Vitreous [facility] 02/25/20     Abbreviations: M myopia (nearsighted); A astigmatism; H hyperopia (farsighted); P presbyopia; Mrx spectacle prescription;  CTL contact lenses; OD right eye; OS left eye; OU both eyes  XT exotropia; ET esotropia; PEK punctate epithelial keratitis; PEE punctate epithelial erosions; DES dry eye syndrome; MGD meibomian gland dysfunction; ATs artificial tears; PFAT's preservative free artificial tears; NSC nuclear sclerotic cataract; PSC posterior subcapsular cataract; ERM epi-retinal membrane; PVD posterior vitreous detachment; RD retinal detachment; DM diabetes mellitus; DR diabetic  retinopathy; NPDR non-proliferative diabetic retinopathy; PDR proliferative diabetic retinopathy; CSME clinically significant macular edema; DME diabetic macular edema; dbh dot blot hemorrhages; CWS cotton wool spot; POAG primary open angle glaucoma; C/D cup-to-disc ratio; HVF humphrey visual field; GVF goldmann visual field; OCT optical coherence tomography; IOP intraocular pressure; BRVO Branch retinal vein occlusion; CRVO central retinal vein occlusion; CRAO central retinal artery occlusion; BRAO branch retinal artery occlusion; RT retinal tear; SB scleral buckle; PPV pars plana vitrectomy; VH Vitreous hemorrhage; PRP panretinal laser photocoagulation; IVK intravitreal kenalog; VMT vitreomacular traction; MH Macular hole;  NVD neovascularization of the disc; NVE neovascularization elsewhere; AREDS age related eye disease study; ARMD age related macular degeneration; POAG primary open angle glaucoma; EBMD epithelial/anterior basement membrane dystrophy; ACIOL anterior chamber intraocular lens; IOL intraocular lens; PCIOL posterior chamber intraocular lens; Phaco/IOL phacoemulsification with intraocular lens placement; PRK photorefractive keratectomy; LASIK laser assisted in situ keratomileusis; HTN hypertension; DM diabetes mellitus; COPD chronic obstructive pulmonary disease

## 2020-02-25 NOTE — Assessment & Plan Note (Signed)

## 2020-02-25 NOTE — Assessment & Plan Note (Signed)
OD much improved after intravitreal Eylea yet residual atrophy in the fovea accounts for acuity

## 2020-02-25 NOTE — Assessment & Plan Note (Signed)
Continued improvement OD post treatment of polypoidal choroidal neovascular membrane, wet AMD.  PERI papillary lesion has diminished in size and extent, and foveal atrophy remains accounting for acuity

## 2020-04-05 ENCOUNTER — Other Ambulatory Visit: Payer: Self-pay | Admitting: Obstetrics & Gynecology

## 2020-04-21 ENCOUNTER — Encounter (INDEPENDENT_AMBULATORY_CARE_PROVIDER_SITE_OTHER): Payer: Medicare PPO | Admitting: Ophthalmology

## 2020-04-25 DIAGNOSIS — S86092D Other specified injury of left Achilles tendon, subsequent encounter: Secondary | ICD-10-CM | POA: Diagnosis not present

## 2020-04-25 DIAGNOSIS — M722 Plantar fascial fibromatosis: Secondary | ICD-10-CM | POA: Diagnosis not present

## 2020-05-03 ENCOUNTER — Other Ambulatory Visit: Payer: Self-pay

## 2020-05-03 ENCOUNTER — Ambulatory Visit (INDEPENDENT_AMBULATORY_CARE_PROVIDER_SITE_OTHER): Payer: Medicare PPO | Admitting: Ophthalmology

## 2020-05-03 ENCOUNTER — Encounter (INDEPENDENT_AMBULATORY_CARE_PROVIDER_SITE_OTHER): Payer: Self-pay | Admitting: Ophthalmology

## 2020-05-03 DIAGNOSIS — H3561 Retinal hemorrhage, right eye: Secondary | ICD-10-CM

## 2020-05-03 DIAGNOSIS — H353211 Exudative age-related macular degeneration, right eye, with active choroidal neovascularization: Secondary | ICD-10-CM

## 2020-05-03 MED ORDER — AFLIBERCEPT 2MG/0.05ML IZ SOLN FOR KALEIDOSCOPE
2.0000 mg | INTRAVITREAL | Status: AC | PRN
  Administered 2020-05-03: 2 mg via INTRAVITREAL

## 2020-05-03 NOTE — Progress Notes (Signed)
05/03/2020     CHIEF COMPLAINT Patient presents for Retina Follow Up   HISTORY OF PRESENT ILLNESS: [name] is a 79 y.o. female who presents to the clinic today for:   HPI    Retina Follow Up    Patient presents with  Wet AMD.  In right eye.  This started 10 weeks ago.  Severity is mild.  Duration of 10 weeks.  Since onset it is stable.          Comments    10 Week AMD F/U OD, poss Eylea OD  Pt denies noticeable changes to [place] OU since last visit. Pt denies ocular pain, flashes of light, or floaters OU.         Last edited by Ileana Roup, COA on 05/03/2020  3:08 PM. (History)      Referring physician: Kirby Funk, MD [address] [place],  [place] [address]  HISTORICAL INFORMATION:   Selected notes from the MEDICAL RECORD NUMBER       CURRENT MEDICATIONS: No current outpatient medications on file. (Ophthalmic Drugs)   No current facility-administered medications for this visit. (Ophthalmic Drugs)   Current Outpatient Medications (Other)  Medication Sig  . CALCIUM PO Take 1 tablet by mouth daily.  . Cyanocobalamin (VITAMIN B-12 PO) Take 1 tablet by mouth daily.  Marland Kitchen FLUoxetine (PROZAC) 40 MG capsule Take 40 mg by mouth every morning.   . valACYclovir (VALTREX) 500 MG tablet TAKE 1 TABLET BY MOUTH EVERY DAY   No current facility-administered medications for this visit. (Other)      REVIEW OF SYSTEMS:    ALLERGIES No Known Allergies  PAST MEDICAL HISTORY Past Medical History:  Diagnosis Date  . Anxiety   . Arthritis   . Atrophic vaginitis   . Detrusor instability   . GERD (gastroesophageal reflux disease)   . Osteoporosis   . Psoriasis   . Sciatic pain   . Uterine prolapse    Past Surgical History:  Procedure Laterality Date  . APPENDECTOMY    . FOOT SURGERY    . KNEE SURGERY    . TOTAL KNEE ARTHROPLASTY Bilateral    X2-- 2009; 2010  . TOTAL KNEE REVISION Left 01/23/2013   Procedure:  TOTAL KNEE ARTHROPLASTY  REVISION ;  Surgeon: Loanne Drilling, MD;  Location: [facility];  Service: Orthopedics;  Laterality: Left;    FAMILY HISTORY Family History  Problem Relation Age of Onset  . Cancer Mother        GALLBLADDER   . Breast cancer Cousin        Mat. 1st cousin Age 44    SOCIAL HISTORY Social History   Tobacco Use  . Smoking status: Former Smoker    Packs/day: 0.20    Types: Cigarettes    Quit date: 07/25/2015    Years since quitting: 4.7  . Smokeless tobacco: Never Used  Vaping Use  . Vaping Use: Never used  Substance Use Topics  . Alcohol use: No    Alcohol/week: 0.0 standard drinks  . Drug use: No         OPHTHALMIC EXAM:  Base Eye Exam    Visual Acuity (ETDRS)      Right Left   Dist cc CF @ 5' 20/25 -1   Dist ph cc NI    Correction: Glasses       Tonometry (Tonopen, 3:09 PM)      Right Left   Pressure 19 13  Difficult IOP,  squeezing       Pupils      Pupils Dark Light Shape React APD   Right PERRL 4 3 Round Slow None   Left PERRL 4 3 Round Slow None       Visual Fields (Counting fingers)      Left Right    Full Full       Extraocular Movement      Right Left    Full Full       Neuro/Psych    Oriented x3: Yes   Mood/Affect: Normal       Dilation    Right eye: 1.0% Mydriacyl, 2.5% Phenylephrine @ 3:12 PM        Slit Lamp and Fundus Exam    External Exam      Right Left   External Normal Normal       Slit Lamp Exam      Right Left   Lids/Lashes Normal Normal   Conjunctiva/Sclera White and quiet White and quiet   Cornea Clear Clear   Anterior Chamber Deep and quiet Deep and quiet   Iris Round and reactive Round and reactive   Lens Posterior chamber intraocular lens    Anterior Vitreous Normal Normal       Fundus Exam      Right Left   Posterior Vitreous Posterior vitreous detachment    Disc Normal    C/D Ratio 0.5    Macula Exudates, Macular thickening, Hemorrhage, Subretinal neovascular membrane    Vessels Normal    Periphery  Normal           IMAGING AND PROCEDURES  Imaging and Procedures for 05/03/20  OCT, Retina - OU - Both Eyes       Right Eye Quality was good. Scan locations included subfoveal. Central Foveal Thickness: 149. Progression has been stable. Findings include subretinal scarring, outer retinal atrophy, central retinal atrophy.   Left Eye Quality was good. Scan locations included subfoveal. Central Foveal Thickness: 260. Progression has been stable.   Notes Much less active CNVM.  Papillary from polypoidal currently at 10-week interval       Intravitreal Injection, Pharmacologic Agent - OD - Right Eye       Time Out 05/03/2020. 4:21 PM. Confirmed correct patient, procedure, site, and patient consented.   Anesthesia Topical anesthesia was used.   Procedure Preparation included Ofloxacin , 10% betadine to eyelids, 5% betadine to ocular surface, Tobramycin 0.3%. A 30 gauge needle was used.   Injection:  2 mg aflibercept Gretta Cool) SOLN   NDC: [id number], Lot: [id number]   Route: Intravitreal, Site: Right Eye, Waste: 0 mg  Post-op Post injection exam found visual acuity of at least counting fingers. The patient tolerated the procedure well. There were no complications. The patient received written and verbal post procedure care education. Post injection medications were not given.                 ASSESSMENT/PLAN:  Exudative age-related macular degeneration of right eye with active choroidal neovascularization (HCC) Much less active polypoidal choroidal neovascular membrane, pericapillary, improved on Eylea yet with a residual macular atrophy accounting for acuity  At 10-week interval today  Retinal hemorrhage of right eye Much less, as CNVM is waned      ICD-10-CM   1. Exudative age-related macular degeneration of right eye with active choroidal neovascularization (HCC)  H35.3211 OCT, Retina - OU - Both Eyes    Intravitreal Injection, Pharmacologic Agent - OD -  Right Eye  aflibercept (EYLEA) SOLN 2 mg  2. Retinal hemorrhage of right eye  H35.61     1.  Repeat injection Eylea OD today currently at 10-week interval and will extend interval examination to 3 months  2.  3.  Ophthalmic Meds Ordered this visit:  Meds ordered this encounter  Medications  . aflibercept (EYLEA) SOLN 2 mg       Return in about 3 months (around 08/03/2020) for DILATE OU, OD, EYLEA OCT.  There are no Patient Instructions on file for this visit.   Explained the diagnoses, plan, and follow up with the patient and they expressed understanding.  Patient expressed understanding of the importance of proper follow up care.   Alford Highland Cierrah Dace M.D. Diseases & Surgery of the Retina and Vitreous [facility] 05/03/20     Abbreviations: M myopia (nearsighted); A astigmatism; H hyperopia (farsighted); P presbyopia; Mrx spectacle prescription;  CTL contact lenses; OD right eye; OS left eye; OU both eyes  XT exotropia; ET esotropia; PEK punctate epithelial keratitis; PEE punctate epithelial erosions; DES dry eye syndrome; MGD meibomian gland dysfunction; ATs artificial tears; PFAT's preservative free artificial tears; NSC nuclear sclerotic cataract; PSC posterior subcapsular cataract; ERM epi-retinal membrane; PVD posterior vitreous detachment; RD retinal detachment; DM diabetes mellitus; DR diabetic retinopathy; NPDR non-proliferative diabetic retinopathy; PDR proliferative diabetic retinopathy; CSME clinically significant macular edema; DME diabetic macular edema; dbh dot blot hemorrhages; CWS cotton wool spot; POAG primary open angle glaucoma; C/D cup-to-disc ratio; HVF humphrey visual field; GVF goldmann visual field; OCT optical coherence tomography; IOP intraocular pressure; BRVO Branch retinal vein occlusion; CRVO central retinal vein occlusion; CRAO central retinal artery occlusion; BRAO branch retinal artery occlusion; RT retinal tear; SB scleral buckle; PPV  pars plana vitrectomy; VH Vitreous hemorrhage; PRP panretinal laser photocoagulation; IVK intravitreal kenalog; VMT vitreomacular traction; MH Macular hole;  NVD neovascularization of the disc; NVE neovascularization elsewhere; AREDS age related eye disease study; ARMD age related macular degeneration; POAG primary open angle glaucoma; EBMD epithelial/anterior basement membrane dystrophy; ACIOL anterior chamber intraocular lens; IOL intraocular lens; PCIOL posterior chamber intraocular lens; Phaco/IOL phacoemulsification with intraocular lens placement; PRK photorefractive keratectomy; LASIK laser assisted in situ keratomileusis; HTN hypertension; DM diabetes mellitus; COPD chronic obstructive pulmonary disease

## 2020-05-03 NOTE — Assessment & Plan Note (Signed)
Much less active polypoidal choroidal neovascular membrane, pericapillary, improved on Eylea yet with a residual macular atrophy accounting for acuity  At 10-week interval today

## 2020-05-03 NOTE — Assessment & Plan Note (Signed)
Much less, as CNVM is waned

## 2020-05-03 NOTE — Patient Instructions (Signed)
Patient instructed to l to inform the office of new onset visual acuity distortions or decline

## 2020-05-16 DIAGNOSIS — F411 Generalized anxiety disorder: Secondary | ICD-10-CM | POA: Diagnosis not present

## 2020-05-19 ENCOUNTER — Ambulatory Visit: Payer: Medicare PPO | Admitting: Physical Therapy

## 2020-05-19 DIAGNOSIS — H353110 Nonexudative age-related macular degeneration, right eye, stage unspecified: Secondary | ICD-10-CM | POA: Diagnosis not present

## 2020-05-19 DIAGNOSIS — H52223 Regular astigmatism, bilateral: Secondary | ICD-10-CM | POA: Diagnosis not present

## 2020-05-19 DIAGNOSIS — H5203 Hypermetropia, bilateral: Secondary | ICD-10-CM | POA: Diagnosis not present

## 2020-05-19 DIAGNOSIS — H524 Presbyopia: Secondary | ICD-10-CM | POA: Diagnosis not present

## 2020-05-19 DIAGNOSIS — H3561 Retinal hemorrhage, right eye: Secondary | ICD-10-CM | POA: Diagnosis not present

## 2020-05-19 DIAGNOSIS — H43813 Vitreous degeneration, bilateral: Secondary | ICD-10-CM | POA: Diagnosis not present

## 2020-06-03 ENCOUNTER — Encounter: Payer: Self-pay | Admitting: Physical Therapy

## 2020-06-03 ENCOUNTER — Ambulatory Visit: Payer: Medicare PPO | Attending: Podiatry | Admitting: Physical Therapy

## 2020-06-03 ENCOUNTER — Other Ambulatory Visit: Payer: Self-pay

## 2020-06-03 DIAGNOSIS — M25572 Pain in left ankle and joints of left foot: Secondary | ICD-10-CM | POA: Diagnosis not present

## 2020-06-03 DIAGNOSIS — M6281 Muscle weakness (generalized): Secondary | ICD-10-CM | POA: Diagnosis not present

## 2020-06-03 DIAGNOSIS — M25672 Stiffness of left ankle, not elsewhere classified: Secondary | ICD-10-CM | POA: Diagnosis not present

## 2020-06-03 DIAGNOSIS — R262 Difficulty in walking, not elsewhere classified: Secondary | ICD-10-CM | POA: Insufficient documentation

## 2020-06-03 DIAGNOSIS — R600 Localized edema: Secondary | ICD-10-CM | POA: Diagnosis not present

## 2020-06-03 NOTE — Therapy (Signed)
[facility] [address] [place], [place], [address] Phone: [phone]   Fax:  [phone]  Physical Therapy Evaluation  Patient Details  Name: [name] MRN: [id number] Date of Birth: 1940/07/17 Referring Provider (PT): Karren Burly, DPM   Encounter Date: 06/03/2020   PT End of Session - 06/03/20 0809    Visit Number 1    Number of Visits 12    Date for PT Re-Evaluation 07/22/20    Authorization Type [employer] MCR-requesting visit authorization    PT Start Time 0714    PT Stop Time 0802    PT Time Calculation (min) 48 min    Activity Tolerance Patient tolerated treatment well    Behavior During Therapy [facility] for tasks assessed/performed           Past Medical History:  Diagnosis Date  . Anxiety   . Arthritis   . Atrophic vaginitis   . Detrusor instability   . GERD (gastroesophageal reflux disease)   . Osteoporosis   . Psoriasis   . Sciatic pain   . Uterine prolapse     Past Surgical History:  Procedure Laterality Date  . APPENDECTOMY    . FOOT SURGERY    . KNEE SURGERY    . TOTAL KNEE ARTHROPLASTY Bilateral    X2-- 2009; 2010  . TOTAL KNEE REVISION Left 01/23/2013   Procedure:  TOTAL KNEE ARTHROPLASTY REVISION ;  Surgeon: Loanne Drilling, MD;  Location: [facility];  Service: Orthopedics;  Laterality: Left;    There were no vitals filed for this visit.    Subjective Assessment - 06/03/20 0718    Subjective Pt. sustained Achilles rupture 02/18/20-she was walking and felt "pop" in Achilles region and subsequently fell. She saw DPM and was diagnosed with Achilles rupture (partial). Pt. also saw orthopedist within 2 weeks with no surgery recommended at the time. She wore CAM boot for about 9 weeks with d/c x 1 month ago. She has since been having more issues with balance and falls. She currently ambulates with a SPC but was independent for community ambulation without AD prior to injury.    Pertinent History  osteoporosis, bilateral TKA    Limitations Standing;Walking;House hold activities;Lifting    Diagnostic tests [place]    Patient Stated Goals Be able to walk without cane and walk outside for exercises    Currently in Pain? No/denies              [employer] PT Assessment - 06/03/20 0001      Assessment   Medical Diagnosis s/p left Achilles rupture    Referring Provider (PT) Karren Burly, DPM    Onset Date/Surgical Date 02/18/20    Hand Dominance Right    Prior Therapy none      Precautions   Precautions None      Restrictions   Weight Bearing Restrictions No      Balance Screen   Has the patient fallen in the past 6 months Yes    How many times? 10-11    Has the patient had a decrease in activity level because of a fear of falling?  Yes      Home Environment   Living Environment Private residence    Living Arrangements Spouse/significant other    Type of Home House    Home Access Stairs to enter    Entrance Stairs-Number of Steps 2    Entrance Stairs-Rails Right    Home Layout Two level    Alternate  Level Stairs-Number of Steps 13    Alternate Level Stairs-Rails Right    Home Equipment Cane - single point;Walker - 2 wheels      Prior Function   Level of Independence Independent with basic ADLs;Independent with community mobility without device      Cognition   Overall Cognitive Status Within Functional Limits for tasks assessed      Observation/Other Assessments   Focus on Therapeutic Outcomes (FOTO)  60% limited      Observation/Other Assessments-Edema    Edema Circumferential;Figure 8      Circumferential Edema   Circumferential - Right 28 cm   calf 8 cm proximal to lateral malleolus   Circumferential - Left  30 cm   calf 8 cm proximal to lateral malleolus     Figure 8 Edema   Figure 8 - Right  55.5 cm    Figure 8 - Left  56 cm      Deep Tendon Reflexes   DTR Assessment Site Patella;Achilles    Patella DTR 2+    Achilles DTR 2+      ROM / Strength   AROM  / PROM / Strength AROM;PROM;Strength      AROM   AROM Assessment Site Knee;Ankle    Right/Left Knee Right;Left    Right Knee Extension -2   lacking 2 deg from neutral   Right Knee Flexion 110    Left Knee Extension -2   lacking 2 deg from neutral   Left Knee Flexion 105    Right/Left Ankle Right;Left    Right Ankle Dorsiflexion -3    Right Ankle Plantar Flexion 45    Right Ankle Inversion 32    Right Ankle Eversion 12    Left Ankle Dorsiflexion 0    Left Ankle Plantar Flexion 25    Left Ankle Inversion 24    Left Ankle Eversion 11      PROM   PROM Assessment Site Ankle    Right/Left Ankle Left    Left Ankle Dorsiflexion 2      Strength   Strength Assessment Site Ankle;Knee;Hip    Right/Left Hip Right;Left    Right Hip Flexion 4/5    Right Hip External Rotation  4+/5    Right Hip Internal Rotation 5/5    Left Hip Flexion 4-/5    Left Hip External Rotation 5/5    Left Hip Internal Rotation 4+/5    Right/Left Knee Right;Left    Right Knee Flexion 5/5    Right Knee Extension 5/5    Left Knee Flexion 5/5    Left Knee Extension 4+/5    Right/Left Ankle Right;Left    Right Ankle Dorsiflexion 5/5    Right Ankle Plantar Flexion 5/5   in supine only   Right Ankle Inversion 5/5    Right Ankle Eversion 5/5    Left Ankle Dorsiflexion 4/5    Left Ankle Plantar Flexion 4+/5    Left Ankle Inversion 4+/5    Left Ankle Eversion 4+/5      Palpation   Palpation comment heel spur noted on left      Transfers   Transfers Sit to Stand;Stand to Sit;Stand Pivot Transfers    Sit to Stand 6: Modified independent (Device/Increase time)    Stand to Sit 6: Modified independent (Device/Increase time)    Stand Pivot Transfers 6: Modified independent (Device/Increase time)      Ambulation/Gait   Gait Comments Pt. ambulates mod I with SPC with decreased right steplnegth vs.  left, decreased push off on left with terminal stance      Standardized Balance Assessment   Standardized Balance  Assessment Berg Balance Test      Berg Balance Test   Sit to Stand Able to stand  independently using hands    Standing Unsupported Able to stand safely 2 minutes    Sitting with Back Unsupported but Feet Supported on Floor or Stool Able to sit safely and securely 2 minutes    Stand to Sit Controls descent by using hands    Transfers Able to transfer safely, definite need of hands    Standing Unsupported with Eyes Closed Needs help to keep from falling    Standing Unsupported with Feet Together Able to place feet together independently but unable to hold for 30 seconds    From Standing, Reach Forward with Outstretched Arm Can reach forward >12 cm safely (5")    From Standing Position, Pick up Object from Floor Able to pick up shoe, needs supervision    From Standing Position, Turn to Look Behind Over each Shoulder Looks behind one side only/other side shows less weight shift    Turn 360 Degrees Needs assistance while turning    Standing Unsupported, Alternately Place Feet on Step/Stool Needs assistance to keep from falling or unable to try    Standing Unsupported, One Foot in Front Able to plae foot ahead of the other independently and hold 30 seconds    Standing on One Leg Tries to lift leg/unable to hold 3 seconds but remains standing independently    Total Score 32                      Objective measurements completed on examination: See above findings.       [facility] Adult PT Treatment/Exercise - 06/03/20 0001      Exercises   Exercises --   HEP handout review                 PT Education - 06/03/20 0809    Education Details symptom etiology, HEP, POC, eval findings    Person(s) Educated Patient    Methods Explanation;Demonstration;Verbal cues;Tactile cues;Handout    Comprehension Verbalized understanding            PT Short Term Goals - 06/03/20 0817      PT SHORT TERM GOAL #1   Title Independent with HEP    Baseline needs HEP    Time 3    Period  Weeks    Status New      PT SHORT TERM GOAL #2   Title Improve left ankle DF AROM 3-5 deg to improve toe clearance with gait    Baseline 0 deg    Time 3    Period Weeks    Status New             PT Long Term Goals - 06/03/20 0818      PT LONG TERM GOAL #1   Title Improve FOTO outcome measure score to 39% or less impairment    Baseline 60% limited    Time 6    Period Weeks    Status New    Target Date 07/22/20      PT LONG TERM GOAL #2   Title Improve Berg Balance score t0 40/56 or greater to work towards decreased fall risk    Baseline 32/56    Time 6    Period Weeks    Status New  Target Date 07/22/20      PT LONG TERM GOAL #3   Title Pt. to be able to perform community level ambulation independently without AD    Baseline mod I with SPC    Time 6    Period Weeks    Status New    Target Date 07/22/20      PT LONG TERM GOAL #4   Title Increase left ankle strength grossly 1/2 MMT grade to improve ankle stability for outdoor ambulation over uneven surfaces    Baseline 4+/5 except ankle DF 4/5, ankle PF tested in supine    Time 6    Period Weeks    Status New    Target Date 07/22/20      PT LONG TERM GOAL #5   Title Tolerate standing and ambulation for chores and shopping periods at least 30-40 min with ankle/heel pain 2/10 or less    Baseline No pain at rest at eval but intermittent higher pain with prolonged activity    Time 6    Period Weeks    Status New    Target Date 07/22/20                  Plan - 06/03/20 0813    Clinical Impression Statement Pt. presents >3 months s/p left Achilles rupture injury managed non-operatively with left ankle and left LE weakness, decreased ankle ROM/ankle stiffness, and decreased balance with Berg score suggestive of increased fall risk. Pt. would benefit from PT to help address current deficits to improve ability and safety for functional mobility.    Personal Factors and Comorbidities Comorbidity 2     Comorbidities oestoporosis, bilat. TKA    Examination-Activity Limitations Stand;Stairs;Locomotion Level;Lift;Transfers;Carry;Squat    Examination-Participation Restrictions Shop;Meal Prep;Community Activity;Laundry;Cleaning    Stability/Clinical Decision Making Stable/Uncomplicated    Clinical Decision Making Low    Rehab Potential Good    PT Frequency 2x / week    PT Duration 6 weeks    PT Treatment/Interventions ADLs/Self Care Home Management;Cryotherapy;Electrical Stimulation;Moist Heat;Gait training;Stair training;Functional mobility training;Therapeutic activities;Neuromuscular re-education;Therapeutic exercise;Patient/family education;Manual techniques;Passive range of motion;Vasopneumatic Device;Dry needling;Taping;Balance training    PT Next Visit Plan review HEP as needed including ankle Theraband 4-way, trial NUSTEP warm up, seated BAPS, seated vs. supine ankle ROM, progress standing closed chain activities including partial squats and step ups as tolerated, balance challenges and gait, manual prn for PROM, ankle mobs but caution osteoporosis, gentle gastroc stretches in pain free range    PT Home Exercise Plan Access code: [email or web address]    Consulted and Agree with Plan of Care Patient           Patient will benefit from skilled therapeutic intervention in order to improve the following deficits and impairments:  Pain,Impaired flexibility,Decreased strength,Decreased activity tolerance,Decreased range of motion,Difficulty walking,Decreased balance,Abnormal gait  Visit Diagnosis: Pain in left ankle and joints of left foot  Stiffness of left ankle, not elsewhere classified  Localized edema  Difficulty in walking, not elsewhere classified  Muscle weakness (generalized)     Problem List Patient Active Problem List   Diagnosis Date Noted  . Nuclear sclerotic cataract of left eye 02/25/2020  . 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  . Exudative age-related  macular degeneration of right eye with active choroidal neovascularization (HCC) 09/24/2019  . Choroidal neovascularization of right eye 09/24/2019  . Retinal hemorrhage of right eye 09/24/2019  . Retinal exudates and deposits 09/24/2019  . Cystoid macular edema of right eye 09/24/2019  . Choroidal retinal neovascularization  09/24/2019  . Mastodynia, female 03/19/2016  . Cystocele 02/08/2014  . Vaginal atrophy 01/28/2014  . History of herpes simplex infection 01/28/2014  . Knee pain, acute 03/05/2013  . Anxiety 02/05/2013  . Constipation 02/05/2013  . Depression 02/05/2013  . Psoriasis 02/05/2013  . Failed total knee arthroplasty (HCC) 01/23/2013  . Sciatic pain   . Detrusor instability   . Uterine prolapse   . Atrophic vaginitis   . Osteoporosis        Referring diagnosis? A54.098JS86.092D Treatment diagnosis? (if different than referring diagnosis) M25.572, M25.672, R60.0, R26.2, M62.81 What was this (referring dx) caused by? []  Surgery []  Fall []  Ongoing issue []  Arthritis [x]  Other: ___spontaneous rupture resulting in fall_________  Laterality: []  Rt [x]  Lt []  Both  Check all possible CPT codes:      [x]  97110 (Therapeutic Exercise)  []  92507 (SLP Treatment)  [x]  [id number] (Neuro Re-ed)   []  92526 (Swallowing Treatment)   [x]  97116 (Gait Training)   []  [id number] (Cognitive Training, 1st 15 minutes) [x]  97140 (Manual Therapy)   []  97130 (Cognitive Training, each add'l 15 minutes)  [x]  97530 (Therapeutic Activities)  []  Other, List CPT Code ____________    [x]  97535 (Self Care)       []  All codes above (97110 - 97535)  []  97012 (Mechanical Traction)  [x]  97014 (E-stim Unattended)  [x]  97032 (E-stim manual)  []  97033 (Ionto)  []  97035 (Ultrasound)  []  97760 (Orthotic Fit) []  [id number] (Physical Performance Training) []  [id number] (Aquatic Therapy) []  97034 (Contrast Bath) []  [id number] (Paraffin) []  97597 (Wound Care 1st 20 sq cm) []  97598 (Wound Care each add'l 20 sq  cm)     Lazarus Gowdahristopher Daksh Coates, PT, DPT 06/03/20 8:28 AM     [facility] [address] [place], [place], [address] Phone: [phone]   Fax:  [phone]  Name: [name] MRN: [id number] Date of Birth: 09/17/1940

## 2020-06-22 ENCOUNTER — Ambulatory Visit: Payer: Medicare PPO | Attending: Podiatry | Admitting: Physical Therapy

## 2020-06-22 ENCOUNTER — Encounter: Payer: Self-pay | Admitting: Physical Therapy

## 2020-06-22 ENCOUNTER — Other Ambulatory Visit: Payer: Self-pay

## 2020-06-22 DIAGNOSIS — R600 Localized edema: Secondary | ICD-10-CM | POA: Insufficient documentation

## 2020-06-22 DIAGNOSIS — M6281 Muscle weakness (generalized): Secondary | ICD-10-CM | POA: Insufficient documentation

## 2020-06-22 DIAGNOSIS — M25572 Pain in left ankle and joints of left foot: Secondary | ICD-10-CM | POA: Insufficient documentation

## 2020-06-22 DIAGNOSIS — R262 Difficulty in walking, not elsewhere classified: Secondary | ICD-10-CM | POA: Diagnosis not present

## 2020-06-22 DIAGNOSIS — M25672 Stiffness of left ankle, not elsewhere classified: Secondary | ICD-10-CM | POA: Insufficient documentation

## 2020-06-22 NOTE — Therapy (Signed)
[facility] Outpatient Rehabilitation [facility] [address] [place], [place], [address] Phone: [phone]   Fax:  [phone]  Physical Therapy Treatment  Patient Details  Name: [name] MRN: [id number] Date of Birth: 08/01/1940 Referring Provider (PT): Karren Burly, DPM   Encounter Date: 06/22/2020   PT End of Session - 06/22/20 1136    Visit Number 2    Number of Visits 12    Date for PT Re-Evaluation 07/22/20    Authorization Type [employer] MCR-pending authorization    PT Start Time 1017    PT Stop Time 1059    PT Time Calculation (min) 42 min    Activity Tolerance Patient tolerated treatment well    Behavior During Therapy [facility] for tasks assessed/performed           Past Medical History:  Diagnosis Date  . Anxiety   . Arthritis   . Atrophic vaginitis   . Detrusor instability   . GERD (gastroesophageal reflux disease)   . Osteoporosis   . Psoriasis   . Sciatic pain   . Uterine prolapse     Past Surgical History:  Procedure Laterality Date  . APPENDECTOMY    . FOOT SURGERY    . KNEE SURGERY    . TOTAL KNEE ARTHROPLASTY Bilateral    X2-- 2009; 2010  . TOTAL KNEE REVISION Left 01/23/2013   Procedure:  TOTAL KNEE ARTHROPLASTY REVISION ;  Surgeon: Loanne Drilling, MD;  Location: [facility];  Service: Orthopedics;  Laterality: Left;    There were no vitals filed for this visit.   Subjective Assessment - 06/22/20 1021    Subjective No pain pre-tx. but pt. reports continued difficulty walking and trouble with balance. She reports has not had time to do HEP much since eval. Pt. is concerned about her balance and is worried that she has a neurological issue such as MS.    Pertinent History osteoporosis, bilateral TKA    Currently in Pain? No/denies                             [facility] Adult PT Treatment/Exercise - 06/22/20 0001      Exercises   Exercises Ankle;Knee/Hip      Knee/Hip Exercises: Standing   Hip Abduction  AROM;Stengthening;Both;15 reps;Knee straight    Abduction Limitations initially tried with 2 lb. ankle weights but difficulty due to weakness so switched to bodyweight only    Forward Step Up Left;Right;1 set;10 reps;Hand Hold: 2;Step Height: 4"    Functional Squat 15 reps    Functional Squat Limitations partial squat at counter with bilat. UE support      Knee/Hip Exercises: Seated   Clamshell with TheraBand Red   2x10     Knee/Hip Exercises: Supine   Bridges Both;AROM;Strengthening;15 reps    Bridges Limitations glut set to partial hip bridge      Manual Therapy   Manual Therapy Joint mobilization    Joint Mobilization grade I-III left ankle talocrural AP mobilization      Ankle Exercises: Stretches   Gastroc Stretch 3 reps;20 seconds    Gastroc Stretch Limitations gentle supine manual left gastroc stretch in pain-free range      Ankle Exercises: Aerobic   Nustep L3 x 5 min UE/LE      Ankle Exercises: Standing   Rocker Board 1 minute    Rocker Board Limitations dynamic lateral balance    Heel Raises 15 reps  Heel Raises Limitations with right weightshift    Other Standing Ankle Exercises Romberg foam eyes open 20 sec x 3 with intermittent hand support on counter, Airex marches x 10 reps      Ankle Exercises: Seated   BAPS Level 2;Sitting;15 reps      Ankle Exercises: Supine   T-Band Theraband ankle 4-way red band 2x10 ea.                  PT Education - 06/22/20 1135    Education Details exercises, POC    Person(s) Educated Patient    Methods Explanation;Demonstration;Verbal cues    Comprehension Verbalized understanding;Returned demonstration;Verbal cues required            PT Short Term Goals - 06/03/20 0817      PT SHORT TERM GOAL #1   Title Independent with HEP    Baseline needs HEP    Time 3    Period Weeks    Status New      PT SHORT TERM GOAL #2   Title Improve left ankle DF AROM 3-5 deg to improve toe clearance with gait    Baseline 0  deg    Time 3    Period Weeks    Status New             PT Long Term Goals - 06/03/20 0818      PT LONG TERM GOAL #1   Title Improve FOTO outcome measure score to 39% or less impairment    Baseline 60% limited    Time 6    Period Weeks    Status New    Target Date 07/22/20      PT LONG TERM GOAL #2   Title Improve Berg Balance score t0 40/56 or greater to work towards decreased fall risk    Baseline 32/56    Time 6    Period Weeks    Status New    Target Date 07/22/20      PT LONG TERM GOAL #3   Title Pt. to be able to perform community level ambulation independently without AD    Baseline mod I with SPC    Time 6    Period Weeks    Status New    Target Date 07/22/20      PT LONG TERM GOAL #4   Title Increase left ankle strength grossly 1/2 MMT grade to improve ankle stability for outdoor ambulation over uneven surfaces    Baseline 4+/5 except ankle DF 4/5, ankle PF tested in supine    Time 6    Period Weeks    Status New    Target Date 07/22/20      PT LONG TERM GOAL #5   Title Tolerate standing and ambulation for chores and shopping periods at least 30-40 min with ankle/heel pain 2/10 or less    Baseline No pain at rest at eval but intermittent higher pain with prolonged activity    Time 6    Period Weeks    Status New    Target Date 07/22/20                 Plan - 06/22/20 1136    Clinical Impression Statement Pt. returned for first follow up session since eval and limited HEP performance. Focused on strengthening and balance as noted per flowsheet-diffuse weakness noted and with notable lack of proximal hip stability for hip abduction and step up motions. Regarding pt. concerns as noted in  subjective pt. did have significant injury for left Achilles so would suspect gait difficulties associated with s/p injury and subsequent sedentary status during recovery but discussed if issues persist despite therapy and if having any neurological symptoms  recommend MD follow up for further assessment otherwise given symptom/injury etiology and pt. age expect progress will be gradual and take consistent exercise. Session today was well-tolerated without any pain noted.    Personal Factors and Comorbidities Comorbidity 2    Comorbidities oestoporosis, bilat. TKA    Examination-Activity Limitations Stand;Stairs;Locomotion Level;Lift;Transfers;Carry;Squat    Examination-Participation Restrictions Shop;Meal Prep;Community Activity;Laundry;Cleaning    Stability/Clinical Decision Making Stable/Uncomplicated    Clinical Decision Making Low    Rehab Potential Good    PT Frequency 2x / week    PT Duration 6 weeks    PT Treatment/Interventions ADLs/Self Care Home Management;Cryotherapy;Electrical Stimulation;Moist Heat;Gait training;Stair training;Functional mobility training;Therapeutic activities;Neuromuscular re-education;Therapeutic exercise;Patient/family education;Manual techniques;Passive range of motion;Vasopneumatic Device;Dry needling;Taping;Balance training    PT Next Visit Plan Continue open and closed chain LE strengthening as tolerated, balance/proprioceptove challenges, ankle ROM, proximal hip stability, update HEP as needed with standing strengthening and balance    PT Home Exercise Plan Access code: [email or web address]    Consulted and Agree with Plan of Care Patient           Patient will benefit from skilled therapeutic intervention in order to improve the following deficits and impairments:  Pain,Impaired flexibility,Decreased strength,Decreased activity tolerance,Decreased range of motion,Difficulty walking,Decreased balance,Abnormal gait  Visit Diagnosis: Pain in left ankle and joints of left foot  Stiffness of left ankle, not elsewhere classified  Localized edema  Difficulty in walking, not elsewhere classified  Muscle weakness (generalized)     Problem List Patient Active Problem List   Diagnosis Date Noted  . Nuclear sclerotic  cataract of left eye 02/25/2020  . 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  . Exudative age-related macular degeneration of right eye with active choroidal neovascularization ([place]) 09/24/2019  . Choroidal neovascularization of right eye 09/24/2019  . Retinal hemorrhage of right eye 09/24/2019  . Retinal exudates and deposits 09/24/2019  . Cystoid macular edema of right eye 09/24/2019  . 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  . Mastodynia, female 03/19/2016  . Cystocele 02/08/2014  . Vaginal atrophy 01/28/2014  . History of herpes simplex infection 01/28/2014  . Knee pain, acute 03/05/2013  . Anxiety 02/05/2013  . Constipation 02/05/2013  . Depression 02/05/2013  . Psoriasis 02/05/2013  . Failed total knee arthroplasty ([place]) 01/23/2013  . Sciatic pain   . Detrusor instability   . Uterine prolapse   . Atrophic vaginitis   . Osteoporosis     Beaulah Dinning, PT, DPT 06/22/20 11:43 AM  [facility] [address] [place], [place], [address] Phone: [phone]   Fax:  [phone]  Name: [name] MRN: [id number] Date of Birth: 1941-03-30

## 2020-06-23 ENCOUNTER — Encounter: Payer: Self-pay | Admitting: Physical Therapy

## 2020-06-23 ENCOUNTER — Ambulatory Visit: Payer: Medicare PPO | Admitting: Physical Therapy

## 2020-06-23 DIAGNOSIS — M25672 Stiffness of left ankle, not elsewhere classified: Secondary | ICD-10-CM

## 2020-06-23 DIAGNOSIS — M25572 Pain in left ankle and joints of left foot: Secondary | ICD-10-CM

## 2020-06-23 DIAGNOSIS — R600 Localized edema: Secondary | ICD-10-CM

## 2020-06-23 DIAGNOSIS — R262 Difficulty in walking, not elsewhere classified: Secondary | ICD-10-CM | POA: Diagnosis not present

## 2020-06-23 DIAGNOSIS — M6281 Muscle weakness (generalized): Secondary | ICD-10-CM

## 2020-06-23 NOTE — Therapy (Signed)
[facility] Outpatient Rehabilitation [facility] [address] [place], [place], [address] Phone: [phone]   Fax:  [phone]  Physical Therapy Treatment  Patient Details  Name: [name] MRN: [id number] Date of Birth: 04-03-1941 Referring Provider (PT): Karren Burly, DPM   Encounter Date: 06/23/2020   PT End of Session - 06/23/20 1200    Visit Number 3    Number of Visits 12    Date for PT Re-Evaluation 07/22/20    Authorization Type [employer] MCR-pending authorization    PT Start Time 1017    PT Stop Time 1100    PT Time Calculation (min) 43 min    Activity Tolerance Patient tolerated treatment well    Behavior During Therapy [facility] for tasks assessed/performed           Past Medical History:  Diagnosis Date  . Anxiety   . Arthritis   . Atrophic vaginitis   . Detrusor instability   . GERD (gastroesophageal reflux disease)   . Osteoporosis   . Psoriasis   . Sciatic pain   . Uterine prolapse     Past Surgical History:  Procedure Laterality Date  . APPENDECTOMY    . FOOT SURGERY    . KNEE SURGERY    . TOTAL KNEE ARTHROPLASTY Bilateral    X2-- 2009; 2010  . TOTAL KNEE REVISION Left 01/23/2013   Procedure:  TOTAL KNEE ARTHROPLASTY REVISION ;  Surgeon: Loanne Drilling, MD;  Location: [facility];  Service: Orthopedics;  Laterality: Left;    There were no vitals filed for this visit.   Subjective Assessment - 06/23/20 1039    Subjective Some soreness after exercises yesterday otherwise no new complaints/concerns this AM.                             [facility] Adult PT Treatment/Exercise - 06/23/20 0001      Knee/Hip Exercises: Standing   Hip Abduction AROM;Stengthening;Both;15 reps;Knee straight    Forward Step Up Left;Right;Hand Hold: 2;Step Height: 4";15 reps    Functional Squat 15 reps    Functional Squat Limitations partial squat at counter with bilat. UE support      Knee/Hip Exercises: Seated   Long Arc Quad  AROM;Strengthening;Left;2 sets;10 reps    Long Arc Quad Weight 4 lbs.    Clamshell with TheraBand Red   2x10     Knee/Hip Exercises: Supine   Short Arc Quad Sets AROM;Strengthening;Left;2 sets;10 reps    Short Arc Quad Sets Limitations 3 lbs.    Bridges Both;AROM;Strengthening;15 reps    Bridges Limitations glut set to partial hip bridge    Straight Leg Raises Limitations attempted but switched to SAQ due to difficulty      Manual Therapy   Manual Therapy Soft tissue mobilization    Joint Mobilization grade I-III left ankle talocrural AP mobilization    Soft tissue mobilization STM left medial gastrocnemius      Ankle Exercises: Standing   Rocker Board 1 minute    Rocker Board Limitations dynamic lateral balance    Heel Raises 20 reps    Heel Raises Limitations with right weightshift    Other Standing Ankle Exercises Romberg foam eyes open 20 sec x 3 with intermittent hand support on counter, Airex marches x 20 reps      Ankle Exercises: Supine   T-Band Theraband ankle 4-way red band 2x10 ea.      Ankle Exercises: Seated   BAPS Level  2;Sitting;15 reps    Other Seated Ankle Exercises seated left ankle PF/DF and EV/IV with foot on tilt board x 20 ea.                  PT Education - 06/23/20 1200    Education Details HEP updates    Person(s) Educated Patient    Methods Explanation;Demonstration;Verbal cues;Handout    Comprehension Returned demonstration;Verbalized understanding            PT Short Term Goals - 06/03/20 0817      PT SHORT TERM GOAL #1   Title Independent with HEP    Baseline needs HEP    Time 3    Period Weeks    Status New      PT SHORT TERM GOAL #2   Title Improve left ankle DF AROM 3-5 deg to improve toe clearance with gait    Baseline 0 deg    Time 3    Period Weeks    Status New             PT Long Term Goals - 06/03/20 0818      PT LONG TERM GOAL #1   Title Improve FOTO outcome measure score to 39% or less impairment     Baseline 60% limited    Time 6    Period Weeks    Status New    Target Date 07/22/20      PT LONG TERM GOAL #2   Title Improve Berg Balance score t0 40/56 or greater to work towards decreased fall risk    Baseline 32/56    Time 6    Period Weeks    Status New    Target Date 07/22/20      PT LONG TERM GOAL #3   Title Pt. to be able to perform community level ambulation independently without AD    Baseline mod I with SPC    Time 6    Period Weeks    Status New    Target Date 07/22/20      PT LONG TERM GOAL #4   Title Increase left ankle strength grossly 1/2 MMT grade to improve ankle stability for outdoor ambulation over uneven surfaces    Baseline 4+/5 except ankle DF 4/5, ankle PF tested in supine    Time 6    Period Weeks    Status New    Target Date 07/22/20      PT LONG TERM GOAL #5   Title Tolerate standing and ambulation for chores and shopping periods at least 30-40 min with ankle/heel pain 2/10 or less    Baseline No pain at rest at eval but intermittent higher pain with prolonged activity    Time 6    Period Weeks    Status New    Target Date 07/22/20                 Plan - 06/23/20 1200    Clinical Impression Statement Continued previous tx. emphasis with slight increase in repetitions from yesterday's session for closed chain strengthening in standing. Less compensatory trunk lean noted with hip abduction but still with diffuse ankle as well as knee and hip weakness. Updated HEP with partial squats and step ups for functional strengthening as home. Pt. challenged in particular with static and dynamic balance on compliant surfaces with decreased proprioception. Plan continue PT for further progress to address functional limitations for mobility and work towards decreased fall risk.    Personal  Factors and Comorbidities Comorbidity 2    Comorbidities oestoporosis, bilat. TKA    Examination-Activity Limitations Stand;Stairs;Locomotion  Level;Lift;Transfers;Carry;Squat    Examination-Participation Restrictions Shop;Meal Prep;Community Activity;Laundry;Cleaning    Stability/Clinical Decision Making Stable/Uncomplicated    Clinical Decision Making Low    Rehab Potential Good    PT Frequency 2x / week    PT Duration 6 weeks    PT Treatment/Interventions ADLs/Self Care Home Management;Cryotherapy;Electrical Stimulation;Moist Heat;Gait training;Stair training;Functional mobility training;Therapeutic activities;Neuromuscular re-education;Therapeutic exercise;Patient/family education;Manual techniques;Passive range of motion;Vasopneumatic Device;Dry needling;Taping;Balance training    PT Next Visit Plan Continue open and closed chain LE strengthening as tolerated, balance/proprioceptove challenges, ankle ROM, proximal hip stability    PT Home Exercise Plan Access code: [facility]           Patient will benefit from skilled therapeutic intervention in order to improve the following deficits and impairments:  Pain,Impaired flexibility,Decreased strength,Decreased activity tolerance,Decreased range of motion,Difficulty walking,Decreased balance,Abnormal gait  Visit Diagnosis: Pain in left ankle and joints of left foot  Stiffness of left ankle, not elsewhere classified  Localized edema  Difficulty in walking, not elsewhere classified  Muscle weakness (generalized)     Problem List Patient Active Problem List   Diagnosis Date Noted  . Nuclear sclerotic cataract of left eye 02/25/2020  . 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  . Exudative age-related macular degeneration of right eye with active choroidal neovascularization ([place]) 09/24/2019  . Choroidal neovascularization of right eye 09/24/2019  . Retinal hemorrhage of right eye 09/24/2019  . Retinal exudates and deposits 09/24/2019  . Cystoid macular edema of right eye 09/24/2019  . 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  . Mastodynia, female  03/19/2016  . Cystocele 02/08/2014  . Vaginal atrophy 01/28/2014  . History of herpes simplex infection 01/28/2014  . Knee pain, acute 03/05/2013  . Anxiety 02/05/2013  . Constipation 02/05/2013  . Depression 02/05/2013  . Psoriasis 02/05/2013  . Failed total knee arthroplasty ([place]) 01/23/2013  . Sciatic pain   . Detrusor instability   . Uterine prolapse   . Atrophic vaginitis   . Osteoporosis     Beaulah Dinning, PT, DPT 06/23/20 12:05 PM  [facility] [address] [place], [place], [address] Phone: [phone]   Fax:  [phone]  Name: HUMA IMHOFF MRN: [id number] Date of Birth: 12/10/1940

## 2020-06-28 ENCOUNTER — Encounter: Payer: Self-pay | Admitting: Physical Therapy

## 2020-06-28 ENCOUNTER — Other Ambulatory Visit: Payer: Self-pay

## 2020-06-28 ENCOUNTER — Ambulatory Visit: Payer: Medicare PPO | Admitting: Physical Therapy

## 2020-06-28 DIAGNOSIS — M25572 Pain in left ankle and joints of left foot: Secondary | ICD-10-CM | POA: Diagnosis not present

## 2020-06-28 DIAGNOSIS — R262 Difficulty in walking, not elsewhere classified: Secondary | ICD-10-CM | POA: Diagnosis not present

## 2020-06-28 DIAGNOSIS — M6281 Muscle weakness (generalized): Secondary | ICD-10-CM

## 2020-06-28 DIAGNOSIS — R600 Localized edema: Secondary | ICD-10-CM | POA: Diagnosis not present

## 2020-06-28 DIAGNOSIS — M25672 Stiffness of left ankle, not elsewhere classified: Secondary | ICD-10-CM

## 2020-06-28 NOTE — Therapy (Signed)
[facility] Outpatient Rehabilitation [facility] [address] [place], [place], [address] Phone: [phone]   Fax:  [phone]  Physical Therapy Treatment  Patient Details  Name: [name] MRN: [id number] Date of Birth: 1940/08/22 Referring Provider (PT): Karren Burly, DPM   Encounter Date: 06/28/2020   PT End of Session - 06/28/20 1337    Visit Number 4    Number of Visits 12    Date for PT Re-Evaluation 07/22/20    Authorization Type [employer] MCR-pending authorization    PT Start Time 1334    PT Stop Time 1412    PT Time Calculation (min) 38 min    Activity Tolerance Patient tolerated treatment well    Behavior During Therapy [facility] for tasks assessed/performed           Past Medical History:  Diagnosis Date  . Anxiety   . Arthritis   . Atrophic vaginitis   . Detrusor instability   . GERD (gastroesophageal reflux disease)   . Osteoporosis   . Psoriasis   . Sciatic pain   . Uterine prolapse     Past Surgical History:  Procedure Laterality Date  . APPENDECTOMY    . FOOT SURGERY    . KNEE SURGERY    . TOTAL KNEE ARTHROPLASTY Bilateral    X2-- 2009; 2010  . TOTAL KNEE REVISION Left 01/23/2013   Procedure:  TOTAL KNEE ARTHROPLASTY REVISION ;  Surgeon: Loanne Drilling, MD;  Location: [facility];  Service: Orthopedics;  Laterality: Left;    There were no vitals filed for this visit.   Subjective Assessment - 06/28/20 1336    Subjective Pt. arrives today ambulating without cane but reports still using cane for outdoor/community mobility and intermittently at home. No pain pre-tx.    Currently in Pain? No/denies                             [facility] Adult PT Treatment/Exercise - 06/28/20 0001      Neuro Re-ed    Neuro Re-ed Details  stepping out of base of support with 3 way vector steps with left foot on Airex x 12 reps, Romberg foam eyes closed 10-20 sec x 3, tandem stance on Airex 10 sec x 3 ea. bilat.   right hand use for  counter support with SBA for vector steps     Knee/Hip Exercises: Standing   Hip Abduction AROM;Stengthening;Left;2 sets;10 reps    Lateral Step Up Left;2 sets;10 reps;Hand Hold: 2;Step Height: 4"    Forward Step Up Left;2 sets;10 reps;Hand Hold: 2;Step Height: 6"    Functional Squat Limitations "touch and go" squat to incline wedge on high low table x 15 reps, cues for forward lean for momentum to assist      Ankle Exercises: Aerobic   Nustep L4 x 5 min UE/LE      Ankle Exercises: Standing   Rocker Board 2 minutes    Rocker Board Limitations dynamic lateral and fwrev  balance    Heel Raises 20 reps    Heel Raises Limitations 50/50 weight distribution      Ankle Exercises: Stretches   Gastroc Stretch 3 reps;30 seconds    Gastroc Stretch Limitations supine manual stretch left side      Ankle Exercises: Supine   T-Band Theraband ankle 4-way red band 2x10 ea.                  PT Education - 06/28/20  1430    Education Details continue use SPC with ambulation for safety    Person(s) Educated Patient    Methods Explanation    Comprehension Verbalized understanding            PT Short Term Goals - 06/03/20 0817      PT SHORT TERM GOAL #1   Title Independent with HEP    Baseline needs HEP    Time 3    Period Weeks    Status New      PT SHORT TERM GOAL #2   Title Improve left ankle DF AROM 3-5 deg to improve toe clearance with gait    Baseline 0 deg    Time 3    Period Weeks    Status New             PT Long Term Goals - 06/03/20 0818      PT LONG TERM GOAL #1   Title Improve FOTO outcome measure score to 39% or less impairment    Baseline 60% limited    Time 6    Period Weeks    Status New    Target Date 07/22/20      PT LONG TERM GOAL #2   Title Improve Berg Balance score t0 40/56 or greater to work towards decreased fall risk    Baseline 32/56    Time 6    Period Weeks    Status New    Target Date 07/22/20      PT LONG TERM GOAL #3   Title  Pt. to be able to perform community level ambulation independently without AD    Baseline mod I with SPC    Time 6    Period Weeks    Status New    Target Date 07/22/20      PT LONG TERM GOAL #4   Title Increase left ankle strength grossly 1/2 MMT grade to improve ankle stability for outdoor ambulation over uneven surfaces    Baseline 4+/5 except ankle DF 4/5, ankle PF tested in supine    Time 6    Period Weeks    Status New    Target Date 07/22/20      PT LONG TERM GOAL #5   Title Tolerate standing and ambulation for chores and shopping periods at least 30-40 min with ankle/heel pain 2/10 or less    Baseline No pain at rest at eval but intermittent higher pain with prolonged activity    Time 6    Period Weeks    Status New    Target Date 07/22/20                 Plan - 06/28/20 1427    Clinical Impression Statement Pt. showing some improvements with strength from previous status with ability to progress from 4 to 6 in. step ups as well as heel raises with 50/50 weight distribution (previously more right weightshift). As noted in subjective pt. arrived without cane but she was unsteady and required UE support on clinic wall and counter between exercises so provided with [facility] to borrow to assist after session to waiting area and car as needed.    Personal Factors and Comorbidities Comorbidity 2    Comorbidities oestoporosis, bilat. TKA    Examination-Activity Limitations Stand;Stairs;Locomotion Level;Lift;Transfers;Carry;Squat    Examination-Participation Restrictions Shop;Meal Prep;Community Activity;Laundry;Cleaning    Stability/Clinical Decision Making Stable/Uncomplicated    Clinical Decision Making Low    Rehab Potential Good    PT Frequency  2x / week    PT Duration 6 weeks    PT Treatment/Interventions ADLs/Self Care Home Management;Cryotherapy;Electrical Stimulation;Moist Heat;Gait training;Stair training;Functional mobility training;Therapeutic  activities;Neuromuscular re-education;Therapeutic exercise;Patient/family education;Manual techniques;Passive range of motion;Vasopneumatic Device;Dry needling;Taping;Balance training    PT Next Visit Plan Continue open and closed chain LE strengthening as tolerated, balance/proprioceptove challenges, ankle ROM, proximal hip stability    PT Home Exercise Plan Access code: [email or web address]    Consulted and Agree with Plan of Care Patient           Patient will benefit from skilled therapeutic intervention in order to improve the following deficits and impairments:  Pain,Impaired flexibility,Decreased strength,Decreased activity tolerance,Decreased range of motion,Difficulty walking,Decreased balance,Abnormal gait  Visit Diagnosis: Pain in left ankle and joints of left foot  Stiffness of left ankle, not elsewhere classified  Localized edema  Difficulty in walking, not elsewhere classified  Muscle weakness (generalized)     Problem List Patient Active Problem List   Diagnosis Date Noted  . Nuclear sclerotic cataract of left eye 02/25/2020  . 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  . Exudative age-related macular degeneration of right eye with active choroidal neovascularization (HCC) 09/24/2019  . Choroidal neovascularization of right eye 09/24/2019  . Retinal hemorrhage of right eye 09/24/2019  . Retinal exudates and deposits 09/24/2019  . Cystoid macular edema of right eye 09/24/2019  . 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  . Mastodynia, female 03/19/2016  . Cystocele 02/08/2014  . Vaginal atrophy 01/28/2014  . History of herpes simplex infection 01/28/2014  . Knee pain, acute 03/05/2013  . Anxiety 02/05/2013  . Constipation 02/05/2013  . Depression 02/05/2013  . Psoriasis 02/05/2013  . Failed total knee arthroplasty (HCC) 01/23/2013  . Sciatic pain   . Detrusor instability   . Uterine prolapse   . Atrophic vaginitis   . Osteoporosis      Lazarus Gowda, PT, DPT 06/28/20 2:32 PM  [facility] [address] [place], [place], [address] Phone: [phone]   Fax:  [phone]  Name: JAMINA MACBETH MRN: [id number] Date of Birth: [birth year]

## 2020-06-30 ENCOUNTER — Encounter: Payer: Self-pay | Admitting: Physical Therapy

## 2020-06-30 ENCOUNTER — Other Ambulatory Visit: Payer: Self-pay

## 2020-06-30 ENCOUNTER — Ambulatory Visit: Payer: Medicare PPO | Admitting: Physical Therapy

## 2020-06-30 DIAGNOSIS — M25572 Pain in left ankle and joints of left foot: Secondary | ICD-10-CM | POA: Diagnosis not present

## 2020-06-30 DIAGNOSIS — M25672 Stiffness of left ankle, not elsewhere classified: Secondary | ICD-10-CM

## 2020-06-30 DIAGNOSIS — M6281 Muscle weakness (generalized): Secondary | ICD-10-CM

## 2020-06-30 DIAGNOSIS — R262 Difficulty in walking, not elsewhere classified: Secondary | ICD-10-CM

## 2020-06-30 DIAGNOSIS — R600 Localized edema: Secondary | ICD-10-CM

## 2020-06-30 NOTE — Therapy (Signed)
[facility] Outpatient Rehabilitation [facility] [address] [place], [place], [address] Phone: [phone]   Fax:  [phone]  Physical Therapy Treatment  Patient Details  Name: [name] MRN: [id number] Date of Birth: [birth year] Referring Provider (PT): Karren Burly, DPM   Encounter Date: 06/30/2020   PT End of Session - 06/30/20 1027    Visit Number 5    Number of Visits 12    Date for PT Re-Evaluation 07/22/20    Authorization Type [employer] MCR    Authorization Time Period 06/03/20-07/29/20    Authorization - Visit Number 5    Authorization - Number of Visits 12    PT Start Time 1025   pt. arrived late   PT Stop Time 1100    PT Time Calculation (min) 35 min    Activity Tolerance Patient tolerated treatment well    Behavior During Therapy [facility] for tasks assessed/performed           Past Medical History:  Diagnosis Date  . Anxiety   . Arthritis   . Atrophic vaginitis   . Detrusor instability   . GERD (gastroesophageal reflux disease)   . Osteoporosis   . Psoriasis   . Sciatic pain   . Uterine prolapse     Past Surgical History:  Procedure Laterality Date  . APPENDECTOMY    . FOOT SURGERY    . KNEE SURGERY    . TOTAL KNEE ARTHROPLASTY Bilateral    X2-- 2009; 2010  . TOTAL KNEE REVISION Left 01/23/2013   Procedure:  TOTAL KNEE ARTHROPLASTY REVISION ;  Surgeon: Loanne Drilling, MD;  Location: [facility];  Service: Orthopedics;  Laterality: Left;    There were no vitals filed for this visit.   Subjective Assessment - 06/30/20 1028    Subjective Pt. using SPC again as advised last session. No pain pre-tx.    Pertinent History osteoporosis, bilateral TKA    Limitations Standing;Walking;House hold activities;Lifting    Diagnostic tests [place]    Patient Stated Goals Be able to walk without cane and walk outside for exercises    Currently in Pain? No/denies                             [facility] Adult PT Treatment/Exercise -  06/30/20 0001      Neuro Re-ed    Neuro Re-ed Details  Airex marches x 20, partial tandem stance 30 sec ea. bilat. both directions intermittent UE support on counter, SLS with counter support 10-20 sec x 2 ea. bilat., toe taps x 20 to 6 in. step with CGA      Knee/Hip Exercises: Machines for Strengthening   Cybex Leg Press 45 lbs. 2x10      Knee/Hip Exercises: Standing   Hip Abduction AROM;Stengthening;Left;2 sets;10 reps    Lateral Step Up Right;Left;2 sets;10 reps;Hand Hold: 2;Step Height: 4"    Forward Step Up Right;Left;2 sets;10 reps;Hand Hold: 2;Step Height: 6"      Ankle Exercises: Aerobic   Nustep L4 x 5 min UE/LE      Ankle Exercises: Standing   Rocker Board 2 minutes    Rocker Board Limitations dynamic lateral and fwrev  balance    Heel Raises 20 reps    Heel Raises Limitations 50/50 weight distribution                    PT Short Term Goals - 06/03/20 9242  PT SHORT TERM GOAL #1   Title Independent with HEP    Baseline needs HEP    Time 3    Period Weeks    Status New      PT SHORT TERM GOAL #2   Title Improve left ankle DF AROM 3-5 deg to improve toe clearance with gait    Baseline 0 deg    Time 3    Period Weeks    Status New             PT Long Term Goals - 06/03/20 0818      PT LONG TERM GOAL #1   Title Improve FOTO outcome measure score to 39% or less impairment    Baseline 60% limited    Time 6    Period Weeks    Status New    Target Date 07/22/20      PT LONG TERM GOAL #2   Title Improve Berg Balance score t0 40/56 or greater to work towards decreased fall risk    Baseline 32/56    Time 6    Period Weeks    Status New    Target Date 07/22/20      PT LONG TERM GOAL #3   Title Pt. to be able to perform community level ambulation independently without AD    Baseline mod I with SPC    Time 6    Period Weeks    Status New    Target Date 07/22/20      PT LONG TERM GOAL #4   Title Increase left ankle strength grossly 1/2  MMT grade to improve ankle stability for outdoor ambulation over uneven surfaces    Baseline 4+/5 except ankle DF 4/5, ankle PF tested in supine    Time 6    Period Weeks    Status New    Target Date 07/22/20      PT LONG TERM GOAL #5   Title Tolerate standing and ambulation for chores and shopping periods at least 30-40 min with ankle/heel pain 2/10 or less    Baseline No pain at rest at eval but intermittent higher pain with prolonged activity    Time 6    Period Weeks    Status New    Target Date 07/22/20                 Plan - 06/30/20 1156    Clinical Impression Statement Continued focus on balance, left ankle and general LE strengthening. Gradually showing improvements with strength but still with increased fall risk and gait difficulties so plan continue PT to address/improve safety with mobility.    Personal Factors and Comorbidities Comorbidity 2    Comorbidities osteoporosis, bilat. TKA    Examination-Activity Limitations Stand;Stairs;Locomotion Level;Lift;Transfers;Carry;Squat    Examination-Participation Restrictions Shop;Meal Prep;Community Activity;Laundry;Cleaning    Stability/Clinical Decision Making Stable/Uncomplicated    Clinical Decision Making Low    Rehab Potential Good    PT Frequency 2x / week    PT Duration 6 weeks    PT Treatment/Interventions ADLs/Self Care Home Management;Cryotherapy;Electrical Stimulation;Moist Heat;Gait training;Stair training;Functional mobility training;Therapeutic activities;Neuromuscular re-education;Therapeutic exercise;Patient/family education;Manual techniques;Passive range of motion;Vasopneumatic Device;Dry needling;Taping;Balance training    PT Next Visit Plan Recheck FOTO, Continue open and closed chain LE strengthening as tolerated, balance/proprioceptove challenges, ankle ROM, proximal hip stability    PT Home Exercise Plan Access code: [email or web address]    Consulted and Agree with Plan of Care Patient           Patient  will benefit from skilled therapeutic intervention in order to improve the following deficits and impairments:  Pain,Impaired flexibility,Decreased strength,Decreased activity tolerance,Decreased range of motion,Difficulty walking,Decreased balance,Abnormal gait  Visit Diagnosis: Pain in left ankle and joints of left foot  Stiffness of left ankle, not elsewhere classified  Localized edema  Difficulty in walking, not elsewhere classified  Muscle weakness (generalized)     Problem List Patient Active Problem List   Diagnosis Date Noted  . Nuclear sclerotic cataract of left eye 02/25/2020  . 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  . Exudative age-related macular degeneration of right eye with active choroidal neovascularization (HCC) 09/24/2019  . Choroidal neovascularization of right eye 09/24/2019  . Retinal hemorrhage of right eye 09/24/2019  . Retinal exudates and deposits 09/24/2019  . Cystoid macular edema of right eye 09/24/2019  . 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  . Mastodynia, female 03/19/2016  . Cystocele 02/08/2014  . Vaginal atrophy 01/28/2014  . History of herpes simplex infection 01/28/2014  . Knee pain, acute 03/05/2013  . Anxiety 02/05/2013  . Constipation 02/05/2013  . Depression 02/05/2013  . Psoriasis 02/05/2013  . Failed total knee arthroplasty (HCC) 01/23/2013  . Sciatic pain   . Detrusor instability   . Uterine prolapse   . Atrophic vaginitis   . Osteoporosis     Lazarus Gowda, PT, DPT 06/30/20 11:59 AM  [facility] [address] [place], [place], [address] Phone: [phone]   Fax:  [phone]  Name: OLUWADARASIMI FAVOR MRN: [id number] Date of Birth: 10/17/1940

## 2020-07-05 ENCOUNTER — Ambulatory Visit: Payer: Medicare PPO | Admitting: Physical Therapy

## 2020-07-07 ENCOUNTER — Encounter: Payer: Self-pay | Admitting: Physical Therapy

## 2020-07-07 ENCOUNTER — Ambulatory Visit: Payer: Medicare PPO | Admitting: Physical Therapy

## 2020-07-07 ENCOUNTER — Other Ambulatory Visit: Payer: Self-pay

## 2020-07-07 DIAGNOSIS — M25672 Stiffness of left ankle, not elsewhere classified: Secondary | ICD-10-CM

## 2020-07-07 DIAGNOSIS — R262 Difficulty in walking, not elsewhere classified: Secondary | ICD-10-CM

## 2020-07-07 DIAGNOSIS — R600 Localized edema: Secondary | ICD-10-CM | POA: Diagnosis not present

## 2020-07-07 DIAGNOSIS — M6281 Muscle weakness (generalized): Secondary | ICD-10-CM | POA: Diagnosis not present

## 2020-07-07 DIAGNOSIS — M25572 Pain in left ankle and joints of left foot: Secondary | ICD-10-CM | POA: Diagnosis not present

## 2020-07-07 NOTE — Therapy (Signed)
[facility] Outpatient Rehabilitation [facility] [address] [place], [place], [address] Phone: [phone]   Fax:  [phone]  Physical Therapy Treatment  Patient Details  Name: [name] MRN: [id number] Date of Birth: 25-Apr-1941 Referring Provider (PT): Karren Burly, DPM   Encounter Date: 07/07/2020   PT End of Session - 07/07/20 1024    Visit Number 6    Number of Visits 12    Date for PT Re-Evaluation 07/22/20    Authorization Type [employer] MCR    Authorization Time Period 06/03/20-07/29/20    Authorization - Visit Number 6    Authorization - Number of Visits 12    PT Start Time 1018    PT Stop Time 1100   Pt. arrived as scheduled but had not checked in so delay in session from time spent with check in so treatment session time limited to 34 minutes direct minutes   PT Time Calculation (min) 42 min    Activity Tolerance Patient tolerated treatment well    Behavior During Therapy [facility] for tasks assessed/performed           Past Medical History:  Diagnosis Date  . Anxiety   . Arthritis   . Atrophic vaginitis   . Detrusor instability   . GERD (gastroesophageal reflux disease)   . Osteoporosis   . Psoriasis   . Sciatic pain   . Uterine prolapse     Past Surgical History:  Procedure Laterality Date  . APPENDECTOMY    . FOOT SURGERY    . KNEE SURGERY    . TOTAL KNEE ARTHROPLASTY Bilateral    X2-- 2009; 2010  . TOTAL KNEE REVISION Left 01/23/2013   Procedure:  TOTAL KNEE ARTHROPLASTY REVISION ;  Surgeon: Loanne Drilling, MD;  Location: [facility];  Service: Orthopedics;  Laterality: Left;    There were no vitals filed for this visit.   Subjective Assessment - 07/07/20 1022    Subjective Pt. had to cancel last visit due to weather with slippery driveway from ice/snow. "Maybe a little" stronger and has been working on HEP otherwise no pain and no new complaints/concerns this AM.    Pertinent History osteoporosis, bilateral TKA    Limitations  Standing;Walking;House hold activities;Lifting    Diagnostic tests [place]    Patient Stated Goals Be able to walk without cane and walk outside for exercises    Currently in Pain? No/denies                             [facility] Adult PT Treatment/Exercise - 07/07/20 0001      Neuro Re-ed    Neuro Re-ed Details  Airex marches x 20, Romberg eyes closed 20 sec x 3 with CGA on Airex with feet together      Knee/Hip Exercises: Machines for Strengthening   Cybex Leg Press 55 lbs. 2x10      Knee/Hip Exercises: Standing   Hip Abduction AROM;Stengthening;Left;2 sets;10 reps    Abduction Limitations cues to keep AROM in frontal plane due to tendency compensatory hip flexion    Lateral Step Up Right;Left;15 reps;Hand Hold: 2;Step Height: 6"    Forward Step Up Right;Left;15 reps;Hand Hold: 2;Step Height: 8"    Functional Squat 2 sets;10 reps    Functional Squat Limitations squat at counter with bilat. UE support      Ankle Exercises: Aerobic   Nustep L5 x 5 min LE only      Ankle Exercises:  Standing   Rocker Board 2 minutes    Rocker Board Limitations dynamic lateral and fwrev  balance    Heel Raises Both;20 reps   2x10 on Airex   Other Standing Ankle Exercises see under neuromuscular re-education                  PT Education - 07/07/20 1218    Education Details exercises, HEP-review squat at counter vs. sit<>stand from edge of bed or chair and step up    Person(s) Educated Patient    Methods Explanation;Demonstration    Comprehension Verbalized understanding;Returned demonstration            PT Short Term Goals - 06/03/20 0817      PT SHORT TERM GOAL #1   Title Independent with HEP    Baseline needs HEP    Time 3    Period Weeks    Status New      PT SHORT TERM GOAL #2   Title Improve left ankle DF AROM 3-5 deg to improve toe clearance with gait    Baseline 0 deg    Time 3    Period Weeks    Status New             PT Long Term Goals - 06/03/20  0818      PT LONG TERM GOAL #1   Title Improve FOTO outcome measure score to 39% or less impairment    Baseline 60% limited    Time 6    Period Weeks    Status New    Target Date 07/22/20      PT LONG TERM GOAL #2   Title Improve Berg Balance score t0 40/56 or greater to work towards decreased fall risk    Baseline 32/56    Time 6    Period Weeks    Status New    Target Date 07/22/20      PT LONG TERM GOAL #3   Title Pt. to be able to perform community level ambulation independently without AD    Baseline mod I with SPC    Time 6    Period Weeks    Status New    Target Date 07/22/20      PT LONG TERM GOAL #4   Title Increase left ankle strength grossly 1/2 MMT grade to improve ankle stability for outdoor ambulation over uneven surfaces    Baseline 4+/5 except ankle DF 4/5, ankle PF tested in supine    Time 6    Period Weeks    Status New    Target Date 07/22/20      PT LONG TERM GOAL #5   Title Tolerate standing and ambulation for chores and shopping periods at least 30-40 min with ankle/heel pain 2/10 or less    Baseline No pain at rest at eval but intermittent higher pain with prolonged activity    Time 6    Period Weeks    Status New    Target Date 07/22/20                 Plan - 07/07/20 1220    Clinical Impression Statement Able to progress step up height to 8 inches for front step up and 6 inches for side step up-patient challenged with progression due to weakness but able to complete reps as noted without pain. She still has difficulty in particular with sit<>stand from low chairs so continued emphasis strengthening to address in addition to work on  balance. need for PT ongoing to immprove functional status and safety for mobility.    Personal Factors and Comorbidities Comorbidity 2    Comorbidities osteoporosis, bilat. TKA    Examination-Activity Limitations Stand;Stairs;Locomotion Level;Lift;Transfers;Carry;Squat    Examination-Participation  Restrictions Shop;Meal Prep;Community Activity;Laundry;Cleaning    Stability/Clinical Decision Making Stable/Uncomplicated    Clinical Decision Making Low    Rehab Potential Good    PT Frequency 2x / week    PT Duration 6 weeks    PT Treatment/Interventions ADLs/Self Care Home Management;Cryotherapy;Electrical Stimulation;Moist Heat;Gait training;Stair training;Functional mobility training;Therapeutic activities;Neuromuscular re-education;Therapeutic exercise;Patient/family education;Manual techniques;Passive range of motion;Vasopneumatic Device;Dry needling;Taping;Balance training    PT Next Visit Plan Recheck FOTO (did not check 07/07/20 due to time constraints), Continue open and closed chain LE strengthening as tolerated, balance/proprioceptove challenges, ankle ROM, proximal hip stability    PT Home Exercise Plan Access code: [email or web address]    Consulted and Agree with Plan of Care Patient           Patient will benefit from skilled therapeutic intervention in order to improve the following deficits and impairments:  Pain,Impaired flexibility,Decreased strength,Decreased activity tolerance,Decreased range of motion,Difficulty walking,Decreased balance,Abnormal gait  Visit Diagnosis: Pain in left ankle and joints of left foot  Stiffness of left ankle, not elsewhere classified  Localized edema  Difficulty in walking, not elsewhere classified  Muscle weakness (generalized)     Problem List Patient Active Problem List   Diagnosis Date Noted  . Nuclear sclerotic cataract of left eye 02/25/2020  . 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  . Exudative age-related macular degeneration of right eye with active choroidal neovascularization (HCC) 09/24/2019  . Choroidal neovascularization of right eye 09/24/2019  . Retinal hemorrhage of right eye 09/24/2019  . Retinal exudates and deposits 09/24/2019  . Cystoid macular edema of right eye 09/24/2019  . Choroidal retinal  neovascularization 09/24/2019  . Mastodynia, female 03/19/2016  . Cystocele 02/08/2014  . Vaginal atrophy 01/28/2014  . History of herpes simplex infection 01/28/2014  . Knee pain, acute 03/05/2013  . Anxiety 02/05/2013  . Constipation 02/05/2013  . Depression 02/05/2013  . Psoriasis 02/05/2013  . Failed total knee arthroplasty (HCC) 01/23/2013  . Sciatic pain   . Detrusor instability   . Uterine prolapse   . Atrophic vaginitis   . Osteoporosis     Lazarus Gowda, PT, DPT 07/07/20 12:23 PM  [facility] Outpatient Rehabilitation [facility] [address] [place], [place], [address] Phone: [phone]   Fax:  [phone]  Name: [name] MRN: [id number] Date of Birth: 1941-05-08

## 2020-07-12 ENCOUNTER — Encounter: Payer: Self-pay | Admitting: Physical Therapy

## 2020-07-12 ENCOUNTER — Ambulatory Visit: Payer: Medicare PPO | Admitting: Physical Therapy

## 2020-07-12 ENCOUNTER — Other Ambulatory Visit: Payer: Self-pay

## 2020-07-12 DIAGNOSIS — R600 Localized edema: Secondary | ICD-10-CM

## 2020-07-12 DIAGNOSIS — R262 Difficulty in walking, not elsewhere classified: Secondary | ICD-10-CM | POA: Diagnosis not present

## 2020-07-12 DIAGNOSIS — M25572 Pain in left ankle and joints of left foot: Secondary | ICD-10-CM

## 2020-07-12 DIAGNOSIS — M25672 Stiffness of left ankle, not elsewhere classified: Secondary | ICD-10-CM

## 2020-07-12 DIAGNOSIS — M6281 Muscle weakness (generalized): Secondary | ICD-10-CM

## 2020-07-12 NOTE — Therapy (Signed)
[facility] [address] [place], [place], [address] Phone: [phone]   Fax:  [phone]  Physical Therapy Treatment  Patient Details  Name: [name] MRN: [id number] Date of Birth: [birth year] Referring Provider (PT): Karren Burly, DPM   Encounter Date: 07/12/2020   PT End of Session - 07/12/20 1339    Visit Number 7    Number of Visits 12    Date for PT Re-Evaluation 07/22/20    Authorization Type [employer] MCR    Authorization Time Period 06/03/20-07/29/20    Authorization - Visit Number 7    Authorization - Number of Visits 12    PT Start Time 1335    PT Stop Time 1414    PT Time Calculation (min) 39 min    Activity Tolerance Patient tolerated treatment well    Behavior During Therapy [facility] for tasks assessed/performed           Past Medical History:  Diagnosis Date  . Anxiety   . Arthritis   . Atrophic vaginitis   . Detrusor instability   . GERD (gastroesophageal reflux disease)   . Osteoporosis   . Psoriasis   . Sciatic pain   . Uterine prolapse     Past Surgical History:  Procedure Laterality Date  . APPENDECTOMY    . FOOT SURGERY    . KNEE SURGERY    . TOTAL KNEE ARTHROPLASTY Bilateral    X2-- 2009; 2010  . TOTAL KNEE REVISION Left 01/23/2013   Procedure:  TOTAL KNEE ARTHROPLASTY REVISION ;  Surgeon: Loanne Drilling, MD;  Location: [facility];  Service: Orthopedics;  Laterality: Left;    There were no vitals filed for this visit.   Subjective Assessment - 07/12/20 1341    Subjective "It's (slowly) getting better". Pt. still noting some stiffness in legs when first getting up in the AM or after prolonged sitting.    Pertinent History osteoporosis, bilateral TKA    Currently in Pain? No/denies              [facility] PT Assessment - 07/12/20 0001      Observation/Other Assessments   Focus on Therapeutic Outcomes (FOTO)  51% limited                         [facility] Adult PT  Treatment/Exercise - 07/12/20 0001      Neuro Re-ed    Neuro Re-ed Details  Romberg feet apart on Airex eyes closed 20 sec x 3 with CGA, "toe taps" to 6 in. step x 20 with CGA      Knee/Hip Exercises: Machines for Strengthening   Cybex Leg Press 55 lbs. 3x10      Knee/Hip Exercises: Standing   Hip Abduction AROM;Stengthening;Left;2 sets;10 reps    Lateral Step Up Right;Left;15 reps;Hand Hold: 2;Step Height: 6"    Forward Step Up Right;Left;15 reps;Hand Hold: 2;Step Height: 8"      Knee/Hip Exercises: Seated   Clamshell with TheraBand Green   x 20 reps   Sit to Sand 2 sets;10 reps;without UE support   to high low table set to 22 inch height     Ankle Exercises: Aerobic   Nustep L5 x 5 min LE only      Ankle Exercises: Stretches   Slant Board Stretch 3 reps;20 seconds      Ankle Exercises: Standing   Rocker Board 2 minutes    Rocker Board Limitations dynamic lateral and fwrev  balance    Heel Raises Both;20 reps   2x10 on Airex                   PT Short Term Goals - 06/03/20 0817      PT SHORT TERM GOAL #1   Title Independent with HEP    Baseline needs HEP    Time 3    Period Weeks    Status New      PT SHORT TERM GOAL #2   Title Improve left ankle DF AROM 3-5 deg to improve toe clearance with gait    Baseline 0 deg    Time 3    Period Weeks    Status New             PT Long Term Goals - 07/12/20 1341      PT LONG TERM GOAL #1   Title Improve FOTO outcome measure score to 39% or less impairment    Baseline 51% limited    Time 6    Period Weeks    Status On-going      PT LONG TERM GOAL #2   Title Improve Berg Balance score t0 40/56 or greater to work towards decreased fall risk    Baseline 32/56    Time 6    Period Weeks    Status On-going      PT LONG TERM GOAL #3   Title Pt. to be able to perform community level ambulation independently without AD    Baseline mod I with SPC    Time 6    Period Weeks    Status On-going      PT LONG  TERM GOAL #4   Title Increase left ankle strength grossly 1/2 MMT grade to improve ankle stability for outdoor ambulation over uneven surfaces    Baseline 4+/5 except ankle DF 4/5, ankle PF tested in supine    Time 6    Period Weeks    Status On-going      PT LONG TERM GOAL #5   Title Tolerate standing and ambulation for chores and shopping periods at least 30-40 min with ankle/heel pain 2/10 or less    Baseline No pain at rest at eval but intermittent higher pain with prolonged activity    Time 6    Period Weeks    Status On-going                 Plan - 07/12/20 1343    Clinical Impression Statement Still with gait/mobility limitations with SPC use/increased fall risk but making functional gains as evidenced by 9% improvement in FOTO score from baseline. Progress with therapy goals still ongoing-pt. would benefit from PT for further progess to address functional limitations.    Personal Factors and Comorbidities Comorbidity 2    Comorbidities osteoporosis, bilat. TKA    Examination-Activity Limitations Stand;Stairs;Locomotion Level;Lift;Transfers;Carry;Squat    Examination-Participation Restrictions Shop;Meal Prep;Community Activity;Laundry;Cleaning    Stability/Clinical Decision Making Stable/Uncomplicated    Clinical Decision Making Low    Rehab Potential Good    PT Frequency 2x / week    PT Duration 6 weeks    PT Treatment/Interventions ADLs/Self Care Home Management;Cryotherapy;Electrical Stimulation;Moist Heat;Gait training;Stair training;Functional mobility training;Therapeutic activities;Neuromuscular re-education;Therapeutic exercise;Patient/family education;Manual techniques;Passive range of motion;Vasopneumatic Device;Dry needling;Taping;Balance training    PT Next Visit Plan Continue open and closed chain LE strengthening as tolerated, balance/proprioceptove challenges, ankle ROM, proximal hip stability    PT Home Exercise Plan Access code: [email or web address]    Consulted  and  Agree with Plan of Care Patient           Patient will benefit from skilled therapeutic intervention in order to improve the following deficits and impairments:  Pain,Impaired flexibility,Decreased strength,Decreased activity tolerance,Decreased range of motion,Difficulty walking,Decreased balance,Abnormal gait  Visit Diagnosis: Pain in left ankle and joints of left foot  Stiffness of left ankle, not elsewhere classified  Localized edema  Difficulty in walking, not elsewhere classified  Muscle weakness (generalized)     Problem List Patient Active Problem List   Diagnosis Date Noted  . Nuclear sclerotic cataract of left eye 02/25/2020  . 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  . Exudative age-related macular degeneration of right eye with active choroidal neovascularization (HCC) 09/24/2019  . Choroidal neovascularization of right eye 09/24/2019  . Retinal hemorrhage of right eye 09/24/2019  . Retinal exudates and deposits 09/24/2019  . Cystoid macular edema of right eye 09/24/2019  . 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  . Mastodynia, female 03/19/2016  . Cystocele 02/08/2014  . Vaginal atrophy 01/28/2014  . History of herpes simplex infection 01/28/2014  . Knee pain, acute 03/05/2013  . Anxiety 02/05/2013  . Constipation 02/05/2013  . Depression 02/05/2013  . Psoriasis 02/05/2013  . Failed total knee arthroplasty (HCC) 01/23/2013  . Sciatic pain   . Detrusor instability   . Uterine prolapse   . Atrophic vaginitis   . Osteoporosis     Lazarus Gowda, PT, DPT 07/12/20 2:14 PM  [facility] [address] [place], [place], [address] Phone: [phone]   Fax:  [phone]  Name: ELEXIA FRIEDT MRN: [id number] Date of Birth: 03-Oct-1940

## 2020-07-14 ENCOUNTER — Ambulatory Visit: Payer: Medicare PPO | Admitting: Physical Therapy

## 2020-07-25 DIAGNOSIS — M7751 Other enthesopathy of right foot: Secondary | ICD-10-CM | POA: Diagnosis not present

## 2020-07-25 DIAGNOSIS — S86091D Other specified injury of right Achilles tendon, subsequent encounter: Secondary | ICD-10-CM | POA: Diagnosis not present

## 2020-07-26 ENCOUNTER — Other Ambulatory Visit: Payer: Self-pay

## 2020-07-26 ENCOUNTER — Encounter: Payer: Self-pay | Admitting: Physical Therapy

## 2020-07-26 ENCOUNTER — Ambulatory Visit: Payer: Medicare PPO | Attending: Podiatry | Admitting: Physical Therapy

## 2020-07-26 DIAGNOSIS — M25572 Pain in left ankle and joints of left foot: Secondary | ICD-10-CM | POA: Diagnosis not present

## 2020-07-26 DIAGNOSIS — R600 Localized edema: Secondary | ICD-10-CM | POA: Insufficient documentation

## 2020-07-26 DIAGNOSIS — M6281 Muscle weakness (generalized): Secondary | ICD-10-CM | POA: Diagnosis not present

## 2020-07-26 DIAGNOSIS — R262 Difficulty in walking, not elsewhere classified: Secondary | ICD-10-CM | POA: Insufficient documentation

## 2020-07-26 DIAGNOSIS — M25672 Stiffness of left ankle, not elsewhere classified: Secondary | ICD-10-CM | POA: Insufficient documentation

## 2020-07-26 NOTE — Therapy (Signed)
[place], [place], [address] Phone: [phone]   Fax:  [phone]  Physical Therapy Treatment/Recertification Progress Note Reporting Period 06/03/20 to 07/26/20  See note below for Objective Data and Assessment of Progress/Goals.       Patient Details  Name: [name] MRN: [id number] Date of Birth: Apr 13, 1941 Referring Provider (PT): Harlow Mares, DPM   Encounter Date: 07/26/2020   PT End of Session - 07/26/20 1337    Visit Number 8    Number of Visits 9    Date for PT Re-Evaluation 07/29/20    Authorization Type [employer] MCR    Authorization Time Period 06/03/20-07/29/20    Authorization - Visit Number 8    Authorization - Number of Visits 12    PT Start Time 1332    PT Stop Time 1414    PT Time Calculation (min) 42 min    Activity Tolerance Patient tolerated treatment well    Behavior During Therapy [facility] for tasks assessed/performed           Past Medical History:  Diagnosis Date  . Anxiety   . Arthritis   . Atrophic vaginitis   . Detrusor instability   . GERD (gastroesophageal reflux disease)   . Osteoporosis   . Psoriasis   . Sciatic pain   . Uterine prolapse     Past Surgical History:  Procedure Laterality Date  . APPENDECTOMY    . FOOT SURGERY    . KNEE SURGERY    . TOTAL KNEE ARTHROPLASTY Bilateral    X2-- 2009; 2010  . TOTAL KNEE REVISION Left 01/23/2013   Procedure:  TOTAL KNEE ARTHROPLASTY REVISION ;  Surgeon: Gearlean Alf, MD;  Location: [facility];  Service: Orthopedics;  Laterality: Left;    There were no vitals filed for this visit.   Subjective Assessment - 07/26/20 1334    Subjective Pt. returns, not seen for PT for therapy for the past 2 weeks due to schedule conflicts. She reports had follow up visit with Dr. Barkley Bruns and was told foot/heel looks good and prolonged expected timeframe of healing was discussed given etiology of injury and age. Pt. is scheduled  for 1 more session this week but per her request wishes to continue on her own with HEP and take a break from therapy. See assessment/plan.    Pertinent History osteoporosis, bilateral TKA              [facility] PT Assessment - 07/26/20 0001      AROM   Left Ankle Dorsiflexion 4    Left Ankle Plantar Flexion 45    Left Ankle Inversion 22    Left Ankle Eversion 14      Strength   Left Ankle Dorsiflexion 4+/5    Left Ankle Plantar Flexion 4/5   supine only   Left Ankle Inversion 5/5    Left Ankle Eversion 4+/5      Berg Balance Test   Sit to Stand Able to stand  independently using hands    Standing Unsupported Able to stand safely 2 minutes    Sitting with Back Unsupported but Feet Supported on Floor or Stool Able to sit safely and securely 2 minutes    Stand to Sit Controls descent by using hands    Transfers Able to transfer safely, definite need of hands    Standing Unsupported with Eyes Closed Able to stand 10 seconds with supervision    Standing Unsupported with Feet Together  Able to place feet together independently but unable to hold for 30 seconds    From Standing, Reach Forward with Outstretched Arm Can reach forward >12 cm safely (5")    From Standing Position, Pick up Object from [place] to pick up shoe, needs supervision    From Standing Position, Turn to Look Behind Over each Shoulder Looks behind from both sides and weight shifts well    Turn 360 Degrees Needs close supervision or verbal cueing    Standing Unsupported, Alternately Place Feet on Step/Stool Able to complete >2 steps/needs minimal assist    Standing Unsupported, One Foot in [place] to take small step independently and hold 30 seconds    Standing on One Leg Able to lift leg independently and hold equal to or more than 3 seconds    Total Score 38                         [facility] Adult PT Treatment/Exercise - 07/26/20 0001      Knee/Hip Exercises: Standing   Forward Step Up Right;Left;2  sets;10 reps;Hand Hold: 2;Step Height: 6"      Ankle Exercises: Stretches   Gastroc Stretch 3 reps;30 seconds   left side   Gastroc Stretch Limitations gentle supine manual stretch      Ankle Exercises: Standing   Rocker Board 2 minutes    Rocker Board Limitations dynamic lateral and fwrev  balance    Heel Raises Both;20 reps   2x10 on Airex   Other Standing Ankle Exercises Romberg eyes closed 20 sec x 3, BERG components with tandem stance, SLS and toe taps                    PT Short Term Goals - 07/26/20 1358      PT SHORT TERM GOAL #1   Title Independent with HEP    Baseline met    Time 3    Period Weeks    Status Achieved      PT SHORT TERM GOAL #2   Title Improve left ankle DF AROM 3-5 deg to improve toe clearance with gait    Baseline 4 deg    Time 3    Period Weeks    Status Achieved             PT Long Term Goals - 07/26/20 1358      PT LONG TERM GOAL #1   Title Improve FOTO outcome measure score to 39% or less impairment    Baseline 51% limited    Time 6    Period Weeks    Status On-going    Target Date 07/29/20      PT LONG TERM GOAL #2   Title Improve Berg Balance score t0 40/56 or greater to work towards decreased fall risk    Baseline 38/56    Time 6    Period Weeks    Status On-going    Target Date 07/29/20      PT LONG TERM GOAL #3   Title Pt. to be able to perform community level ambulation independently without AD    Baseline mod I with SPC    Time 6    Period Weeks    Status On-going    Target Date 07/29/20      PT LONG TERM GOAL #4   Title Increase left ankle strength grossly 1/2 MMT grade to improve ankle stability for outdoor ambulation over uneven  surfaces    Baseline partially met-see objective    Time 6    Period Weeks    Status Partially Met    Target Date 07/29/20      PT LONG TERM GOAL #5   Title Tolerate standing and ambulation for chores and shopping periods at least 30-40 min with ankle/heel pain 2/10 or  less    Baseline met-able 30 min    Time 6    Period Weeks    Status Achieved                 Plan - 07/26/20 1408    Clinical Impression Statement Pt. presentsfor 8th PT session s/p left Achilles rupture injury 02/18/20 with recent missed therapy due to schedule conflicts. Thus far she has shown improvements with 9% gain from baseline in FOTO score, 6 point improvement in [place] Balance score as well as gains in left ankle ROM and strength from baseline. Still with Berg score suggestive of increased fall risk and needing SPC for safety with ambulation. Pt. wishing to finish out this week of therapy and then take a break from formal visits to focus on HEP until she sees Dr. Barkley Bruns for next follow up so plan 1 more session this week and d/c to HEP. If needing further therapy at next follow up with DPM would plan get new PT order and resume more formal visits as needed.    Personal Factors and Comorbidities Comorbidity 2    Comorbidities osteoporosis, bilat. TKA    Examination-Activity Limitations Stand;Stairs;Locomotion Level;Lift;Transfers;Carry;Squat    Examination-Participation Restrictions Shop;Meal Prep;Community Activity;Laundry;Cleaning    Stability/Clinical Decision Making Stable/Uncomplicated    Clinical Decision Making Low    Rehab Potential Good    PT Frequency 2x / week    PT Duration --   1 week   PT Treatment/Interventions ADLs/Self Care Home Management;Cryotherapy;Electrical Stimulation;Moist Heat;Gait training;Stair training;Functional mobility training;Therapeutic activities;Neuromuscular re-education;Therapeutic exercise;Patient/family education;Manual techniques;Passive range of motion;Vasopneumatic Device;Dry needling;Taping;Balance training    PT Next Visit Plan d/c after next visit-recheck FOTO and update HEP as needed for closed and open chain strengthening, balance    PT Home Exercise Plan Access code: [email or web address]    Consulted and Agree with Plan of Care Patient            Patient will benefit from skilled therapeutic intervention in order to improve the following deficits and impairments:  Pain,Impaired flexibility,Decreased strength,Decreased activity tolerance,Decreased range of motion,Difficulty walking,Decreased balance,Abnormal gait  Visit Diagnosis: Pain in left ankle and joints of left foot  Stiffness of left ankle, not elsewhere classified  Localized edema  Difficulty in walking, not elsewhere classified  Muscle weakness (generalized)     Problem List Patient Active Problem List   Diagnosis Date Noted  . Nuclear sclerotic cataract of left eye 02/25/2020  . 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  . 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 Choroidal neovascularization of right eye 09/24/2019  . Retinal hemorrhage of right eye 09/24/2019  . Retinal exudates and deposits 09/24/2019  . Cystoid macular edema of right eye 09/24/2019  . 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  . Mastodynia, female 03/19/2016  . Cystocele 02/08/2014  . Vaginal atrophy 01/28/2014  . History of herpes simplex infection 01/28/2014  . Knee pain, acute 03/05/2013  . Anxiety 02/05/2013  . Constipation 02/05/2013  . Depression 02/05/2013  . Psoriasis 02/05/2013  . Failed total knee arthroplasty ([place]) 01/23/2013  . Sciatic pain   . Detrusor instability   .  Uterine prolapse   . Atrophic vaginitis   . Osteoporosis     Beaulah Dinning, PT, DPT 07/26/20 2:19 PM  [facility] [address] [place], [place], [address] Phone: [phone]   Fax:  [phone]  Name: SAMEERAH NACHTIGAL MRN: [id number] Date of Birth: [birth year]

## 2020-07-28 ENCOUNTER — Ambulatory Visit: Payer: Medicare PPO | Admitting: Physical Therapy

## 2020-08-02 ENCOUNTER — Encounter (INDEPENDENT_AMBULATORY_CARE_PROVIDER_SITE_OTHER): Payer: Medicare PPO | Admitting: Ophthalmology

## 2020-08-02 ENCOUNTER — Ambulatory Visit: Payer: Medicare PPO | Admitting: Nurse Practitioner

## 2020-08-02 ENCOUNTER — Encounter (INDEPENDENT_AMBULATORY_CARE_PROVIDER_SITE_OTHER): Payer: Self-pay | Admitting: Ophthalmology

## 2020-08-02 ENCOUNTER — Other Ambulatory Visit: Payer: Self-pay

## 2020-08-02 ENCOUNTER — Ambulatory Visit (INDEPENDENT_AMBULATORY_CARE_PROVIDER_SITE_OTHER): Payer: Medicare PPO | Admitting: Ophthalmology

## 2020-08-02 ENCOUNTER — Encounter: Payer: Self-pay | Admitting: Nurse Practitioner

## 2020-08-02 VITALS — BP 118/74 | HR 70 | Resp 16 | Wt 215.0 lb

## 2020-08-02 DIAGNOSIS — Z4689 Encounter for fitting and adjustment of other specified devices: Secondary | ICD-10-CM | POA: Diagnosis not present

## 2020-08-02 DIAGNOSIS — H353211 Exudative age-related macular degeneration, right eye, with active choroidal neovascularization: Secondary | ICD-10-CM | POA: Diagnosis not present

## 2020-08-02 DIAGNOSIS — H318 Other specified disorders of choroid: Secondary | ICD-10-CM

## 2020-08-02 MED ORDER — AFLIBERCEPT 2MG/0.05ML IZ SOLN FOR KALEIDOSCOPE
2.0000 mg | INTRAVITREAL | Status: AC | PRN
  Administered 2020-08-02: 2 mg via INTRAVITREAL

## 2020-08-02 NOTE — Progress Notes (Signed)
08/02/2020     CHIEF COMPLAINT Patient presents for Retina Follow Up (3 Month F/U OU, poss Eylea OD//Pt denies noticeable changes to [place] OU since last visit. Pt denies ocular pain, flashes of light, or floaters OU. //)   HISTORY OF PRESENT ILLNESS: [name] is a 80 y.o. female who presents to the clinic today for:   HPI    Retina Follow Up    Patient presents with  Wet AMD.  In right eye.  This started 3 months ago.  Severity is mild.  Duration of 3 months.  Since onset it is stable. Additional comments: 3 Month F/U OU, poss Eylea OD  Pt denies noticeable changes to [place] OU since last visit. Pt denies ocular pain, flashes of light, or floaters OU.          Last edited by Ileana Roup, COA on 08/02/2020  3:36 PM. (History)      Referring physician: Kirby Funk, MD [address] [place],  [place] [address]  HISTORICAL INFORMATION:   Selected notes from the MEDICAL RECORD NUMBER       CURRENT MEDICATIONS: No current outpatient medications on file. (Ophthalmic Drugs)   No current facility-administered medications for this visit. (Ophthalmic Drugs)   Current Outpatient Medications (Other)  Medication Sig  . amoxicillin (AMOXIL) 500 MG capsule amoxicillin 500 mg capsule  TAKE 4 CAPSULES 1 HOUR BEFORE [facility] DENTAL APPOINTMENT (Patient not taking: Reported on 08/02/2020)  . Calcium Carbonate-Vitamin D3 600-400 MG-UNIT TABS 1 tablet  . Cyanocobalamin (VITAMIN B-12 PO) Take 1 tablet by mouth daily.  Marland Kitchen FLUoxetine (PROZAC) 20 MG tablet Take 60 mg by mouth daily.  . Fluoxetine HCl, PMDD, 20 MG TABS fluoxetine 20 mg tablet  TAKE 3 TABLETS BY MOUTH EVERY DAY  . LORazepam (ATIVAN) 0.5 MG tablet lorazepam 0.5 mg tablet  . valACYclovir (VALTREX) 500 MG tablet TAKE 1 TABLET BY MOUTH EVERY DAY   No current facility-administered medications for this visit. (Other)      REVIEW OF SYSTEMS:    ALLERGIES No Known Allergies  PAST MEDICAL HISTORY Past Medical  History:  Diagnosis Date  . Anxiety   . Arthritis   . Atrophic vaginitis   . Detrusor instability   . GERD (gastroesophageal reflux disease)   . Osteoporosis   . Psoriasis   . Sciatic pain   . Uterine prolapse    Past Surgical History:  Procedure Laterality Date  . APPENDECTOMY    . FOOT SURGERY    . KNEE SURGERY    . TOTAL KNEE ARTHROPLASTY Bilateral    X2-- 2009; 2010  . TOTAL KNEE REVISION Left 01/23/2013   Procedure:  TOTAL KNEE ARTHROPLASTY REVISION ;  Surgeon: Loanne Drilling, MD;  Location: [facility];  Service: Orthopedics;  Laterality: Left;    FAMILY HISTORY Family History  Problem Relation Age of Onset  . Cancer Mother        GALLBLADDER   . Breast cancer Cousin        Mat. 1st cousin Age 33    SOCIAL HISTORY Social History   Tobacco Use  . Smoking status: Former Smoker    Packs/day: 0.20    Types: Cigarettes    Quit date: 07/25/2015    Years since quitting: 5.0  . Smokeless tobacco: Never Used  Vaping Use  . Vaping Use: Never used  Substance Use Topics  . Alcohol use: No    Alcohol/week: 0.0 standard drinks  . Drug use: No  OPHTHALMIC EXAM: Base Eye Exam    Visual Acuity (ETDRS)      Right Left   Dist cc CF @ 4' 20/40 +2   Dist ph cc NI 20/25 -2       Tonometry (Tonopen, 3:36 PM)      Right Left   Pressure 14 19       Pupils      Pupils Dark Light Shape React APD   Right PERRL 4 3 Round Slow None   Left PERRL 4 3 Round Slow None       Visual Fields (Counting fingers)      Left Right    Full Full       Extraocular Movement      Right Left    Full Full       Neuro/Psych    Oriented x3: Yes   Mood/Affect: Normal       Dilation    Both eyes: 1.0% Mydriacyl, 2.5% Phenylephrine @ 3:40 PM        Slit Lamp and Fundus Exam    External Exam      Right Left   External Normal Normal       Slit Lamp Exam      Right Left   Lids/Lashes Normal Normal   Conjunctiva/Sclera White and quiet White and quiet   Cornea Clear  Clear   Anterior Chamber Deep and quiet Deep and quiet   Iris Round and reactive Round and reactive   Lens Posterior chamber intraocular lens    Anterior Vitreous Normal Normal       Fundus Exam      Right Left   Posterior Vitreous Posterior vitreous detachment    Disc Normal    C/D Ratio 0.5    Macula Geographic atrophy in the FAZ, no membrane, Disciform scar, Advanced age related macular degeneration    Vessels Normal    Periphery Normal           IMAGING AND PROCEDURES  Imaging and Procedures for 08/02/20  OCT, Retina - OU - Both Eyes       Right Eye Quality was good. Scan locations included subfoveal. Central Foveal Thickness: 148. Progression has improved.   Left Eye Quality was good. Scan locations included subfoveal. Central Foveal Thickness: 260.   Notes Massive improvement in macular and pericapillary at anatomy since onset of wet ARMD November 2020 OD.  Subfoveal location of CNVM and fluid however have accounted for vision loss.  Current follow-up with an active lesion on OCT and clinical examination is 3 months post intravitreal Eylea       Intravitreal Injection, Pharmacologic Agent - OD - Right Eye       Time Out 08/02/2020. 4:19 PM. Confirmed correct patient, procedure, site, and patient consented.   Anesthesia Topical anesthesia was used.   Procedure Preparation included Ofloxacin , 10% betadine to eyelids, 5% betadine to ocular surface, Tobramycin 0.3%. A 30 gauge needle was used.   Injection:  2 mg aflibercept Gretta Cool) SOLN   NDC: [id number], Lot: [id number]   Route: Intravitreal, Site: Right Eye, Waste: 0 mg  Post-op Post injection exam found visual acuity of at least counting fingers. The patient tolerated the procedure well. There were no complications. The patient received written and verbal post procedure care education. Post injection medications were not given.                 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  These findings were reviewed with the patient.  She understands the goal is to prevent recurrence and enlargement of the and scotoma      ICD-10-CM   1. Exudative age-related macular degeneration of right eye with active choroidal neovascularization (HCC)  H35.3211 OCT, Retina - OU - Both Eyes    Intravitreal Injection, Pharmacologic Agent - OD - Right Eye    aflibercept (EYLEA) SOLN 2 mg  2. Idiopathic polypoidal choroidal vasculopathy  H31.8     1.  Improve macular anatomy of the right eye and inactive CNVM, with polypoidal form of disease which has high risk of recurrence.  Controlled currently at 3 months on intravitreal Eylea.  We will repeat injection today  2.  Light OU next and consider injection Eylea OD at 4 months  3.  Ophthalmic Meds Ordered this visit:  Meds ordered this encounter  Medications  . aflibercept (EYLEA) SOLN 2 mg       Return in about 4 months (around 11/30/2020) for DILATE OU, EYLEA OCT, OD.  There are no Patient Instructions on file for this visit.   Explained the diagnoses, plan, and follow up with the patient and they expressed understanding.  Patient expressed understanding of the importance of proper follow up care.   Alford Highland Tyreisha Ungar M.D. Diseases & Surgery of the Retina and Vitreous [facility] 08/02/20     Abbreviations: M myopia (nearsighted); A astigmatism; H hyperopia (farsighted); P presbyopia; Mrx spectacle prescription;  CTL contact lenses; OD right eye; OS left eye; OU both eyes  XT exotropia; ET esotropia; PEK punctate  epithelial keratitis; PEE punctate epithelial erosions; DES dry eye syndrome; MGD meibomian gland dysfunction; ATs artificial tears; PFAT's preservative free artificial tears; NSC nuclear sclerotic cataract; PSC posterior subcapsular cataract; ERM epi-retinal membrane; PVD posterior vitreous detachment; RD retinal detachment; DM diabetes mellitus; DR diabetic retinopathy; NPDR non-proliferative diabetic retinopathy; PDR proliferative diabetic retinopathy; CSME clinically significant macular edema; DME diabetic macular edema; dbh dot blot hemorrhages; CWS cotton wool spot; POAG primary open angle glaucoma; C/D cup-to-disc ratio; HVF humphrey visual field; GVF goldmann visual field; OCT optical coherence tomography; IOP intraocular pressure; BRVO Branch retinal vein occlusion; CRVO central retinal vein occlusion; CRAO central retinal artery occlusion; BRAO branch retinal artery occlusion; RT retinal tear; SB scleral buckle; PPV pars plana vitrectomy; VH Vitreous hemorrhage; PRP panretinal laser photocoagulation; IVK intravitreal kenalog; VMT vitreomacular traction; MH Macular hole;  NVD neovascularization of the disc; NVE neovascularization elsewhere; AREDS age related eye disease study; ARMD age related macular degeneration; POAG primary open angle glaucoma; EBMD epithelial/anterior basement membrane dystrophy; ACIOL anterior chamber intraocular lens; IOL intraocular lens; PCIOL posterior chamber intraocular lens; Phaco/IOL phacoemulsification with intraocular lens placement; PRK photorefractive keratectomy; LASIK laser assisted in situ keratomileusis; HTN hypertension; DM diabetes mellitus; COPD chronic obstructive pulmonary disease

## 2020-08-02 NOTE — Assessment & Plan Note (Signed)
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  These findings were reviewed with the patient.  She understands the goal is to prevent recurrence and enlargement of the and scotoma

## 2020-08-02 NOTE — Assessment & Plan Note (Signed)
Vastly improved since onset of disease November 2020.  We will repeat injection today with intravitreal Eylea and examination again in 4 months

## 2020-08-02 NOTE — Progress Notes (Signed)
GYNECOLOGY  VISIT  CC:  Pessary removal  HPI: 80 y.o. [id number] Married [place] American female here for removal of pessary.  She has been managing pessary herself but can not get it to come out this time. Has a Gelhorn. It has been in about 3 months, has been trying to get it out about 1 month.     GYNECOLOGIC HISTORY: No LMP recorded. Patient is postmenopausal. Contraception: post menopausal Menopausal hormone therapy: none  Patient Active Problem List   Diagnosis Date Noted  . Nuclear sclerotic cataract of left eye 02/25/2020  . 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  . Exudative age-related macular degeneration of right eye with active choroidal neovascularization (HCC) 09/24/2019  . Choroidal neovascularization of right eye 09/24/2019  . Retinal hemorrhage of right eye 09/24/2019  . Retinal exudates and deposits 09/24/2019  . Cystoid macular edema of right eye 09/24/2019  . 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  . Mastodynia, female 03/19/2016  . Cystocele 02/08/2014  . Vaginal atrophy 01/28/2014  . History of herpes simplex infection 01/28/2014  . Knee pain, acute 03/05/2013  . Anxiety 02/05/2013  . Constipation 02/05/2013  . Depression 02/05/2013  . Psoriasis 02/05/2013  . Failed total knee arthroplasty (HCC) 01/23/2013  . Sciatic pain   . Detrusor instability   . Uterine prolapse   . Atrophic vaginitis   . Osteoporosis     Past Medical History:  Diagnosis Date  . Anxiety   . Arthritis   . Atrophic vaginitis   . Detrusor instability   . GERD (gastroesophageal reflux disease)   . Osteoporosis   . Psoriasis   . Sciatic pain   . Uterine prolapse     Past Surgical History:  Procedure Laterality Date  . APPENDECTOMY    . FOOT SURGERY    . KNEE SURGERY    . TOTAL KNEE ARTHROPLASTY Bilateral    X2-- 2009; 2010  . TOTAL KNEE REVISION Left 01/23/2013   Procedure:  TOTAL KNEE ARTHROPLASTY REVISION ;  Surgeon: Loanne Drilling, MD;  Location: [facility];  Service: Orthopedics;  Laterality: Left;    MEDS:   Current Outpatient Medications on File Prior to Visit  Medication Sig Dispense Refill  . Calcium Carbonate-Vitamin D3 600-400 MG-UNIT TABS 1 tablet    . Cyanocobalamin (VITAMIN B-12 PO) Take 1 tablet by mouth daily.    Marland Kitchen FLUoxetine (PROZAC) 20 MG tablet Take 60 mg by mouth daily.    . Fluoxetine HCl, PMDD, 20 MG TABS fluoxetine 20 mg tablet  TAKE 3 TABLETS BY MOUTH EVERY DAY    . LORazepam (ATIVAN) 0.5 MG tablet lorazepam 0.5 mg tablet    . valACYclovir (VALTREX) 500 MG tablet TAKE 1 TABLET BY MOUTH EVERY DAY 90 tablet 3  . amoxicillin (AMOXIL) 500 MG capsule amoxicillin 500 mg capsule  TAKE 4 CAPSULES 1 HOUR BEFORE [facility] DENTAL APPOINTMENT (Patient not taking: Reported on 08/02/2020)     No current facility-administered medications on file prior to visit.    ALLERGIES: Patient has no known allergies.  Family History  Problem Relation Age of Onset  . Cancer Mother        GALLBLADDER   . Breast cancer Cousin        Mat. 1st cousin Age 7     Review of Systems  Constitutional: Negative.   HENT: Negative.   Eyes: Negative.   Respiratory: Negative.   Cardiovascular: Negative.   Gastrointestinal: Negative.   Endocrine: Negative.   Genitourinary: Negative.   Musculoskeletal:  Negative.   Skin: Negative.   Allergic/Immunologic: Negative.   Neurological: Negative.   Hematological: Negative.   Psychiatric/Behavioral: Negative.     PHYSICAL EXAMINATION:    BP 118/74   Pulse 70   Resp 16   Wt 215 lb (97.5 kg)   BMI 34.18 kg/m     General appearance: alert, cooperative, no acute distress  Pelvic: External genitalia:  no lesions              Urethra:  normal appearing urethra with no masses, tenderness or lesions              Bartholins and Skenes: normal                 Vagina: normal appearing vagina             Attempted to remove by breaking suction with fingers but difficult. Applied  ring forceps to horn portion of pessary and was able to pull and break suction with fingers. Difficult removal but pt tolerated well. After removal assessed vagina and cervix, no skin breakdown  Chaperone, Joy, CMA, was present for exam.  Assessment: Pessary removal  Plan: Pt plans to reinsert pessary in a few days

## 2020-08-18 ENCOUNTER — Ambulatory Visit: Payer: Medicare PPO | Admitting: Physical Therapy

## 2020-08-22 DIAGNOSIS — Z1231 Encounter for screening mammogram for malignant neoplasm of breast: Secondary | ICD-10-CM | POA: Diagnosis not present

## 2020-09-05 DIAGNOSIS — M7662 Achilles tendinitis, left leg: Secondary | ICD-10-CM | POA: Diagnosis not present

## 2020-09-05 DIAGNOSIS — S86092D Other specified injury of left Achilles tendon, subsequent encounter: Secondary | ICD-10-CM | POA: Diagnosis not present

## 2020-09-06 NOTE — Therapy (Signed)
[place], [place], [address] Phone: [phone]   Fax:  [phone]  Physical Therapy Treatment/Discharge  Patient Details  Name: [name] MRN: [id number] Date of Birth: 1941/02/28 Referring Provider (PT): Harlow Mares, DPM   Encounter Date: 07/26/2020    Past Medical History:  Diagnosis Date  . Anxiety   . Arthritis   . Atrophic vaginitis   . Detrusor instability   . GERD (gastroesophageal reflux disease)   . Osteoporosis   . Psoriasis   . Sciatic pain   . Uterine prolapse     Past Surgical History:  Procedure Laterality Date  . APPENDECTOMY    . FOOT SURGERY    . KNEE SURGERY    . TOTAL KNEE ARTHROPLASTY Bilateral    X2-- 2009; 2010  . TOTAL KNEE REVISION Left 01/23/2013   Procedure:  TOTAL KNEE ARTHROPLASTY REVISION ;  Surgeon: Gearlean Alf, MD;  Location: [facility];  Service: Orthopedics;  Laterality: Left;    There were no vitals filed for this visit.                                PT Short Term Goals - 07/26/20 1358      PT SHORT TERM GOAL #1   Title Independent with HEP    Baseline met    Time 3    Period Weeks    Status Achieved      PT SHORT TERM GOAL #2   Title Improve left ankle DF AROM 3-5 deg to improve toe clearance with gait    Baseline 4 deg    Time 3    Period Weeks    Status Achieved             PT Long Term Goals - 07/26/20 1358      PT LONG TERM GOAL #1   Title Improve FOTO outcome measure score to 39% or less impairment    Baseline 51% limited    Time 6    Period Weeks    Status On-going    Target Date 07/29/20      PT LONG TERM GOAL #2   Title Improve Berg Balance score t0 40/56 or greater to work towards decreased fall risk    Baseline 38/56    Time 6    Period Weeks    Status On-going    Target Date 07/29/20      PT LONG TERM GOAL #3   Title Pt. to be able to perform community level ambulation  independently without AD    Baseline mod I with SPC    Time 6    Period Weeks    Status On-going    Target Date 07/29/20      PT LONG TERM GOAL #4   Title Increase left ankle strength grossly 1/2 MMT grade to improve ankle stability for outdoor ambulation over uneven surfaces    Baseline partially met-see objective    Time 6    Period Weeks    Status Partially Met    Target Date 07/29/20      PT LONG TERM GOAL #5   Title Tolerate standing and ambulation for chores and shopping periods at least 30-40 min with ankle/heel pain 2/10 or less    Baseline met-able 30 min    Time 6    Period Weeks    Status Achieved  Patient will benefit from skilled therapeutic intervention in order to improve the following deficits and impairments:  Pain,Impaired flexibility,Decreased strength,Decreased activity tolerance,Decreased range of motion,Difficulty walking,Decreased balance,Abnormal gait  Visit Diagnosis: Pain in left ankle and joints of left foot - Plan: PT plan of care cert/re-cert  Stiffness of left ankle, not elsewhere classified - Plan: PT plan of care cert/re-cert  Localized edema - Plan: PT plan of care cert/re-cert  Difficulty in walking, not elsewhere classified - Plan: PT plan of care cert/re-cert  Muscle weakness (generalized) - Plan: PT plan of care cert/re-cert     Problem List Patient Active Problem List   Diagnosis Date Noted  . Nuclear sclerotic cataract of left eye 02/25/2020  . 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  . Exudative age-related macular degeneration of right eye with active choroidal neovascularization ([place]) 09/24/2019  . Choroidal neovascularization of right eye 09/24/2019  . Retinal hemorrhage of right eye 09/24/2019  . Retinal exudates and deposits 09/24/2019  . Cystoid macular edema of right eye 09/24/2019  . 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  . Mastodynia, female 03/19/2016  . Cystocele  02/08/2014  . Vaginal atrophy 01/28/2014  . History of herpes simplex infection 01/28/2014  . Knee pain, acute 03/05/2013  . Anxiety 02/05/2013  . Constipation 02/05/2013  . Depression 02/05/2013  . Psoriasis 02/05/2013  . Failed total knee arthroplasty ([place]) 01/23/2013  . Sciatic pain   . Detrusor instability   . Uterine prolapse   . Atrophic vaginitis   . Osteoporosis        PHYSICAL THERAPY DISCHARGE SUMMARY  Visits from Start of Care: 8  Current functional level related to goals / functional outcomes: Patient did not return for further therapy visits after last session 07/26/20 with last 2 scheduled sessions cancelled. Patient had requested to d/c formal therapy visits and try continuing via HEP. Recommend follow up with Dr. Barkley Bruns with any future changes in status/if needing any further therapy in the future.   Remaining deficits: Left ankle weakness, decreased balance, gait impairment   Education / Equipment: HEP Plan: Patient agrees to discharge.  Patient goals were partially met. Patient is being discharged due to the patient's request.  ?????         Beaulah Dinning, PT, DPT 09/06/20 12:35 PM    [facility] [address] [place], [place], [address] Phone: [phone]   Fax:  [phone]  Name: [name] MRN: [id number] Date of Birth: 1940-09-16

## 2020-12-01 ENCOUNTER — Other Ambulatory Visit: Payer: Self-pay

## 2020-12-01 ENCOUNTER — Ambulatory Visit (INDEPENDENT_AMBULATORY_CARE_PROVIDER_SITE_OTHER): Payer: Medicare PPO | Admitting: Ophthalmology

## 2020-12-01 ENCOUNTER — Encounter (INDEPENDENT_AMBULATORY_CARE_PROVIDER_SITE_OTHER): Payer: Self-pay | Admitting: Ophthalmology

## 2020-12-01 DIAGNOSIS — H35351 Cystoid macular degeneration, right eye: Secondary | ICD-10-CM

## 2020-12-01 DIAGNOSIS — H353211 Exudative age-related macular degeneration, right eye, with active choroidal neovascularization: Secondary | ICD-10-CM

## 2020-12-01 DIAGNOSIS — H353121 Nonexudative age-related macular degeneration, left eye, early dry stage: Secondary | ICD-10-CM

## 2020-12-01 MED ORDER — AFLIBERCEPT 2MG/0.05ML IZ SOLN FOR KALEIDOSCOPE
2.0000 mg | INTRAVITREAL | Status: AC | PRN
  Administered 2020-12-01: 2 mg via INTRAVITREAL

## 2020-12-01 NOTE — Progress Notes (Signed)
12/01/2020     CHIEF COMPLAINT Patient presents for Retina Follow Up (4 month fu OU and OCT- Eylea OD/Pt states [facility] OU stable since last visit. Pt denies FOL, floaters, or ocular pain OU. /)   HISTORY OF PRESENT ILLNESS: [name] is a 80 y.o. female who presents to the clinic today for:   HPI     Retina Follow Up           Diagnosis: Wet AMD   Laterality: right eye   Onset: 4 months ago   Severity: mild   Duration: 4 months   Course: stable   Comments: 4 month fu OU and OCT- Eylea OD Pt states VA OU stable since last visit. Pt denies FOL, floaters, or ocular pain OU.         Last edited by Demetrios Loll, COA on 12/01/2020  3:42 PM.      Referring physician: Kirby Funk, MD [address] [place],  [place] [address]  HISTORICAL INFORMATION:   Selected notes from the MEDICAL RECORD NUMBER       CURRENT MEDICATIONS: No current outpatient medications on file. (Ophthalmic Drugs)   No current facility-administered medications for this visit. (Ophthalmic Drugs)   Current Outpatient Medications (Other)  Medication Sig   amoxicillin (AMOXIL) 500 MG capsule amoxicillin 500 mg capsule  TAKE 4 CAPSULES 1 HOUR BEFORE [facility] DENTAL APPOINTMENT (Patient not taking: Reported on 08/02/2020)   Calcium Carbonate-Vitamin D3 600-400 MG-UNIT TABS 1 tablet   Cyanocobalamin (VITAMIN B-12 PO) Take 1 tablet by mouth daily.   FLUoxetine (PROZAC) 20 MG tablet Take 60 mg by mouth daily.   Fluoxetine HCl, PMDD, 20 MG TABS fluoxetine 20 mg tablet  TAKE 3 TABLETS BY MOUTH EVERY DAY   LORazepam (ATIVAN) 0.5 MG tablet lorazepam 0.5 mg tablet   valACYclovir (VALTREX) 500 MG tablet TAKE 1 TABLET BY MOUTH EVERY DAY   No current facility-administered medications for this visit. (Other)      REVIEW OF SYSTEMS:    ALLERGIES No Known Allergies  PAST MEDICAL HISTORY Past Medical History:  Diagnosis Date   Anxiety    Arthritis    Atrophic vaginitis    Detrusor  instability    GERD (gastroesophageal reflux disease)    Osteoporosis    Psoriasis    Sciatic pain    Uterine prolapse    Past Surgical History:  Procedure Laterality Date   APPENDECTOMY     FOOT SURGERY     KNEE SURGERY     TOTAL KNEE ARTHROPLASTY Bilateral    X2-- 2009; 2010   TOTAL KNEE REVISION Left 01/23/2013   Procedure:  TOTAL KNEE ARTHROPLASTY REVISION ;  Surgeon: Loanne Drilling, MD;  Location: [facility];  Service: Orthopedics;  Laterality: Left;    FAMILY HISTORY Family History  Problem Relation Age of Onset   Cancer Mother        GALLBLADDER    Breast cancer Cousin        Mat. 1st cousin Age 54    SOCIAL HISTORY Social History   Tobacco Use   Smoking status: Former    Packs/day: 0.20    Pack years: 0.00    Types: Cigarettes    Quit date: 07/25/2015    Years since quitting: 5.3   Smokeless tobacco: Never  Vaping Use   Vaping Use: Never used  Substance Use Topics   Alcohol use: No    Alcohol/week: 0.0 standard drinks   Drug use:  No         OPHTHALMIC EXAM:  Base Eye Exam     Visual Acuity (ETDRS)       Right Left   Dist cc CF at 3' 20/50 -1   Dist ph cc  20/30 +2    Correction: Glasses         Tonometry (Tonopen, 3:47 PM)       Right Left   Pressure 15 19         Pupils       Pupils Dark Light Shape React APD   Right PERRL 4 3 Round Slow None   Left PERRL 4 3 Round Slow None         Visual Fields (Counting fingers)       Left Right    Full Full         Extraocular Movement       Right Left    Full Full         Neuro/Psych     Oriented x3: Yes   Mood/Affect: Normal         Dilation     Both eyes: 1.0% Mydriacyl, 2.5% Phenylephrine @ 3:47 PM           Slit Lamp and Fundus Exam     External Exam       Right Left   External Normal Normal         Slit Lamp Exam       Right Left   Lids/Lashes Normal Normal   Conjunctiva/Sclera White and quiet White and quiet   Cornea Clear Clear   Anterior  Chamber Deep and quiet Deep and quiet   Iris Round and reactive Round and reactive   Lens Posterior chamber intraocular lens    Anterior Vitreous Normal Normal         Fundus Exam       Right Left   Posterior Vitreous Posterior vitreous detachment    Disc Normal    C/D Ratio 0.5    Macula Geographic atrophy in the FAZ, no membrane, Disciform scar, Advanced age related macular degeneration    Vessels Normal    Periphery Normal             IMAGING AND PROCEDURES  Imaging and Procedures for 12/01/20  OCT, Retina - OU - Both Eyes       Right Eye Quality was good. Scan locations included subfoveal. Central Foveal Thickness: 144. Progression has improved. Findings include abnormal foveal contour.   Left Eye Quality was good. Scan locations included subfoveal. Central Foveal Thickness: 257. Progression has been stable.   Notes Massive improvement in macular and pericapillary at anatomy since onset of wet ARMD November 2020 OD.  Subfoveal location of CNVM and fluid however have accounted for vision loss.  Current follow-up with an active lesion on OCT and clinical examination is 4 months post intravitreal Eylea     Intravitreal Injection, Pharmacologic Agent - OD - Right Eye       Time Out 12/01/2020. 4:06 PM. Confirmed correct patient, procedure, site, and patient consented.   Anesthesia Topical anesthesia was used.   Procedure Preparation included Ofloxacin , 10% betadine to eyelids, 5% betadine to ocular surface, Tobramycin 0.3%. A 30 gauge needle was used.   Injection: 2 mg aflibercept 2 MG/0.05ML   Route: Intravitreal, Site: Right Eye   NDC: [id number], Lot: [id number], Waste: 0 mL   Post-op Post injection exam found visual acuity of at least  counting fingers. The patient tolerated the procedure well. There were no complications. The patient received written and verbal post procedure care education. Post injection medications were not given.               ASSESSMENT/PLAN:  Cystoid macular edema of right eye As prior component of CNVM polypoidal resolved  Exudative age-related macular degeneration of right eye with active choroidal neovascularization (HCC) History of polypoidal form of ARMD.  Vastly improved and stabilized after 27-month interval in the past now 10-month interval today post Eylea.  We will repeat injection today and plan Eylea injection in 5 months yet may also observe in 5 months upon follow-up dilate OU next     ICD-10-CM   1. Exudative age-related macular degeneration of right eye with active choroidal neovascularization (HCC)  H35.3211 OCT, Retina - OU - Both Eyes    Intravitreal Injection, Pharmacologic Agent - OD - Right Eye    aflibercept (EYLEA) SOLN 2 mg    2. Cystoid macular edema of right eye  H35.351     3. Early stage nonexudative age-related macular degeneration of left eye  H35.3121       1.  2.  3.  Ophthalmic Meds Ordered this visit:  Meds ordered this encounter  Medications   aflibercept (EYLEA) SOLN 2 mg       Return in about 5 months (around 05/03/2021) for DILATE OU, EYLEA OCT, OD.  There are no Patient Instructions on file for this visit.   Explained the diagnoses, plan, and follow up with the patient and they expressed understanding.  Patient expressed understanding of the importance of proper follow up care.   Alford Highland Cornisha Zetino M.D. Diseases & Surgery of the Retina and Vitreous [facility] 12/01/20     Abbreviations: M myopia (nearsighted); A astigmatism; H hyperopia (farsighted); P presbyopia; Mrx spectacle prescription;  CTL contact lenses; OD right eye; OS left eye; OU both eyes  XT exotropia; ET esotropia; PEK punctate epithelial keratitis; PEE punctate epithelial erosions; DES dry eye syndrome; MGD meibomian gland dysfunction; ATs artificial tears; PFAT's preservative free artificial tears; NSC nuclear sclerotic cataract; PSC posterior subcapsular  cataract; ERM epi-retinal membrane; PVD posterior vitreous detachment; RD retinal detachment; DM diabetes mellitus; DR diabetic retinopathy; NPDR non-proliferative diabetic retinopathy; PDR proliferative diabetic retinopathy; CSME clinically significant macular edema; DME diabetic macular edema; dbh dot blot hemorrhages; CWS cotton wool spot; POAG primary open angle glaucoma; C/D cup-to-disc ratio; HVF humphrey visual field; GVF goldmann visual field; OCT optical coherence tomography; IOP intraocular pressure; BRVO Branch retinal vein occlusion; CRVO central retinal vein occlusion; CRAO central retinal artery occlusion; BRAO branch retinal artery occlusion; RT retinal tear; SB scleral buckle; PPV pars plana vitrectomy; VH Vitreous hemorrhage; PRP panretinal laser photocoagulation; IVK intravitreal kenalog; VMT vitreomacular traction; MH Macular hole;  NVD neovascularization of the disc; NVE neovascularization elsewhere; AREDS age related eye disease study; ARMD age related macular degeneration; POAG primary open angle glaucoma; EBMD epithelial/anterior basement membrane dystrophy; ACIOL anterior chamber intraocular lens; IOL intraocular lens; PCIOL posterior chamber intraocular lens; Phaco/IOL phacoemulsification with intraocular lens placement; PRK photorefractive keratectomy; LASIK laser assisted in situ keratomileusis; HTN hypertension; DM diabetes mellitus; COPD chronic obstructive pulmonary disease

## 2020-12-01 NOTE — Assessment & Plan Note (Signed)
History of polypoidal form of ARMD.  Vastly improved and stabilized after 38-month interval in the past now 4-month interval today post Eylea.  We will repeat injection today and plan Eylea injection in 5 months yet may also observe in 5 months upon follow-up dilate OU next

## 2020-12-01 NOTE — Assessment & Plan Note (Signed)
As prior component of CNVM polypoidal resolved

## 2020-12-01 NOTE — Assessment & Plan Note (Signed)
Minor no signs of complications by OCT or exam

## 2020-12-12 DIAGNOSIS — M7662 Achilles tendinitis, left leg: Secondary | ICD-10-CM | POA: Diagnosis not present

## 2020-12-12 DIAGNOSIS — S86092D Other specified injury of left Achilles tendon, subsequent encounter: Secondary | ICD-10-CM | POA: Diagnosis not present

## 2020-12-23 ENCOUNTER — Ambulatory Visit: Payer: Medicare PPO | Admitting: Obstetrics & Gynecology

## 2021-01-14 DIAGNOSIS — Z20822 Contact with and (suspected) exposure to covid-19: Secondary | ICD-10-CM | POA: Diagnosis not present

## 2021-02-23 ENCOUNTER — Other Ambulatory Visit: Payer: Self-pay | Admitting: Obstetrics & Gynecology

## 2021-02-23 NOTE — Telephone Encounter (Signed)
AEX schedueld 03/14/21.

## 2021-03-14 ENCOUNTER — Ambulatory Visit: Payer: Medicare PPO | Admitting: Nurse Practitioner

## 2021-03-21 DIAGNOSIS — Z87891 Personal history of nicotine dependence: Secondary | ICD-10-CM | POA: Diagnosis not present

## 2021-03-21 DIAGNOSIS — I739 Peripheral vascular disease, unspecified: Secondary | ICD-10-CM | POA: Diagnosis not present

## 2021-03-21 DIAGNOSIS — G8314 Monoplegia of lower limb affecting left nondominant side: Secondary | ICD-10-CM | POA: Diagnosis not present

## 2021-03-21 DIAGNOSIS — Z9181 History of falling: Secondary | ICD-10-CM | POA: Diagnosis not present

## 2021-03-21 DIAGNOSIS — F32A Depression, unspecified: Secondary | ICD-10-CM | POA: Diagnosis not present

## 2021-03-21 DIAGNOSIS — R030 Elevated blood-pressure reading, without diagnosis of hypertension: Secondary | ICD-10-CM | POA: Diagnosis not present

## 2021-03-21 DIAGNOSIS — R32 Unspecified urinary incontinence: Secondary | ICD-10-CM | POA: Diagnosis not present

## 2021-03-27 ENCOUNTER — Ambulatory Visit: Payer: Medicare PPO | Admitting: Nurse Practitioner

## 2021-03-27 ENCOUNTER — Ambulatory Visit: Payer: Medicare PPO | Admitting: Obstetrics & Gynecology

## 2021-03-29 ENCOUNTER — Ambulatory Visit
Admission: RE | Admit: 2021-03-29 | Discharge: 2021-03-29 | Disposition: A | Discharge status: Home / Self Care | Payer: Medicare PPO | Source: Ambulatory Visit | Attending: Internal Medicine | Admitting: Internal Medicine

## 2021-03-29 ENCOUNTER — Other Ambulatory Visit: Payer: Self-pay | Admitting: Internal Medicine

## 2021-03-29 DIAGNOSIS — M79605 Pain in left leg: Secondary | ICD-10-CM | POA: Diagnosis not present

## 2021-03-29 DIAGNOSIS — M25551 Pain in right hip: Secondary | ICD-10-CM

## 2021-03-29 DIAGNOSIS — M25552 Pain in left hip: Secondary | ICD-10-CM

## 2021-03-29 DIAGNOSIS — W19XXXA Unspecified fall, initial encounter: Secondary | ICD-10-CM | POA: Diagnosis not present

## 2021-03-29 DIAGNOSIS — M79604 Pain in right leg: Secondary | ICD-10-CM | POA: Diagnosis not present

## 2021-03-29 DIAGNOSIS — R413 Other amnesia: Secondary | ICD-10-CM | POA: Diagnosis not present

## 2021-03-29 DIAGNOSIS — M170 Bilateral primary osteoarthritis of knee: Secondary | ICD-10-CM | POA: Diagnosis not present

## 2021-03-29 DIAGNOSIS — S0990XA Unspecified injury of head, initial encounter: Secondary | ICD-10-CM | POA: Diagnosis not present

## 2021-03-30 ENCOUNTER — Other Ambulatory Visit: Payer: Self-pay | Admitting: Internal Medicine

## 2021-03-30 DIAGNOSIS — M79605 Pain in left leg: Secondary | ICD-10-CM

## 2021-04-05 DIAGNOSIS — F411 Generalized anxiety disorder: Secondary | ICD-10-CM | POA: Diagnosis not present

## 2021-04-12 ENCOUNTER — Other Ambulatory Visit: Payer: Medicare PPO

## 2021-04-18 ENCOUNTER — Other Ambulatory Visit: Payer: Medicare PPO

## 2021-04-24 ENCOUNTER — Ambulatory Visit: Payer: Medicare PPO | Attending: Internal Medicine

## 2021-04-24 ENCOUNTER — Ambulatory Visit
Admission: RE | Admit: 2021-04-24 | Discharge: 2021-04-24 | Disposition: A | Discharge status: Home / Self Care | Payer: Medicare PPO | Source: Ambulatory Visit | Attending: Internal Medicine | Admitting: Internal Medicine

## 2021-04-24 DIAGNOSIS — M79605 Pain in left leg: Secondary | ICD-10-CM

## 2021-04-24 DIAGNOSIS — I739 Peripheral vascular disease, unspecified: Secondary | ICD-10-CM | POA: Diagnosis not present

## 2021-05-04 ENCOUNTER — Ambulatory Visit (INDEPENDENT_AMBULATORY_CARE_PROVIDER_SITE_OTHER): Payer: Medicare PPO | Admitting: Ophthalmology

## 2021-05-04 ENCOUNTER — Encounter (INDEPENDENT_AMBULATORY_CARE_PROVIDER_SITE_OTHER): Payer: Self-pay | Admitting: Ophthalmology

## 2021-05-04 ENCOUNTER — Other Ambulatory Visit: Payer: Self-pay

## 2021-05-04 DIAGNOSIS — H353114 Nonexudative age-related macular degeneration, right eye, advanced atrophic with subfoveal involvement: Secondary | ICD-10-CM | POA: Diagnosis not present

## 2021-05-04 DIAGNOSIS — H353211 Exudative age-related macular degeneration, right eye, with active choroidal neovascularization: Secondary | ICD-10-CM | POA: Diagnosis not present

## 2021-05-04 DIAGNOSIS — H353212 Exudative age-related macular degeneration, right eye, with inactive choroidal neovascularization: Secondary | ICD-10-CM

## 2021-05-04 NOTE — Assessment & Plan Note (Signed)
Previous subfoveal CNVM with choriocapillaris RPE atrophy, no signs of recurrence today.  Of CNVM

## 2021-05-04 NOTE — Assessment & Plan Note (Signed)
Peripapillary and subpapillomacular bundle CNVM has completely resolved now currently follow-up interval of 5 months postinjection Eylea thus 3 months off effective therapy and no sign of recurrence.  Visual acuity limited by subfoveal atrophy

## 2021-05-04 NOTE — Progress Notes (Signed)
05/04/2021     CHIEF COMPLAINT Patient presents for  Chief Complaint  Patient presents with   Retina Follow Up      HISTORY OF PRESENT ILLNESS: [name] is a 80 y.o. female who presents to the clinic today for:   HPI     Retina Follow Up   Patient presents with  Wet AMD.  In both eyes.  This started 5 months ago.  Duration of 5 months.  Since onset it is stable.        Comments   5 month f/u OU with OCT and possible Eylea injection OD      Last edited by Frederik Pear, COA on 05/04/2021  3:58 PM.      Referring physician: Kirby Funk, MD [address] [place],  [place] [address]  HISTORICAL INFORMATION:   Selected notes from the MEDICAL RECORD NUMBER       CURRENT MEDICATIONS: No current outpatient medications on file. (Ophthalmic Drugs)   No current facility-administered medications for this visit. (Ophthalmic Drugs)   Current Outpatient Medications (Other)  Medication Sig   amoxicillin (AMOXIL) 500 MG capsule amoxicillin 500 mg capsule  TAKE 4 CAPSULES 1 HOUR BEFORE [facility] DENTAL APPOINTMENT (Patient not taking: Reported on 08/02/2020)   Calcium Carbonate-Vitamin D3 600-400 MG-UNIT TABS 1 tablet   Cyanocobalamin (VITAMIN B-12 PO) Take 1 tablet by mouth daily.   FLUoxetine (PROZAC) 20 MG tablet Take 60 mg by mouth daily.   Fluoxetine HCl, PMDD, 20 MG TABS fluoxetine 20 mg tablet  TAKE 3 TABLETS BY MOUTH EVERY DAY   LORazepam (ATIVAN) 0.5 MG tablet lorazepam 0.5 mg tablet   valACYclovir (VALTREX) 500 MG tablet TAKE 1 TABLET BY MOUTH EVERY DAY   No current facility-administered medications for this visit. (Other)      REVIEW OF SYSTEMS:    ALLERGIES No Known Allergies  PAST MEDICAL HISTORY Past Medical History:  Diagnosis Date   Anxiety    Arthritis    Atrophic vaginitis    Detrusor instability    GERD (gastroesophageal reflux disease)    Osteoporosis    Psoriasis    Sciatic pain    Uterine prolapse    Past  Surgical History:  Procedure Laterality Date   APPENDECTOMY     FOOT SURGERY     KNEE SURGERY     TOTAL KNEE ARTHROPLASTY Bilateral    X2-- 2009; 2010   TOTAL KNEE REVISION Left 01/23/2013   Procedure:  TOTAL KNEE ARTHROPLASTY REVISION ;  Surgeon: Loanne Drilling, MD;  Location: [facility];  Service: Orthopedics;  Laterality: Left;    FAMILY HISTORY Family History  Problem Relation Age of Onset   Cancer Mother        GALLBLADDER    Breast cancer Cousin        Mat. 1st cousin Age 43    SOCIAL HISTORY Social History   Tobacco Use   Smoking status: Former    Packs/day: 0.20    Types: Cigarettes    Quit date: 07/25/2015    Years since quitting: 5.7   Smokeless tobacco: Never  Vaping Use   Vaping Use: Never used  Substance Use Topics   Alcohol use: No    Alcohol/week: 0.0 standard drinks   Drug use: No         OPHTHALMIC EXAM:  Base Eye Exam     Visual Acuity (ETDRS)       Right Left   Dist cc CF@3ft   20/30 -2   Dist ph cc NI 20/25 -2    Correction: Glasses         Tonometry (Tonopen, 4:06 PM)       Right Left   Pressure 10 12         Pupils       Pupils Dark Light Shape React APD   Right PERRL 4 3 Round Brisk None   Left PERRL 4 3 Round Brisk None         Visual Fields (Counting fingers)       Left Right    Full Full         Extraocular Movement       Right Left    Full, Ortho Full, Ortho         Neuro/Psych     Oriented x3: Yes   Mood/Affect: Normal         Dilation     Both eyes: 1.0% Mydriacyl, 2.5% Phenylephrine @ 4:06 PM           Slit Lamp and Fundus Exam     External Exam       Right Left   External Normal Normal         Slit Lamp Exam       Right Left   Lids/Lashes Normal Normal   Conjunctiva/Sclera White and quiet White and quiet   Cornea Clear Clear   Anterior Chamber Deep and quiet Deep and quiet   Iris Round and reactive Round and reactive   Lens Posterior chamber intraocular lens    Anterior  Vitreous Normal Normal         Fundus Exam       Right Left   Posterior Vitreous Posterior vitreous detachment Posterior vitreous detachment   Disc Normal Normal   C/D Ratio 0.5 0.5   Macula Geographic atrophy in the FAZ, no membrane, Disciform scar, Advanced age related macular degeneration Early age related macular degeneration, Hard drusen   Vessels Normal Normal   Periphery Normal Normal            IMAGING AND PROCEDURES  Imaging and Procedures for 05/04/21  OCT, Retina - OU - Both Eyes       Right Eye Quality was good. Scan locations included subfoveal. Central Foveal Thickness: 140. Progression has improved. Findings include abnormal foveal contour.   Left Eye Quality was good. Scan locations included subfoveal. Central Foveal Thickness: 258. Progression has been stable. Findings include abnormal foveal contour, retinal drusen .   Notes Massive improvement in macular and pericapillary at anatomy since onset of wet ARMD November 2020 OD.  Diffuse foveal atrophy, no signs of recurrent or persistent CNVM or thickening nasally or in a peripapillary location OD   No sign of CNVM OU             ASSESSMENT/PLAN:  Exudative age-related macular degeneration of right eye with inactive choroidal neovascularization (HCC) Peripapillary and subpapillomacular bundle CNVM has completely resolved now currently follow-up interval of 5 months postinjection Eylea thus 3 months off effective therapy and no sign of recurrence.  Visual acuity limited by subfoveal atrophy  Advanced nonexudative age-related macular degeneration of right eye with subfoveal involvement Previous subfoveal CNVM with choriocapillaris RPE atrophy, no signs of recurrence today.  Of CNVM      ICD-10-CM   1. Exudative age-related macular degeneration of right eye with active choroidal neovascularization (HCC)  H35.3211 OCT, Retina - OU - Both Eyes    CANCELED:  Intravitreal Injection, Pharmacologic  Agent - OD - Right Eye    2. Exudative age-related macular degeneration of right eye with inactive choroidal neovascularization (HCC)  H35.3212     3. Advanced nonexudative age-related macular degeneration of right eye with subfoveal involvement  H35.3114       1.  OD, and active CNVM today now 5 months post most recent injection and now suppressed after over a year and a half of therapy.  We will need to simply observe OD today  2.  I will explained the patient that atrophy limits the acuity and no matter what the future holds the right I the center of the vision is not likely to improve.  3.  Ophthalmic Meds Ordered this visit:  No orders of the defined types were placed in this encounter.      Return in about 4 months (around 09/01/2021) for DILATE OU, OCT, COLOR FP.  There are no Patient Instructions on file for this visit.   Explained the diagnoses, plan, and follow up with the patient and they expressed understanding.  Patient expressed understanding of the importance of proper follow up care.   Alford Highland Koki Buxton M.D. Diseases & Surgery of the Retina and Vitreous [facility] 05/04/21     Abbreviations: M myopia (nearsighted); A astigmatism; H hyperopia (farsighted); P presbyopia; Mrx spectacle prescription;  CTL contact lenses; OD right eye; OS left eye; OU both eyes  XT exotropia; ET esotropia; PEK punctate epithelial keratitis; PEE punctate epithelial erosions; DES dry eye syndrome; MGD meibomian gland dysfunction; ATs artificial tears; PFAT's preservative free artificial tears; NSC nuclear sclerotic cataract; PSC posterior subcapsular cataract; ERM epi-retinal membrane; PVD posterior vitreous detachment; RD retinal detachment; DM diabetes mellitus; DR diabetic retinopathy; NPDR non-proliferative diabetic retinopathy; PDR proliferative diabetic retinopathy; CSME clinically significant macular edema; DME diabetic macular edema; dbh dot blot hemorrhages; CWS  cotton wool spot; POAG primary open angle glaucoma; C/D cup-to-disc ratio; HVF humphrey visual field; GVF goldmann visual field; OCT optical coherence tomography; IOP intraocular pressure; BRVO Branch retinal vein occlusion; CRVO central retinal vein occlusion; CRAO central retinal artery occlusion; BRAO branch retinal artery occlusion; RT retinal tear; SB scleral buckle; PPV pars plana vitrectomy; VH Vitreous hemorrhage; PRP panretinal laser photocoagulation; IVK intravitreal kenalog; VMT vitreomacular traction; MH Macular hole;  NVD neovascularization of the disc; NVE neovascularization elsewhere; AREDS age related eye disease study; ARMD age related macular degeneration; POAG primary open angle glaucoma; EBMD epithelial/anterior basement membrane dystrophy; ACIOL anterior chamber intraocular lens; IOL intraocular lens; PCIOL posterior chamber intraocular lens; Phaco/IOL phacoemulsification with intraocular lens placement; PRK photorefractive keratectomy; LASIK laser assisted in situ keratomileusis; HTN hypertension; DM diabetes mellitus; COPD chronic obstructive pulmonary disease

## 2021-05-17 ENCOUNTER — Ambulatory Visit: Payer: Medicare PPO | Admitting: Nurse Practitioner

## 2021-05-23 ENCOUNTER — Ambulatory Visit (INDEPENDENT_AMBULATORY_CARE_PROVIDER_SITE_OTHER): Payer: Medicare PPO | Admitting: Nurse Practitioner

## 2021-05-23 ENCOUNTER — Other Ambulatory Visit: Payer: Self-pay

## 2021-05-23 ENCOUNTER — Encounter: Payer: Self-pay | Admitting: Nurse Practitioner

## 2021-05-23 VITALS — BP 130/70 | Ht 66.0 in | Wt 215.0 lb

## 2021-05-23 DIAGNOSIS — M8589 Other specified disorders of bone density and structure, multiple sites: Secondary | ICD-10-CM

## 2021-05-23 DIAGNOSIS — Z9189 Other specified personal risk factors, not elsewhere classified: Secondary | ICD-10-CM

## 2021-05-23 DIAGNOSIS — Z4689 Encounter for fitting and adjustment of other specified devices: Secondary | ICD-10-CM | POA: Diagnosis not present

## 2021-05-23 DIAGNOSIS — Z780 Asymptomatic menopausal state: Secondary | ICD-10-CM

## 2021-05-23 DIAGNOSIS — N813 Complete uterovaginal prolapse: Secondary | ICD-10-CM | POA: Diagnosis not present

## 2021-05-23 DIAGNOSIS — Z01419 Encounter for gynecological examination (general) (routine) without abnormal findings: Secondary | ICD-10-CM

## 2021-05-23 NOTE — Progress Notes (Signed)
[name] [birth year] [id number]   History:  80 y.o. G3P3004 presents for breast and pelvic exam. Postmenopausal - no HRT, no bleeding. Using gelhorn pessary for complete uterine prolapse with good management. She is not able to remove herself. Normal pap history.  Gynecologic History No LMP recorded. Patient is postmenopausal.   Contraception: post menopausal status Sexually active: No  Health Maintenance Last Pap: 11/22/2015. Results were: Normal Last mammogram: 05/2020. Results were: Normal Last colonoscopy: 2019. Results were: Normal Last Dexa: 12/25/2018. Results were: T-score -1.5, FRAX 5.4% / 1.1%  Past medical history, past surgical history, family history and social history were all reviewed and documented in the EPIC chart. Married. 4 children (3 live here, 1 in [place]). 9 grandchildren. Retired from [employer].   ROS:  A ROS was performed and pertinent positives and negatives are included.  Exam:  Vitals:   05/23/21 0935  BP: 130/70  Weight: 215 lb (97.5 kg)  Height: 5\' 6"  (1.676 m)   Body mass index is 34.7 kg/m.  General appearance:  Normal Thyroid:  Symmetrical, normal in size, without palpable masses or nodularity. Respiratory  Auscultation:  Clear without wheezing or rhonchi Cardiovascular  Auscultation:  Regular rate, without rubs, murmurs or gallops  Edema/varicosities:  Not grossly evident Abdominal  Soft,nontender, without masses, guarding or rebound.  Liver/spleen:  No organomegaly noted  Hernia:  None appreciated  Skin  Inspection:  Grossly normal Breasts: Examined lying and sitting.   Right: Without masses, retractions, nipple discharge or axillary adenopathy.   Left: Without masses, retractions, nipple discharge or axillary adenopathy. Genitourinary   Inguinal/mons:  Normal without inguinal adenopathy  External genitalia:  Normal appearing vulva with no masses, tenderness, or lesions  BUS/Urethra/Skene's glands:  Normal  Vagina:  Normal  appearing with normal color and discharge, no lesions. Atrophic changes  Cervix:  Normal appearing without discharge or lesions  Uterus:  Prolapsed. Gelhorn pessary removed by grasping horn with ring forceps and breaking suction with fingers. Patient tolerated well.   Adnexa/parametria:     Rt: Normal in size, without masses or tenderness.   Lt: Normal in size, without masses or tenderness.  Anus and perineum: Normal  Digital rectal exam: Normal sphincter tone without palpated masses or tenderness  Patient informed chaperone available to be present for breast and pelvic exam. Patient has requested no chaperone to be present. Patient has been advised what will be completed during breast and pelvic exam.   Assessment/Plan:  80 y.o. 94 for breast and pelvic exam.   Well female exam with routine gynecological exam - Education provided on SBEs, importance of preventative screenings, current guidelines, high calcium diet, regular exercise, and multivitamin daily.  Labs with PCP.   Postmenopausal - Plan: DG Bone Density.  No HRT, no bleeding.   Osteopenia of multiple sites - Plan: DG Bone Density. T-score -1.5 without elevated FRAX. Will schedule DXA now. Continue Vitamin D + Calcium and increase exercise.   Complete uterine prolapse - Gelhorn pessary with good management.   Encounter for pessary maintenance - Gelhorn pessary removed with some difficulty. Horn grasped with ring forceps and fingers used to break suction. She tolerated well. Patient plans to reinsert in a few days. Has trouble removing and will plan to return in 3-4 months for maintenance.   Screening for cervical cancer - Normal Pap history. No longer screening per guidelines.   Screening for breast cancer - Normal mammogram history.  Continue annual screenings.  Normal breast exam today.  Screening for  colon cancer - 2019 colonoscopy. Screenings no longer recommended per GI.   Return in 2 years for breast and pelvic exam.    [name] DNP, 10:34 AM 05/23/2021

## 2021-05-24 ENCOUNTER — Other Ambulatory Visit: Payer: Self-pay

## 2021-05-24 ENCOUNTER — Emergency Department (HOSPITAL_COMMUNITY): Payer: Medicare PPO

## 2021-05-24 ENCOUNTER — Encounter (HOSPITAL_COMMUNITY): Payer: Self-pay | Admitting: Oncology

## 2021-05-24 ENCOUNTER — Emergency Department (HOSPITAL_COMMUNITY)
Admission: EM | Admit: 2021-05-24 | Discharge: 2021-05-24 | Disposition: A | Discharge status: Home / Self Care | Payer: Medicare PPO | Attending: Emergency Medicine | Admitting: Emergency Medicine

## 2021-05-24 DIAGNOSIS — G319 Degenerative disease of nervous system, unspecified: Secondary | ICD-10-CM | POA: Diagnosis not present

## 2021-05-24 DIAGNOSIS — S0181XA Laceration without foreign body of other part of head, initial encounter: Secondary | ICD-10-CM | POA: Insufficient documentation

## 2021-05-24 DIAGNOSIS — Z96653 Presence of artificial knee joint, bilateral: Secondary | ICD-10-CM | POA: Diagnosis not present

## 2021-05-24 DIAGNOSIS — W010XXA Fall on same level from slipping, tripping and stumbling without subsequent striking against object, initial encounter: Secondary | ICD-10-CM | POA: Insufficient documentation

## 2021-05-24 DIAGNOSIS — S0101XA Laceration without foreign body of scalp, initial encounter: Secondary | ICD-10-CM | POA: Diagnosis not present

## 2021-05-24 DIAGNOSIS — G9389 Other specified disorders of brain: Secondary | ICD-10-CM | POA: Diagnosis not present

## 2021-05-24 DIAGNOSIS — Z87891 Personal history of nicotine dependence: Secondary | ICD-10-CM | POA: Diagnosis not present

## 2021-05-24 DIAGNOSIS — S0990XA Unspecified injury of head, initial encounter: Secondary | ICD-10-CM | POA: Diagnosis not present

## 2021-05-24 DIAGNOSIS — W19XXXA Unspecified fall, initial encounter: Secondary | ICD-10-CM

## 2021-05-24 DIAGNOSIS — S098XXA Other specified injuries of head, initial encounter: Secondary | ICD-10-CM | POA: Diagnosis present

## 2021-05-24 NOTE — Discharge Instructions (Signed)
Return for any problem.  Your laceration was repaired with Dermabond.  This is a skin adhesive.  The skin adhesive will fall off on its own after about 1 week.

## 2021-05-24 NOTE — ED Triage Notes (Signed)
Pt had a witnessed mechanical fall at [employer] resulting in a laceration to left forehead.

## 2021-05-24 NOTE — ED Provider Notes (Signed)
Emergency Medicine Provider Triage Evaluation Note  [name] , a 80 y.o. female  was evaluated in triage.  Pt complains of fall. States she tripped pta and hit her head. Denies loc. C/o wound to the forehead.  Review of Systems  Positive: Head injury Negative: loc  Physical Exam  BP (!) 155/67 (BP Location: Left Arm)   Pulse 82   Temp 97.8 F (36.6 C) (Oral)   Resp 17   Ht 5\' 8"  (1.727 m)   Wt 90.7 kg   SpO2 100%   BMI 30.41 kg/m  Gen:   Awake, no distress   Resp:  Normal effort  MSK:   Moves extremities without difficulty  Other:  Clear speech, no facial droop  Medical Decision Making  Medically screening exam initiated at 1:34 PM.  Appropriate orders placed.  [name] was informed that the remainder of the evaluation will be completed by another provider, this initial triage assessment does not replace that evaluation, and the importance of remaining in the ED until their evaluation is complete.     , PA-C 05/24/21 1334    14/07/22, MD 05/25/21 [phone]

## 2021-05-24 NOTE — ED Provider Notes (Signed)
[facility]-EMERGENCY DEPT Provider Note   CSN: [id number] Arrival date & time: 05/24/21  1305     History Chief Complaint  Patient presents with   [name] is a 80 y.o. female.  80 year old female with prior medical history as detailed below presents for evaluation.  She had a trip and fall while shopping.  She did strike her forehead.  She has a laceration to the left lateral forehead.  She did not pass out.  She denies other injury.  She denies neck pain.  She is ambulatory post the fall without difficulty.  The history is provided by the patient.  Fall This is a new problem. The current episode started 1 to 2 hours ago. The problem occurs constantly. The problem has not changed since onset.Pertinent negatives include no chest pain and no abdominal pain. Nothing aggravates the symptoms. Nothing relieves the symptoms.      Past Medical History:  Diagnosis Date   Achilles rupture    Anxiety    Arthritis    Atrophic vaginitis    Detrusor instability    GERD (gastroesophageal reflux disease)    Osteoporosis    Psoriasis    Sciatic pain    Uterine prolapse     Patient Active Problem List   Diagnosis Date Noted   Advanced nonexudative age-related macular degeneration of right eye with subfoveal involvement 05/04/2021   Early stage nonexudative age-related macular degeneration of left eye 12/01/2020   Nuclear sclerotic cataract of left eye 02/25/2020   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   Exudative age-related macular degeneration of right eye with inactive choroidal neovascularization (HCC) 09/24/2019   Choroidal neovascularization of right eye 09/24/2019   Retinal hemorrhage of right eye 09/24/2019   Retinal exudates and deposits 09/24/2019   Cystoid macular edema of right eye 09/24/2019   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   Mastodynia, female 03/19/2016   Cystocele 02/08/2014   Vaginal  atrophy 01/28/2014   History of herpes simplex infection 01/28/2014   Knee pain, acute 03/05/2013   Anxiety 02/05/2013   Constipation 02/05/2013   Depression 02/05/2013   Psoriasis 02/05/2013   Failed total knee arthroplasty (HCC) 01/23/2013   Sciatic pain    Detrusor instability    Uterine prolapse    Atrophic vaginitis    Osteoporosis     Past Surgical History:  Procedure Laterality Date   APPENDECTOMY     FOOT SURGERY     KNEE SURGERY     TOTAL KNEE ARTHROPLASTY Bilateral    X2-- 2009; 2010   TOTAL KNEE REVISION Left 01/23/2013   Procedure:  TOTAL KNEE ARTHROPLASTY REVISION ;  Surgeon: Loanne Drilling, MD;  Location: [facility];  Service: Orthopedics;  Laterality: Left;     OB History     Gravida  3   Para  3   Term  3   Preterm      AB      Living  4      SAB      IAB      Ectopic      Multiple  1   Live Births              Family History  Problem Relation Age of Onset   Cancer Mother        GALLBLADDER    Breast cancer Cousin        Mat. 1st cousin Age 76    Social History  Tobacco Use   Smoking status: Former    Packs/day: 0.20    Types: Cigarettes    Quit date: 07/25/2015    Years since quitting: 5.8   Smokeless tobacco: Never  Vaping Use   Vaping Use: Never used  Substance Use Topics   Alcohol use: No    Alcohol/week: 0.0 standard drinks   Drug use: No    Home Medications Prior to Admission medications   Medication Sig Start Date End Date Taking? Authorizing Provider  amoxicillin (AMOXIL) 500 MG capsule amoxicillin 500 mg capsule  TAKE 4 CAPSULES 1 HOUR BEFORE [facility] DENTAL APPOINTMENT Patient not taking: Reported on 08/02/2020 06/30/12   [provider]  Calcium Carbonate-Vitamin D3 600-400 MG-UNIT TABS 1 tablet    [provider]  Cyanocobalamin (VITAMIN B-12 PO) Take 1 tablet by mouth daily.    [provider]  FLUoxetine (PROZAC) 20 MG tablet Take 60 mg by mouth daily.    [provider]   Fluoxetine HCl, PMDD, 20 MG TABS fluoxetine 20 mg tablet  TAKE 3 TABLETS BY MOUTH EVERY DAY    [provider]  LORazepam (ATIVAN) 0.5 MG tablet  01/29/13   [provider]  valACYclovir (VALTREX) 500 MG tablet TAKE 1 TABLET BY MOUTH EVERY DAY 02/23/21   Genia Del, MD    Allergies    Patient has no known allergies.  Review of Systems   Review of Systems  Cardiovascular:  Negative for chest pain.  Gastrointestinal:  Negative for abdominal pain.  All other systems reviewed and are negative.  Physical Exam Updated Vital Signs BP (!) 142/87   Pulse 87   Temp 97.8 F (36.6 C) (Oral)   Resp 18   Ht  (1.727 m)   Wt 90.7 kg   SpO2 99%   BMI 30.41 kg/m   Physical Exam Vitals and nursing note reviewed.  Constitutional:      General: She is not in acute distress.    Appearance: Normal appearance. She is well-developed.  HENT:     Head: Normocephalic.     Comments: 1.5 cm laceration to the left lateral forehead Eyes:     Conjunctiva/sclera: Conjunctivae normal.     Pupils: Pupils are equal, round, and reactive to light.  Cardiovascular:     Rate and Rhythm: Normal rate and regular rhythm.     Heart sounds: Normal heart sounds.  Pulmonary:     Effort: Pulmonary effort is normal. No respiratory distress.     Breath sounds: Normal breath sounds.  Abdominal:     General: There is no distension.     Palpations: Abdomen is soft.     Tenderness: There is no abdominal tenderness.  Musculoskeletal:        General: No deformity. Normal range of motion.     Cervical back: Normal range of motion and neck supple.  Skin:    General: Skin is warm and dry.  Neurological:     General: No focal deficit present.     Mental Status: She is alert and oriented to person, place, and time.    ED Results / Procedures / Treatments   Labs (all labs ordered are listed, but only abnormal results are displayed) Labs Reviewed - No data to  display  EKG None  Radiology CT Head Wo Contrast  Result Date: 05/24/2021 CLINICAL DATA:  Fall, head trauma EXAM: CT HEAD WITHOUT CONTRAST TECHNIQUE: Contiguous axial images were obtained from the base of the skull through the vertex  without intravenous contrast. COMPARISON:  Head CT 07/14/2007 FINDINGS: Brain: There is no evidence of acute intracranial hemorrhage, extra-axial fluid collection, or acute infarct. There is mild global parenchymal volume loss with commensurate enlargement of the ventricular system. Patchy hypodensity in the subcortical and periventricular white matter likely reflects sequela of chronic white matter microangiopathy. There is no solid mass lesion.  There is no midline shift. Vascular: There is calcification of the bilateral cavernous ICAs. Skull: Normal. Negative for fracture or focal lesion. Sinuses/Orbits: The imaged paranasal sinuses are clear. A right lens implant is noted. The globes and orbits are otherwise unremarkable. Other: None. IMPRESSION: 1. No acute intracranial pathology. 2. Mild chronic white matter microangiopathy and global parenchymal volume loss. Cerebral Atrophy (ICD10-G31.9). Electronically Signed   By: Lesia Hausen M.D.   On: 05/24/2021 15:51    Procedures .Marland KitchenLaceration Repair  Date/Time: 05/24/2021 10:42 PM Performed by: Wynetta Fines, MD Authorized by: Wynetta Fines, MD   Consent:    Consent obtained:  Verbal   Consent given by:  Patient   Risks discussed:  Infection, need for additional repair, nerve damage, poor wound healing, poor cosmetic result, pain, retained foreign body, tendon damage and vascular damage   Alternatives discussed:  No treatment Universal protocol:    Immediately prior to procedure, a time out was called: yes     Patient identity confirmed:  Verbally with patient Anesthesia:    Anesthesia method:  None Laceration details:    Location:  Scalp   Length (cm):  1.5 Pre-procedure details:    Preparation:   Imaging obtained to evaluate for foreign bodies Exploration:    Hemostasis achieved with:  Direct pressure   Imaging outcome: foreign body not noted     Contaminated: no   Treatment:    Area cleansed with:  Saline   Amount of cleaning:  Standard Skin repair:    Repair method:  Tissue adhesive Approximation:    Approximation:  Close Repair type:    Repair type:  Simple Post-procedure details:    Dressing:  Open (no dressing)   Procedure completion:  Tolerated well, no immediate complications   Medications Ordered in ED Medications - No data to display  ED Course  I have reviewed the triage vital signs and the nursing notes.  Pertinent labs & imaging results that were available during my care of the patient were reviewed by me and considered in my medical decision making (see chart for details).    MDM Rules/Calculators/A&P                           MDM  MSE complete  [name] was evaluated in Emergency Department on 05/24/2021 for the symptoms described in the history of present illness. She was evaluated in the context of the global COVID-19 pandemic, which necessitated consideration that the patient might be at risk for infection with the SARS-CoV-2 virus that causes COVID-19. Institutional protocols and algorithms that pertain to the evaluation of patients at risk for COVID-19 are in a state of rapid change based on information released by regulatory bodies including the CDC and federal and state organizations. These policies and algorithms were followed during the patient's care in the ED.  Patient presented for evaluation following trip and fall.  She did strike her head.  Obtained imaging is without acute pathology.  Laceration to the left forehead repaired easily with Dermabond.  Patient does understand need for close follow-up.  Strict  return precautions - head injury precautions - given and understood.  Final Clinical Impression(s) / ED Diagnoses Final  diagnoses:  Fall, initial encounter  Injury of head, initial encounter    Rx / DC Orders ED Discharge Orders     None        Wynetta Fines, MD 05/24/21 2243

## 2021-05-24 NOTE — ED Notes (Signed)
An After Visit Summary was printed and given to the patient. Discharge instructions given and no further questions at this time.  Pt leaving with her husband.  

## 2021-07-04 ENCOUNTER — Ambulatory Visit: Payer: Medicare PPO | Admitting: Nurse Practitioner

## 2021-07-13 DIAGNOSIS — L639 Alopecia areata, unspecified: Secondary | ICD-10-CM | POA: Diagnosis not present

## 2021-07-13 DIAGNOSIS — D485 Neoplasm of uncertain behavior of skin: Secondary | ICD-10-CM | POA: Diagnosis not present

## 2021-07-17 DIAGNOSIS — R269 Unspecified abnormalities of gait and mobility: Secondary | ICD-10-CM | POA: Diagnosis not present

## 2021-07-17 DIAGNOSIS — Z79899 Other long term (current) drug therapy: Secondary | ICD-10-CM | POA: Diagnosis not present

## 2021-07-17 DIAGNOSIS — M4696 Unspecified inflammatory spondylopathy, lumbar region: Secondary | ICD-10-CM | POA: Diagnosis not present

## 2021-07-17 DIAGNOSIS — M169 Osteoarthritis of hip, unspecified: Secondary | ICD-10-CM | POA: Diagnosis not present

## 2021-07-17 DIAGNOSIS — W19XXXA Unspecified fall, initial encounter: Secondary | ICD-10-CM | POA: Diagnosis not present

## 2021-07-28 DIAGNOSIS — L638 Other alopecia areata: Secondary | ICD-10-CM | POA: Diagnosis not present

## 2021-08-01 DIAGNOSIS — I872 Venous insufficiency (chronic) (peripheral): Secondary | ICD-10-CM | POA: Diagnosis not present

## 2021-08-21 ENCOUNTER — Other Ambulatory Visit: Payer: Self-pay | Admitting: Obstetrics & Gynecology

## 2021-08-22 NOTE — Telephone Encounter (Signed)
Last AEX 05/23/21--note to return in two years for AEX.  ?

## 2021-08-28 DIAGNOSIS — Z1231 Encounter for screening mammogram for malignant neoplasm of breast: Secondary | ICD-10-CM | POA: Diagnosis not present

## 2021-09-04 ENCOUNTER — Encounter (INDEPENDENT_AMBULATORY_CARE_PROVIDER_SITE_OTHER): Payer: Medicare PPO | Admitting: Ophthalmology

## 2021-09-11 ENCOUNTER — Ambulatory Visit (INDEPENDENT_AMBULATORY_CARE_PROVIDER_SITE_OTHER): Payer: Medicare PPO | Admitting: Ophthalmology

## 2021-09-11 ENCOUNTER — Other Ambulatory Visit: Payer: Self-pay

## 2021-09-11 ENCOUNTER — Encounter (INDEPENDENT_AMBULATORY_CARE_PROVIDER_SITE_OTHER): Payer: Self-pay | Admitting: Ophthalmology

## 2021-09-11 DIAGNOSIS — H353114 Nonexudative age-related macular degeneration, right eye, advanced atrophic with subfoveal involvement: Secondary | ICD-10-CM

## 2021-09-11 DIAGNOSIS — H353211 Exudative age-related macular degeneration, right eye, with active choroidal neovascularization: Secondary | ICD-10-CM | POA: Diagnosis not present

## 2021-09-11 DIAGNOSIS — H2512 Age-related nuclear cataract, left eye: Secondary | ICD-10-CM | POA: Diagnosis not present

## 2021-09-11 DIAGNOSIS — H318 Other specified disorders of choroid: Secondary | ICD-10-CM | POA: Diagnosis not present

## 2021-09-11 DIAGNOSIS — H3561 Retinal hemorrhage, right eye: Secondary | ICD-10-CM | POA: Diagnosis not present

## 2021-09-11 DIAGNOSIS — L308 Other specified dermatitis: Secondary | ICD-10-CM | POA: Diagnosis not present

## 2021-09-11 DIAGNOSIS — H353121 Nonexudative age-related macular degeneration, left eye, early dry stage: Secondary | ICD-10-CM

## 2021-09-11 DIAGNOSIS — L638 Other alopecia areata: Secondary | ICD-10-CM | POA: Diagnosis not present

## 2021-09-11 NOTE — Assessment & Plan Note (Signed)
No sign of CNVM OS 

## 2021-09-11 NOTE — Assessment & Plan Note (Signed)
Follow-up Dr. Harlon Flor as scheduled ? ?Probably accounts for refractive error change ?

## 2021-09-11 NOTE — Assessment & Plan Note (Signed)
Condition OD has cleared ?

## 2021-09-11 NOTE — Assessment & Plan Note (Signed)
In the past OD, but no residual active disease ?

## 2021-09-11 NOTE — Assessment & Plan Note (Signed)
Residual atrophy accounts for acuity now OD ?

## 2021-09-11 NOTE — Progress Notes (Signed)
? ? ?09/11/2021 ? ?  ? ?CHIEF COMPLAINT ?Patient presents for  ?Chief Complaint  ?Patient presents with  ? Macular Degeneration  ? ? ? ? ?HISTORY OF PRESENT ILLNESS: ?[name] is a 81 y.o. female who presents to the clinic today for:  ? ?HPI   ?4 months dilate OU, OCT, Color FP. ?Patient states vision is stable and unchanged since last visit. Denies any new floaters or FOL. ?Pt has a question about her not getting the injections in her eye anymore, is wondering if that is good.  ?Last edited by Nelva Nay on 09/11/2021  1:26 PM.  ?  ? ? ?Referring physician: ?Kirby Funk, MD ?[address] ?[address] ?[place],  [place] [address] ? ?HISTORICAL INFORMATION:  ? ?Selected notes from the MEDICAL RECORD NUMBER ?  ?   ? ?CURRENT MEDICATIONS: ?No current outpatient medications on file. (Ophthalmic Drugs)  ? ?No current facility-administered medications for this visit. (Ophthalmic Drugs)  ? ?Current Outpatient Medications (Other)  ?Medication Sig  ? amoxicillin (AMOXIL) 500 MG capsule amoxicillin 500 mg capsule ? TAKE 4 CAPSULES 1 HOUR BEFORE [facility] DENTAL APPOINTMENT (Patient not taking: Reported on 08/02/2020)  ? Calcium Carbonate-Vitamin D3 600-400 MG-UNIT TABS 1 tablet  ? Cyanocobalamin (VITAMIN B-12 PO) Take 1 tablet by mouth daily.  ? FLUoxetine (PROZAC) 20 MG tablet Take 60 mg by mouth daily.  ? Fluoxetine HCl, PMDD, 20 MG TABS fluoxetine 20 mg tablet ? TAKE 3 TABLETS BY MOUTH EVERY DAY  ? LORazepam (ATIVAN) 0.5 MG tablet   ? valACYclovir (VALTREX) 500 MG tablet TAKE 1 TABLET BY MOUTH EVERY DAY  ? ?No current facility-administered medications for this visit. (Other)  ? ? ? ? ?REVIEW OF SYSTEMS: ?ROS   ?Negative for: Constitutional, Gastrointestinal, Neurological, Skin, Genitourinary, Musculoskeletal, HENT, Endocrine, Cardiovascular, Eyes, Respiratory, Psychiatric, Allergic/Imm, Heme/Lymph ?Last edited by Edmon Crape, MD on 09/11/2021  1:59 PM.  ?  ? ? ? ?ALLERGIES ?No Known Allergies ? ?PAST MEDICAL  HISTORY ?Past Medical History:  ?Diagnosis Date  ? Achilles rupture   ? Anxiety   ? Arthritis   ? Atrophic vaginitis   ? Choroidal neovascularization of right eye 09/24/2019  ? Cystoid macular edema of right eye 09/24/2019  ? Detrusor instability   ? GERD (gastroesophageal reflux disease)   ? Osteoporosis   ? Psoriasis   ? Sciatic pain   ? Uterine prolapse   ? ?Past Surgical History:  ?Procedure Laterality Date  ? APPENDECTOMY    ? FOOT SURGERY    ? KNEE SURGERY    ? TOTAL KNEE ARTHROPLASTY Bilateral   ? X2-- 2009; 2010  ? TOTAL KNEE REVISION Left 01/23/2013  ? Procedure:  TOTAL KNEE ARTHROPLASTY REVISION ;  Surgeon: Loanne Drilling, MD;  Location: [facility];  Service: Orthopedics;  Laterality: Left;  ? ? ?FAMILY HISTORY ?Family History  ?Problem Relation Age of Onset  ? Cancer Mother   ?     GALLBLADDER   ? Breast cancer Cousin   ?     Mat. 1st cousin Age 69  ? ? ?SOCIAL HISTORY ?Social History  ? ?Tobacco Use  ? Smoking status: Former  ?  Packs/day: 0.20  ?  Types: Cigarettes  ?  Quit date: 07/25/2015  ?  Years since quitting: 6.1  ? Smokeless tobacco: Never  ?Vaping Use  ? Vaping Use: Never used  ?Substance Use Topics  ? Alcohol use: No  ?  Alcohol/week: 0.0 standard drinks  ? Drug use:  No  ? ?  ? ?  ? ?OPHTHALMIC EXAM: ? ?Base Eye Exam   ? ? Visual Acuity (ETDRS)   ? ?   Right Left  ? Dist cc CF at 3' 20/50 -2+2  ? Dist ph cc NI 20/40 +2  ? ?  ?  ? ? Tonometry (Tonopen, 1:30 PM)   ? ?   Right Left  ? Pressure  8  ? ? Unable to assess: Yes  ?Unable OD, patient squeezing eyes closed. ? ?  ?  ? ? Pupils   ? ?   Pupils Dark Light APD  ? Right PERRL 3 2 None  ? Left PERRL 3 2 None  ? ?  ?  ? ? Visual Fields   ? ?   Left Right  ?  Full   ? Restrictions  Partial inner superior temporal, inferior temporal, superior nasal, inferior nasal deficiencies  ? ?  ?  ? ? Extraocular Movement   ? ?   Right Left  ?  Full Full  ? ?  ?  ? ? Neuro/Psych   ? ? Oriented x3: Yes  ? Mood/Affect: Normal  ? ?  ?  ? ? Dilation   ? ? Both eyes: 1.0%  Mydriacyl, 2.5% Phenylephrine @ 1:30 PM  ? ?  ?  ? ?  ? ?Slit Lamp and Fundus Exam   ? ? External Exam   ? ?   Right Left  ? External Normal Normal  ? ?  ?  ? ? Slit Lamp Exam   ? ?   Right Left  ? Lids/Lashes Normal Normal  ? Conjunctiva/Sclera White and quiet White and quiet  ? Cornea Clear Clear  ? Anterior Chamber Deep and quiet Deep and quiet  ? Iris Round and reactive Round and reactive  ? Lens Posterior chamber intraocular lens 2+ Nuclear sclerosis  ? Anterior Vitreous Normal Normal  ? ?  ?  ? ? Fundus Exam   ? ?   Right Left  ? Posterior Vitreous Posterior vitreous detachment Posterior vitreous detachment  ? Disc Normal Normal  ? C/D Ratio 0.5 0.5  ? Macula Geographic atrophy in the FAZ, no membrane, Disciform scar, Advanced age related macular degeneration Early age related macular degeneration, Hard drusen  ? Vessels Normal Normal  ? Periphery Normal Normal  ? ?  ?  ? ?  ? ? ?IMAGING AND PROCEDURES  ?Imaging and Procedures for 09/11/21 ? ?Color Fundus Photography Optos - OU - Both Eyes   ? ?   ?Right Eye ?Progression has been stable. Disc findings include normal observations. Macula : geographic atrophy. Vessels : normal observations. Periphery : normal observations.  ? ?Left Eye ?Progression has been stable. Macula : drusen. Vessels : normal observations. Periphery : normal observations.  ? ?Notes ?Incidental PVD OD and OS.  No active hemorrhage no active sign of CNVM OU ? ?  ? ?OCT, Retina - OU - Both Eyes   ? ?   ?Right Eye ?Quality was good. Scan locations included subfoveal. Central Foveal Thickness: 129. Progression has improved. Findings include abnormal foveal contour.  ? ?Left Eye ?Quality was good. Scan locations included subfoveal. Central Foveal Thickness: 259. Progression has been stable. Findings include abnormal foveal contour, retinal drusen .  ? ?Notes ?Massive improvement in macular and pericapillary at anatomy since onset of wet ARMD November 2020 OD.  Diffuse foveal atrophy, no signs of  recurrent or persistent CNVM or thickening nasally or in a  peripapillary location OD ? ? ?No sign of CNVM OU ? ?  ? ? ?  ?  ? ?  ?ASSESSMENT/PLAN: ? ?Retinal hemorrhage of right eye ?Condition OD has cleared ? ?Nuclear sclerotic cataract of left eye ?Follow-up Dr. Harlon Flor as scheduled ? ?Probably accounts for refractive error change ? ?Idiopathic polypoidal choroidal vasculopathy ?In the past OD, but no residual active disease ? ?Early stage nonexudative age-related macular degeneration of left eye ?No sign of CNVM OS ? ?Advanced nonexudative age-related macular degeneration of right eye with subfoveal involvement ?Residual atrophy accounts for acuity now OD  ? ?  ICD-10-CM   ?1. Exudative age-related macular degeneration of right eye with active choroidal neovascularization (HCC)  H35.3211 Color Fundus Photography Optos - OU - Both Eyes  ?  OCT, Retina - OU - Both Eyes  ?  ?2. Retinal hemorrhage of right eye  H35.61   ?  ?3. Nuclear sclerotic cataract of left eye  H25.12   ?  ?4. Idiopathic polypoidal choroidal vasculopathy  H31.8   ?  ?5. Early stage nonexudative age-related macular degeneration of left eye  H35.3121   ?  ?6. Advanced nonexudative age-related macular degeneration of right eye with subfoveal involvement  H35.3114   ?  ? ? ?1.  OD, geographic atrophy limits acuity from prior advanced subretinal hemorrhage with CNVM from polypoidal.  Condition resolved.  No intravitreal injection required for the last 9 months OD. ? ?2.  OS no sign of CNVM ? ?3.  OS progressive NSC changes ? ?Ophthalmic Meds Ordered this visit:  ?No orders of the defined types were placed in this encounter. ? ? ?  ? ?Return in about 6 months (around 03/14/2022) for DILATE OU, COLOR FP, OCT. ? ?There are no Patient Instructions on file for this visit. ? ? ?Explained the diagnoses, plan, and follow up with the patient and they expressed understanding.  Patient expressed understanding of the importance of proper follow up care.  ? ?Alford Highland. Jamy Cleckler M.D. ?Diseases & Surgery of the Retina and Vitreous ?[facility] ?09/11/21 ? ? ? ? ?Abbreviations: ?M myopia (nearsighted); A astigmatism; H hyperopia (farsighted); P presbyopia;

## 2021-10-27 DIAGNOSIS — R102 Pelvic and perineal pain: Secondary | ICD-10-CM | POA: Diagnosis not present

## 2021-10-27 DIAGNOSIS — M545 Low back pain, unspecified: Secondary | ICD-10-CM | POA: Diagnosis not present

## 2021-10-30 ENCOUNTER — Ambulatory Visit: Payer: Medicare PPO | Admitting: Nurse Practitioner

## 2021-10-30 ENCOUNTER — Encounter: Payer: Self-pay | Admitting: Nurse Practitioner

## 2021-10-30 VITALS — BP 148/84

## 2021-10-30 DIAGNOSIS — N813 Complete uterovaginal prolapse: Secondary | ICD-10-CM | POA: Diagnosis not present

## 2021-10-30 DIAGNOSIS — Z4689 Encounter for fitting and adjustment of other specified devices: Secondary | ICD-10-CM

## 2021-10-30 NOTE — Progress Notes (Signed)
? ?  Acute Office Visit ? ?Subjective:  ? ? Patient ID: [name], female    DOB: 1940-09-13, 81 y.o.   MRN: [id number] ? ? ?HPI ?81 y.o. presents today for pessary maintenance. Has Gelhorn in place,unable to remove herself. She likes to keep out for a few days and will insert herself. No discharge or bleeding. Good management.  ? ? ?Review of Systems  ?Constitutional: Negative.   ?Genitourinary: Negative.   ? ?   ?Objective:  ?  ?Physical Exam ?Constitutional:   ?   Appearance: Normal appearance.  ?Genitourinary: ?   General: Normal vulva.  ?   Vagina: No signs of injury. No vaginal discharge, erythema, tenderness or bleeding.  ?   Comments: Gelhorn in place, removed ? ? ?BP (!) 148/84  ?Wt Readings from Last 3 Encounters:  ?05/24/21 200 lb (90.7 kg)  ?05/23/21 215 lb (97.5 kg)  ?08/02/20 215 lb (97.5 kg)  ? ? ?   ? ?Patient informed chaperone available to be present for breast and pelvic exam. Patient has requested no chaperone to be present. Patient has been advised what will be completed during breast and pelvic exam.  ? ?Assessment & Plan:  ? ?Problem List Items Addressed This Visit   ?None ?Visit Diagnoses   ? ? Encounter for pessary maintenance    -  Primary  ? Complete uterine prolapse      ? ?  ? ?Plan: Pessary removed, cleaned, and provided back to patient. She will return in 3-6 months for maintenance.  ? ? ? ? ?[name] DNP, 10:20 AM 10/30/2021 ? ? ? ? ? ? ?

## 2021-11-06 DIAGNOSIS — M5451 Vertebrogenic low back pain: Secondary | ICD-10-CM | POA: Diagnosis not present

## 2021-11-23 DIAGNOSIS — L638 Other alopecia areata: Secondary | ICD-10-CM | POA: Diagnosis not present

## 2021-11-23 DIAGNOSIS — L308 Other specified dermatitis: Secondary | ICD-10-CM | POA: Diagnosis not present

## 2021-11-29 DIAGNOSIS — J3489 Other specified disorders of nose and nasal sinuses: Secondary | ICD-10-CM | POA: Diagnosis not present

## 2021-11-29 DIAGNOSIS — U071 COVID-19: Secondary | ICD-10-CM | POA: Diagnosis not present

## 2021-11-29 DIAGNOSIS — J029 Acute pharyngitis, unspecified: Secondary | ICD-10-CM | POA: Diagnosis not present

## 2021-12-25 DIAGNOSIS — M5459 Other low back pain: Secondary | ICD-10-CM | POA: Diagnosis not present

## 2021-12-25 DIAGNOSIS — M6281 Muscle weakness (generalized): Secondary | ICD-10-CM | POA: Diagnosis not present

## 2021-12-25 DIAGNOSIS — R262 Difficulty in walking, not elsewhere classified: Secondary | ICD-10-CM | POA: Diagnosis not present

## 2021-12-28 DIAGNOSIS — F411 Generalized anxiety disorder: Secondary | ICD-10-CM | POA: Diagnosis not present

## 2021-12-28 DIAGNOSIS — G8929 Other chronic pain: Secondary | ICD-10-CM | POA: Diagnosis not present

## 2021-12-28 DIAGNOSIS — I739 Peripheral vascular disease, unspecified: Secondary | ICD-10-CM | POA: Diagnosis not present

## 2021-12-28 DIAGNOSIS — R030 Elevated blood-pressure reading, without diagnosis of hypertension: Secondary | ICD-10-CM | POA: Diagnosis not present

## 2021-12-28 DIAGNOSIS — H3530 Unspecified macular degeneration: Secondary | ICD-10-CM | POA: Diagnosis not present

## 2021-12-28 DIAGNOSIS — L309 Dermatitis, unspecified: Secondary | ICD-10-CM | POA: Diagnosis not present

## 2021-12-28 DIAGNOSIS — R32 Unspecified urinary incontinence: Secondary | ICD-10-CM | POA: Diagnosis not present

## 2021-12-28 DIAGNOSIS — E669 Obesity, unspecified: Secondary | ICD-10-CM | POA: Diagnosis not present

## 2021-12-28 DIAGNOSIS — Z6833 Body mass index (BMI) 33.0-33.9, adult: Secondary | ICD-10-CM | POA: Diagnosis not present

## 2022-01-01 DIAGNOSIS — M5459 Other low back pain: Secondary | ICD-10-CM | POA: Diagnosis not present

## 2022-01-01 DIAGNOSIS — M6281 Muscle weakness (generalized): Secondary | ICD-10-CM | POA: Diagnosis not present

## 2022-01-01 DIAGNOSIS — R262 Difficulty in walking, not elsewhere classified: Secondary | ICD-10-CM | POA: Diagnosis not present

## 2022-01-06 ENCOUNTER — Other Ambulatory Visit: Payer: Self-pay | Admitting: Nurse Practitioner

## 2022-01-08 DIAGNOSIS — R262 Difficulty in walking, not elsewhere classified: Secondary | ICD-10-CM | POA: Diagnosis not present

## 2022-01-08 DIAGNOSIS — M5459 Other low back pain: Secondary | ICD-10-CM | POA: Diagnosis not present

## 2022-01-08 DIAGNOSIS — M6281 Muscle weakness (generalized): Secondary | ICD-10-CM | POA: Diagnosis not present

## 2022-01-08 NOTE — Telephone Encounter (Signed)
Last GYN exam 05/2021

## 2022-01-15 DIAGNOSIS — M6281 Muscle weakness (generalized): Secondary | ICD-10-CM | POA: Diagnosis not present

## 2022-01-15 DIAGNOSIS — R262 Difficulty in walking, not elsewhere classified: Secondary | ICD-10-CM | POA: Diagnosis not present

## 2022-01-15 DIAGNOSIS — M5459 Other low back pain: Secondary | ICD-10-CM | POA: Diagnosis not present

## 2022-01-18 DIAGNOSIS — M6281 Muscle weakness (generalized): Secondary | ICD-10-CM | POA: Diagnosis not present

## 2022-01-18 DIAGNOSIS — R262 Difficulty in walking, not elsewhere classified: Secondary | ICD-10-CM | POA: Diagnosis not present

## 2022-01-18 DIAGNOSIS — M5459 Other low back pain: Secondary | ICD-10-CM | POA: Diagnosis not present

## 2022-01-22 DIAGNOSIS — R262 Difficulty in walking, not elsewhere classified: Secondary | ICD-10-CM | POA: Diagnosis not present

## 2022-01-22 DIAGNOSIS — M5459 Other low back pain: Secondary | ICD-10-CM | POA: Diagnosis not present

## 2022-01-22 DIAGNOSIS — M6281 Muscle weakness (generalized): Secondary | ICD-10-CM | POA: Diagnosis not present

## 2022-01-25 DIAGNOSIS — R262 Difficulty in walking, not elsewhere classified: Secondary | ICD-10-CM | POA: Diagnosis not present

## 2022-01-25 DIAGNOSIS — M6281 Muscle weakness (generalized): Secondary | ICD-10-CM | POA: Diagnosis not present

## 2022-01-25 DIAGNOSIS — M5459 Other low back pain: Secondary | ICD-10-CM | POA: Diagnosis not present

## 2022-02-05 DIAGNOSIS — L603 Nail dystrophy: Secondary | ICD-10-CM | POA: Diagnosis not present

## 2022-03-12 DIAGNOSIS — F411 Generalized anxiety disorder: Secondary | ICD-10-CM | POA: Diagnosis not present

## 2022-03-15 ENCOUNTER — Ambulatory Visit (INDEPENDENT_AMBULATORY_CARE_PROVIDER_SITE_OTHER): Payer: Medicare PPO | Admitting: Ophthalmology

## 2022-03-15 ENCOUNTER — Encounter (INDEPENDENT_AMBULATORY_CARE_PROVIDER_SITE_OTHER): Payer: Self-pay | Admitting: Ophthalmology

## 2022-03-15 DIAGNOSIS — H318 Other specified disorders of choroid: Secondary | ICD-10-CM | POA: Diagnosis not present

## 2022-03-15 DIAGNOSIS — H2512 Age-related nuclear cataract, left eye: Secondary | ICD-10-CM | POA: Diagnosis not present

## 2022-03-15 DIAGNOSIS — H353212 Exudative age-related macular degeneration, right eye, with inactive choroidal neovascularization: Secondary | ICD-10-CM

## 2022-03-15 DIAGNOSIS — H3561 Retinal hemorrhage, right eye: Secondary | ICD-10-CM

## 2022-03-15 DIAGNOSIS — H353211 Exudative age-related macular degeneration, right eye, with active choroidal neovascularization: Secondary | ICD-10-CM | POA: Diagnosis not present

## 2022-03-15 DIAGNOSIS — H353121 Nonexudative age-related macular degeneration, left eye, early dry stage: Secondary | ICD-10-CM | POA: Diagnosis not present

## 2022-03-15 NOTE — Progress Notes (Signed)
03/15/2022     CHIEF COMPLAINT Patient presents for  Chief Complaint  Patient presents with   Macular Degeneration      HISTORY OF PRESENT ILLNESS: [name] is a 81 y.o. female who presents to the clinic today for:   HPI   6 MOS FOR DILATE OU, COLOR FP, OCT. Pt stated, "My left eye seems a little foggy but I guess that comes with the cataract. Ive already had my cataract surgery in my right eye and ive been evaluated for my left eye but we haven't set the time and date yet." Pt reports vision has remained stable.  Last edited by Silvestre Moment on 03/15/2022  1:56 PM.      Referring physician: Lavone Orn, MD [place] [place],  [place] [id number]  HISTORICAL INFORMATION:   Selected notes from the [facility]: No current outpatient medications on file. (Ophthalmic Drugs)   No current facility-administered medications for this visit. (Ophthalmic Drugs)   Current Outpatient Medications (Other)  Medication Sig   amoxicillin (AMOXIL) 500 MG capsule amoxicillin 500 mg capsule  TAKE 4 CAPSULES 1 HOUR BEFORE [facility] DENTAL APPOINTMENT (Patient not taking: Reported on 08/02/2020)   Calcium Carbonate-Vitamin D3 600-400 MG-UNIT TABS 1 tablet   FLUoxetine (PROZAC) 20 MG tablet Take 60 mg by mouth daily.   valACYclovir (VALTREX) 500 MG tablet TAKE 1 TABLET BY MOUTH EVERY DAY   No current facility-administered medications for this visit. (Other)      REVIEW OF SYSTEMS: ROS   Negative for: Constitutional, Gastrointestinal, Neurological, Skin, Genitourinary, Musculoskeletal, HENT, Endocrine, Cardiovascular, Eyes, Respiratory, Psychiatric, Allergic/Imm, Heme/Lymph Last edited by Silvestre Moment on 03/15/2022  1:56 PM.       ALLERGIES No Known Allergies  PAST MEDICAL HISTORY Past Medical History:  Diagnosis Date   Achilles rupture    Anxiety    Arthritis    Atrophic vaginitis    Choroidal neovascularization of right eye  09/24/2019   Cystoid macular edema of right eye 09/24/2019   Detrusor instability    GERD (gastroesophageal reflux disease)    Osteoporosis    Psoriasis    Sciatic pain    Uterine prolapse    Past Surgical History:  Procedure Laterality Date   APPENDECTOMY     FOOT SURGERY     KNEE SURGERY     TOTAL KNEE ARTHROPLASTY Bilateral    X2-- 2009; 2010   TOTAL KNEE REVISION Left 01/23/2013   Procedure:  TOTAL KNEE ARTHROPLASTY REVISION ;  Surgeon: Gearlean Alf, MD;  Location: [facility];  Service: Orthopedics;  Laterality: Left;    FAMILY HISTORY Family History  Problem Relation Age of Onset   Cancer Mother        GALLBLADDER    Breast cancer Cousin        Mat. 1st cousin Age 82    SOCIAL HISTORY Social History   Tobacco Use   Smoking status: Former    Packs/day: 0.20    Types: Cigarettes    Quit date: 07/25/2015    Years since quitting: 6.6   Smokeless tobacco: Never  Vaping Use   Vaping Use: Never used  Substance Use Topics   Alcohol use: No    Alcohol/week: 0.0 standard drinks of alcohol   Drug use: No         OPHTHALMIC EXAM:  Base Eye Exam     Visual Acuity (ETDRS)  Right Left   Dist cc CF at 3' 20/150 +1   Dist ph cc  NI    Correction: Glasses         Tonometry (Tonopen, 2:05 PM)       Right Left   Pressure 14 10         Pupils       Pupils APD   Right PERRL None   Left PERRL None         Visual Fields       Left Right    Full    Restrictions  Partial inner superior temporal, inferior temporal, superior nasal, inferior nasal deficiencies         Extraocular Movement       Right Left    Full, Ortho Full, Ortho         Neuro/Psych     Oriented x3: Yes   Mood/Affect: Normal         Dilation     Both eyes: 1.0% Mydriacyl, 2.5% Phenylephrine @ 2:05 PM           Slit Lamp and Fundus Exam     External Exam       Right Left   External Normal Normal         Slit Lamp Exam       Right Left   Lids/Lashes  Normal Normal   Conjunctiva/Sclera White and quiet White and quiet   Cornea Clear Clear   Anterior Chamber Deep and quiet Deep and quiet   Iris Round and reactive Round and reactive   Lens Posterior chamber intraocular lens 3+ Nuclear sclerosis   Anterior Vitreous Normal Normal         Fundus Exam       Right Left   Posterior Vitreous Posterior vitreous detachment Posterior vitreous detachment   Disc Normal Normal   C/D Ratio 0.5 0.5   Macula Geographic atrophy in the FAZ, no membrane, Disciform scar, Advanced age related macular degeneration, no hemorrhage Early age related macular degeneration, Hard drusen, no hemorrhage   Vessels Normal Normal   Periphery Normal Normal            IMAGING AND PROCEDURES  Imaging and Procedures for 03/15/22  OCT, Retina - OU - Both Eyes       Right Eye Quality was good. Scan locations included subfoveal. Central Foveal Thickness: 127. Progression has improved. Findings include abnormal foveal contour.   Left Eye Quality was good. Scan locations included subfoveal. Central Foveal Thickness: 260. Progression has been stable. Findings include abnormal foveal contour, retinal drusen .   Notes Massive improvement in macular and pericapillary at anatomy since onset of wet ARMD November 2020 OD.  Diffuse foveal atrophy, no signs of recurrent or persistent CNVM or thickening nasally or in a peripapillary location OD   No sign of CNVM OU     Color Fundus Photography Optos - OU - Both Eyes       Right Eye Progression has been stable. Disc findings include normal observations. Macula : geographic atrophy. Vessels : normal observations. Periphery : normal observations.   Left Eye Progression has been stable. Macula : drusen. Vessels : normal observations. Periphery : normal observations.   Notes Incidental PVD OD and OS.  No active hemorrhage no active sign of CNVM OU             ASSESSMENT/PLAN:  Nuclear sclerotic cataract of  left eye Significant worsening of lens opacity left eye  do recommend follow-up with Dr. Marylynn Pearson for consideration of cataract surgery per her suggestion  Retinal hemorrhage of right eye Condition resolved  Exudative age-related macular degeneration of right eye with inactive choroidal neovascularization (HCC) No sign of active CNVM rate formation  Idiopathic polypoidal choroidal vasculopathy OD no sign of recurrence  Early stage nonexudative age-related macular degeneration of left eye Minor OS     ICD-10-CM   1. Exudative age-related macular degeneration of right eye with active choroidal neovascularization (HCC)  H35.3211 OCT, Retina - OU - Both Eyes    Color Fundus Photography Optos - OU - Both Eyes    2. Nuclear sclerotic cataract of left eye  H25.12     3. Retinal hemorrhage of right eye  H35.61     4. Exudative age-related macular degeneration of right eye with inactive choroidal neovascularization ([place])  H35.3212     5. Idiopathic polypoidal choroidal vasculopathy  H31.8     6. Early stage nonexudative age-related macular degeneration of left eye  H35.3121       1.  OD with a history of idiopathic polypoidal choroidal vasculopathy with CNVM formation massive hemorrhage.  Completely resolved.  No sign of recurrence.  2.  OS, early AMD no sign of progression  3.  Loss and change OS secondary to progression of NSC cataract changes left eye.  I do recommend consideration for cataract surgery with lens implantation.  Follow-up Dr. Marylynn Pearson for coordination of this event patient has asked me for how to have this improved  Ophthalmic Meds Ordered this visit:  No orders of the defined types were placed in this encounter.      Return in about 6 months (around 09/13/2022) for DILATE OU, COLOR FP, OCT.  There are no Patient Instructions on file for this visit.   Explained the diagnoses, plan, and follow up with the patient and they expressed understanding.  Patient  expressed understanding of the importance of proper follow up care.   Clent Demark Mckynzi Cammon M.D. Diseases & Surgery of the Retina and Vitreous [facility] [place] 03/15/22     Abbreviations: M myopia (nearsighted); A astigmatism; H hyperopia (farsighted); P presbyopia; Mrx spectacle prescription;  CTL contact lenses; OD right eye; OS left eye; OU both eyes  XT exotropia; ET esotropia; PEK punctate epithelial keratitis; PEE punctate epithelial erosions; DES dry eye syndrome; MGD meibomian gland dysfunction; ATs artificial tears; PFAT's preservative free artificial tears; [facility] nuclear sclerotic cataract; PSC posterior subcapsular cataract; ERM epi-retinal membrane; PVD posterior vitreous detachment; RD retinal detachment; DM diabetes mellitus; DR diabetic retinopathy; NPDR non-proliferative diabetic retinopathy; PDR proliferative diabetic retinopathy; CSME clinically significant macular edema; DME diabetic macular edema; dbh dot blot hemorrhages; CWS cotton wool spot; POAG primary open angle glaucoma; C/D cup-to-disc ratio; HVF humphrey visual field; GVF goldmann visual field; OCT optical coherence tomography; IOP intraocular pressure; BRVO Branch retinal vein occlusion; CRVO central retinal vein occlusion; CRAO central retinal artery occlusion; BRAO branch retinal artery occlusion; RT retinal tear; SB scleral buckle; PPV pars plana vitrectomy; VH Vitreous hemorrhage; PRP panretinal laser photocoagulation; IVK intravitreal kenalog; VMT vitreomacular traction; MH Macular hole;  NVD neovascularization of the disc; NVE neovascularization elsewhere; AREDS age related eye disease study; ARMD age related macular degeneration; POAG primary open angle glaucoma; EBMD epithelial/anterior basement membrane dystrophy; ACIOL anterior chamber intraocular lens; IOL intraocular lens; PCIOL posterior chamber intraocular lens; Phaco/IOL phacoemulsification with intraocular lens placement; PRK photorefractive keratectomy;  LASIK laser assisted in situ keratomileusis; HTN hypertension; DM diabetes  mellitus; COPD chronic obstructive pulmonary disease

## 2022-03-15 NOTE — Assessment & Plan Note (Signed)
OD no sign of recurrence

## 2022-03-15 NOTE — Assessment & Plan Note (Signed)
Significant worsening of lens opacity left eye do recommend follow-up with Dr. Marylynn Pearson for consideration of cataract surgery per her suggestion

## 2022-03-15 NOTE — Assessment & Plan Note (Signed)
No sign of active CNVM rate formation

## 2022-03-15 NOTE — Assessment & Plan Note (Signed)
Condition resolved 

## 2022-03-15 NOTE — Assessment & Plan Note (Signed)
Minor OS 

## 2022-03-26 DIAGNOSIS — Z1331 Encounter for screening for depression: Secondary | ICD-10-CM | POA: Diagnosis not present

## 2022-03-26 DIAGNOSIS — R5383 Other fatigue: Secondary | ICD-10-CM | POA: Diagnosis not present

## 2022-03-26 DIAGNOSIS — R351 Nocturia: Secondary | ICD-10-CM | POA: Diagnosis not present

## 2022-03-26 DIAGNOSIS — M169 Osteoarthritis of hip, unspecified: Secondary | ICD-10-CM | POA: Diagnosis not present

## 2022-03-26 DIAGNOSIS — Z136 Encounter for screening for cardiovascular disorders: Secondary | ICD-10-CM | POA: Diagnosis not present

## 2022-03-26 DIAGNOSIS — Z23 Encounter for immunization: Secondary | ICD-10-CM | POA: Diagnosis not present

## 2022-03-26 DIAGNOSIS — Z0000 Encounter for general adult medical examination without abnormal findings: Secondary | ICD-10-CM | POA: Diagnosis not present

## 2022-03-26 DIAGNOSIS — M8589 Other specified disorders of bone density and structure, multiple sites: Secondary | ICD-10-CM | POA: Diagnosis not present

## 2022-03-26 DIAGNOSIS — F419 Anxiety disorder, unspecified: Secondary | ICD-10-CM | POA: Diagnosis not present

## 2022-03-26 DIAGNOSIS — Z79899 Other long term (current) drug therapy: Secondary | ICD-10-CM | POA: Diagnosis not present

## 2022-03-26 DIAGNOSIS — R269 Unspecified abnormalities of gait and mobility: Secondary | ICD-10-CM | POA: Diagnosis not present

## 2022-04-26 DIAGNOSIS — M542 Cervicalgia: Secondary | ICD-10-CM | POA: Diagnosis not present

## 2022-05-03 ENCOUNTER — Ambulatory Visit: Payer: Medicare PPO | Admitting: Nurse Practitioner

## 2022-05-03 DIAGNOSIS — Z0289 Encounter for other administrative examinations: Secondary | ICD-10-CM

## 2022-05-08 DIAGNOSIS — M542 Cervicalgia: Secondary | ICD-10-CM | POA: Diagnosis not present

## 2022-05-08 DIAGNOSIS — M5451 Vertebrogenic low back pain: Secondary | ICD-10-CM | POA: Diagnosis not present

## 2022-05-22 ENCOUNTER — Emergency Department (HOSPITAL_COMMUNITY): Payer: Medicare PPO

## 2022-05-22 ENCOUNTER — Encounter (HOSPITAL_COMMUNITY): Payer: Self-pay

## 2022-05-22 ENCOUNTER — Other Ambulatory Visit: Payer: Self-pay

## 2022-05-22 ENCOUNTER — Inpatient Hospital Stay (HOSPITAL_COMMUNITY)
Admission: EM | Admit: 2022-05-22 | Discharge: 2022-05-28 | DRG: 543 | Disposition: A | Discharge status: Skilled Nursing Facility | Payer: Medicare PPO | Attending: Internal Medicine | Admitting: Internal Medicine

## 2022-05-22 DIAGNOSIS — M4854XA Collapsed vertebra, not elsewhere classified, thoracic region, initial encounter for fracture: Principal | ICD-10-CM | POA: Diagnosis present

## 2022-05-22 DIAGNOSIS — R531 Weakness: Secondary | ICD-10-CM | POA: Diagnosis not present

## 2022-05-22 DIAGNOSIS — E669 Obesity, unspecified: Secondary | ICD-10-CM | POA: Diagnosis present

## 2022-05-22 DIAGNOSIS — G8929 Other chronic pain: Secondary | ICD-10-CM | POA: Diagnosis present

## 2022-05-22 DIAGNOSIS — H353212 Exudative age-related macular degeneration, right eye, with inactive choroidal neovascularization: Secondary | ICD-10-CM | POA: Diagnosis present

## 2022-05-22 DIAGNOSIS — J432 Centrilobular emphysema: Secondary | ICD-10-CM | POA: Diagnosis not present

## 2022-05-22 DIAGNOSIS — E538 Deficiency of other specified B group vitamins: Secondary | ICD-10-CM | POA: Diagnosis present

## 2022-05-22 DIAGNOSIS — Z87891 Personal history of nicotine dependence: Secondary | ICD-10-CM | POA: Diagnosis not present

## 2022-05-22 DIAGNOSIS — S2242XA Multiple fractures of ribs, left side, initial encounter for closed fracture: Secondary | ICD-10-CM | POA: Diagnosis present

## 2022-05-22 DIAGNOSIS — Z96653 Presence of artificial knee joint, bilateral: Secondary | ICD-10-CM | POA: Diagnosis present

## 2022-05-22 DIAGNOSIS — F32A Depression, unspecified: Secondary | ICD-10-CM | POA: Diagnosis present

## 2022-05-22 DIAGNOSIS — R0781 Pleurodynia: Secondary | ICD-10-CM | POA: Diagnosis present

## 2022-05-22 DIAGNOSIS — I6523 Occlusion and stenosis of bilateral carotid arteries: Secondary | ICD-10-CM | POA: Diagnosis not present

## 2022-05-22 DIAGNOSIS — S199XXA Unspecified injury of neck, initial encounter: Secondary | ICD-10-CM | POA: Diagnosis not present

## 2022-05-22 DIAGNOSIS — Z9181 History of falling: Secondary | ICD-10-CM | POA: Diagnosis not present

## 2022-05-22 DIAGNOSIS — K7689 Other specified diseases of liver: Secondary | ICD-10-CM | POA: Diagnosis not present

## 2022-05-22 DIAGNOSIS — I10 Essential (primary) hypertension: Secondary | ICD-10-CM | POA: Diagnosis not present

## 2022-05-22 DIAGNOSIS — M810 Age-related osteoporosis without current pathological fracture: Secondary | ICD-10-CM | POA: Diagnosis present

## 2022-05-22 DIAGNOSIS — Z6830 Body mass index (BMI) 30.0-30.9, adult: Secondary | ICD-10-CM | POA: Diagnosis not present

## 2022-05-22 DIAGNOSIS — M549 Dorsalgia, unspecified: Secondary | ICD-10-CM | POA: Diagnosis present

## 2022-05-22 DIAGNOSIS — K219 Gastro-esophageal reflux disease without esophagitis: Secondary | ICD-10-CM | POA: Diagnosis present

## 2022-05-22 DIAGNOSIS — R296 Repeated falls: Secondary | ICD-10-CM

## 2022-05-22 DIAGNOSIS — L409 Psoriasis, unspecified: Secondary | ICD-10-CM | POA: Diagnosis present

## 2022-05-22 DIAGNOSIS — Z803 Family history of malignant neoplasm of breast: Secondary | ICD-10-CM

## 2022-05-22 DIAGNOSIS — M50222 Other cervical disc displacement at C5-C6 level: Secondary | ICD-10-CM | POA: Diagnosis not present

## 2022-05-22 DIAGNOSIS — W19XXXA Unspecified fall, initial encounter: Secondary | ICD-10-CM | POA: Diagnosis present

## 2022-05-22 DIAGNOSIS — F419 Anxiety disorder, unspecified: Secondary | ICD-10-CM | POA: Diagnosis present

## 2022-05-22 DIAGNOSIS — M542 Cervicalgia: Secondary | ICD-10-CM | POA: Diagnosis not present

## 2022-05-22 DIAGNOSIS — R9431 Abnormal electrocardiogram [ECG] [EKG]: Secondary | ICD-10-CM | POA: Diagnosis not present

## 2022-05-22 DIAGNOSIS — Z043 Encounter for examination and observation following other accident: Secondary | ICD-10-CM | POA: Diagnosis not present

## 2022-05-22 DIAGNOSIS — M6281 Muscle weakness (generalized): Secondary | ICD-10-CM | POA: Diagnosis not present

## 2022-05-22 DIAGNOSIS — M4802 Spinal stenosis, cervical region: Secondary | ICD-10-CM | POA: Diagnosis present

## 2022-05-22 DIAGNOSIS — S0990XA Unspecified injury of head, initial encounter: Secondary | ICD-10-CM | POA: Diagnosis not present

## 2022-05-22 DIAGNOSIS — M4316 Spondylolisthesis, lumbar region: Secondary | ICD-10-CM | POA: Diagnosis not present

## 2022-05-22 DIAGNOSIS — M40204 Unspecified kyphosis, thoracic region: Secondary | ICD-10-CM | POA: Diagnosis not present

## 2022-05-22 DIAGNOSIS — Z79899 Other long term (current) drug therapy: Secondary | ICD-10-CM

## 2022-05-22 DIAGNOSIS — R293 Abnormal posture: Secondary | ICD-10-CM | POA: Diagnosis not present

## 2022-05-22 DIAGNOSIS — G319 Degenerative disease of nervous system, unspecified: Secondary | ICD-10-CM | POA: Diagnosis not present

## 2022-05-22 DIAGNOSIS — S3991XA Unspecified injury of abdomen, initial encounter: Secondary | ICD-10-CM | POA: Diagnosis not present

## 2022-05-22 DIAGNOSIS — J439 Emphysema, unspecified: Secondary | ICD-10-CM | POA: Diagnosis not present

## 2022-05-22 DIAGNOSIS — I700 Atherosclerosis of aorta: Secondary | ICD-10-CM | POA: Diagnosis not present

## 2022-05-22 DIAGNOSIS — M5136 Other intervertebral disc degeneration, lumbar region: Secondary | ICD-10-CM | POA: Diagnosis not present

## 2022-05-22 DIAGNOSIS — M545 Low back pain, unspecified: Secondary | ICD-10-CM | POA: Diagnosis not present

## 2022-05-22 DIAGNOSIS — Z8619 Personal history of other infectious and parasitic diseases: Secondary | ICD-10-CM | POA: Diagnosis present

## 2022-05-22 DIAGNOSIS — M50221 Other cervical disc displacement at C4-C5 level: Secondary | ICD-10-CM | POA: Diagnosis not present

## 2022-05-22 DIAGNOSIS — R0902 Hypoxemia: Secondary | ICD-10-CM | POA: Diagnosis not present

## 2022-05-22 DIAGNOSIS — S2243XA Multiple fractures of ribs, bilateral, initial encounter for closed fracture: Secondary | ICD-10-CM | POA: Diagnosis not present

## 2022-05-22 DIAGNOSIS — E6609 Other obesity due to excess calories: Secondary | ICD-10-CM

## 2022-05-22 DIAGNOSIS — S32010A Wedge compression fracture of first lumbar vertebra, initial encounter for closed fracture: Secondary | ICD-10-CM | POA: Diagnosis not present

## 2022-05-22 DIAGNOSIS — M2669 Other specified disorders of temporomandibular joint: Secondary | ICD-10-CM | POA: Diagnosis not present

## 2022-05-22 DIAGNOSIS — Z7401 Bed confinement status: Secondary | ICD-10-CM | POA: Diagnosis not present

## 2022-05-22 DIAGNOSIS — J9811 Atelectasis: Secondary | ICD-10-CM | POA: Diagnosis not present

## 2022-05-22 NOTE — ED Triage Notes (Addendum)
PER EMS: pt is from home after falling tonight when walking. She states her legs are progressively getting weaker. Denies LOC, no blood thinners, no head injury. She reports pain to left rib cage area. She states she thinks her side hit a table.  A&Ox4  BP- 160/80, HR-76, 96% RA, CBG-120

## 2022-05-22 NOTE — ED Provider Triage Note (Signed)
Emergency Medicine Provider Triage Evaluation Note  [name] , a 81 y.o. female  was evaluated in triage.  Pt complains of left rib pain and neck pain after falling.  States she lost her balance and has chronic balance issues due to degenerative disease in her neck.  Denies any preceding dizziness or lightheadedness.  No chest pain or shortness of breath.  Complains of pain to her left rib and left neck.  No blood thinner use.  No chest pain or shortness of breath.  No loss of consciousness..  Review of Systems  Positive: Left rib pain, left neck pain Negative: Chest pain, headache, back pain  Physical Exam  BP (!) 147/76 (BP Location: Right Arm)   Pulse 72   Temp 97.8 F (36.6 C)   Resp 14   Ht 5\' 8"  (1.727 m)   Wt 90.7 kg   SpO2 97%   BMI 30.41 kg/m  Gen:   Awake, no distress   Resp:  Normal effort  MSK:   Moves extremities without difficulty  Other:  Left lateral rib tenderness, no crepitus or ecchymosis. Left paraspinal C-spine tenderness, no midline tenderness  Medical Decision Making  Medically screening exam initiated at 10:06 PM.  Appropriate orders placed.  [name] was informed that the remainder of the evaluation will be completed by another provider, this initial triage assessment does not replace that evaluation, and the importance of remaining in the ED until their evaluation is complete.  Left rib pain after falling.   , MD 05/22/22 [id number]

## 2022-05-23 ENCOUNTER — Emergency Department (HOSPITAL_COMMUNITY): Payer: Medicare PPO

## 2022-05-23 DIAGNOSIS — K7689 Other specified diseases of liver: Secondary | ICD-10-CM | POA: Diagnosis not present

## 2022-05-23 DIAGNOSIS — S3991XA Unspecified injury of abdomen, initial encounter: Secondary | ICD-10-CM | POA: Diagnosis not present

## 2022-05-23 DIAGNOSIS — J9811 Atelectasis: Secondary | ICD-10-CM | POA: Diagnosis not present

## 2022-05-23 DIAGNOSIS — R296 Repeated falls: Secondary | ICD-10-CM

## 2022-05-23 DIAGNOSIS — M5136 Other intervertebral disc degeneration, lumbar region: Secondary | ICD-10-CM | POA: Diagnosis not present

## 2022-05-23 DIAGNOSIS — S2242XA Multiple fractures of ribs, left side, initial encounter for closed fracture: Secondary | ICD-10-CM | POA: Diagnosis not present

## 2022-05-23 DIAGNOSIS — E6609 Other obesity due to excess calories: Secondary | ICD-10-CM

## 2022-05-23 DIAGNOSIS — J432 Centrilobular emphysema: Secondary | ICD-10-CM | POA: Diagnosis not present

## 2022-05-23 DIAGNOSIS — I700 Atherosclerosis of aorta: Secondary | ICD-10-CM | POA: Diagnosis not present

## 2022-05-23 DIAGNOSIS — M4316 Spondylolisthesis, lumbar region: Secondary | ICD-10-CM | POA: Diagnosis not present

## 2022-05-23 LAB — I-STAT CHEM 8, ED
BUN: 14 mg/dL (ref 8–23)
Calcium, Ion: 1.13 mmol/L — ABNORMAL LOW (ref 1.15–1.40)
Chloride: 103 mmol/L (ref 98–111)
Creatinine, Ser: 0.5 mg/dL (ref 0.44–1.00)
Glucose, Bld: 89 mg/dL (ref 70–99)
HCT: 45 % (ref 36.0–46.0)
Hemoglobin: 15.3 g/dL — ABNORMAL HIGH (ref 12.0–15.0)
Potassium: 3.7 mmol/L (ref 3.5–5.1)
Sodium: 141 mmol/L (ref 135–145)
TCO2: 29 mmol/L (ref 22–32)

## 2022-05-23 LAB — CBC WITH DIFFERENTIAL/PLATELET
Abs Immature Granulocytes: 0.02 10*3/uL (ref 0.00–0.07)
Basophils Absolute: 0.1 10*3/uL (ref 0.0–0.1)
Basophils Relative: 1 %
Eosinophils Absolute: 0.1 10*3/uL (ref 0.0–0.5)
Eosinophils Relative: 1 %
HCT: 45.2 % (ref 36.0–46.0)
Hemoglobin: 14.3 g/dL (ref 12.0–15.0)
Immature Granulocytes: 0 %
Lymphocytes Relative: 21 %
Lymphs Abs: 1.7 10*3/uL (ref 0.7–4.0)
MCH: 28.3 pg (ref 26.0–34.0)
MCHC: 31.6 g/dL (ref 30.0–36.0)
MCV: 89.3 fL (ref 80.0–100.0)
Monocytes Absolute: 0.6 10*3/uL (ref 0.1–1.0)
Monocytes Relative: 8 %
Neutro Abs: 5.4 10*3/uL (ref 1.7–7.7)
Neutrophils Relative %: 69 %
Platelets: 187 10*3/uL (ref 150–400)
RBC: 5.06 MIL/uL (ref 3.87–5.11)
RDW: 13.1 % (ref 11.5–15.5)
WBC: 7.9 10*3/uL (ref 4.0–10.5)
nRBC: 0 % (ref 0.0–0.2)

## 2022-05-23 LAB — BASIC METABOLIC PANEL
Anion gap: 9 (ref 5–15)
BUN: 12 mg/dL (ref 8–23)
CO2: 29 mmol/L (ref 22–32)
Calcium: 9.6 mg/dL (ref 8.9–10.3)
Chloride: 103 mmol/L (ref 98–111)
Creatinine, Ser: 0.61 mg/dL (ref 0.44–1.00)
GFR, Estimated: >60 mL/min (ref >60)
Glucose, Bld: 89 mg/dL (ref 70–99)
Potassium: 3.6 mmol/L (ref 3.5–5.1)
Sodium: 141 mmol/L (ref 135–145)

## 2022-05-23 MED ORDER — MORPHINE SULFATE (PF) 2 MG/ML IV SOLN
2.0000 mg | INTRAVENOUS | Status: DC | PRN
  Administered 2022-05-23: 2 mg via INTRAVENOUS
  Filled 2022-05-23: qty 1

## 2022-05-23 MED ORDER — FLUOXETINE HCL 20 MG PO TABS: 60.0000 mg | ORAL_TABLET | Freq: Every day | ORAL | Status: DC

## 2022-05-23 MED ORDER — FLUOXETINE HCL 20 MG PO CAPS
60.0000 mg | ORAL_CAPSULE | Freq: Every day | ORAL | Status: DC
  Administered 2022-05-24 – 2022-05-28 (×5): 60 mg via ORAL
  Filled 2022-05-23 (×5): qty 3

## 2022-05-23 MED ORDER — ACETAMINOPHEN 325 MG PO TABS: 650.0000 mg | ORAL_TABLET | Freq: Four times a day (QID) | ORAL | Status: DC | PRN

## 2022-05-23 MED ORDER — ACETAMINOPHEN 650 MG RE SUPP: 650.0000 mg | Freq: Four times a day (QID) | RECTAL | Status: DC | PRN

## 2022-05-23 MED ORDER — LACTATED RINGERS IV SOLN: INTRAVENOUS | Status: AC

## 2022-05-23 MED ORDER — POLYETHYLENE GLYCOL 3350 17 G PO PACK: 17.0000 g | PACK | Freq: Every day | ORAL | Status: DC | PRN

## 2022-05-23 MED ORDER — IOHEXOL 350 MG/ML SOLN
80.0000 mL | Freq: Once | INTRAVENOUS | Status: AC | PRN
  Administered 2022-05-23: 80 mL via INTRAVENOUS

## 2022-05-23 MED ORDER — ONDANSETRON HCL 4 MG/2ML IJ SOLN: 4.0000 mg | Freq: Four times a day (QID) | INTRAMUSCULAR | Status: DC | PRN

## 2022-05-23 MED ORDER — ONDANSETRON HCL 4 MG PO TABS: 4.0000 mg | ORAL_TABLET | Freq: Four times a day (QID) | ORAL | Status: DC | PRN

## 2022-05-23 MED ORDER — MORPHINE SULFATE (PF) 2 MG/ML IV SOLN
2.0000 mg | Freq: Once | INTRAVENOUS | Status: AC
  Administered 2022-05-23: 2 mg via INTRAVENOUS
  Filled 2022-05-23: qty 1

## 2022-05-23 MED ORDER — LIDOCAINE 5 % EX PTCH
1.0000 | MEDICATED_PATCH | CUTANEOUS | Status: DC
  Administered 2022-05-23 – 2022-05-27 (×5): 1 via TRANSDERMAL
  Filled 2022-05-23 (×5): qty 1

## 2022-05-23 MED ORDER — DOCUSATE SODIUM 100 MG PO CAPS
100.0000 mg | ORAL_CAPSULE | Freq: Two times a day (BID) | ORAL | Status: DC
  Administered 2022-05-23 – 2022-05-25 (×5): 100 mg via ORAL
  Filled 2022-05-23 (×5): qty 1

## 2022-05-23 MED ORDER — HYDRALAZINE HCL 20 MG/ML IJ SOLN: 5.0000 mg | INTRAMUSCULAR | Status: DC | PRN

## 2022-05-23 MED ORDER — LORAZEPAM 0.5 MG PO TABS
0.5000 mg | ORAL_TABLET | Freq: Every day | ORAL | Status: DC | PRN
  Administered 2022-05-26 – 2022-05-27 (×2): 0.5 mg via ORAL
  Filled 2022-05-23 (×2): qty 1

## 2022-05-23 MED ORDER — OXYCODONE HCL 5 MG PO TABS
5.0000 mg | ORAL_TABLET | ORAL | Status: DC | PRN
  Administered 2022-05-23 – 2022-05-28 (×11): 5 mg via ORAL
  Filled 2022-05-23 (×11): qty 1

## 2022-05-23 MED ORDER — ENOXAPARIN SODIUM 40 MG/0.4ML IJ SOSY
40.0000 mg | PREFILLED_SYRINGE | Freq: Every day | INTRAMUSCULAR | Status: DC
  Administered 2022-05-23 – 2022-05-28 (×6): 40 mg via SUBCUTANEOUS
  Filled 2022-05-23 (×6): qty 0.4

## 2022-05-23 MED ORDER — VALACYCLOVIR HCL 500 MG PO TABS
500.0000 mg | ORAL_TABLET | Freq: Every day | ORAL | Status: DC
  Administered 2022-05-24 – 2022-05-28 (×5): 500 mg via ORAL
  Filled 2022-05-23 (×5): qty 1

## 2022-05-23 MED ORDER — BISACODYL 5 MG PO TBEC
5.0000 mg | DELAYED_RELEASE_TABLET | Freq: Every day | ORAL | Status: DC | PRN
  Administered 2022-05-27 – 2022-05-28 (×2): 5 mg via ORAL
  Filled 2022-05-23 (×2): qty 1

## 2022-05-23 NOTE — ED Notes (Signed)
Provider notified that the patient is requesting pain meds when possible

## 2022-05-23 NOTE — ED Notes (Signed)
Per RN Allena Katz, bed is ready and he is ready for the patient

## 2022-05-23 NOTE — ED Provider Notes (Signed)
[facility] EMERGENCY DEPARTMENT Provider Note   CSN: [id number] Arrival date & time: 05/22/22  2157     History  Chief Complaint  Patient presents with   [name] is a 81 y.o. female presented with her husband today for evaluation of left rib pain after fall.  She reports that she has had frequent falls over the past few months she believes this is due to her chronic neck and back issues.  She reports most recent fall occurred last night she fell striking her left ribs she reports left rib pain and left neck pain since that time pain is sharp aching moderate intensity worsens with palpation and improves somewhat with rest.  She denies any head injury or loss of consciousness.  She denies any difficulty breathing.  She denies abdominal pain, nausea, vomiting, saddle paresthesias, incontinence or any additional concerns.  HPI     Home Medications Prior to Admission medications   Medication Sig Start Date End Date Taking? Authorizing Provider  amoxicillin (AMOXIL) 500 MG capsule Take 500 mg by mouth See admin instructions. Take one pill before dentist appointment. 06/30/12  Yes [provider]  Calcium Carbonate-Vitamin D3 600-400 MG-UNIT TABS Take 1 tablet by mouth daily.   Yes [provider]  Cetirizine HCl (ZYRTEC PO) Take 1 tablet by mouth daily as needed (Allergies).   Yes [provider]  FLUoxetine (PROZAC) 20 MG tablet Take 60 mg by mouth daily.   Yes [provider]  LORazepam (ATIVAN) 0.5 MG tablet Take 0.5 mg by mouth daily as needed for anxiety or sleep.   Yes [provider]  Multiple Vitamins-Minerals (WOMENS MULTI PO) Take 1 tablet by mouth daily.   Yes [provider]  valACYclovir (VALTREX) 500 MG tablet TAKE 1 TABLET BY MOUTH EVERY DAY Patient taking differently: Take 500 mg by mouth daily. 01/08/22  Yes [name], NP      Allergies    Patient has no known allergies.     Review of Systems   Review of Systems Ten systems are reviewed and are negative for acute change except as noted in the HPI  Physical Exam Updated Vital Signs BP (!) 165/94 (BP Location: Left Arm)   Pulse 74   Temp 98.3 F (36.8 C)   Resp 16   Ht 5\' 8"  (1.727 m)   Wt 90.7 kg   SpO2 98%   BMI 30.41 kg/m  Physical Exam Constitutional:      General: She is not in acute distress.    Appearance: Normal appearance. She is well-developed. She is not ill-appearing or diaphoretic.  HENT:     Head: Normocephalic and atraumatic.  Eyes:     General: Vision grossly intact. Gaze aligned appropriately.     Pupils: Pupils are equal, round, and reactive to light.  Neck:     Trachea: Trachea and phonation normal.  Cardiovascular:     Rate and Rhythm: Normal rate and regular rhythm.  Pulmonary:     Effort: Pulmonary effort is normal. No respiratory distress.  Chest:     Chest wall: Tenderness present. No deformity or crepitus.    Abdominal:     General: There is no distension.     Palpations: Abdomen is soft.     Tenderness: There is abdominal tenderness in the left upper quadrant. There is no guarding or rebound.     Comments: Mild left upper quadrant tenderness to palpation  Musculoskeletal:  General: Normal range of motion.     Cervical back: Normal range of motion.     Comments: No midline potential patient.  No crepitus step-off or in the spine.  No paraspinal muscular tenderness palpation.  Skin:    General: Skin is warm and dry.  Neurological:     Mental Status: She is alert.     GCS: GCS eye subscore is 4. GCS verbal subscore is 5. GCS motor subscore is 6.     Comments: Speech is clear and goal oriented, follows commands Major Cranial nerves without deficit, no facial droop Moves extremities without ataxia, coordination intact  Psychiatric:        Behavior: Behavior normal.    ED Results / Procedures / Treatments   Labs (all labs ordered are listed, but only  abnormal results are displayed) Labs Reviewed  I-STAT CHEM 8, ED - Abnormal; Notable for the following components:      Result Value   Calcium, Ion 1.13 (*)    Hemoglobin 15.3 (*)    All other components within normal limits  CBC WITH DIFFERENTIAL/PLATELET  BASIC METABOLIC PANEL    EKG EKG Interpretation  Date/Time:  Tuesday May 22 2022 22:39:34 EST Ventricular Rate:  70 PR Interval:  180 QRS Duration: 90 QT Interval:  442 QTC Calculation: 477 R Axis:   72 Text Interpretation: Normal sinus rhythm Cannot rule out Anterior infarct , age undetermined Abnormal ECG When compared with ECG of 19-Jan-2013 11:20, No significant change was found Confirmed by Dione BoozeGlick, David ([id number]) on 05/23/2022 12:01:26 AM  Radiology DG Pelvis 1-2 Views  Result Date: 05/23/2022 CLINICAL DATA:  Status post fall. EXAM: PELVIS - 1-2 VIEW COMPARISON:  None Available. FINDINGS: The bones appear mildly demineralized. There is no evidence of acute fracture or dislocation. Mild symmetric degenerative changes at both hips and sacroiliac joints. The soft tissues appear unremarkable. IMPRESSION: No evidence of acute fracture or dislocation. Mild symmetric degenerative changes. Electronically Signed   By: Carey BullocksWilliam  Veazey M.D.   On: 05/23/2022 09:41   DG Ribs Unilateral W/Chest Left  Result Date: 05/23/2022 CLINICAL DATA:  Weakness.  Fall.  Left chest pain. EXAM: LEFT RIBS AND CHEST - 3+ VIEW COMPARISON:  Chest radiographs 01/19/2013 and 05/19/2009. FINDINGS: The heart size is at the upper limits of normal. There is aortic atherosclerosis and mild vascular congestion. Small left pleural effusion and mild bibasilar atelectasis. No evidence of pneumothorax. There are at least 3 acute rib fractures on the left involving the 6th, 7th and 8th ribs anterolaterally. The 7th rib fracture is mildly displaced. Thoracolumbar spondylosis noted. Exclusion of mild thoracic compression deformities is difficult by this examination. If the  patient has focal back pain, dedicated thoracic radiographs should be considered. IMPRESSION: 1. At least 3 acute left-sided rib fractures as described with small left pleural effusion. No evidence of pneumothorax. 2. Borderline heart size with vascular congestion. 3. Potential thoracic compression deformities, suboptimally assessed on these views. Dedicated thoracic radiographs should be considered if clinically warranted. Electronically Signed   By: Carey BullocksWilliam  Veazey M.D.   On: 05/23/2022 09:40   CT Head Wo Contrast  Result Date: 05/22/2022 CLINICAL DATA:  Head trauma, moderate-severe; Neck trauma (Age >= 65y) EXAM: CT HEAD WITHOUT CONTRAST CT CERVICAL SPINE WITHOUT CONTRAST TECHNIQUE: Multidetector CT imaging of the head and cervical spine was performed following the standard protocol without intravenous contrast. Multiplanar CT image reconstructions of the cervical spine were also generated. RADIATION DOSE REDUCTION: This exam was performed according to the  departmental dose-optimization program which includes automated exposure control, adjustment of the mA and/or kV according to patient size and/or use of iterative reconstruction technique. COMPARISON:  CT head 05/24/2021 FINDINGS: CT HEAD FINDINGS BRAIN: BRAIN Cerebral ventricle sizes are concordant with the degree of cerebral volume loss. Patchy and confluent areas of decreased attenuation are noted throughout the deep and periventricular white matter of the cerebral hemispheres bilaterally, compatible with chronic microvascular ischemic disease. No evidence of large-territorial acute infarction. No parenchymal hemorrhage. No mass lesion. No extra-axial collection. No mass effect or midline shift. No hydrocephalus. Basilar cisterns are patent. Vascular: No hyperdense vessel. Atherosclerotic calcifications are present within the cavernous internal carotid arteries. Skull: No acute fracture or focal lesion. Mild bilateral temporomandibular joint degenerative  changes. Sinuses/Orbits: Paranasal sinuses and mastoid air cells are clear. Right lens replacement. Otherwise the orbits are unremarkable. Other: None. CT CERVICAL SPINE FINDINGS Alignment: Normal. Skull base and vertebrae: Multilevel moderate severe degenerative changes of the spine. No associated severe osseous neural foraminal or central canal stenosis. No acute fracture. No aggressive appearing focal osseous lesion or focal pathologic process. Soft tissues and spinal canal: No prevertebral fluid or swelling. No visible canal hematoma. Upper chest: Mild emphysematous changes. Other: None. IMPRESSION: 1. No acute intracranial abnormality. 2. No acute displaced fracture or traumatic listhesis of the cervical spine. 3.  Emphysema (ICD10-J43.9). Electronically Signed   By: Tish Frederickson M.D.   On: 05/22/2022 23:52   CT Cervical Spine Wo Contrast  Result Date: 05/22/2022 CLINICAL DATA:  Head trauma, moderate-severe; Neck trauma (Age >= 65y) EXAM: CT HEAD WITHOUT CONTRAST CT CERVICAL SPINE WITHOUT CONTRAST TECHNIQUE: Multidetector CT imaging of the head and cervical spine was performed following the standard protocol without intravenous contrast. Multiplanar CT image reconstructions of the cervical spine were also generated. RADIATION DOSE REDUCTION: This exam was performed according to the departmental dose-optimization program which includes automated exposure control, adjustment of the mA and/or kV according to patient size and/or use of iterative reconstruction technique. COMPARISON:  CT head 05/24/2021 FINDINGS: CT HEAD FINDINGS BRAIN: BRAIN Cerebral ventricle sizes are concordant with the degree of cerebral volume loss. Patchy and confluent areas of decreased attenuation are noted throughout the deep and periventricular white matter of the cerebral hemispheres bilaterally, compatible with chronic microvascular ischemic disease. No evidence of large-territorial acute infarction. No parenchymal hemorrhage. No  mass lesion. No extra-axial collection. No mass effect or midline shift. No hydrocephalus. Basilar cisterns are patent. Vascular: No hyperdense vessel. Atherosclerotic calcifications are present within the cavernous internal carotid arteries. Skull: No acute fracture or focal lesion. Mild bilateral temporomandibular joint degenerative changes. Sinuses/Orbits: Paranasal sinuses and mastoid air cells are clear. Right lens replacement. Otherwise the orbits are unremarkable. Other: None. CT CERVICAL SPINE FINDINGS Alignment: Normal. Skull base and vertebrae: Multilevel moderate severe degenerative changes of the spine. No associated severe osseous neural foraminal or central canal stenosis. No acute fracture. No aggressive appearing focal osseous lesion or focal pathologic process. Soft tissues and spinal canal: No prevertebral fluid or swelling. No visible canal hematoma. Upper chest: Mild emphysematous changes. Other: None. IMPRESSION: 1. No acute intracranial abnormality. 2. No acute displaced fracture or traumatic listhesis of the cervical spine. 3.  Emphysema (ICD10-J43.9). Electronically Signed   By: Tish Frederickson M.D.   On: 05/22/2022 23:52    Procedures Procedures    Medications Ordered in ED Medications  morphine (PF) 2 MG/ML injection 2 mg (2 mg Intravenous Given 05/23/22 1244)    ED Course/ Medical Decision Making/ A&P  Clinical Course as of 05/23/22 1542  Wed May 23, 2022  0900 DG Pelvis 1-2 Views [DR]    Clinical Course User Index [DR] Margarita Grizzle, MD                           Medical Decision Making 81 year old female presented for left rib and left neck pain after a fall last night.  She has had recurrent falls over the past 1 month.  She was found to have 3 left-sided rib fractures on radiographs today.  Currently awaiting CT imaging.  Her vital signs are stable however given patient's age, condition and recurrent falls along with multiple rib fractures feel patient would benefit  from admission to hospital for further management.  Patient and her husband are both agreeable for admission.  Amount and/or Complexity of Data Reviewed Labs: ordered.    Details: BMP shows no emergent electrolyte derangement, AKI or gap. CBC shows no anemia, thrombocytopenia or leukocytosis. Radiology: ordered.    Details: I personally reviewed and interpreted patient CT head.  I do not appreciate any obvious intracranial hemorrhage. I have personally reviewed and interpreted patient's CT cervical spine: Patient has degenerative changes.  I do not appreciate any obvious acute fracture or traumatic spinal thesis. I have personally reviewed and interpreted a 1 view radiograph of the patient's pelvis.  I do not appreciate any obvious acute fracture or location. I personally reviewed and interpreted patient's three-view radiograph of her left ribs and chest: I agree with radiologist that patient appears to have 3 rib fractures on the left side. I have personally reviewed and interpreted patient's CT chest abdomen pelvis.  It appears patient has not 3 left-sided rib fractures along with a small pleural effusion.  I do not appreciate any obvious PTX or PNA.  Do not appreciate any obvious intra-abdominal free air or hemorrhage.  Awaiting radiologist interpretation.  Risk Prescription drug management. Decision regarding hospitalization. Risk Details: Case discussed with hospitalist Dr. Ophelia Charter who accepted patient for admission.  Patient reevaluated multiple times she remains stable and in no acute distress.  Case was discussed with attending physician Dr. Lynelle Doctor who agrees with plan of care.  Note: Portions of this report may have been transcribed using voice recognition software. Every effort was made to ensure accuracy; however, inadvertent computerized transcription errors may still be present.         Final Clinical Impression(s) / ED Diagnoses Final diagnoses:  Recurrent falls  Closed  fracture of multiple ribs of left side, initial encounter    Rx / DC Orders ED Discharge Orders     None         Elizabeth Palau 05/24/22 Hart Robinsons, MD 05/25/22 1454

## 2022-05-23 NOTE — H&P (Signed)
History and Physical    Patient: [name] DOB: [birth year] DOA: 05/22/2022 DOS: the patient was seen and examined on 05/23/2022 PCP: Charlane Ferretti, MD  Patient coming from: Home - lives with husband; NOK: Tasnim, Phanor, [phone]   Chief Complaint: Recurrent falls  HPI: [name] is a 81 y.o. female with medical history significant of anxiety and neck pain presenting with a fall.   She reports that in 2021 she had an Achilles rupture; she went to podiatry Harlow Mares, DPM) and he reportedly told her that she was too chronically ill for surgical repair.  Instead, he sent her to PT and had her wear a boot and it eventually healed.  However, in the intervening 2 years she has become increasingly weak and needs assistive devices to get around even her house (mostly uses a cane for convenience).  Meanwhile, her neck is also weak and she has been to see Dr. Rolena Infante who reportedly told her she has degenerative disease of the cervical and lumbar spine and referred her to pain management for injection because he agreed that she is not a surgical candidate.  She has had a number of recent falls from her legs giving out.  This happened last night and she is having significant left-sided rib pain as a result of fractures.    ER Course:  Frequent falls, rib fractures today.  CT ordered as well.  Small effusion on the left.  Recommend pain management, obs.  Has leg weakness.     Review of Systems: As mentioned in the history of present illness. All other systems reviewed and are negative. 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/2019   Cystoid macular edema of right eye 09/24/2019   Detrusor instability    GERD (gastroesophageal reflux disease)    Osteoporosis    Psoriasis    Sciatic pain    Uterine prolapse    Past Surgical History:   Procedure Laterality Date   APPENDECTOMY     FOOT SURGERY     KNEE SURGERY     TOTAL KNEE ARTHROPLASTY Bilateral    X2-- 2009; 2010   TOTAL KNEE REVISION Left 01/23/2013   Procedure:  TOTAL KNEE ARTHROPLASTY REVISION ;  Surgeon: Gearlean Alf, MD;  Location: [facility];  Service: Orthopedics;  Laterality: Left;   Social History:  reports that she quit smoking about 6 years ago. Her smoking use included cigarettes. She smoked an average of .2 packs per day. She has never used smokeless tobacco. She reports that she does not drink alcohol and does not use drugs.  No Known Allergies  Family History  Problem Relation Age of Onset   Cancer Mother        GALLBLADDER    Breast cancer Cousin        Mat. 1st cousin Age 21    Prior to Admission medications   Medication Sig Start Date End Date Taking? Authorizing Provider  amoxicillin (AMOXIL) 500 MG capsule  06/30/12  Yes [provider]  Calcium Carbonate-Vitamin D3 600-400 MG-UNIT TABS Take 1 tablet by mouth daily.   Yes [provider]  FLUoxetine (PROZAC) 20 MG tablet Take 60 mg by mouth daily.   Yes [provider]  valACYclovir (VALTREX) 500 MG tablet TAKE 1 TABLET BY MOUTH EVERY DAY 01/08/22  Yes Marny Lowenstein  A, NP  LORazepam (ATIVAN) 0.5 MG tablet Take 0.5 mg by mouth daily as needed for anxiety or sleep.    [provider]    Physical Exam: Vitals:   05/23/22 1615 05/23/22 1630 05/23/22 1645 05/23/22 1715  BP: (!) 153/75 (!) 160/70 (!) 174/85 (!) 163/74  Pulse: 81 78 81 77  Resp:      Temp:      TempSrc:      SpO2: 99% 96% 90% 95%  Weight:      Height:       General:  Appears calm and comfortable and is in NAD Eyes:   EOMI, normal lids, iris ENT:  grossly normal hearing, lips & tongue, mmm Neck:  no LAD, masses or thyromegaly Cardiovascular:  RRR, no m/r/g. No LE edema.  Respiratory:   CTA bilaterally with no wheezes/rales/rhonchi.  Normal respiratory effort.  Left-sided rib  pain. Abdomen:  soft, NT, ND Skin:  no rash or induration seen on limited exam Musculoskeletal:  diminished strength in BUE < BLE, no bony abnormality Psychiatric:  grossly normal mood and affect, speech fluent and appropriate, AOx3 Neurologic:  CN 2-12 grossly intact, moves all extremities in coordinated fashion with weakness in hip flexion primarily   Radiological Exams on Admission: Independently reviewed - see discussion in A/P where applicable  CT CHEST ABDOMEN PELVIS W CONTRAST  Result Date: 05/23/2022 CLINICAL DATA:  Status post fall. EXAM: CT CHEST, ABDOMEN, AND PELVIS WITH CONTRAST TECHNIQUE: Multidetector CT imaging of the chest, abdomen and pelvis was performed following the standard protocol during bolus administration of intravenous contrast. RADIATION DOSE REDUCTION: This exam was performed according to the departmental dose-optimization program which includes automated exposure control, adjustment of the mA and/or kV according to patient size and/or use of iterative reconstruction technique. CONTRAST:  18mL OMNIPAQUE IOHEXOL 350 MG/ML SOLN COMPARISON:  None Available. FINDINGS: CT CHEST FINDINGS Cardiovascular: Heart size is mildly enlarged. No pericardial effusion. Aortic atherosclerosis and coronary artery calcifications. Mediastinum/Nodes: No enlarged mediastinal, hilar, or axillary lymph nodes. Thyroid gland, trachea, and esophagus demonstrate no significant findings. Lungs/Pleura: Mild centrilobular and paraseptal emphysema with diffuse bronchial wall thickening. No pleural fluid. Subpleural consolidation is noted overlying bilateral lung bases along with areas of subsegmental atelectasis and volume loss. No suspicious pulmonary nodule or mass. Musculoskeletal: Fractures are identified involving the lateral aspect of the left forth, sixth, seventh, and eighth ribs. There is also a fracture involving the posterior aspect of the left eleventh rib. There are age indeterminate  compression deformities involving the T8, T12, and L1 vertebra. Most advanced at the L1 level where there is greater than 50% loss of the vertebral body height. Schmorl's node deformity involves the superior endplate of 624THL. CT ABDOMEN PELVIS FINDINGS Hepatobiliary: No hepatic injury or perihepatic hematoma. Cyst within posterior right lobe of liver measures 1.2 cm. Left lobe of liver cyst measures 1.1 cm. Several, subcentimeter, too small to characterize low-density liver lesions are also noted. Pancreas: Unremarkable. No pancreatic ductal dilatation or surrounding inflammatory changes. Spleen: No splenic injury or perisplenic hematoma. Adrenals/Urinary Tract: Normal adrenal glands. No nephrolithiasis, hydronephrosis or suspicious mass. Urinary bladder is unremarkable. Stomach/Bowel: Stomach is within normal limits. No evidence of bowel wall thickening, distention, or inflammatory changes. Vascular/Lymphatic: Aortic atherosclerosis. No abdominopelvic adenopathy. Reproductive: Pessary device is identified within the vaginal apex. Uterus and adnexal structures are unremarkable. Other: No free fluid or fluid collections. Musculoskeletal: Age-indeterminate fracture deformity involving the L1 vertebra is noted with loss of approximately 60% of the vertebral  body height. Anterolisthesis of L4 on L5 measures 6 mm. Moderate degenerative disc disease with vacuum disc noted at L4-5. IMPRESSION: 1. Acute fractures involving the lateral aspect of the left forth, sixth, seventh, and eighth ribs. There is also a fracture involving the posterior aspect of the left eleventh rib. 2. Bilateral subpleural consolidation and atelectasis identified within both lung bases. No pleural fluid noted. 3. Age-indeterminate compression deformities involving the T8, T12, and L1 vertebra. Most severe at the L1 level where there is greater than 50% loss of the vertebral body height. 4. No signs of solid organ injury within the chest, abdomen or  pelvis. 5. Aortic Atherosclerosis (ICD10-I70.0) and Emphysema (ICD10-J43.9). Electronically Signed   By: Signa Kell M.D.   On: 05/23/2022 16:15   DG Pelvis 1-2 Views  Result Date: 05/23/2022 CLINICAL DATA:  Status post fall. EXAM: PELVIS - 1-2 VIEW COMPARISON:  None Available. FINDINGS: The bones appear mildly demineralized. There is no evidence of acute fracture or dislocation. Mild symmetric degenerative changes at both hips and sacroiliac joints. The soft tissues appear unremarkable. IMPRESSION: No evidence of acute fracture or dislocation. Mild symmetric degenerative changes. Electronically Signed   By: Carey Bullocks M.D.   On: 05/23/2022 09:41   DG Ribs Unilateral W/Chest Left  Result Date: 05/23/2022 CLINICAL DATA:  Weakness.  Fall.  Left chest pain. EXAM: LEFT RIBS AND CHEST - 3+ VIEW COMPARISON:  Chest radiographs 01/19/2013 and 05/19/2009. FINDINGS: The heart size is at the upper limits of normal. There is aortic atherosclerosis and mild vascular congestion. Small left pleural effusion and mild bibasilar atelectasis. No evidence of pneumothorax. There are at least 3 acute rib fractures on the left involving the 6th, 7th and 8th ribs anterolaterally. The 7th rib fracture is mildly displaced. Thoracolumbar spondylosis noted. Exclusion of mild thoracic compression deformities is difficult by this examination. If the patient has focal back pain, dedicated thoracic radiographs should be considered. IMPRESSION: 1. At least 3 acute left-sided rib fractures as described with small left pleural effusion. No evidence of pneumothorax. 2. Borderline heart size with vascular congestion. 3. Potential thoracic compression deformities, suboptimally assessed on these views. Dedicated thoracic radiographs should be considered if clinically warranted. Electronically Signed   By: Carey Bullocks M.D.   On: 05/23/2022 09:40   CT Head Wo Contrast  Result Date: 05/22/2022 CLINICAL DATA:  Head trauma,  moderate-severe; Neck trauma (Age >= 65y) EXAM: CT HEAD WITHOUT CONTRAST CT CERVICAL SPINE WITHOUT CONTRAST TECHNIQUE: Multidetector CT imaging of the head and cervical spine was performed following the standard protocol without intravenous contrast. Multiplanar CT image reconstructions of the cervical spine were also generated. RADIATION DOSE REDUCTION: This exam was performed according to the departmental dose-optimization program which includes automated exposure control, adjustment of the mA and/or kV according to patient size and/or use of iterative reconstruction technique. COMPARISON:  CT head 05/24/2021 FINDINGS: CT HEAD FINDINGS BRAIN: BRAIN Cerebral ventricle sizes are concordant with the degree of cerebral volume loss. Patchy and confluent areas of decreased attenuation are noted throughout the deep and periventricular white matter of the cerebral hemispheres bilaterally, compatible with chronic microvascular ischemic disease. No evidence of large-territorial acute infarction. No parenchymal hemorrhage. No mass lesion. No extra-axial collection. No mass effect or midline shift. No hydrocephalus. Basilar cisterns are patent. Vascular: No hyperdense vessel. Atherosclerotic calcifications are present within the cavernous internal carotid arteries. Skull: No acute fracture or focal lesion. Mild bilateral temporomandibular joint degenerative changes. Sinuses/Orbits: Paranasal sinuses and mastoid air cells are clear.  Right lens replacement. Otherwise the orbits are unremarkable. Other: None. CT CERVICAL SPINE FINDINGS Alignment: Normal. Skull base and vertebrae: Multilevel moderate severe degenerative changes of the spine. No associated severe osseous neural foraminal or central canal stenosis. No acute fracture. No aggressive appearing focal osseous lesion or focal pathologic process. Soft tissues and spinal canal: No prevertebral fluid or swelling. No visible canal hematoma. Upper chest: Mild emphysematous  changes. Other: None. IMPRESSION: 1. No acute intracranial abnormality. 2. No acute displaced fracture or traumatic listhesis of the cervical spine. 3.  Emphysema (ICD10-J43.9). Electronically Signed   By: Iven Finn M.D.   On: 05/22/2022 23:52   CT Cervical Spine Wo Contrast  Result Date: 05/22/2022 CLINICAL DATA:  Head trauma, moderate-severe; Neck trauma (Age >= 65y) EXAM: CT HEAD WITHOUT CONTRAST CT CERVICAL SPINE WITHOUT CONTRAST TECHNIQUE: Multidetector CT imaging of the head and cervical spine was performed following the standard protocol without intravenous contrast. Multiplanar CT image reconstructions of the cervical spine were also generated. RADIATION DOSE REDUCTION: This exam was performed according to the departmental dose-optimization program which includes automated exposure control, adjustment of the mA and/or kV according to patient size and/or use of iterative reconstruction technique. COMPARISON:  CT head 05/24/2021 FINDINGS: CT HEAD FINDINGS BRAIN: BRAIN Cerebral ventricle sizes are concordant with the degree of cerebral volume loss. Patchy and confluent areas of decreased attenuation are noted throughout the deep and periventricular white matter of the cerebral hemispheres bilaterally, compatible with chronic microvascular ischemic disease. No evidence of large-territorial acute infarction. No parenchymal hemorrhage. No mass lesion. No extra-axial collection. No mass effect or midline shift. No hydrocephalus. Basilar cisterns are patent. Vascular: No hyperdense vessel. Atherosclerotic calcifications are present within the cavernous internal carotid arteries. Skull: No acute fracture or focal lesion. Mild bilateral temporomandibular joint degenerative changes. Sinuses/Orbits: Paranasal sinuses and mastoid air cells are clear. Right lens replacement. Otherwise the orbits are unremarkable. Other: None. CT CERVICAL SPINE FINDINGS Alignment: Normal. Skull base and vertebrae: Multilevel  moderate severe degenerative changes of the spine. No associated severe osseous neural foraminal or central canal stenosis. No acute fracture. No aggressive appearing focal osseous lesion or focal pathologic process. Soft tissues and spinal canal: No prevertebral fluid or swelling. No visible canal hematoma. Upper chest: Mild emphysematous changes. Other: None. IMPRESSION: 1. No acute intracranial abnormality. 2. No acute displaced fracture or traumatic listhesis of the cervical spine. 3.  Emphysema (ICD10-J43.9). Electronically Signed   By: Iven Finn M.D.   On: 05/22/2022 23:52    EKG: Independently reviewed.  NSR with rate 70; no evidence of acute ischemia   Labs on Admission: I have personally reviewed the available labs and imaging studies at the time of the admission.  Pertinent labs:    Normal BMP Normal CBC    Assessment and Plan: Principal Problem:   Recurrent falls Active Problems:   Anxiety and depression   History of herpes simplex infection   Exudative age-related macular degeneration of right eye with inactive choroidal neovascularization (HCC)   Class 1 obesity due to excess calories with body mass index (BMI) of 30.0 to 30.9 in adult    Recurrent falls -Patient with chronic back pain and neck pain - reportedly with DDD but also with multiple compression fractures on imaging today -Also with h/o L Achilles tendon rupture that was not repaired -As a result, she has had increasingly poor mobility and is having recurrent falls -She has 4 current lateral rib fractures as well as a posterior rib fracture from  her most recent fall -Will observe on med surg  -PT and OT consults -Pain control with Tylenol, oxy, morphine as needed -Lidocaine patch ordered -If not able to mobilize due to back pain, consider IR evaluation for kyphoplasty (but fractures are not clearly acute) -Other than this, she appears to be quite healthy; her poor functional status is her only barrier to  surgery and she would likely be appropriate for surgery if needed  Mood d/o -Other than above, she appears to be quite healthy -Her only chronic meds are fluoxetine and lorazepam (plus daily valacyclovir)  Macular degeneration -Visual impairment in her right eye -No longer driving as a result  Obesity -Body mass index is 30.41 kg/m..  -Weight loss should be encouraged -Outpatient PCP/bariatric medicine f/u encouraged, as this may also help her functional status     Advance Care Planning:   Code Status: Full Code   Consults: PT/OT  DVT Prophylaxis: Lovenox  Family Communication: None present; I encouraged the patient to call her daughter to update her on the plan and offered to call afterwards should she have any additional questions  Severity of Illness: The appropriate patient status for this patient is OBSERVATION. Observation status is judged to be reasonable and necessary in order to provide the required intensity of service to ensure the patient's safety. The patient's presenting symptoms, physical exam findings, and initial radiographic and laboratory data in the context of their medical condition is felt to place them at decreased risk for further clinical deterioration. Furthermore, it is anticipated that the patient will be medically stable for discharge from the hospital within 2 midnights of admission.   Author: Karmen Bongo, MD 05/23/2022 5:57 PM  For on call review [email or web address].

## 2022-05-23 NOTE — ED Notes (Signed)
Dr Ophelia Charter at beside at this time to  evaluate the patient

## 2022-05-24 ENCOUNTER — Observation Stay (HOSPITAL_COMMUNITY): Payer: Medicare PPO

## 2022-05-24 DIAGNOSIS — S2242XA Multiple fractures of ribs, left side, initial encounter for closed fracture: Secondary | ICD-10-CM | POA: Diagnosis not present

## 2022-05-24 DIAGNOSIS — M545 Low back pain, unspecified: Secondary | ICD-10-CM

## 2022-05-24 DIAGNOSIS — M40204 Unspecified kyphosis, thoracic region: Secondary | ICD-10-CM | POA: Diagnosis not present

## 2022-05-24 DIAGNOSIS — M542 Cervicalgia: Secondary | ICD-10-CM

## 2022-05-24 DIAGNOSIS — M4316 Spondylolisthesis, lumbar region: Secondary | ICD-10-CM | POA: Diagnosis not present

## 2022-05-24 DIAGNOSIS — S32010A Wedge compression fracture of first lumbar vertebra, initial encounter for closed fracture: Secondary | ICD-10-CM | POA: Diagnosis not present

## 2022-05-24 DIAGNOSIS — M4802 Spinal stenosis, cervical region: Secondary | ICD-10-CM | POA: Diagnosis not present

## 2022-05-24 DIAGNOSIS — M50221 Other cervical disc displacement at C4-C5 level: Secondary | ICD-10-CM | POA: Diagnosis not present

## 2022-05-24 DIAGNOSIS — M50222 Other cervical disc displacement at C5-C6 level: Secondary | ICD-10-CM | POA: Diagnosis not present

## 2022-05-24 LAB — BASIC METABOLIC PANEL
Anion gap: 8 (ref 5–15)
BUN: 16 mg/dL (ref 8–23)
CO2: 26 mmol/L (ref 22–32)
Calcium: 9 mg/dL (ref 8.9–10.3)
Chloride: 104 mmol/L (ref 98–111)
Creatinine, Ser: 0.62 mg/dL (ref 0.44–1.00)
GFR, Estimated: >60 mL/min (ref >60)
Glucose, Bld: 114 mg/dL — ABNORMAL HIGH (ref 70–99)
Potassium: 3.8 mmol/L (ref 3.5–5.1)
Sodium: 138 mmol/L (ref 135–145)

## 2022-05-24 LAB — CBC
HCT: 40.6 % (ref 36.0–46.0)
Hemoglobin: 13 g/dL (ref 12.0–15.0)
MCH: 28.5 pg (ref 26.0–34.0)
MCHC: 32 g/dL (ref 30.0–36.0)
MCV: 89 fL (ref 80.0–100.0)
Platelets: 164 10*3/uL (ref 150–400)
RBC: 4.56 MIL/uL (ref 3.87–5.11)
RDW: 12.8 % (ref 11.5–15.5)
WBC: 7 10*3/uL (ref 4.0–10.5)
nRBC: 0 % (ref 0.0–0.2)

## 2022-05-24 MED ORDER — ADULT MULTIVITAMIN W/MINERALS CH
1.0000 | ORAL_TABLET | Freq: Every day | ORAL | Status: DC
  Administered 2022-05-24 – 2022-05-28 (×5): 1 via ORAL
  Filled 2022-05-24 (×5): qty 1

## 2022-05-24 MED ORDER — ENSURE ENLIVE PO LIQD
237.0000 mL | Freq: Two times a day (BID) | ORAL | Status: DC
  Administered 2022-05-24 – 2022-05-25 (×2): 237 mL via ORAL

## 2022-05-24 MED ORDER — METHOCARBAMOL 1000 MG/10ML IJ SOLN: 500.0000 mg | Freq: Four times a day (QID) | INTRAVENOUS | Status: DC | PRN

## 2022-05-24 MED ORDER — METHOCARBAMOL 500 MG PO TABS
500.0000 mg | ORAL_TABLET | Freq: Three times a day (TID) | ORAL | Status: AC
  Administered 2022-05-24 – 2022-05-25 (×6): 500 mg via ORAL
  Filled 2022-05-24 (×6): qty 1

## 2022-05-24 NOTE — Progress Notes (Signed)
PT Cancellation Note  Patient Details Name: TAMICA COVELL MRN: [id number] DOB: Sep 29, 1940   Cancelled Treatment:    Reason Eval/Treat Not Completed: Medical issues which prohibited therapy. PT holding mobility at this time, awaiting further updates on medical plan after MRI reading. PT will follow up tomorrow.    Arlyss Gandy 05/24/2022, 5:20 PM

## 2022-05-24 NOTE — Care Management Obs Status (Cosign Needed)
MEDICARE OBSERVATION STATUS NOTIFICATION   Patient Details  Name: [name] MRN: [id number] Date of Birth: Nov 19, 1940   Medicare Observation Status Notification Given:  Yes    Janae Bridgeman, RN 05/24/2022, 3:26 PM

## 2022-05-24 NOTE — Progress Notes (Signed)
PT Cancellation Note  Patient Details Name: [name] MRN: [id number] DOB: 08-23-1940   Cancelled Treatment:    Reason Eval/Treat Not Completed: Patient at procedure or test/unavailable (Pt in MRI. Will check back as able.)   [name] 05/24/2022, 10:37 AM Master Touchet M,PT Acute Rehab Services [phone]

## 2022-05-24 NOTE — Progress Notes (Signed)
[facility] PROGRESS NOTE   [name] SFK:[id number] DOB: 06/17/1941 DOA: 05/22/2022  PCP: Thana Ates, MD  Brief History/Interval Summary: 81 y.o. female with medical history significant of anxiety and neck pain presenting with a fall.   She reports that in 2021 she had an Achilles rupture; she went to podiatry Karren Burly, DPM) and he reportedly told her that she was too chronically ill for surgical repair.  Instead, he sent her to PT and had her wear a boot and it eventually healed.  However, in the intervening 2 years she has become increasingly weak and needs assistive devices to get around even her house (mostly uses a cane for convenience).  Meanwhile, her neck is also weak and she has been to see Dr. Shon Baton who reportedly told her she has degenerative disease of the cervical and lumbar spine and referred her to pain management for injection because he agreed that she is not a surgical candidate.  She has had a number of recent falls from her legs giving out.  This happened the night prior to admission and she had significant left-sided rib pain as a result of fractures.   Consultants: None yet  Procedures: None yet    Subjective/Interval History: Patient complains of neck pain and upper and lower back pain.  Mentions weakness in her lower extremities.  Has been finding it difficult to ambulate.    Assessment/Plan:  Back pain/neck pain/multiple compression fractures Patient with chronic history of neck and back pain but worse over the past few days due to recurrent falls.  She also mention lower extremity weakness. CT cervical spine did not show any acute fractures.  She had a CT scan of the chest abdomen and pelvis which showed compression fractures of T8, T12 and L4 1 although this was age-indeterminate. Patient does have weakness in bilateral lower extremities.  Her orthopedic doctors office was called, Dr. Shon Baton.  No recent MRIs have been done on this  patient in their office. Based on care everywhere looks like she has had an MRI Lumbar spine in May of this year which showed mild progression of multilevel degenerative disease.  There was no mention of any compression fractures at that time. Considering patient's frequent falls especially over the last few weeks and her lower extremity weakness it would be prudent to proceed with MRI of the cervical thoracic and lumbar spine. PT and OT evaluation.  Pain control.  Rib fracture Patient noted to have fractures involving the left side.  No pneumothorax was noted. Incentive spirometry.  Pain control.  Repeat chest x-ray tomorrow.  Recurrent falls See above for details.  PT and OT evaluation.  Will check her B12 levels.  Mood disorder Continue home medications.  Macular degeneration Stable.  Obesity Estimated body mass index is 30.41 kg/m as calculated from the following:   Height as of this encounter: 5\' 8"  (1.727 m).   Weight as of this encounter: 90.7 kg.  DVT Prophylaxis: Lovenox Code Status: Full code Family Communication: Discussed with patient Disposition Plan: To be determined  Status is: Observation The patient will require care spanning > 2 midnights and should be moved to inpatient because: Persistent back pain limiting mobility      Medications: Scheduled:  docusate sodium  100 mg Oral BID   enoxaparin (LOVENOX) injection  40 mg Subcutaneous Daily   feeding supplement  237 mL Oral BID BM   FLUoxetine  60 mg Oral Daily   lidocaine  1 patch Transdermal  Q24H   methocarbamol  500 mg Oral TID   multivitamin with minerals  1 tablet Oral Daily   valACYclovir  500 mg Oral Daily   Continuous:  methocarbamol (ROBAXIN) IV     YJE:[id number] **OR** acetaminophen, bisacodyl, hydrALAZINE, LORazepam, methocarbamol (ROBAXIN) IV, morphine injection, ondansetron **OR** ondansetron (ZOFRAN) IV, oxyCODONE, polyethylene glycol  Antibiotics: Anti-infectives (From admission,  onward)    Start     Dose/Rate Route Frequency Ordered Stop   05/24/22 1000  valACYclovir (VALTREX) tablet 500 mg        500 mg Oral Daily 05/23/22 1700         Objective:  Vital Signs  Vitals:   05/23/22 1835 05/23/22 1956 05/24/22 0439 05/24/22 0735  BP: (!) 159/89 (!) 158/85 138/76 (!) 168/73  Pulse: 94 95 75 80  Resp: 18 19 15 18   Temp: 98.1 F (36.7 C) 98 F (36.7 C) 98.9 F (37.2 C) 98 F (36.7 C)  TempSrc: Oral Oral  Oral  SpO2: 90% 96%  92%  Weight:      Height:       No intake or output data in the 24 hours ending 05/24/22 0926 Filed Weights   05/22/22 2203  Weight: 90.7 kg    General appearance: Awake alert.  In no distress Resp: Clear to auscultation bilaterally.  Normal effort Cardio: S1-S2 is normal regular.  No S3-S4.  No rubs murmurs or bruit GI: Abdomen is soft.  Nontender nondistended.  Bowel sounds are present normal.  No masses organomegaly Extremities: No edema.  Significant physical deconditioning noted especially in the lower extremities Neurologic: Alert and oriented x3.  Able to move both upper extremities without difficulty.  Significant motor weakness noted in the lower extremities bilaterally.   Lab Results:  Data Reviewed: I have personally reviewed following labs and reports of the imaging studies  CBC: Recent Labs  Lab 05/23/22 1137 05/23/22 1144 05/24/22 0455  WBC 7.9  --  7.0  NEUTROABS 5.4  --   --   HGB 14.3 15.3* 13.0  HCT 45.2 45.0 40.6  MCV 89.3  --  89.0  PLT 187  --  164    Basic Metabolic Panel: Recent Labs  Lab 05/23/22 1137 05/23/22 1144 05/24/22 0455  NA 141 141 138  K 3.6 3.7 3.8  CL 103 103 104  CO2 29  --  26  GLUCOSE 89 89 114*  BUN 12 14 16   CREATININE 0.61 0.50 0.62  CALCIUM 9.6  --  9.0    GFR: Estimated Creatinine Clearance: 63.9 mL/min (by C-G formula based on SCr of 0.62 mg/dL).   Radiology Studies: CT CHEST ABDOMEN PELVIS W CONTRAST  Result Date: 05/23/2022 CLINICAL DATA:  Status  post fall. EXAM: CT CHEST, ABDOMEN, AND PELVIS WITH CONTRAST TECHNIQUE: Multidetector CT imaging of the chest, abdomen and pelvis was performed following the standard protocol during bolus administration of intravenous contrast. RADIATION DOSE REDUCTION: This exam was performed according to the departmental dose-optimization program which includes automated exposure control, adjustment of the mA and/or kV according to patient size and/or use of iterative reconstruction technique. CONTRAST:  82mL OMNIPAQUE IOHEXOL 350 MG/ML SOLN COMPARISON:  None Available. FINDINGS: CT CHEST FINDINGS Cardiovascular: Heart size is mildly enlarged. No pericardial effusion. Aortic atherosclerosis and coronary artery calcifications. Mediastinum/Nodes: No enlarged mediastinal, hilar, or axillary lymph nodes. Thyroid gland, trachea, and esophagus demonstrate no significant findings. Lungs/Pleura: Mild centrilobular and paraseptal emphysema with diffuse bronchial wall thickening. No pleural fluid. Subpleural consolidation is noted overlying  bilateral lung bases along with areas of subsegmental atelectasis and volume loss. No suspicious pulmonary nodule or mass. Musculoskeletal: Fractures are identified involving the lateral aspect of the left forth, sixth, seventh, and eighth ribs. There is also a fracture involving the posterior aspect of the left eleventh rib. There are age indeterminate compression deformities involving the T8, T12, and L1 vertebra. Most advanced at the L1 level where there is greater than 50% loss of the vertebral body height. Schmorl's node deformity involves the superior endplate of T11. CT ABDOMEN PELVIS FINDINGS Hepatobiliary: No hepatic injury or perihepatic hematoma. Cyst within posterior right lobe of liver measures 1.2 cm. Left lobe of liver cyst measures 1.1 cm. Several, subcentimeter, too small to characterize low-density liver lesions are also noted. Pancreas: Unremarkable. No pancreatic ductal dilatation or  surrounding inflammatory changes. Spleen: No splenic injury or perisplenic hematoma. Adrenals/Urinary Tract: Normal adrenal glands. No nephrolithiasis, hydronephrosis or suspicious mass. Urinary bladder is unremarkable. Stomach/Bowel: Stomach is within normal limits. No evidence of bowel wall thickening, distention, or inflammatory changes. Vascular/Lymphatic: Aortic atherosclerosis. No abdominopelvic adenopathy. Reproductive: Pessary device is identified within the vaginal apex. Uterus and adnexal structures are unremarkable. Other: No free fluid or fluid collections. Musculoskeletal: Age-indeterminate fracture deformity involving the L1 vertebra is noted with loss of approximately 60% of the vertebral body height. Anterolisthesis of L4 on L5 measures 6 mm. Moderate degenerative disc disease with vacuum disc noted at L4-5. IMPRESSION: 1. Acute fractures involving the lateral aspect of the left forth, sixth, seventh, and eighth ribs. There is also a fracture involving the posterior aspect of the left eleventh rib. 2. Bilateral subpleural consolidation and atelectasis identified within both lung bases. No pleural fluid noted. 3. Age-indeterminate compression deformities involving the T8, T12, and L1 vertebra. Most severe at the L1 level where there is greater than 50% loss of the vertebral body height. 4. No signs of solid organ injury within the chest, abdomen or pelvis. 5. Aortic Atherosclerosis (ICD10-I70.0) and Emphysema (ICD10-J43.9). Electronically Signed   By: Signa Kell M.D.   On: 05/23/2022 16:15   DG Pelvis 1-2 Views  Result Date: 05/23/2022 CLINICAL DATA:  Status post fall. EXAM: PELVIS - 1-2 VIEW COMPARISON:  None Available. FINDINGS: The bones appear mildly demineralized. There is no evidence of acute fracture or dislocation. Mild symmetric degenerative changes at both hips and sacroiliac joints. The soft tissues appear unremarkable. IMPRESSION: No evidence of acute fracture or dislocation. Mild  symmetric degenerative changes. Electronically Signed   By: Carey Bullocks M.D.   On: 05/23/2022 09:41   DG Ribs Unilateral W/Chest Left  Result Date: 05/23/2022 CLINICAL DATA:  Weakness.  Fall.  Left chest pain. EXAM: LEFT RIBS AND CHEST - 3+ VIEW COMPARISON:  Chest radiographs 01/19/2013 and 05/19/2009. FINDINGS: The heart size is at the upper limits of normal. There is aortic atherosclerosis and mild vascular congestion. Small left pleural effusion and mild bibasilar atelectasis. No evidence of pneumothorax. There are at least 3 acute rib fractures on the left involving the 6th, 7th and 8th ribs anterolaterally. The 7th rib fracture is mildly displaced. Thoracolumbar spondylosis noted. Exclusion of mild thoracic compression deformities is difficult by this examination. If the patient has focal back pain, dedicated thoracic radiographs should be considered. IMPRESSION: 1. At least 3 acute left-sided rib fractures as described with small left pleural effusion. No evidence of pneumothorax. 2. Borderline heart size with vascular congestion. 3. Potential thoracic compression deformities, suboptimally assessed on these views. Dedicated thoracic radiographs should be considered if  clinically warranted. Electronically Signed   By: Carey Bullocks M.D.   On: 05/23/2022 09:40   CT Head Wo Contrast  Result Date: 05/22/2022 CLINICAL DATA:  Head trauma, moderate-severe; Neck trauma (Age >= 65y) EXAM: CT HEAD WITHOUT CONTRAST CT CERVICAL SPINE WITHOUT CONTRAST TECHNIQUE: Multidetector CT imaging of the head and cervical spine was performed following the standard protocol without intravenous contrast. Multiplanar CT image reconstructions of the cervical spine were also generated. RADIATION DOSE REDUCTION: This exam was performed according to the departmental dose-optimization program which includes automated exposure control, adjustment of the mA and/or kV according to patient size and/or use of iterative  reconstruction technique. COMPARISON:  CT head 05/24/2021 FINDINGS: CT HEAD FINDINGS BRAIN: BRAIN Cerebral ventricle sizes are concordant with the degree of cerebral volume loss. Patchy and confluent areas of decreased attenuation are noted throughout the deep and periventricular white matter of the cerebral hemispheres bilaterally, compatible with chronic microvascular ischemic disease. No evidence of large-territorial acute infarction. No parenchymal hemorrhage. No mass lesion. No extra-axial collection. No mass effect or midline shift. No hydrocephalus. Basilar cisterns are patent. Vascular: No hyperdense vessel. Atherosclerotic calcifications are present within the cavernous internal carotid arteries. Skull: No acute fracture or focal lesion. Mild bilateral temporomandibular joint degenerative changes. Sinuses/Orbits: Paranasal sinuses and mastoid air cells are clear. Right lens replacement. Otherwise the orbits are unremarkable. Other: None. CT CERVICAL SPINE FINDINGS Alignment: Normal. Skull base and vertebrae: Multilevel moderate severe degenerative changes of the spine. No associated severe osseous neural foraminal or central canal stenosis. No acute fracture. No aggressive appearing focal osseous lesion or focal pathologic process. Soft tissues and spinal canal: No prevertebral fluid or swelling. No visible canal hematoma. Upper chest: Mild emphysematous changes. Other: None. IMPRESSION: 1. No acute intracranial abnormality. 2. No acute displaced fracture or traumatic listhesis of the cervical spine. 3.  Emphysema (ICD10-J43.9). Electronically Signed   By: Tish Frederickson M.D.   On: 05/22/2022 23:52   CT Cervical Spine Wo Contrast  Result Date: 05/22/2022 CLINICAL DATA:  Head trauma, moderate-severe; Neck trauma (Age >= 65y) EXAM: CT HEAD WITHOUT CONTRAST CT CERVICAL SPINE WITHOUT CONTRAST TECHNIQUE: Multidetector CT imaging of the head and cervical spine was performed following the standard protocol  without intravenous contrast. Multiplanar CT image reconstructions of the cervical spine were also generated. RADIATION DOSE REDUCTION: This exam was performed according to the departmental dose-optimization program which includes automated exposure control, adjustment of the mA and/or kV according to patient size and/or use of iterative reconstruction technique. COMPARISON:  CT head 05/24/2021 FINDINGS: CT HEAD FINDINGS BRAIN: BRAIN Cerebral ventricle sizes are concordant with the degree of cerebral volume loss. Patchy and confluent areas of decreased attenuation are noted throughout the deep and periventricular white matter of the cerebral hemispheres bilaterally, compatible with chronic microvascular ischemic disease. No evidence of large-territorial acute infarction. No parenchymal hemorrhage. No mass lesion. No extra-axial collection. No mass effect or midline shift. No hydrocephalus. Basilar cisterns are patent. Vascular: No hyperdense vessel. Atherosclerotic calcifications are present within the cavernous internal carotid arteries. Skull: No acute fracture or focal lesion. Mild bilateral temporomandibular joint degenerative changes. Sinuses/Orbits: Paranasal sinuses and mastoid air cells are clear. Right lens replacement. Otherwise the orbits are unremarkable. Other: None. CT CERVICAL SPINE FINDINGS Alignment: Normal. Skull base and vertebrae: Multilevel moderate severe degenerative changes of the spine. No associated severe osseous neural foraminal or central canal stenosis. No acute fracture. No aggressive appearing focal osseous lesion or focal pathologic process. Soft tissues and spinal canal: No  prevertebral fluid or swelling. No visible canal hematoma. Upper chest: Mild emphysematous changes. Other: None. IMPRESSION: 1. No acute intracranial abnormality. 2. No acute displaced fracture or traumatic listhesis of the cervical spine. 3.  Emphysema (ICD10-J43.9). Electronically Signed   By: Tish FredericksonMorgane  Naveau  M.D.   On: 05/22/2022 23:52       LOS: 0 days   Jood Retana Rito EhrlichKrishnan  [facility] Pager on [email or web address]  05/24/2022, 9:26 AM

## 2022-05-24 NOTE — Progress Notes (Signed)
PT Cancellation Note  Patient Details Name: [name] MRN: [id number] DOB: 1940-08-10   Cancelled Treatment:    Reason Eval/Treat Not Completed: Patient at procedure or test/unavailable (Pt still in MRI.  Will check back as able.)   [name] 05/24/2022, 1:40 PM [name],PT [facility] Re[facility] [phone]

## 2022-05-24 NOTE — Consult Note (Signed)
Reason for Consult:falls Referring Physician: [name] is an 81 y.o. female.  HPI: whom is taken care of by Dr. Rolena Infante, spinal orthopaedist. I was called by mistake as I was on call for neurosurgery not ortopaedics. Dr. Rolena Infante and I discussed [name]. My visit today can serve as a second opinion. She has had multiple falls and was admitted after breaking ribs on her left side. An Mri of the entire spine was performed. The relevant finding was cervical stenosis.   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/2019   Cystoid macular edema of right eye 09/24/2019   Detrusor instability    GERD (gastroesophageal reflux disease)    Osteoporosis    Psoriasis    Sciatic pain    Uterine prolapse     Past Surgical History:  Procedure Laterality Date   APPENDECTOMY     FOOT SURGERY     KNEE SURGERY     TOTAL KNEE ARTHROPLASTY Bilateral    X2-- 2009; 2010   TOTAL KNEE REVISION Left 01/23/2013   Procedure:  TOTAL KNEE ARTHROPLASTY REVISION ;  Surgeon: Gearlean Alf, MD;  Location: [facility];  Service: Orthopedics;  Laterality: Left;    Family History  Problem Relation Age of Onset   Cancer Mother        GALLBLADDER    Breast cancer Cousin        Mat. 1st cousin Age 42    Social History:  reports that she quit smoking about 6 years ago. Her smoking use included cigarettes. She smoked an average of .2 packs per day. She has never used smokeless tobacco. She reports that she does not drink alcohol and does not use drugs.  Allergies: No Known Allergies  Medications: I have reviewed the patient's current medications.  Results for orders placed or performed during the hospital encounter of 05/22/22 (from the past 48 hour(s))  CBC with Differential     Status: None   Collection Time: 05/23/22 11:37 AM  Result Value Ref Range   WBC 7.9 4.0 - 10.5 K/uL    RBC 5.06 3.87 - 5.11 MIL/uL   Hemoglobin 14.3 12.0 - 15.0 g/dL   HCT 45.2 36.0 - 46.0 %   MCV 89.3 80.0 - 100.0 fL   MCH 28.3 26.0 - 34.0 pg   MCHC 31.6 30.0 - 36.0 g/dL   RDW 13.1 11.5 - 15.5 %   Platelets 187 150 - 400 K/uL   nRBC 0.0 0.0 - 0.2 %   Neutrophils Relative % 69 %   Neutro Abs 5.4 1.7 - 7.7 K/uL   Lymphocytes Relative 21 %   Lymphs Abs 1.7 0.7 - 4.0 K/uL   Monocytes Relative 8 %   Monocytes Absolute 0.6 0.1 - 1.0 K/uL   Eosinophils Relative 1 %   Eosinophils Absolute 0.1 0.0 - 0.5 K/uL   Basophils Relative 1 %   Basophils Absolute 0.1 0.0 - 0.1 K/uL   Immature Granulocytes 0 %   Abs Immature Granulocytes 0.02 0.00 - 0.07 K/uL    Comment: Performed at [facility], [address], [place], [place] [id number]  Basic metabolic panel     Status: None   Collection Time: 05/23/22 11:37 AM  Result Value Ref Range   Sodium 141 135 - 145 mmol/L   Potassium 3.6 3.5 -  5.1 mmol/L   Chloride 103 98 - 111 mmol/L   CO2 29 22 - 32 mmol/L   Glucose, Bld 89 70 - 99 mg/dL    Comment: Glucose reference range applies only to samples taken after fasting for at least 8 hours.   BUN 12 8 - 23 mg/dL   Creatinine, Ser 0.61 0.44 - 1.00 mg/dL   Calcium 9.6 8.9 - 10.3 mg/dL   GFR, Estimated >60 >60 mL/min    Comment: (NOTE) Calculated using the CKD-EPI Creatinine Equation (2021)    Anion gap 9 5 - 15    Comment: Performed at [facility] [address] [place], [place] [address]  I-stat chem 8, ED (not at [facility] or [facility])     Status: Abnormal   Collection Time: 05/23/22 11:44 AM  Result Value Ref Range   Sodium 141 135 - 145 mmol/L   Potassium 3.7 3.5 - 5.1 mmol/L   Chloride 103 98 - 111 mmol/L   BUN 14 8 - 23 mg/dL   Creatinine, Ser 0.50 0.44 - 1.00 mg/dL   Glucose, Bld 89 70 - 99 mg/dL    Comment: Glucose reference range applies only to samples taken after fasting for at least 8 hours.   Calcium, Ion 1.13 (L) 1.15 - 1.40 mmol/L   TCO2 29 22 - 32 mmol/L   Hemoglobin  15.3 (H) 12.0 - 15.0 g/dL   HCT 45.0 36.0 - [id number] %  Basic metabolic panel     Status: Abnormal   Collection Time: 05/24/22  4:55 AM  Result Value Ref Range   Sodium 138 135 - 145 mmol/L   Potassium 3.8 3.5 - 5.1 mmol/L   Chloride 104 98 - 111 mmol/L   CO2 26 22 - 32 mmol/L   Glucose, Bld 114 (H) 70 - 99 mg/dL    Comment: Glucose reference range applies only to samples taken after fasting for at least 8 hours.   BUN 16 8 - 23 mg/dL   Creatinine, Ser 0.62 0.44 - 1.00 mg/dL   Calcium 9.0 8.9 - 10.3 mg/dL   GFR, Estimated >60 >60 mL/min    Comment: (NOTE) Calculated using the CKD-EPI Creatinine Equation (2021)    Anion gap 8 5 - 15    Comment: Performed at [facility] [address] [place], [place] [address]  CBC     Status: None   Collection Time: 05/24/22  4:55 AM  Result Value Ref Range   WBC 7.0 4.0 - 10.5 K/uL   RBC 4.56 3.87 - 5.11 MIL/uL   Hemoglobin 13.0 12.0 - 15.0 g/dL   HCT 40.6 36.0 - 46.0 %   MCV 89.0 80.0 - 100.0 fL   MCH 28.5 26.0 - 34.0 pg   MCHC 32.0 30.0 - 36.0 g/dL   RDW 12.8 11.5 - 15.5 %   Platelets 164 150 - 400 K/uL   nRBC 0.0 0.0 - 0.2 %    Comment: Performed at [facility], [place] [address], [place], [place] [address]    MR CERVICAL SPINE WO CONTRAST  Result Date: 05/24/2022 CLINICAL DATA:  Myelopathy EXAM: MRI CERVICAL, THORACIC AND LUMBAR SPINE WITHOUT CONTRAST TECHNIQUE: Multiplanar and multiecho pulse sequences of the cervical spine, to include the craniocervical junction and cervicothoracic junction, and thoracic and lumbar spine, were obtained without intravenous contrast. COMPARISON:  MRI lumbar spine 10/27/2011, CT cervical spine 05/24/2022, correlation is also made with CT chest abdomen pelvis 05/23/2022 FINDINGS: Evaluation is somewhat limited by  motion artifact. MRI CERVICAL SPINE FINDINGS Alignment: Trace anterolisthesis of C6 on C7 Vertebrae: No fracture, evidence of discitis, or bone lesion. Congenitally short pedicles, which  narrow the AP diameter of the spinal canal. Cord: Normal cord signal. Multilevel cord flattening throughout the cervical spine. Posterior Fossa, vertebral arteries, paraspinal tissues: Negative. Disc levels: C2-C3: Minimal disc bulge. Facet arthropathy. Ligamentum flavum hypertrophy. Mild spinal canal stenosis. No neural foraminal narrowing. C3-C4: Mild disc bulge with central protrusion. Facet and uncovertebral hypertrophy. Ligamentum flavum hypertrophy. The disc bulge indents and deforms the ventral spinal cord, with cord flattening. Moderate to severe spinal canal stenosis. Moderate right neural foraminal narrowing. C4-C5: Disc height loss with disc bulge, which causes cord flattening. Facet and uncovertebral hypertrophy. Ligamentum flavum hypertrophy. Severe spinal canal stenosis. Mild bilateral neural foraminal narrowing. C5-C6: Disc height loss with mild disc bulge, which causes cord flattening. Facet and uncovertebral hypertrophy. Ligamentum flavum hypertrophy. Moderate to severe spinal canal stenosis. Mild right neural foraminal narrowing. C6-C7: Disc bulge with superimposed central protrusion which indents and deforms the spinal cord. Facet and uncovertebral hypertrophy. Ligamentum flavum hypertrophy. Severe spinal canal stenosis. Moderate right neural foraminal narrowing. C7-T1: Mild disc bulge. Facet and uncovertebral hypertrophy. No spinal canal stenosis. Mild right neural foraminal narrowing. MRI THORACIC SPINE FINDINGS Alignment: Trace anterolisthesis of T2 on T3. Exaggeration of the normal thoracic kyphosis secondary to wedging midthoracic vertebral bodies S shaped curvature of the thoracolumbar spine, with levocurvature of the mid thoracic spine. Vertebrae: No suspicious osseous lesion. Acute to subacute compression T12, with approximately 20% vertebral body height loss. Subacute compression fracture of T11 with 10% vertebral body height loss. No significant retropulsion. Wedging of T5, T6, T7, and  T8, without associated edema, likely remote compression fractures. Cord:  Normal signal and morphology. Paraspinal and other soft tissues: Trace bilateral pleural effusions with associated atelectasis. Right hepatic lobe cysts, for which no follow-up is currently indicated. Disc levels: No significant spinal canal stenosis or neural foraminal narrowing. MRI LUMBAR SPINE FINDINGS Segmentation:  5 lumbar type vertebral bodies. Alignment: Mild dextrocurvature of the lumbar spine. 3 mm anterolisthesis of T12 on L1 and 6 mm anterolisthesis of L4 on L5. Vertebrae: Subacute to chronic compression fracture of L1, with approximately 50% vertebral body height loss. No significant retropulsion. No acute fracture or suspicious osseous lesion. Conus medullaris and cauda equina: Conus extends to the L1-L2 level. Conus and cauda equina appear normal. Paraspinal and other soft tissues: Negative. Disc levels: T12-L1: Trace anterolisthesis with disc unroofing. Mild facet arthropathy. No spinal canal stenosis or neural foraminal narrowing. L1-L2: Mild disc bulge. Mild facet arthropathy. No spinal canal stenosis or neural foraminal narrowing. L2-L3: Mild disc bulge. Mild facet arthropathy. No spinal canal stenosis or neural foraminal narrowing. L3-L4: Mild disc bulge. Moderate facet arthropathy. Narrowing of the lateral recesses. No spinal canal stenosis or neural foraminal narrowing. L4-L5: Grade 1 anterolisthesis with disc unroofing. Moderate facet arthropathy. Narrowing of the lateral recesses. No spinal canal stenosis or neural foraminal narrowing. L5-S1: Mild disc bulge. Moderate facet arthropathy. No spinal canal stenosis or neural foraminal narrowing. IMPRESSION: 1. Evaluation is somewhat limited by motion artifact. Within this limitation, there is an acute to subacute compression fracture of T12, with approximately 20% vertebral body height loss, and a subacute compression fracture of T11, with approximately 10% vertebral body  loss. No retropulsion or associated spinal canal stenosis. 2. Congenitally short pedicles and multilevel degenerative changes in the cervical spine, which cause severe spinal canal stenosis at C4-C5 and C6-C7 and moderate to  severe spinal canal stenosis at C3-C4 and C5-C6. Multilevel cord flattening without cord signal abnormality. 3. Multilevel neural foraminal narrowing, which is moderate on the right at C3-C4 and C6-C7, mild bilaterally at C4-C5, and mild on the right at C5-C6 and C7-T1. 4. No significant spinal canal stenosis or neural foraminal narrowing in the thoracic or lumbar spine. 5. Narrowing of the lateral recesses at L3-L4 and L4-L5 could affect the descending L4 and L5 nerve roots, respectively. 6. Trace bilateral pleural effusions with associated atelectasis. Electronically Signed   By: Merilyn Baba M.D.   On: 05/24/2022 13:02   MR THORACIC SPINE WO CONTRAST  Result Date: 05/24/2022 CLINICAL DATA:  Myelopathy EXAM: MRI CERVICAL, THORACIC AND LUMBAR SPINE WITHOUT CONTRAST TECHNIQUE: Multiplanar and multiecho pulse sequences of the cervical spine, to include the craniocervical junction and cervicothoracic junction, and thoracic and lumbar spine, were obtained without intravenous contrast. COMPARISON:  MRI lumbar spine 10/27/2011, CT cervical spine 05/24/2022, correlation is also made with CT chest abdomen pelvis 05/23/2022 FINDINGS: Evaluation is somewhat limited by motion artifact. MRI CERVICAL SPINE FINDINGS Alignment: Trace anterolisthesis of C6 on C7 Vertebrae: No fracture, evidence of discitis, or bone lesion. Congenitally short pedicles, which narrow the AP diameter of the spinal canal. Cord: Normal cord signal. Multilevel cord flattening throughout the cervical spine. Posterior Fossa, vertebral arteries, paraspinal tissues: Negative. Disc levels: C2-C3: Minimal disc bulge. Facet arthropathy. Ligamentum flavum hypertrophy. Mild spinal canal stenosis. No neural foraminal narrowing. C3-C4:  Mild disc bulge with central protrusion. Facet and uncovertebral hypertrophy. Ligamentum flavum hypertrophy. The disc bulge indents and deforms the ventral spinal cord, with cord flattening. Moderate to severe spinal canal stenosis. Moderate right neural foraminal narrowing. C4-C5: Disc height loss with disc bulge, which causes cord flattening. Facet and uncovertebral hypertrophy. Ligamentum flavum hypertrophy. Severe spinal canal stenosis. Mild bilateral neural foraminal narrowing. C5-C6: Disc height loss with mild disc bulge, which causes cord flattening. Facet and uncovertebral hypertrophy. Ligamentum flavum hypertrophy. Moderate to severe spinal canal stenosis. Mild right neural foraminal narrowing. C6-C7: Disc bulge with superimposed central protrusion which indents and deforms the spinal cord. Facet and uncovertebral hypertrophy. Ligamentum flavum hypertrophy. Severe spinal canal stenosis. Moderate right neural foraminal narrowing. C7-T1: Mild disc bulge. Facet and uncovertebral hypertrophy. No spinal canal stenosis. Mild right neural foraminal narrowing. MRI THORACIC SPINE FINDINGS Alignment: Trace anterolisthesis of T2 on T3. Exaggeration of the normal thoracic kyphosis secondary to wedging midthoracic vertebral bodies S shaped curvature of the thoracolumbar spine, with levocurvature of the mid thoracic spine. Vertebrae: No suspicious osseous lesion. Acute to subacute compression T12, with approximately 20% vertebral body height loss. Subacute compression fracture of T11 with 10% vertebral body height loss. No significant retropulsion. Wedging of T5, T6, T7, and T8, without associated edema, likely remote compression fractures. Cord:  Normal signal and morphology. Paraspinal and other soft tissues: Trace bilateral pleural effusions with associated atelectasis. Right hepatic lobe cysts, for which no follow-up is currently indicated. Disc levels: No significant spinal canal stenosis or neural foraminal  narrowing. MRI LUMBAR SPINE FINDINGS Segmentation:  5 lumbar type vertebral bodies. Alignment: Mild dextrocurvature of the lumbar spine. 3 mm anterolisthesis of T12 on L1 and 6 mm anterolisthesis of L4 on L5. Vertebrae: Subacute to chronic compression fracture of L1, with approximately 50% vertebral body height loss. No significant retropulsion. No acute fracture or suspicious osseous lesion. Conus medullaris and cauda equina: Conus extends to the L1-L2 level. Conus and cauda equina appear normal. Paraspinal and other soft tissues: Negative. Disc levels: T12-L1:  Trace anterolisthesis with disc unroofing. Mild facet arthropathy. No spinal canal stenosis or neural foraminal narrowing. L1-L2: Mild disc bulge. Mild facet arthropathy. No spinal canal stenosis or neural foraminal narrowing. L2-L3: Mild disc bulge. Mild facet arthropathy. No spinal canal stenosis or neural foraminal narrowing. L3-L4: Mild disc bulge. Moderate facet arthropathy. Narrowing of the lateral recesses. No spinal canal stenosis or neural foraminal narrowing. L4-L5: Grade 1 anterolisthesis with disc unroofing. Moderate facet arthropathy. Narrowing of the lateral recesses. No spinal canal stenosis or neural foraminal narrowing. L5-S1: Mild disc bulge. Moderate facet arthropathy. No spinal canal stenosis or neural foraminal narrowing. IMPRESSION: 1. Evaluation is somewhat limited by motion artifact. Within this limitation, there is an acute to subacute compression fracture of T12, with approximately 20% vertebral body height loss, and a subacute compression fracture of T11, with approximately 10% vertebral body loss. No retropulsion or associated spinal canal stenosis. 2. Congenitally short pedicles and multilevel degenerative changes in the cervical spine, which cause severe spinal canal stenosis at C4-C5 and C6-C7 and moderate to severe spinal canal stenosis at C3-C4 and C5-C6. Multilevel cord flattening without cord signal abnormality. 3.  Multilevel neural foraminal narrowing, which is moderate on the right at C3-C4 and C6-C7, mild bilaterally at C4-C5, and mild on the right at C5-C6 and C7-T1. 4. No significant spinal canal stenosis or neural foraminal narrowing in the thoracic or lumbar spine. 5. Narrowing of the lateral recesses at L3-L4 and L4-L5 could affect the descending L4 and L5 nerve roots, respectively. 6. Trace bilateral pleural effusions with associated atelectasis. Electronically Signed   By: Merilyn Baba M.D.   On: 05/24/2022 13:02   MR LUMBAR SPINE WO CONTRAST  Result Date: 05/24/2022 CLINICAL DATA:  Myelopathy EXAM: MRI CERVICAL, THORACIC AND LUMBAR SPINE WITHOUT CONTRAST TECHNIQUE: Multiplanar and multiecho pulse sequences of the cervical spine, to include the craniocervical junction and cervicothoracic junction, and thoracic and lumbar spine, were obtained without intravenous contrast. COMPARISON:  MRI lumbar spine 10/27/2011, CT cervical spine 05/24/2022, correlation is also made with CT chest abdomen pelvis 05/23/2022 FINDINGS: Evaluation is somewhat limited by motion artifact. MRI CERVICAL SPINE FINDINGS Alignment: Trace anterolisthesis of C6 on C7 Vertebrae: No fracture, evidence of discitis, or bone lesion. Congenitally short pedicles, which narrow the AP diameter of the spinal canal. Cord: Normal cord signal. Multilevel cord flattening throughout the cervical spine. Posterior Fossa, vertebral arteries, paraspinal tissues: Negative. Disc levels: C2-C3: Minimal disc bulge. Facet arthropathy. Ligamentum flavum hypertrophy. Mild spinal canal stenosis. No neural foraminal narrowing. C3-C4: Mild disc bulge with central protrusion. Facet and uncovertebral hypertrophy. Ligamentum flavum hypertrophy. The disc bulge indents and deforms the ventral spinal cord, with cord flattening. Moderate to severe spinal canal stenosis. Moderate right neural foraminal narrowing. C4-C5: Disc height loss with disc bulge, which causes cord  flattening. Facet and uncovertebral hypertrophy. Ligamentum flavum hypertrophy. Severe spinal canal stenosis. Mild bilateral neural foraminal narrowing. C5-C6: Disc height loss with mild disc bulge, which causes cord flattening. Facet and uncovertebral hypertrophy. Ligamentum flavum hypertrophy. Moderate to severe spinal canal stenosis. Mild right neural foraminal narrowing. C6-C7: Disc bulge with superimposed central protrusion which indents and deforms the spinal cord. Facet and uncovertebral hypertrophy. Ligamentum flavum hypertrophy. Severe spinal canal stenosis. Moderate right neural foraminal narrowing. C7-T1: Mild disc bulge. Facet and uncovertebral hypertrophy. No spinal canal stenosis. Mild right neural foraminal narrowing. MRI THORACIC SPINE FINDINGS Alignment: Trace anterolisthesis of T2 on T3. Exaggeration of the normal thoracic kyphosis secondary to wedging midthoracic vertebral bodies S shaped curvature of the  thoracolumbar spine, with levocurvature of the mid thoracic spine. Vertebrae: No suspicious osseous lesion. Acute to subacute compression T12, with approximately 20% vertebral body height loss. Subacute compression fracture of T11 with 10% vertebral body height loss. No significant retropulsion. Wedging of T5, T6, T7, and T8, without associated edema, likely remote compression fractures. Cord:  Normal signal and morphology. Paraspinal and other soft tissues: Trace bilateral pleural effusions with associated atelectasis. Right hepatic lobe cysts, for which no follow-up is currently indicated. Disc levels: No significant spinal canal stenosis or neural foraminal narrowing. MRI LUMBAR SPINE FINDINGS Segmentation:  5 lumbar type vertebral bodies. Alignment: Mild dextrocurvature of the lumbar spine. 3 mm anterolisthesis of T12 on L1 and 6 mm anterolisthesis of L4 on L5. Vertebrae: Subacute to chronic compression fracture of L1, with approximately 50% vertebral body height loss. No significant  retropulsion. No acute fracture or suspicious osseous lesion. Conus medullaris and cauda equina: Conus extends to the L1-L2 level. Conus and cauda equina appear normal. Paraspinal and other soft tissues: Negative. Disc levels: T12-L1: Trace anterolisthesis with disc unroofing. Mild facet arthropathy. No spinal canal stenosis or neural foraminal narrowing. L1-L2: Mild disc bulge. Mild facet arthropathy. No spinal canal stenosis or neural foraminal narrowing. L2-L3: Mild disc bulge. Mild facet arthropathy. No spinal canal stenosis or neural foraminal narrowing. L3-L4: Mild disc bulge. Moderate facet arthropathy. Narrowing of the lateral recesses. No spinal canal stenosis or neural foraminal narrowing. L4-L5: Grade 1 anterolisthesis with disc unroofing. Moderate facet arthropathy. Narrowing of the lateral recesses. No spinal canal stenosis or neural foraminal narrowing. L5-S1: Mild disc bulge. Moderate facet arthropathy. No spinal canal stenosis or neural foraminal narrowing. IMPRESSION: 1. Evaluation is somewhat limited by motion artifact. Within this limitation, there is an acute to subacute compression fracture of T12, with approximately 20% vertebral body height loss, and a subacute compression fracture of T11, with approximately 10% vertebral body loss. No retropulsion or associated spinal canal stenosis. 2. Congenitally short pedicles and multilevel degenerative changes in the cervical spine, which cause severe spinal canal stenosis at C4-C5 and C6-C7 and moderate to severe spinal canal stenosis at C3-C4 and C5-C6. Multilevel cord flattening without cord signal abnormality. 3. Multilevel neural foraminal narrowing, which is moderate on the right at C3-C4 and C6-C7, mild bilaterally at C4-C5, and mild on the right at C5-C6 and C7-T1. 4. No significant spinal canal stenosis or neural foraminal narrowing in the thoracic or lumbar spine. 5. Narrowing of the lateral recesses at L3-L4 and L4-L5 could affect the  descending L4 and L5 nerve roots, respectively. 6. Trace bilateral pleural effusions with associated atelectasis. Electronically Signed   By: Wiliam Ke M.D.   On: 05/24/2022 13:02   CT CHEST ABDOMEN PELVIS W CONTRAST  Result Date: 05/23/2022 CLINICAL DATA:  Status post fall. EXAM: CT CHEST, ABDOMEN, AND PELVIS WITH CONTRAST TECHNIQUE: Multidetector CT imaging of the chest, abdomen and pelvis was performed following the standard protocol during bolus administration of intravenous contrast. RADIATION DOSE REDUCTION: This exam was performed according to the departmental dose-optimization program which includes automated exposure control, adjustment of the mA and/or kV according to patient size and/or use of iterative reconstruction technique. CONTRAST:  24mL OMNIPAQUE IOHEXOL 350 MG/ML SOLN COMPARISON:  None Available. FINDINGS: CT CHEST FINDINGS Cardiovascular: Heart size is mildly enlarged. No pericardial effusion. Aortic atherosclerosis and coronary artery calcifications. Mediastinum/Nodes: No enlarged mediastinal, hilar, or axillary lymph nodes. Thyroid gland, trachea, and esophagus demonstrate no significant findings. Lungs/Pleura: Mild centrilobular and paraseptal emphysema with diffuse bronchial wall  thickening. No pleural fluid. Subpleural consolidation is noted overlying bilateral lung bases along with areas of subsegmental atelectasis and volume loss. No suspicious pulmonary nodule or mass. Musculoskeletal: Fractures are identified involving the lateral aspect of the left forth, sixth, seventh, and eighth ribs. There is also a fracture involving the posterior aspect of the left eleventh rib. There are age indeterminate compression deformities involving the T8, T12, and L1 vertebra. Most advanced at the L1 level where there is greater than 50% loss of the vertebral body height. Schmorl's node deformity involves the superior endplate of [id number]. CT ABDOMEN PELVIS FINDINGS Hepatobiliary: No hepatic injury or  perihepatic hematoma. Cyst within posterior right lobe of liver measures 1.2 cm. Left lobe of liver cyst measures 1.1 cm. Several, subcentimeter, too small to characterize low-density liver lesions are also noted. Pancreas: Unremarkable. No pancreatic ductal dilatation or surrounding inflammatory changes. Spleen: No splenic injury or perisplenic hematoma. Adrenals/Urinary Tract: Normal adrenal glands. No nephrolithiasis, hydronephrosis or suspicious mass. Urinary bladder is unremarkable. Stomach/Bowel: Stomach is within normal limits. No evidence of bowel wall thickening, distention, or inflammatory changes. Vascular/Lymphatic: Aortic atherosclerosis. No abdominopelvic adenopathy. Reproductive: Pessary device is identified within the vaginal apex. Uterus and adnexal structures are unremarkable. Other: No free fluid or fluid collections. Musculoskeletal: Age-indeterminate fracture deformity involving the L1 vertebra is noted with loss of approximately 60% of the vertebral body height. Anterolisthesis of L4 on L5 measures 6 mm. Moderate degenerative disc disease with vacuum disc noted at L4-5. IMPRESSION: 1. Acute fractures involving the lateral aspect of the left forth, sixth, seventh, and eighth ribs. There is also a fracture involving the posterior aspect of the left eleventh rib. 2. Bilateral subpleural consolidation and atelectasis identified within both lung bases. No pleural fluid noted. 3. Age-indeterminate compression deformities involving the T8, T12, and L1 vertebra. Most severe at the L1 level where there is greater than 50% loss of the vertebral body height. 4. No signs of solid organ injury within the chest, abdomen or pelvis. 5. Aortic Atherosclerosis (ICD10-I70.0) and Emphysema (ICD10-J43.9). Electronically Signed   By: Kerby Moors M.D.   On: 05/23/2022 16:15   DG Pelvis 1-2 Views  Result Date: 05/23/2022 CLINICAL DATA:  Status post fall. EXAM: PELVIS - 1-2 VIEW COMPARISON:  None Available.  FINDINGS: The bones appear mildly demineralized. There is no evidence of acute fracture or dislocation. Mild symmetric degenerative changes at both hips and sacroiliac joints. The soft tissues appear unremarkable. IMPRESSION: No evidence of acute fracture or dislocation. Mild symmetric degenerative changes. Electronically Signed   By: Richardean Sale M.D.   On: 05/23/2022 09:41   DG Ribs Unilateral W/Chest Left  Result Date: 05/23/2022 CLINICAL DATA:  Weakness.  Fall.  Left chest pain. EXAM: LEFT RIBS AND CHEST - 3+ VIEW COMPARISON:  Chest radiographs 01/19/2013 and 05/19/2009. FINDINGS: The heart size is at the upper limits of normal. There is aortic atherosclerosis and mild vascular congestion. Small left pleural effusion and mild bibasilar atelectasis. No evidence of pneumothorax. There are at least 3 acute rib fractures on the left involving the 6th, 7th and 8th ribs anterolaterally. The 7th rib fracture is mildly displaced. Thoracolumbar spondylosis noted. Exclusion of mild thoracic compression deformities is difficult by this examination. If the patient has focal back pain, dedicated thoracic radiographs should be considered. IMPRESSION: 1. At least 3 acute left-sided rib fractures as described with small left pleural effusion. No evidence of pneumothorax. 2. Borderline heart size with vascular congestion. 3. Potential thoracic compression deformities, suboptimally assessed on  these views. Dedicated thoracic radiographs should be considered if clinically warranted. Electronically Signed   By: Richardean Sale M.D.   On: 05/23/2022 09:40   CT Head Wo Contrast  Result Date: 05/22/2022 CLINICAL DATA:  Head trauma, moderate-severe; Neck trauma (Age >= 65y) EXAM: CT HEAD WITHOUT CONTRAST CT CERVICAL SPINE WITHOUT CONTRAST TECHNIQUE: Multidetector CT imaging of the head and cervical spine was performed following the standard protocol without intravenous contrast. Multiplanar CT image reconstructions of the  cervical spine were also generated. RADIATION DOSE REDUCTION: This exam was performed according to the departmental dose-optimization program which includes automated exposure control, adjustment of the mA and/or kV according to patient size and/or use of iterative reconstruction technique. COMPARISON:  CT head 05/24/2021 FINDINGS: CT HEAD FINDINGS BRAIN: BRAIN Cerebral ventricle sizes are concordant with the degree of cerebral volume loss. Patchy and confluent areas of decreased attenuation are noted throughout the deep and periventricular white matter of the cerebral hemispheres bilaterally, compatible with chronic microvascular ischemic disease. No evidence of large-territorial acute infarction. No parenchymal hemorrhage. No mass lesion. No extra-axial collection. No mass effect or midline shift. No hydrocephalus. Basilar cisterns are patent. Vascular: No hyperdense vessel. Atherosclerotic calcifications are present within the cavernous internal carotid arteries. Skull: No acute fracture or focal lesion. Mild bilateral temporomandibular joint degenerative changes. Sinuses/Orbits: Paranasal sinuses and mastoid air cells are clear. Right lens replacement. Otherwise the orbits are unremarkable. Other: None. CT CERVICAL SPINE FINDINGS Alignment: Normal. Skull base and vertebrae: Multilevel moderate severe degenerative changes of the spine. No associated severe osseous neural foraminal or central canal stenosis. No acute fracture. No aggressive appearing focal osseous lesion or focal pathologic process. Soft tissues and spinal canal: No prevertebral fluid or swelling. No visible canal hematoma. Upper chest: Mild emphysematous changes. Other: None. IMPRESSION: 1. No acute intracranial abnormality. 2. No acute displaced fracture or traumatic listhesis of the cervical spine. 3.  Emphysema (ICD10-J43.9). Electronically Signed   By: Iven Finn M.D.   On: 05/22/2022 23:52   CT Cervical Spine Wo Contrast  Result  Date: 05/22/2022 CLINICAL DATA:  Head trauma, moderate-severe; Neck trauma (Age >= 65y) EXAM: CT HEAD WITHOUT CONTRAST CT CERVICAL SPINE WITHOUT CONTRAST TECHNIQUE: Multidetector CT imaging of the head and cervical spine was performed following the standard protocol without intravenous contrast. Multiplanar CT image reconstructions of the cervical spine were also generated. RADIATION DOSE REDUCTION: This exam was performed according to the departmental dose-optimization program which includes automated exposure control, adjustment of the mA and/or kV according to patient size and/or use of iterative reconstruction technique. COMPARISON:  CT head 05/24/2021 FINDINGS: CT HEAD FINDINGS BRAIN: BRAIN Cerebral ventricle sizes are concordant with the degree of cerebral volume loss. Patchy and confluent areas of decreased attenuation are noted throughout the deep and periventricular white matter of the cerebral hemispheres bilaterally, compatible with chronic microvascular ischemic disease. No evidence of large-territorial acute infarction. No parenchymal hemorrhage. No mass lesion. No extra-axial collection. No mass effect or midline shift. No hydrocephalus. Basilar cisterns are patent. Vascular: No hyperdense vessel. Atherosclerotic calcifications are present within the cavernous internal carotid arteries. Skull: No acute fracture or focal lesion. Mild bilateral temporomandibular joint degenerative changes. Sinuses/Orbits: Paranasal sinuses and mastoid air cells are clear. Right lens replacement. Otherwise the orbits are unremarkable. Other: None. CT CERVICAL SPINE FINDINGS Alignment: Normal. Skull base and vertebrae: Multilevel moderate severe degenerative changes of the spine. No associated severe osseous neural foraminal or central canal stenosis. No acute fracture. No aggressive appearing focal osseous lesion or  focal pathologic process. Soft tissues and spinal canal: No prevertebral fluid or swelling. No visible  canal hematoma. Upper chest: Mild emphysematous changes. Other: None. IMPRESSION: 1. No acute intracranial abnormality. 2. No acute displaced fracture or traumatic listhesis of the cervical spine. 3.  Emphysema (ICD10-J43.9). Electronically Signed   By: Iven Finn M.D.   On: 05/22/2022 23:52    Review of Systems  Constitutional:  Positive for fatigue.  HENT: Negative.    Eyes: Negative.   Respiratory: Negative.    Cardiovascular:  Positive for chest pain.  Gastrointestinal: Negative.   Genitourinary:  Positive for flank pain.  Musculoskeletal:  Positive for gait problem, neck pain and neck stiffness.  Skin: Negative.   Neurological:  Positive for weakness.  Hematological: Negative.   Psychiatric/Behavioral: Negative.     Blood pressure (!) 162/79, pulse 90, temperature 98.9 F (37.2 C), temperature source Oral, resp. rate 18, height 5\' 8"  (1.727 m), weight 90.7 kg, SpO2 (!) 87 %. Physical Exam Constitutional:      General: She is not in acute distress.    Appearance: She is normal weight. She is not ill-appearing or toxic-appearing.  HENT:     Head: Normocephalic and atraumatic.     Right Ear: External ear normal.     Left Ear: External ear normal.     Nose: Nose normal. No congestion.     Mouth/Throat:     Mouth: Mucous membranes are moist.     Pharynx: Oropharynx is clear.  Eyes:     Extraocular Movements: Extraocular movements intact.     Pupils: Pupils are equal, round, and reactive to light.  Neck:     Comments: Neck held tilted to the right side Cardiovascular:     Rate and Rhythm: Normal rate and regular rhythm.  Abdominal:     General: Abdomen is flat.  Musculoskeletal:     Cervical back: Rigidity present.  Skin:    General: Skin is warm and dry.  Neurological:     Mental Status: She is alert and oriented to person, place, and time. Mental status is at baseline.  Psychiatric:        Mood and Affect: Mood normal.        Behavior: Behavior normal.         Thought Content: Thought content normal.        Judgment: Judgment normal.     Assessment/Plan: [name] is a 81 y.o. female With cervical stenosis. Do not believe she is a good candidate for operative treatment, anterior decompression and arthrodesis of cervical spine truly is not feasible. The other findings on the MRI are non contributory.  I have spoken with Dr. Rolena Infante and he agrees. She is his current patient.   Ashok Pall 05/24/2022, 8:35 PM

## 2022-05-24 NOTE — TOC Initial Note (Signed)
Transition of Care [facility]) - Initial/Assessment Note    Patient Details  Name: [name] MRN: [id number] Date of Birth: 02-17-1941  Transition of Care [facility]) CM/SW Contact:    Curlene Labrum, RN Phone Number: 05/24/2022, 3:57 PM  Clinical Narrative:                 CM met with the patient at the bedside to discuss transitions of care needs.  The patient was admitted for back pain, neck pain and weakness of lower extremities.  The patient lives with her husband at home.  The patient uses a cane for ambulation and has a RW at home as well.  The patient states that she has been sleeping downstairs in a lift chair since her bedroom is upstairs.  Medicare observation letter provided to the patient.  PT/OT have been consulted at this time and is pending evaluation at this time.  CM will continue to follow the patient for TOC needs - TBD at this time.  Expected Discharge Plan: [place] Barriers to Discharge: Continued Medical Work up   Patient Goals and CMS Choice Patient states their goals for this hospitalization and ongoing recovery are:: To get better and go home CMS Medicare.gov Compare Post Acute Care list provided to:: Patient Choice offered to / list presented to : Patient  Expected Discharge Plan and Services Expected Discharge Plan: [place]   Discharge Planning Services: CM Consult   Living arrangements for the past 2 months: Single Family Home                                      Prior Living Arrangements/Services Living arrangements for the past 2 months: Single Family Home Lives with:: Spouse Patient language and need for interpreter reviewed:: Yes Do you feel safe going back to the place where you live?: Yes      Need for Family Participation in Patient Care: Yes (Comment) Care giver support system in place?: Yes (comment) Current home services: DME (Cane, RW at home - sleeps in left chair on main level  of home) Criminal Activity/Legal Involvement Pertinent to Current Situation/Hospitalization: No - Comment as needed  Activities of Daily Living Home Assistive Devices/Equipment: None ADL Screening (condition at time of admission) Patient's cognitive ability adequate to safely complete daily activities?: Yes Is the patient deaf or have difficulty hearing?: No Does the patient have difficulty seeing, even when wearing glasses/contacts?: No Does the patient have difficulty concentrating, remembering, or making decisions?: No Patient able to express need for assistance with ADLs?: No Does the patient have difficulty dressing or bathing?: No Independently performs ADLs?: No Toileting: Needs assistance Is this a change from baseline?: Change from baseline, expected to last <3 days In/Out Bed: Needs assistance Does the patient have difficulty walking or climbing stairs?: Yes Weakness of Legs: Both Weakness of Arms/Hands: None  Permission Sought/Granted Permission sought to share information with : Case Manager, Family Supports Permission granted to share information with : Yes, Verbal Permission Granted        Permission granted to share info w Relationship: spouse Marcello Moores - [phone]     Emotional Assessment Appearance:: Well-Groomed Attitude/Demeanor/Rapport: Gracious Affect (typically observed): Accepting Orientation: : Oriented to Self, Oriented to Place, Oriented to  Time, Oriented to Situation Alcohol / Substance Use: Not Applicable Psych Involvement: No (comment)  Admission diagnosis:  Recurrent falls [  R29.6] Closed fracture of multiple ribs of left side, initial encounter [S22.42XA] Patient Active Problem List   Diagnosis Date Noted   Recurrent falls 05/23/2022   Class 1 obesity due to excess calories with body mass index (BMI) of 30.0 to 30.9 in adult 05/23/2022   Advanced nonexudative age-related macular degeneration of right eye with subfoveal involvement 05/04/2021    Early stage nonexudative age-related macular degeneration of left eye 12/01/2020   Nuclear sclerotic cataract of left eye 02/25/2020   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   Exudative age-related macular degeneration of right eye with inactive choroidal neovascularization (HCC) 09/24/2019   Retinal hemorrhage of right eye 09/24/2019   Retinal exudates and deposits 09/24/2019   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   Mastodynia, female 03/19/2016   Cystocele 02/08/2014   Vaginal atrophy 01/28/2014   History of herpes simplex infection 01/28/2014   Knee pain, acute 03/05/2013   Anxiety and depression 02/05/2013   Constipation 02/05/2013   Psoriasis 02/05/2013   Failed total knee arthroplasty (HCC) 01/23/2013   Sciatic pain    Detrusor instability    Uterine prolapse    Atrophic vaginitis    Osteoporosis    PCP:  Raju, Sneha P, MD Pharmacy:   [id number] - [place], [place] - [address] [place] [address] Phone: [phone] Fax: [phone]     Social Determinants of Health (SDOH) Interventions    Readmission Risk Interventions     No data to display           

## 2022-05-24 NOTE — Progress Notes (Signed)
Initial Nutrition Assessment  DOCUMENTATION CODES:  Not applicable  INTERVENTION:  Continue regular diet, encourage PO intake Adjust to ordering assist MVI with minerals daily Ensure Enlive po BID, each supplement provides 350 kcal and 20 grams of protein.  NUTRITION DIAGNOSIS:  Inadequate oral intake related to social / environmental circumstances (skipping meals) as evidenced by per patient/family report.  GOAL:   Patient will meet greater than or equal to 90% of their needs  MONITOR:   PO intake  REASON FOR ASSESSMENT:  Consult  (nutritional goals)  ASSESSMENT:  Pt with hx of osteoporosis and GERD presented to ED after a fall at home. Reports weakness and falls have been an ongoing issue for several years but it has acutely worsened. Imaging in ED show left sided rib fractures  Spoke with pt on room phone. Reports a good appetite at baseline, eats 2x/d. Oatmeal ~11AM and then will cook dinner (meat with two vegetables). Discussed with pt the importance of adequte intake in preserving lean muscle mass and advised that she may not be getting adequate energy as her portions sound small. Discussed adding a small meal in the morning closer to when she wakes up and adding a source of protein with her oatmeal. Pt also agreeable to trying ensure shakes while at the hospital to determine if this may be an option as a bedtime snack or first thing in the morning at home. Prefers vanilla.    Nutritionally Relevant Medications: Scheduled Meds:  docusate sodium  100 mg Oral BID   PRN Meds: bisacodyl, ondansetron, polyethylene glycol  Labs Reviewed  NUTRITION - FOCUSED PHYSICAL EXAM: Defer to in-person assessment  Diet Order:   Diet Order             Diet regular Room service appropriate? Yes; Fluid consistency: Thin  Diet effective now                   EDUCATION NEEDS:  Education needs have been addressed  Skin:  Skin Assessment: Reviewed RN Assessment  Last BM:   12/6  Height:  Ht Readings from Last 1 Encounters:  05/22/22 5\' 8"  (1.727 m)    Weight:  Wt Readings from Last 1 Encounters:  05/22/22 90.7 kg    Ideal Body Weight:  63.6 kg  BMI:  Body mass index is 30.41 kg/m.  Estimated Nutritional Needs:  Kcal:  1600-1800 kcal/d Protein:  75-90g/d Fluid:  >/=1.8L/d    14/05/23, RD, LDN Clinical Dietitian RD pager # available in [facility]  After hours/weekend pager # available in [facility]

## 2022-05-25 ENCOUNTER — Observation Stay (HOSPITAL_COMMUNITY): Payer: Medicare PPO

## 2022-05-25 DIAGNOSIS — K219 Gastro-esophageal reflux disease without esophagitis: Secondary | ICD-10-CM | POA: Diagnosis present

## 2022-05-25 DIAGNOSIS — Z96653 Presence of artificial knee joint, bilateral: Secondary | ICD-10-CM | POA: Diagnosis present

## 2022-05-25 DIAGNOSIS — M4854XA Collapsed vertebra, not elsewhere classified, thoracic region, initial encounter for fracture: Secondary | ICD-10-CM | POA: Diagnosis present

## 2022-05-25 DIAGNOSIS — Z803 Family history of malignant neoplasm of breast: Secondary | ICD-10-CM | POA: Diagnosis not present

## 2022-05-25 DIAGNOSIS — F419 Anxiety disorder, unspecified: Secondary | ICD-10-CM | POA: Diagnosis present

## 2022-05-25 DIAGNOSIS — J9811 Atelectasis: Secondary | ICD-10-CM | POA: Diagnosis not present

## 2022-05-25 DIAGNOSIS — W19XXXA Unspecified fall, initial encounter: Secondary | ICD-10-CM | POA: Diagnosis present

## 2022-05-25 DIAGNOSIS — E538 Deficiency of other specified B group vitamins: Secondary | ICD-10-CM | POA: Diagnosis present

## 2022-05-25 DIAGNOSIS — M549 Dorsalgia, unspecified: Secondary | ICD-10-CM | POA: Diagnosis present

## 2022-05-25 DIAGNOSIS — E669 Obesity, unspecified: Secondary | ICD-10-CM | POA: Diagnosis present

## 2022-05-25 DIAGNOSIS — Z6830 Body mass index (BMI) 30.0-30.9, adult: Secondary | ICD-10-CM | POA: Diagnosis not present

## 2022-05-25 DIAGNOSIS — Z87891 Personal history of nicotine dependence: Secondary | ICD-10-CM | POA: Diagnosis not present

## 2022-05-25 DIAGNOSIS — M810 Age-related osteoporosis without current pathological fracture: Secondary | ICD-10-CM | POA: Diagnosis present

## 2022-05-25 DIAGNOSIS — M542 Cervicalgia: Secondary | ICD-10-CM | POA: Diagnosis not present

## 2022-05-25 DIAGNOSIS — F32A Depression, unspecified: Secondary | ICD-10-CM | POA: Diagnosis present

## 2022-05-25 DIAGNOSIS — Z9181 History of falling: Secondary | ICD-10-CM | POA: Diagnosis not present

## 2022-05-25 DIAGNOSIS — Z79899 Other long term (current) drug therapy: Secondary | ICD-10-CM | POA: Diagnosis not present

## 2022-05-25 DIAGNOSIS — H353212 Exudative age-related macular degeneration, right eye, with inactive choroidal neovascularization: Secondary | ICD-10-CM | POA: Diagnosis present

## 2022-05-25 DIAGNOSIS — R296 Repeated falls: Secondary | ICD-10-CM | POA: Diagnosis present

## 2022-05-25 DIAGNOSIS — G8929 Other chronic pain: Secondary | ICD-10-CM | POA: Diagnosis present

## 2022-05-25 DIAGNOSIS — R0781 Pleurodynia: Secondary | ICD-10-CM | POA: Diagnosis present

## 2022-05-25 DIAGNOSIS — M4802 Spinal stenosis, cervical region: Secondary | ICD-10-CM | POA: Diagnosis present

## 2022-05-25 DIAGNOSIS — S2242XA Multiple fractures of ribs, left side, initial encounter for closed fracture: Secondary | ICD-10-CM | POA: Diagnosis present

## 2022-05-25 DIAGNOSIS — L409 Psoriasis, unspecified: Secondary | ICD-10-CM | POA: Diagnosis present

## 2022-05-25 DIAGNOSIS — M545 Low back pain, unspecified: Secondary | ICD-10-CM | POA: Diagnosis not present

## 2022-05-25 LAB — VITAMIN B12: Vitamin B-12: 265 pg/mL (ref 180–914)

## 2022-05-25 LAB — TSH: TSH: 0.632 u[IU]/mL (ref 0.350–4.500)

## 2022-05-25 LAB — FOLATE: Folate: 8.8 ng/mL (ref >5.9)

## 2022-05-25 MED ORDER — CYANOCOBALAMIN 1000 MCG/ML IJ SOLN
1000.0000 ug | Freq: Every day | INTRAMUSCULAR | Status: DC
  Administered 2022-05-25 – 2022-05-28 (×4): 1000 ug via INTRAMUSCULAR
  Filled 2022-05-25 (×5): qty 1

## 2022-05-25 MED ORDER — VITAMIN B-12 1000 MCG PO TABS: 1000.0000 ug | ORAL_TABLET | Freq: Every day | ORAL | Status: DC

## 2022-05-25 NOTE — Progress Notes (Signed)
OT Cancellation Note  Patient Details Name: [name] MRN: [id number] DOB: 1941/02/14   Cancelled Treatment:    Reason Eval/Treat Not Completed: Other (comment). Pt just finished with PT , x-ray present, asking for OT to return in an hour. Will follow up.   [name],HILLARY 05/25/2022, 10:13 AM Luisa Dago, OT/L   Acute OT Clinical Specialist Acute Rehabilitation Services Pager [phone] Office [phone]

## 2022-05-25 NOTE — TOC Progression Note (Signed)
Transition of Care [facility]) - Progression Note    Patient Details  Name: [name] MRN: [id number] Date of Birth: 07-29-1940  Transition of Care [facility]) CM/SW [place], RN Phone Number: 05/25/2022, 2:19 PM  Clinical Narrative:    Cm met with the patient at the bedside to discuss SNF placement and the patient was agreeable for SNF workup for short term placement for physical rehabilitation services.  Fl2 to be completed and patient to be faxed out in the hub.  CM/ MSW to provide bed offers to patient and start insurance authorization once patient has selected a SNF facility.   Expected Discharge Plan: [place] Barriers to Discharge: Continued Medical Work up  Expected Discharge Plan and Services Expected Discharge Plan: [place]   Discharge Planning Services: CM Consult   Living arrangements for the past 2 months: Single Family Home                                       Social Determinants of Health (SDOH) Interventions    Readmission Risk Interventions     No data to display

## 2022-05-25 NOTE — NC FL2 (Signed)
[place] MEDICAID FL2 LEVEL OF CARE FORM     IDENTIFICATION  Patient Name: [name] Birthdate: 1941-06-16 Sex: female Admission Date (Current Location): 05/22/2022  [facility] and [place] Number:  Producer, television/film/video and Address:  [facility] [place], [address], [place], [place] [address]      Provider Number: [id number]  Attending Physician Name and Address:  Osvaldo Shipper, MD  Relative Name and Phone Number:  Denali Sharma, spouse - [phone]    Current Level of Care: Hospital Recommended Level of Care: Skilled Nursing Facility Prior Approval Number:    Date Approved/Denied:   PASRR Number: [id number]  Discharge Plan: SNF    Current Diagnoses: Patient Active Problem List   Diagnosis Date Noted   Recurrent falls 05/23/2022   Class 1 obesity due to excess calories with body mass index (BMI) of 30.0 to 30.9 in adult 05/23/2022   Advanced nonexudative age-related macular degeneration of right eye with subfoveal involvement 05/04/2021   Early stage nonexudative age-related macular degeneration of left eye 12/01/2020   Nuclear sclerotic cataract of left eye 02/25/2020   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   Exudative age-related macular degeneration of right eye with inactive choroidal neovascularization (HCC) 09/24/2019   Retinal hemorrhage of right eye 09/24/2019   Retinal exudates and deposits 09/24/2019   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   Mastodynia, female 03/19/2016   Cystocele 02/08/2014   Vaginal atrophy 01/28/2014   History of herpes simplex infection 01/28/2014   Knee pain, acute 03/05/2013   Anxiety and depression 02/05/2013   Constipation 02/05/2013   Psoriasis 02/05/2013   Failed total knee arthroplasty (HCC) 01/23/2013   Sciatic pain    Detrusor instability    Uterine prolapse    Atrophic vaginitis    Osteoporosis     Orientation RESPIRATION BLADDER Height & Weight      Self, Time, Situation, Place  Normal Incontinent Weight: 90.7 kg Height:  5\' 8"  (172.7 cm)  BEHAVIORAL SYMPTOMS/MOOD NEUROLOGICAL BOWEL NUTRITION STATUS      Continent Diet (See discharge summary)  AMBULATORY STATUS COMMUNICATION OF NEEDS Skin   Total Care Verbally Normal                       Personal Care Assistance Level of Assistance  Bathing, Feeding, Dressing Bathing Assistance: Limited assistance Feeding assistance: Independent Dressing Assistance: Limited assistance     Functional Limitations Info  Sight, Hearing, Speech Sight Info: Impaired Hearing Info: Adequate Speech Info: Adequate    SPECIAL CARE FACTORS FREQUENCY  PT (By licensed PT), OT (By licensed OT)     PT Frequency: 3-5 x per week OT Frequency: 3-5 x per week            Contractures Contractures Info: Not present    Additional Factors Info  Code Status, Allergies, Psychotropic Code Status Info: Full code Allergies Info: NKDA Psychotropic Info: Prozac, Ativan         Current Medications (05/25/2022):  This is the current hospital active medication list Current Facility-Administered Medications  Medication Dose Route Frequency Provider Last Rate Last Admin   acetaminophen (TYLENOL) tablet 650 mg  650 mg Oral Q6H PRN 14/01/2022, MD       Or   acetaminophen (TYLENOL) suppository 650 mg  650 mg Rectal Q6H PRN Jonah Blue, MD       bisacodyl (DULCOLAX) EC tablet 5 mg  5 mg Oral Daily PRN Jonah Blue, MD  cyanocobalamin (VITAMIN B12) injection 1,000 mcg  1,000 mcg Intramuscular Daily Osvaldo Shipper, MD   1,000 mcg at 05/25/22 1352   Followed by   Melene Muller ON 05/30/2022] cyanocobalamin (VITAMIN B12) tablet 1,000 mcg  1,000 mcg Oral Daily Osvaldo Shipper, MD       docusate sodium (COLACE) capsule 100 mg  100 mg Oral BID Jonah Blue, MD   100 mg at 05/25/22 0958   enoxaparin (LOVENOX) injection 40 mg  40 mg Subcutaneous Daily Jonah Blue, MD   40 mg at 05/25/22 0958    feeding supplement (ENSURE ENLIVE / ENSURE PLUS) liquid 237 mL  237 mL Oral BID BM Osvaldo Shipper, MD   237 mL at 05/25/22 0958   FLUoxetine (PROZAC) capsule 60 mg  60 mg Oral Daily Jonah Blue, MD   60 mg at 05/25/22 0957   hydrALAZINE (APRESOLINE) injection 5 mg  5 mg Intravenous Q4H PRN Jonah Blue, MD       lidocaine (LIDODERM) 5 % 1 patch  1 patch Transdermal Q24H Jonah Blue, MD   1 patch at 05/24/22 1624   LORazepam (ATIVAN) tablet 0.5 mg  0.5 mg Oral Daily PRN Jonah Blue, MD       methocarbamol (ROBAXIN) 500 mg in dextrose 5 % 50 mL IVPB  500 mg Intravenous Q6H PRN Osvaldo Shipper, MD       methocarbamol (ROBAXIN) tablet 500 mg  500 mg Oral TID Osvaldo Shipper, MD   500 mg at 05/25/22 0957   morphine (PF) 2 MG/ML injection 2 mg  2 mg Intravenous Q2H PRN Jonah Blue, MD   2 mg at 05/23/22 1844   multivitamin with minerals tablet 1 tablet  1 tablet Oral Daily Osvaldo Shipper, MD   1 tablet at 05/25/22 0957   ondansetron (ZOFRAN) tablet 4 mg  4 mg Oral Q6H PRN Jonah Blue, MD       Or   ondansetron [facility]) injection 4 mg  4 mg Intravenous Q6H PRN Jonah Blue, MD       oxyCODONE (Oxy IR/ROXICODONE) immediate release tablet 5 mg  5 mg Oral Q4H PRN Jonah Blue, MD   5 mg at 05/25/22 1136   polyethylene glycol (MIRALAX / GLYCOLAX) packet 17 g  17 g Oral Daily PRN Jonah Blue, MD       valACYclovir Ralph Dowdy) tablet 500 mg  500 mg Oral Daily Jonah Blue, MD   500 mg at 05/25/22 3212     Discharge Medications: Please see discharge summary for a list of discharge medications.  Relevant Imaging Results:  Relevant Lab Results:   Additional Information SS# [id number]  Janae Bridgeman, RN

## 2022-05-25 NOTE — Progress Notes (Signed)
[facility] PROGRESS NOTE   [name] EXN:[id number] DOB: December 13, 1940 DOA: 05/22/2022  PCP: Thana Ates, MD  Brief History/Interval Summary: 81 y.o. female with medical history significant of anxiety and neck pain presenting with a fall.   She reports that in 2021 she had an Achilles rupture; she went to podiatry Karren Burly, DPM) and he reportedly told her that she was too chronically ill for surgical repair.  Instead, he sent her to PT and had her wear a boot and it eventually healed.  However, in the intervening 2 years she has become increasingly weak and needs assistive devices to get around even her house (mostly uses a cane for convenience).  Meanwhile, her neck is also weak and she has been to see Dr. Shon Baton who reportedly told her she has degenerative disease of the cervical and lumbar spine and referred her to pain management for injection because he agreed that she is not a surgical candidate.  She has had a number of recent falls from her legs giving out.  This happened the night prior to admission and she had significant left-sided rib pain as a result of fractures.   Consultants: Dr. Franky Macho with neurosurgery  Procedures: None yet    Subjective/Interval History: Patient denies any worsening of her neck pain and back pain.  She understands that there is no surgical option for the multiple abnormalities noted on the imaging studies.  No other complaints offered.  Looking forward to physical and Occupational Therapy today.    Assessment/Plan:  Back pain/neck pain/multiple compression fractures Patient with chronic history of neck and back pain but worse over the past few days due to recurrent falls.  She also mention lower extremity weakness. CT cervical spine did not show any acute fractures.  She had a CT scan of the chest abdomen and pelvis which showed compression fractures of T8, T12 and L4 1 although this was age-indeterminate. Patient does have  weakness in bilateral lower extremities.  Her orthopedic doctors office was called, Dr. Shon Baton.  No recent MRIs have been done on this patient in their office. Based on care everywhere looks like she has had an MRI Lumbar spine in May of this year which showed mild progression of multilevel degenerative disease.  There was no mention of any compression fractures at that time. Patient subsequently underwent MRI of the cervical thoracic and lumbar spine.  Multiple disc disease is noted.  Multiple compression fractures though most of them were thought to be subacute to chronic.  Spinal stenosis noted but no significant cord involvement. Patient seen by neurosurgery.  No role for surgical intervention at this time.  Wait on PT and OT evaluation.  May need rehab. Pain is reasonably well-controlled.  Rib fracture Patient noted to have fractures involving the left side.  No pneumothorax was noted. Incentive spirometry.  Pain control.  Will repeat chest x-ray.  Recurrent falls See above for details.  PT and OT evaluation.   Vitamin B12 deficiency Vitamin B12 level noted to be low at 265.  Folic acid level is 8.8.  Start vitamin B12 supplementation.  Will start her with injections for now.  Mood disorder Continue home medications.  Macular degeneration Stable.  Obesity Estimated body mass index is 30.41 kg/m as calculated from the following:   Height as of this encounter: 5\' 8"  (1.727 m).   Weight as of this encounter: 90.7 kg.  DVT Prophylaxis: Lovenox Code Status: Full code Family Communication: Discussed with patient Disposition Plan:  To be determined  Status is: Observation The patient will require care spanning > 2 midnights and should be moved to inpatient because: Persistent back pain limiting mobility, high fall risk.      Medications: Scheduled:  docusate sodium  100 mg Oral BID   enoxaparin (LOVENOX) injection  40 mg Subcutaneous Daily   feeding supplement  237 mL Oral BID  BM   FLUoxetine  60 mg Oral Daily   lidocaine  1 patch Transdermal Q24H   methocarbamol  500 mg Oral TID   multivitamin with minerals  1 tablet Oral Daily   valACYclovir  500 mg Oral Daily   Continuous:  methocarbamol (ROBAXIN) IV     [id number] **OR** acetaminophen, bisacodyl, hydrALAZINE, LORazepam, methocarbamol (ROBAXIN) IV, morphine injection, ondansetron **OR** ondansetron (ZOFRAN) IV, oxyCODONE, polyethylene glycol  Antibiotics: Anti-infectives (From admission, onward)    Start     Dose/Rate Route Frequency Ordered Stop   05/24/22 1000  valACYclovir (VALTREX) tablet 500 mg        500 mg Oral Daily 05/23/22 1700         Objective:  Vital Signs  Vitals:   05/24/22 1559 05/24/22 2025 05/25/22 0242 05/25/22 0836  BP: (!) 158/74 (!) 162/79 (!) 144/66 (!) 112/90  Pulse: 92 90 87 98  Resp: 18 18 16 16   Temp: 99 F (37.2 C) 98.9 F (37.2 C) 99 F (37.2 C) 98.8 F (37.1 C)  TempSrc: Oral Oral  Oral  SpO2: (!) 87% (!) 87% 97% 91%  Weight:      Height:        Intake/Output Summary (Last 24 hours) at 05/25/2022 0942 Last data filed at 05/25/2022 0024 Gross per 24 hour  Intake --  Output 500 ml  Net -500 ml   Filed Weights   05/22/22 2203  Weight: 90.7 kg    General appearance: Awake alert.  In no distress Resp: Clear to auscultation bilaterally.  Normal effort Cardio: S1-S2 is normal regular.  No S3-S4.  No rubs murmurs or bruit GI: Abdomen is soft.  Nontender nondistended.  Bowel sounds are present normal.  No masses organomegaly Extremities: No edema.  Significant physical deconditioning noted.  Barely able to lift her legs off the bed. Neurologic: Alert and oriented x3.  No obvious focal neurological deficits except for the weakness appreciated in the lower extremities presumably from her spinal issues or significant physical deconditioning.  Lab Results:  Data Reviewed: I have personally reviewed following labs and reports of the imaging  studies  CBC: Recent Labs  Lab 05/23/22 1137 05/23/22 1144 05/24/22 0455  WBC 7.9  --  7.0  NEUTROABS 5.4  --   --   HGB 14.3 15.3* 13.0  HCT 45.2 45.0 40.6  MCV 89.3  --  89.0  PLT 187  --  164     Basic Metabolic Panel: Recent Labs  Lab 05/23/22 1137 05/23/22 1144 05/24/22 0455  NA 141 141 138  K 3.6 3.7 3.8  CL 103 103 104  CO2 29  --  26  GLUCOSE 89 89 114*  BUN 12 14 16   CREATININE 0.61 0.50 0.62  CALCIUM 9.6  --  9.0     GFR: Estimated Creatinine Clearance: 63.9 mL/min (by C-G formula based on SCr of 0.62 mg/dL).   Radiology Studies: MR CERVICAL SPINE WO CONTRAST  Result Date: 05/24/2022 CLINICAL DATA:  Myelopathy EXAM: MRI CERVICAL, THORACIC AND LUMBAR SPINE WITHOUT CONTRAST TECHNIQUE: Multiplanar and multiecho pulse sequences of the cervical spine, to  include the craniocervical junction and cervicothoracic junction, and thoracic and lumbar spine, were obtained without intravenous contrast. COMPARISON:  MRI lumbar spine 10/27/2011, CT cervical spine 05/24/2022, correlation is also made with CT chest abdomen pelvis 05/23/2022 FINDINGS: Evaluation is somewhat limited by motion artifact. MRI CERVICAL SPINE FINDINGS Alignment: Trace anterolisthesis of C6 on C7 Vertebrae: No fracture, evidence of discitis, or bone lesion. Congenitally short pedicles, which narrow the AP diameter of the spinal canal. Cord: Normal cord signal. Multilevel cord flattening throughout the cervical spine. Posterior Fossa, vertebral arteries, paraspinal tissues: Negative. Disc levels: C2-C3: Minimal disc bulge. Facet arthropathy. Ligamentum flavum hypertrophy. Mild spinal canal stenosis. No neural foraminal narrowing. C3-C4: Mild disc bulge with central protrusion. Facet and uncovertebral hypertrophy. Ligamentum flavum hypertrophy. The disc bulge indents and deforms the ventral spinal cord, with cord flattening. Moderate to severe spinal canal stenosis. Moderate right neural foraminal narrowing.  C4-C5: Disc height loss with disc bulge, which causes cord flattening. Facet and uncovertebral hypertrophy. Ligamentum flavum hypertrophy. Severe spinal canal stenosis. Mild bilateral neural foraminal narrowing. C5-C6: Disc height loss with mild disc bulge, which causes cord flattening. Facet and uncovertebral hypertrophy. Ligamentum flavum hypertrophy. Moderate to severe spinal canal stenosis. Mild right neural foraminal narrowing. C6-C7: Disc bulge with superimposed central protrusion which indents and deforms the spinal cord. Facet and uncovertebral hypertrophy. Ligamentum flavum hypertrophy. Severe spinal canal stenosis. Moderate right neural foraminal narrowing. C7-T1: Mild disc bulge. Facet and uncovertebral hypertrophy. No spinal canal stenosis. Mild right neural foraminal narrowing. MRI THORACIC SPINE FINDINGS Alignment: Trace anterolisthesis of T2 on T3. Exaggeration of the normal thoracic kyphosis secondary to wedging midthoracic vertebral bodies S shaped curvature of the thoracolumbar spine, with levocurvature of the mid thoracic spine. Vertebrae: No suspicious osseous lesion. Acute to subacute compression T12, with approximately 20% vertebral body height loss. Subacute compression fracture of T11 with 10% vertebral body height loss. No significant retropulsion. Wedging of T5, T6, T7, and T8, without associated edema, likely remote compression fractures. Cord:  Normal signal and morphology. Paraspinal and other soft tissues: Trace bilateral pleural effusions with associated atelectasis. Right hepatic lobe cysts, for which no follow-up is currently indicated. Disc levels: No significant spinal canal stenosis or neural foraminal narrowing. MRI LUMBAR SPINE FINDINGS Segmentation:  5 lumbar type vertebral bodies. Alignment: Mild dextrocurvature of the lumbar spine. 3 mm anterolisthesis of T12 on L1 and 6 mm anterolisthesis of L4 on L5. Vertebrae: Subacute to chronic compression fracture of L1, with  approximately 50% vertebral body height loss. No significant retropulsion. No acute fracture or suspicious osseous lesion. Conus medullaris and cauda equina: Conus extends to the L1-L2 level. Conus and cauda equina appear normal. Paraspinal and other soft tissues: Negative. Disc levels: T12-L1: Trace anterolisthesis with disc unroofing. Mild facet arthropathy. No spinal canal stenosis or neural foraminal narrowing. L1-L2: Mild disc bulge. Mild facet arthropathy. No spinal canal stenosis or neural foraminal narrowing. L2-L3: Mild disc bulge. Mild facet arthropathy. No spinal canal stenosis or neural foraminal narrowing. L3-L4: Mild disc bulge. Moderate facet arthropathy. Narrowing of the lateral recesses. No spinal canal stenosis or neural foraminal narrowing. L4-L5: Grade 1 anterolisthesis with disc unroofing. Moderate facet arthropathy. Narrowing of the lateral recesses. No spinal canal stenosis or neural foraminal narrowing. L5-S1: Mild disc bulge. Moderate facet arthropathy. No spinal canal stenosis or neural foraminal narrowing. IMPRESSION: 1. Evaluation is somewhat limited by motion artifact. Within this limitation, there is an acute to subacute compression fracture of T12, with approximately 20% vertebral body height loss, and a subacute  compression fracture of T11, with approximately 10% vertebral body loss. No retropulsion or associated spinal canal stenosis. 2. Congenitally short pedicles and multilevel degenerative changes in the cervical spine, which cause severe spinal canal stenosis at C4-C5 and C6-C7 and moderate to severe spinal canal stenosis at C3-C4 and C5-C6. Multilevel cord flattening without cord signal abnormality. 3. Multilevel neural foraminal narrowing, which is moderate on the right at C3-C4 and C6-C7, mild bilaterally at C4-C5, and mild on the right at C5-C6 and C7-T1. 4. No significant spinal canal stenosis or neural foraminal narrowing in the thoracic or lumbar spine. 5. Narrowing of the  lateral recesses at L3-L4 and L4-L5 could affect the descending L4 and L5 nerve roots, respectively. 6. Trace bilateral pleural effusions with associated atelectasis. Electronically Signed   By: Wiliam Ke M.D.   On: 05/24/2022 13:02   MR THORACIC SPINE WO CONTRAST  Result Date: 05/24/2022 CLINICAL DATA:  Myelopathy EXAM: MRI CERVICAL, THORACIC AND LUMBAR SPINE WITHOUT CONTRAST TECHNIQUE: Multiplanar and multiecho pulse sequences of the cervical spine, to include the craniocervical junction and cervicothoracic junction, and thoracic and lumbar spine, were obtained without intravenous contrast. COMPARISON:  MRI lumbar spine 10/27/2011, CT cervical spine 05/24/2022, correlation is also made with CT chest abdomen pelvis 05/23/2022 FINDINGS: Evaluation is somewhat limited by motion artifact. MRI CERVICAL SPINE FINDINGS Alignment: Trace anterolisthesis of C6 on C7 Vertebrae: No fracture, evidence of discitis, or bone lesion. Congenitally short pedicles, which narrow the AP diameter of the spinal canal. Cord: Normal cord signal. Multilevel cord flattening throughout the cervical spine. Posterior Fossa, vertebral arteries, paraspinal tissues: Negative. Disc levels: C2-C3: Minimal disc bulge. Facet arthropathy. Ligamentum flavum hypertrophy. Mild spinal canal stenosis. No neural foraminal narrowing. C3-C4: Mild disc bulge with central protrusion. Facet and uncovertebral hypertrophy. Ligamentum flavum hypertrophy. The disc bulge indents and deforms the ventral spinal cord, with cord flattening. Moderate to severe spinal canal stenosis. Moderate right neural foraminal narrowing. C4-C5: Disc height loss with disc bulge, which causes cord flattening. Facet and uncovertebral hypertrophy. Ligamentum flavum hypertrophy. Severe spinal canal stenosis. Mild bilateral neural foraminal narrowing. C5-C6: Disc height loss with mild disc bulge, which causes cord flattening. Facet and uncovertebral hypertrophy. Ligamentum flavum  hypertrophy. Moderate to severe spinal canal stenosis. Mild right neural foraminal narrowing. C6-C7: Disc bulge with superimposed central protrusion which indents and deforms the spinal cord. Facet and uncovertebral hypertrophy. Ligamentum flavum hypertrophy. Severe spinal canal stenosis. Moderate right neural foraminal narrowing. C7-T1: Mild disc bulge. Facet and uncovertebral hypertrophy. No spinal canal stenosis. Mild right neural foraminal narrowing. MRI THORACIC SPINE FINDINGS Alignment: Trace anterolisthesis of T2 on T3. Exaggeration of the normal thoracic kyphosis secondary to wedging midthoracic vertebral bodies S shaped curvature of the thoracolumbar spine, with levocurvature of the mid thoracic spine. Vertebrae: No suspicious osseous lesion. Acute to subacute compression T12, with approximately 20% vertebral body height loss. Subacute compression fracture of T11 with 10% vertebral body height loss. No significant retropulsion. Wedging of T5, T6, T7, and T8, without associated edema, likely remote compression fractures. Cord:  Normal signal and morphology. Paraspinal and other soft tissues: Trace bilateral pleural effusions with associated atelectasis. Right hepatic lobe cysts, for which no follow-up is currently indicated. Disc levels: No significant spinal canal stenosis or neural foraminal narrowing. MRI LUMBAR SPINE FINDINGS Segmentation:  5 lumbar type vertebral bodies. Alignment: Mild dextrocurvature of the lumbar spine. 3 mm anterolisthesis of T12 on L1 and 6 mm anterolisthesis of L4 on L5. Vertebrae: Subacute to chronic compression fracture of L1, with  approximately 50% vertebral body height loss. No significant retropulsion. No acute fracture or suspicious osseous lesion. Conus medullaris and cauda equina: Conus extends to the L1-L2 level. Conus and cauda equina appear normal. Paraspinal and other soft tissues: Negative. Disc levels: T12-L1: Trace anterolisthesis with disc unroofing. Mild facet  arthropathy. No spinal canal stenosis or neural foraminal narrowing. L1-L2: Mild disc bulge. Mild facet arthropathy. No spinal canal stenosis or neural foraminal narrowing. L2-L3: Mild disc bulge. Mild facet arthropathy. No spinal canal stenosis or neural foraminal narrowing. L3-L4: Mild disc bulge. Moderate facet arthropathy. Narrowing of the lateral recesses. No spinal canal stenosis or neural foraminal narrowing. L4-L5: Grade 1 anterolisthesis with disc unroofing. Moderate facet arthropathy. Narrowing of the lateral recesses. No spinal canal stenosis or neural foraminal narrowing. L5-S1: Mild disc bulge. Moderate facet arthropathy. No spinal canal stenosis or neural foraminal narrowing. IMPRESSION: 1. Evaluation is somewhat limited by motion artifact. Within this limitation, there is an acute to subacute compression fracture of T12, with approximately 20% vertebral body height loss, and a subacute compression fracture of T11, with approximately 10% vertebral body loss. No retropulsion or associated spinal canal stenosis. 2. Congenitally short pedicles and multilevel degenerative changes in the cervical spine, which cause severe spinal canal stenosis at C4-C5 and C6-C7 and moderate to severe spinal canal stenosis at C3-C4 and C5-C6. Multilevel cord flattening without cord signal abnormality. 3. Multilevel neural foraminal narrowing, which is moderate on the right at C3-C4 and C6-C7, mild bilaterally at C4-C5, and mild on the right at C5-C6 and C7-T1. 4. No significant spinal canal stenosis or neural foraminal narrowing in the thoracic or lumbar spine. 5. Narrowing of the lateral recesses at L3-L4 and L4-L5 could affect the descending L4 and L5 nerve roots, respectively. 6. Trace bilateral pleural effusions with associated atelectasis. Electronically Signed   By: Merilyn Baba M.D.   On: 05/24/2022 13:02   MR LUMBAR SPINE WO CONTRAST  Result Date: 05/24/2022 CLINICAL DATA:  Myelopathy EXAM: MRI CERVICAL,  THORACIC AND LUMBAR SPINE WITHOUT CONTRAST TECHNIQUE: Multiplanar and multiecho pulse sequences of the cervical spine, to include the craniocervical junction and cervicothoracic junction, and thoracic and lumbar spine, were obtained without intravenous contrast. COMPARISON:  MRI lumbar spine 10/27/2011, CT cervical spine 05/24/2022, correlation is also made with CT chest abdomen pelvis 05/23/2022 FINDINGS: Evaluation is somewhat limited by motion artifact. MRI CERVICAL SPINE FINDINGS Alignment: Trace anterolisthesis of C6 on C7 Vertebrae: No fracture, evidence of discitis, or bone lesion. Congenitally short pedicles, which narrow the AP diameter of the spinal canal. Cord: Normal cord signal. Multilevel cord flattening throughout the cervical spine. Posterior Fossa, vertebral arteries, paraspinal tissues: Negative. Disc levels: C2-C3: Minimal disc bulge. Facet arthropathy. Ligamentum flavum hypertrophy. Mild spinal canal stenosis. No neural foraminal narrowing. C3-C4: Mild disc bulge with central protrusion. Facet and uncovertebral hypertrophy. Ligamentum flavum hypertrophy. The disc bulge indents and deforms the ventral spinal cord, with cord flattening. Moderate to severe spinal canal stenosis. Moderate right neural foraminal narrowing. C4-C5: Disc height loss with disc bulge, which causes cord flattening. Facet and uncovertebral hypertrophy. Ligamentum flavum hypertrophy. Severe spinal canal stenosis. Mild bilateral neural foraminal narrowing. C5-C6: Disc height loss with mild disc bulge, which causes cord flattening. Facet and uncovertebral hypertrophy. Ligamentum flavum hypertrophy. Moderate to severe spinal canal stenosis. Mild right neural foraminal narrowing. C6-C7: Disc bulge with superimposed central protrusion which indents and deforms the spinal cord. Facet and uncovertebral hypertrophy. Ligamentum flavum hypertrophy. Severe spinal canal stenosis. Moderate right neural foraminal narrowing. C7-T1: Mild  disc bulge. Facet and uncovertebral hypertrophy. No spinal canal stenosis. Mild right neural foraminal narrowing. MRI THORACIC SPINE FINDINGS Alignment: Trace anterolisthesis of T2 on T3. Exaggeration of the normal thoracic kyphosis secondary to wedging midthoracic vertebral bodies S shaped curvature of the thoracolumbar spine, with levocurvature of the mid thoracic spine. Vertebrae: No suspicious osseous lesion. Acute to subacute compression T12, with approximately 20% vertebral body height loss. Subacute compression fracture of T11 with 10% vertebral body height loss. No significant retropulsion. Wedging of T5, T6, T7, and T8, without associated edema, likely remote compression fractures. Cord:  Normal signal and morphology. Paraspinal and other soft tissues: Trace bilateral pleural effusions with associated atelectasis. Right hepatic lobe cysts, for which no follow-up is currently indicated. Disc levels: No significant spinal canal stenosis or neural foraminal narrowing. MRI LUMBAR SPINE FINDINGS Segmentation:  5 lumbar type vertebral bodies. Alignment: Mild dextrocurvature of the lumbar spine. 3 mm anterolisthesis of T12 on L1 and 6 mm anterolisthesis of L4 on L5. Vertebrae: Subacute to chronic compression fracture of L1, with approximately 50% vertebral body height loss. No significant retropulsion. No acute fracture or suspicious osseous lesion. Conus medullaris and cauda equina: Conus extends to the L1-L2 level. Conus and cauda equina appear normal. Paraspinal and other soft tissues: Negative. Disc levels: T12-L1: Trace anterolisthesis with disc unroofing. Mild facet arthropathy. No spinal canal stenosis or neural foraminal narrowing. L1-L2: Mild disc bulge. Mild facet arthropathy. No spinal canal stenosis or neural foraminal narrowing. L2-L3: Mild disc bulge. Mild facet arthropathy. No spinal canal stenosis or neural foraminal narrowing. L3-L4: Mild disc bulge. Moderate facet arthropathy. Narrowing of the  lateral recesses. No spinal canal stenosis or neural foraminal narrowing. L4-L5: Grade 1 anterolisthesis with disc unroofing. Moderate facet arthropathy. Narrowing of the lateral recesses. No spinal canal stenosis or neural foraminal narrowing. L5-S1: Mild disc bulge. Moderate facet arthropathy. No spinal canal stenosis or neural foraminal narrowing. IMPRESSION: 1. Evaluation is somewhat limited by motion artifact. Within this limitation, there is an acute to subacute compression fracture of T12, with approximately 20% vertebral body height loss, and a subacute compression fracture of T11, with approximately 10% vertebral body loss. No retropulsion or associated spinal canal stenosis. 2. Congenitally short pedicles and multilevel degenerative changes in the cervical spine, which cause severe spinal canal stenosis at C4-C5 and C6-C7 and moderate to severe spinal canal stenosis at C3-C4 and C5-C6. Multilevel cord flattening without cord signal abnormality. 3. Multilevel neural foraminal narrowing, which is moderate on the right at C3-C4 and C6-C7, mild bilaterally at C4-C5, and mild on the right at C5-C6 and C7-T1. 4. No significant spinal canal stenosis or neural foraminal narrowing in the thoracic or lumbar spine. 5. Narrowing of the lateral recesses at L3-L4 and L4-L5 could affect the descending L4 and L5 nerve roots, respectively. 6. Trace bilateral pleural effusions with associated atelectasis. Electronically Signed   By: Merilyn Baba M.D.   On: 05/24/2022 13:02   CT CHEST ABDOMEN PELVIS W CONTRAST  Result Date: 05/23/2022 CLINICAL DATA:  Status post fall. EXAM: CT CHEST, ABDOMEN, AND PELVIS WITH CONTRAST TECHNIQUE: Multidetector CT imaging of the chest, abdomen and pelvis was performed following the standard protocol during bolus administration of intravenous contrast. RADIATION DOSE REDUCTION: This exam was performed according to the departmental dose-optimization program which includes automated exposure  control, adjustment of the mA and/or kV according to patient size and/or use of iterative reconstruction technique. CONTRAST:  64mL OMNIPAQUE IOHEXOL 350 MG/ML SOLN COMPARISON:  None Available. FINDINGS: CT CHEST FINDINGS  Cardiovascular: Heart size is mildly enlarged. No pericardial effusion. Aortic atherosclerosis and coronary artery calcifications. Mediastinum/Nodes: No enlarged mediastinal, hilar, or axillary lymph nodes. Thyroid gland, trachea, and esophagus demonstrate no significant findings. Lungs/Pleura: Mild centrilobular and paraseptal emphysema with diffuse bronchial wall thickening. No pleural fluid. Subpleural consolidation is noted overlying bilateral lung bases along with areas of subsegmental atelectasis and volume loss. No suspicious pulmonary nodule or mass. Musculoskeletal: Fractures are identified involving the lateral aspect of the left forth, sixth, seventh, and eighth ribs. There is also a fracture involving the posterior aspect of the left eleventh rib. There are age indeterminate compression deformities involving the T8, T12, and L1 vertebra. Most advanced at the L1 level where there is greater than 50% loss of the vertebral body height. Schmorl's node deformity involves the superior endplate of [id number]. CT ABDOMEN PELVIS FINDINGS Hepatobiliary: No hepatic injury or perihepatic hematoma. Cyst within posterior right lobe of liver measures 1.2 cm. Left lobe of liver cyst measures 1.1 cm. Several, subcentimeter, too small to characterize low-density liver lesions are also noted. Pancreas: Unremarkable. No pancreatic ductal dilatation or surrounding inflammatory changes. Spleen: No splenic injury or perisplenic hematoma. Adrenals/Urinary Tract: Normal adrenal glands. No nephrolithiasis, hydronephrosis or suspicious mass. Urinary bladder is unremarkable. Stomach/Bowel: Stomach is within normal limits. No evidence of bowel wall thickening, distention, or inflammatory changes. Vascular/Lymphatic:  Aortic atherosclerosis. No abdominopelvic adenopathy. Reproductive: Pessary device is identified within the vaginal apex. Uterus and adnexal structures are unremarkable. Other: No free fluid or fluid collections. Musculoskeletal: Age-indeterminate fracture deformity involving the L1 vertebra is noted with loss of approximately 60% of the vertebral body height. Anterolisthesis of L4 on L5 measures 6 mm. Moderate degenerative disc disease with vacuum disc noted at L4-5. IMPRESSION: 1. Acute fractures involving the lateral aspect of the left forth, sixth, seventh, and eighth ribs. There is also a fracture involving the posterior aspect of the left eleventh rib. 2. Bilateral subpleural consolidation and atelectasis identified within both lung bases. No pleural fluid noted. 3. Age-indeterminate compression deformities involving the T8, T12, and L1 vertebra. Most severe at the L1 level where there is greater than 50% loss of the vertebral body height. 4. No signs of solid organ injury within the chest, abdomen or pelvis. 5. Aortic Atherosclerosis (ICD10-I70.0) and Emphysema (ICD10-J43.9). Electronically Signed   By: Kerby Moors M.D.   On: 05/23/2022 16:15       LOS: 0 days   [facility] Pager on [email or web address]  05/25/2022, 9:42 AM

## 2022-05-25 NOTE — Progress Notes (Signed)
OT Cancellation Note  Patient Details Name: [name] MRN: [id number] DOB: [birth year]   Cancelled Treatment:    Reason Eval/Treat Not Completed: Other (comment) (Attempted again - pt now eating lunch and requested I come back a later time.)  Guillaume Weninger,HILLARY 05/25/2022, 12:43 PM

## 2022-05-25 NOTE — Evaluation (Signed)
Physical Therapy Evaluation Patient Details Name: [name] MRN: [id number] DOB: Nov 04, 1940 Today's Date: 05/25/2022  History of Present Illness  Pt is 81 year old presented [facility] on  05/22/22 with recurrent falls, LE weakness, and neck/back pain. X ray showed acute lt rib fx's #6,7,8,11. CT showed compression fx's T8, T12, L4 all age indeterminate. PMH - anxiety, neck pain, achilles rupture, TKR x 2, revision TKR, osteoporosis, macular degeneration.  Clinical Impression  Pt admitted with above diagnosis. Pt was able to stand and pivot to chair with mod assist and max cues. Needed assist to move RW as well. Pt will need SNF as husband cannot provide level of care that pt will need on d/c.  Will follow acutely.  Pt currently with functional limitations due to the deficits listed below (see PT Problem List). Pt will benefit from skilled PT to increase their independence and safety with mobility to allow discharge to the venue listed below.          Recommendations for follow up therapy are one component of a multi-disciplinary discharge planning process, led by the attending physician.  Recommendations may be updated based on patient status, additional functional criteria and insurance authorization.  Follow Up Recommendations Skilled nursing-short term rehab (<3 hours/day) Can patient physically be transported by private vehicle: Yes    Assistance Recommended at Discharge Frequent or constant Supervision/Assistance  Patient can return home with the following  Two people to help with walking and/or transfers;A lot of help with bathing/dressing/bathroom;Assistance with cooking/housework;Assist for transportation;Help with stairs or ramp for entrance    Equipment Recommendations None recommended by PT  Recommendations for Other Services       Functional Status Assessment Patient has had a recent decline in their functional status and demonstrates the ability to make significant  improvements in function in a reasonable and predictable amount of time.     Precautions / Restrictions Precautions Precautions: Fall Precaution Comments: rib fxs and compression fxs Restrictions Weight Bearing Restrictions:  (multiple rib fractures and compression fractures)      Mobility  Bed Mobility Overal bed mobility: Needs Assistance Bed Mobility: Rolling, Sidelying to Sit Rolling: Mod assist Sidelying to sit: Mod assist       General bed mobility comments: Pt with posterior lean and needed incr time to come to EOB with mod assist as well.  Needs assis with LEs and trunk.    Transfers Overall transfer level: Needs assistance Equipment used: Rolling walker (2 wheels) Transfers: Sit to/from Stand, Bed to chair/wheelchair/BSC Sit to Stand: Mod assist, From elevated surface   Step pivot transfers: Mod assist       General transfer comment: Pt needed mod assisst to power up as well as mod assist to take pivotal steps to chair with assist to move RW. Pt with flexed posture needing constant cues to look up and wide BOS with shuffle steps.    Ambulation/Gait                  Stairs            Wheelchair Mobility    Modified Rankin (Stroke Patients Only)       Balance Overall balance assessment: Needs assistance Sitting-balance support: Bilateral upper extremity supported, Feet supported Sitting balance-Leahy Scale: Poor Sitting balance - Comments: relies on UE support with significant postieirior lean Postural control: Posterior lean Standing balance support: Bilateral upper extremity supported, During functional activity Standing balance-Leahy Scale: Poor Standing balance comment: relies on extenral and  Ue support                             Pertinent Vitals/Pain Pain Assessment Pain Assessment: Faces Faces Pain Scale: Hurts even more Pain Location: left ribs Pain Descriptors / Indicators: Aching, Grimacing, Guarding Pain  Intervention(s): Limited activity within patient's tolerance, Monitored during session, Repositioned, Patient requesting pain meds-RN notified, RN gave pain meds during session    Home Living Family/patient expects to be discharged to:: Skilled nursing facility Living Arrangements: Spouse/significant other Available Help at Discharge: Family;Available 24 hours/day (supervision only as he cant lift pt) Type of Home: House Home Access: Stairs to enter Entrance Stairs-Rails: Right;Left;Can reach both Entrance Stairs-Number of Steps: 2 Alternate Level Stairs-Number of Steps: lift chair Home Layout: Two level Home Equipment: Curator (4 wheels);Cane - single point Additional Comments: fell 10 x in last year and reports 2 recent falls    Prior Function               Mobility Comments: used cane mostly and could walk by herself ADLs Comments: A with Adls recently     Hand Dominance   Dominant Hand: Right    Extremity/Trunk Assessment   Upper Extremity Assessment Upper Extremity Assessment: Defer to OT evaluation    Lower Extremity Assessment Lower Extremity Assessment: RLE deficits/detail;LLE deficits/detail RLE Deficits / Details: grossly 3-/5 LLE Deficits / Details: grossly 3-/5    Cervical / Trunk Assessment Cervical / Trunk Assessment: Kyphotic  Communication   Communication: No difficulties  Cognition Arousal/Alertness: Awake/alert Behavior During Therapy: WFL for tasks assessed/performed Overall Cognitive Status: Within Functional Limits for tasks assessed                                          General Comments      Exercises General Exercises - Lower Extremity Ankle Circles/Pumps: AROM, Both, 10 reps, Seated Long Arc Quad: AROM, Both, 10 reps, Seated   Assessment/Plan    PT Assessment Patient needs continued PT services  PT Problem List Decreased activity tolerance;Decreased balance;Decreased mobility;Decreased  strength;Decreased knowledge of use of DME;Decreased safety awareness;Decreased knowledge of precautions;Pain       PT Treatment Interventions DME instruction;Gait training;Functional mobility training;Therapeutic activities;Therapeutic exercise;Balance training;Patient/family education    PT Goals (Current goals can be found in the Care Plan section)  Acute Rehab PT Goals Patient Stated Goal: to get rehab PT Goal Formulation: With patient Time For Goal Achievement: 06/08/22 Potential to Achieve Goals: Good    Frequency Min 3X/week     Co-evaluation               AM-PAC PT "6 Clicks" Mobility  Outcome Measure Help needed turning from your back to your side while in a flat bed without using bedrails?: A Lot Help needed moving from lying on your back to sitting on the side of a flat bed without using bedrails?: A Lot Help needed moving to and from a bed to a chair (including a wheelchair)?: A Lot Help needed standing up from a chair using your arms (e.g., wheelchair or bedside chair)?: A Lot Help needed to walk in hospital room?: Total Help needed climbing 3-5 steps with a railing? : Total 6 Click Score: 10    End of Session Equipment Utilized During Treatment: Gait belt Activity Tolerance: Patient limited by fatigue;Patient limited by pain Patient  left: in chair;with call bell/phone within reach Nurse Communication: Mobility status PT Visit Diagnosis: Muscle weakness (generalized) (M62.81);Other abnormalities of gait and mobility (R26.89)    Time: [id number] PT Time Calculation (min) (ACUTE ONLY): 30 min   Charges:   PT Evaluation $PT Eval Moderate Complexity: 1 Mod PT Treatments $Therapeutic Activity: 8-22 mins        [facility] M,PT Acute Rehab Services [phone]   Alvira Philips 05/25/2022, 12:30 PM

## 2022-05-26 DIAGNOSIS — S2242XA Multiple fractures of ribs, left side, initial encounter for closed fracture: Secondary | ICD-10-CM | POA: Diagnosis not present

## 2022-05-26 DIAGNOSIS — M545 Low back pain, unspecified: Secondary | ICD-10-CM | POA: Diagnosis not present

## 2022-05-26 DIAGNOSIS — M542 Cervicalgia: Secondary | ICD-10-CM | POA: Diagnosis not present

## 2022-05-26 LAB — BASIC METABOLIC PANEL
Anion gap: 10 (ref 5–15)
BUN: 15 mg/dL (ref 8–23)
CO2: 27 mmol/L (ref 22–32)
Calcium: 8.6 mg/dL — ABNORMAL LOW (ref 8.9–10.3)
Chloride: 102 mmol/L (ref 98–111)
Creatinine, Ser: 0.6 mg/dL (ref 0.44–1.00)
GFR, Estimated: >60 mL/min (ref >60)
Glucose, Bld: 104 mg/dL — ABNORMAL HIGH (ref 70–99)
Potassium: 3.7 mmol/L (ref 3.5–5.1)
Sodium: 139 mmol/L (ref 135–145)

## 2022-05-26 LAB — MAGNESIUM: Magnesium: 2.2 mg/dL (ref 1.7–2.4)

## 2022-05-26 MED ORDER — POTASSIUM CHLORIDE CRYS ER 20 MEQ PO TBCR
20.0000 meq | EXTENDED_RELEASE_TABLET | Freq: Once | ORAL | Status: AC
  Administered 2022-05-26: 20 meq via ORAL
  Filled 2022-05-26: qty 1

## 2022-05-26 MED ORDER — SENNOSIDES-DOCUSATE SODIUM 8.6-50 MG PO TABS
2.0000 | ORAL_TABLET | Freq: Two times a day (BID) | ORAL | Status: DC
  Administered 2022-05-26 – 2022-05-27 (×4): 2 via ORAL
  Filled 2022-05-26 (×5): qty 2

## 2022-05-26 NOTE — Progress Notes (Signed)
[facility] PROGRESS NOTE   [name]:[id number] DOB: 03/29/1941 DOA: 05/22/2022  PCP: Thana Ates, MD  Brief History/Interval Summary: 81 y.o. female with medical history significant of anxiety and neck pain presenting with a fall.   She reports that in 2021 she had an Achilles rupture; she went to podiatry Karren Burly, DPM) and he reportedly told her that she was too chronically ill for surgical repair.  Instead, he sent her to PT and had her wear a boot and it eventually healed.  However, in the intervening 2 years she has become increasingly weak and needs assistive devices to get around even her house (mostly uses a cane for convenience).  Meanwhile, her neck is also weak and she has been to see Dr. Shon Baton who reportedly told her she has degenerative disease of the cervical and lumbar spine and referred her to pain management for injection because he agreed that she is not a surgical candidate.  She has had a number of recent falls from her legs giving out.  This happened the night prior to admission and she had significant left-sided rib pain as a result of fractures.   Consultants: Dr. Franky Macho with neurosurgery  Procedures: None yet    Subjective/Interval History: Patient mentioned that she was able to sit up from the chair yesterday.  Denies any issues overnight. Neck and back pain is stable.  No other complaints offered.      Assessment/Plan:  Back pain/neck pain/multiple compression fractures Patient with chronic history of neck and back pain but worse over the past few days due to recurrent falls.  She also mention lower extremity weakness. CT cervical spine did not show any acute fractures.  She had a CT scan of the chest abdomen and pelvis which showed compression fractures of T8, T12 and L4 1 although this was age-indeterminate. Patient does have weakness in bilateral lower extremities.  Her orthopedic doctors office was called, Dr. Shon Baton.  No recent  MRIs have been done on this patient in their office. Based on care everywhere looks like she has had an MRI Lumbar spine in May of this year which showed mild progression of multilevel degenerative disease.  There was no mention of any compression fractures at that time. Patient subsequently underwent MRI of the cervical thoracic and lumbar spine.  Multiple disc disease is noted.  Multiple compression fractures though most of them were thought to be subacute to chronic.  Spinal stenosis noted but no significant cord involvement. Patient seen by neurosurgery.  No role for surgical intervention at this time.   Seen by physical therapy.  SNF is recommended.  Pain is reasonably well-controlled.   No bladder issues reported.  Feels constipated.  Will initiate laxatives.  Rib fracture Patient noted to have fractures involving the left side.  No pneumothorax was noted. Incentive spirometry.  Pain control.  Repeat x-ray does not show any pneumothorax.  Recurrent falls See above for details.   Vitamin B12 deficiency Vitamin B12 level noted to be low at 265.  Folic acid level is 8.8.  Continue with cyanocobalamin injections which will be followed by oral supplementation.  Mood disorder Continue home medications.  Macular degeneration Stable.  Obesity Estimated body mass index is 30.41 kg/m as calculated from the following:   Height as of this encounter:  (1.727 m).   Weight as of this encounter: 90.7 kg.  DVT Prophylaxis: Lovenox Code Status: Full code Family Communication: Discussed with patient Disposition Plan: To be  determined  Status is: Inpatient Remains inpatient appropriate because: Spinal stenosis, ambulatory dysfunction, high fall risk       Medications: Scheduled:  cyanocobalamin  1,000 mcg Intramuscular Daily   Followed by   Melene Muller ON 05/30/2022] vitamin B-12  1,000 mcg Oral Daily   enoxaparin (LOVENOX) injection  40 mg Subcutaneous Daily   feeding supplement   237 mL Oral BID BM   FLUoxetine  60 mg Oral Daily   lidocaine  1 patch Transdermal Q24H   multivitamin with minerals  1 tablet Oral Daily   senna-docusate  2 tablet Oral BID   valACYclovir  500 mg Oral Daily   Continuous:  methocarbamol (ROBAXIN) IV     GBE:EFEOFHQRFXJOI **OR** acetaminophen, bisacodyl, hydrALAZINE, LORazepam, methocarbamol (ROBAXIN) IV, morphine injection, ondansetron **OR** ondansetron (ZOFRAN) IV, oxyCODONE, polyethylene glycol  Antibiotics: Anti-infectives (From admission, onward)    Start     Dose/Rate Route Frequency Ordered Stop   05/24/22 1000  valACYclovir (VALTREX) tablet 500 mg        500 mg Oral Daily 05/23/22 1700         Objective:  Vital Signs  Vitals:   05/25/22 1744 05/25/22 1956 05/25/22 2004 05/26/22 0809  BP: (!) 148/71 139/62 126/71 (!) 147/54  Pulse: 91 90 81 76  Resp: 18 19 17 18   Temp: 98.3 F (36.8 C) 97.9 F (36.6 C) 98.6 F (37 C) 98 F (36.7 C)  TempSrc: Oral   Oral  SpO2: 92% 91% 97% 91%  Weight:      Height:        Intake/Output Summary (Last 24 hours) at 05/26/2022 0904 Last data filed at 05/26/2022 0853 Gross per 24 hour  Intake 480 ml  Output 650 ml  Net -170 ml    Filed Weights   05/22/22 2203  Weight: 90.7 kg    General appearance: Awake alert.  In no distress Resp: Clear to auscultation bilaterally.  Normal effort Cardio: S1-S2 is normal regular.  No S3-S4.  No rubs murmurs or bruit GI: Abdomen is soft.  Nontender nondistended.  Bowel sounds are present normal.  No masses organomegaly Extremities: No edema.  Slightly better mobility of the lower extremities noted this morning.  Lab Results:  Data Reviewed: I have personally reviewed following labs and reports of the imaging studies  CBC: Recent Labs  Lab 05/23/22 1137 05/23/22 1144 05/24/22 0455  WBC 7.9  --  7.0  NEUTROABS 5.4  --   --   HGB 14.3 15.3* 13.0  HCT 45.2 45.0 40.6  MCV 89.3  --  89.0  PLT 187  --  164     Basic Metabolic  Panel: Recent Labs  Lab 05/23/22 1137 05/23/22 1144 05/24/22 0455 05/26/22 0331  NA 141 141 138 139  K 3.6 3.7 3.8 3.7  CL 103 103 104 102  CO2 29  --  26 27  GLUCOSE 89 89 114* 104*  BUN 12 14 16 15   CREATININE 0.61 0.50 0.62 0.60  CALCIUM 9.6  --  9.0 8.6*  MG  --   --   --  2.2     GFR: Estimated Creatinine Clearance: 63.9 mL/min (by C-G formula based on SCr of 0.6 mg/dL).   Radiology Studies: DG CHEST PORT 1 VIEW  Result Date: 05/25/2022 CLINICAL DATA:  Rib fractures EXAM: PORTABLE CHEST 1 VIEW COMPARISON:  Chest radiograph 05/23/2022 FINDINGS: Stable cardiac and mediastinal contours. Low lung volumes. Aortic atherosclerosis. Bibasilar heterogeneous pulmonary opacities. Known left rib fractures are not  well demonstrated on portable chest radiograph. Gaseous distended loops of bowel within the left upper quadrant. IMPRESSION: 1. Low lung volumes with basilar atelectasis. 2. Known left rib fractures are not well demonstrated on portable chest radiograph. Electronically Signed   By: Annia Belt M.D.   On: 05/25/2022 10:55   MR CERVICAL SPINE WO CONTRAST  Result Date: 05/24/2022 CLINICAL DATA:  Myelopathy EXAM: MRI CERVICAL, THORACIC AND LUMBAR SPINE WITHOUT CONTRAST TECHNIQUE: Multiplanar and multiecho pulse sequences of the cervical spine, to include the craniocervical junction and cervicothoracic junction, and thoracic and lumbar spine, were obtained without intravenous contrast. COMPARISON:  MRI lumbar spine 10/27/2011, CT cervical spine 05/24/2022, correlation is also made with CT chest abdomen pelvis 05/23/2022 FINDINGS: Evaluation is somewhat limited by motion artifact. MRI CERVICAL SPINE FINDINGS Alignment: Trace anterolisthesis of C6 on C7 Vertebrae: No fracture, evidence of discitis, or bone lesion. Congenitally short pedicles, which narrow the AP diameter of the spinal canal. Cord: Normal cord signal. Multilevel cord flattening throughout the cervical spine. Posterior Fossa,  vertebral arteries, paraspinal tissues: Negative. Disc levels: C2-C3: Minimal disc bulge. Facet arthropathy. Ligamentum flavum hypertrophy. Mild spinal canal stenosis. No neural foraminal narrowing. C3-C4: Mild disc bulge with central protrusion. Facet and uncovertebral hypertrophy. Ligamentum flavum hypertrophy. The disc bulge indents and deforms the ventral spinal cord, with cord flattening. Moderate to severe spinal canal stenosis. Moderate right neural foraminal narrowing. C4-C5: Disc height loss with disc bulge, which causes cord flattening. Facet and uncovertebral hypertrophy. Ligamentum flavum hypertrophy. Severe spinal canal stenosis. Mild bilateral neural foraminal narrowing. C5-C6: Disc height loss with mild disc bulge, which causes cord flattening. Facet and uncovertebral hypertrophy. Ligamentum flavum hypertrophy. Moderate to severe spinal canal stenosis. Mild right neural foraminal narrowing. C6-C7: Disc bulge with superimposed central protrusion which indents and deforms the spinal cord. Facet and uncovertebral hypertrophy. Ligamentum flavum hypertrophy. Severe spinal canal stenosis. Moderate right neural foraminal narrowing. C7-T1: Mild disc bulge. Facet and uncovertebral hypertrophy. No spinal canal stenosis. Mild right neural foraminal narrowing. MRI THORACIC SPINE FINDINGS Alignment: Trace anterolisthesis of T2 on T3. Exaggeration of the normal thoracic kyphosis secondary to wedging midthoracic vertebral bodies S shaped curvature of the thoracolumbar spine, with levocurvature of the mid thoracic spine. Vertebrae: No suspicious osseous lesion. Acute to subacute compression T12, with approximately 20% vertebral body height loss. Subacute compression fracture of T11 with 10% vertebral body height loss. No significant retropulsion. Wedging of T5, T6, T7, and T8, without associated edema, likely remote compression fractures. Cord:  Normal signal and morphology. Paraspinal and other soft tissues: Trace  bilateral pleural effusions with associated atelectasis. Right hepatic lobe cysts, for which no follow-up is currently indicated. Disc levels: No significant spinal canal stenosis or neural foraminal narrowing. MRI LUMBAR SPINE FINDINGS Segmentation:  5 lumbar type vertebral bodies. Alignment: Mild dextrocurvature of the lumbar spine. 3 mm anterolisthesis of T12 on L1 and 6 mm anterolisthesis of L4 on L5. Vertebrae: Subacute to chronic compression fracture of L1, with approximately 50% vertebral body height loss. No significant retropulsion. No acute fracture or suspicious osseous lesion. Conus medullaris and cauda equina: Conus extends to the L1-L2 level. Conus and cauda equina appear normal. Paraspinal and other soft tissues: Negative. Disc levels: T12-L1: Trace anterolisthesis with disc unroofing. Mild facet arthropathy. No spinal canal stenosis or neural foraminal narrowing. L1-L2: Mild disc bulge. Mild facet arthropathy. No spinal canal stenosis or neural foraminal narrowing. L2-L3: Mild disc bulge. Mild facet arthropathy. No spinal canal stenosis or neural foraminal narrowing. L3-L4: Mild disc bulge.  Moderate facet arthropathy. Narrowing of the lateral recesses. No spinal canal stenosis or neural foraminal narrowing. L4-L5: Grade 1 anterolisthesis with disc unroofing. Moderate facet arthropathy. Narrowing of the lateral recesses. No spinal canal stenosis or neural foraminal narrowing. L5-S1: Mild disc bulge. Moderate facet arthropathy. No spinal canal stenosis or neural foraminal narrowing. IMPRESSION: 1. Evaluation is somewhat limited by motion artifact. Within this limitation, there is an acute to subacute compression fracture of T12, with approximately 20% vertebral body height loss, and a subacute compression fracture of T11, with approximately 10% vertebral body loss. No retropulsion or associated spinal canal stenosis. 2. Congenitally short pedicles and multilevel degenerative changes in the cervical  spine, which cause severe spinal canal stenosis at C4-C5 and C6-C7 and moderate to severe spinal canal stenosis at C3-C4 and C5-C6. Multilevel cord flattening without cord signal abnormality. 3. Multilevel neural foraminal narrowing, which is moderate on the right at C3-C4 and C6-C7, mild bilaterally at C4-C5, and mild on the right at C5-C6 and C7-T1. 4. No significant spinal canal stenosis or neural foraminal narrowing in the thoracic or lumbar spine. 5. Narrowing of the lateral recesses at L3-L4 and L4-L5 could affect the descending L4 and L5 nerve roots, respectively. 6. Trace bilateral pleural effusions with associated atelectasis. Electronically Signed   By: Wiliam Ke M.D.   On: 05/24/2022 13:02   MR THORACIC SPINE WO CONTRAST  Result Date: 05/24/2022 CLINICAL DATA:  Myelopathy EXAM: MRI CERVICAL, THORACIC AND LUMBAR SPINE WITHOUT CONTRAST TECHNIQUE: Multiplanar and multiecho pulse sequences of the cervical spine, to include the craniocervical junction and cervicothoracic junction, and thoracic and lumbar spine, were obtained without intravenous contrast. COMPARISON:  MRI lumbar spine 10/27/2011, CT cervical spine 05/24/2022, correlation is also made with CT chest abdomen pelvis 05/23/2022 FINDINGS: Evaluation is somewhat limited by motion artifact. MRI CERVICAL SPINE FINDINGS Alignment: Trace anterolisthesis of C6 on C7 Vertebrae: No fracture, evidence of discitis, or bone lesion. Congenitally short pedicles, which narrow the AP diameter of the spinal canal. Cord: Normal cord signal. Multilevel cord flattening throughout the cervical spine. Posterior Fossa, vertebral arteries, paraspinal tissues: Negative. Disc levels: C2-C3: Minimal disc bulge. Facet arthropathy. Ligamentum flavum hypertrophy. Mild spinal canal stenosis. No neural foraminal narrowing. C3-C4: Mild disc bulge with central protrusion. Facet and uncovertebral hypertrophy. Ligamentum flavum hypertrophy. The disc bulge indents and deforms  the ventral spinal cord, with cord flattening. Moderate to severe spinal canal stenosis. Moderate right neural foraminal narrowing. C4-C5: Disc height loss with disc bulge, which causes cord flattening. Facet and uncovertebral hypertrophy. Ligamentum flavum hypertrophy. Severe spinal canal stenosis. Mild bilateral neural foraminal narrowing. C5-C6: Disc height loss with mild disc bulge, which causes cord flattening. Facet and uncovertebral hypertrophy. Ligamentum flavum hypertrophy. Moderate to severe spinal canal stenosis. Mild right neural foraminal narrowing. C6-C7: Disc bulge with superimposed central protrusion which indents and deforms the spinal cord. Facet and uncovertebral hypertrophy. Ligamentum flavum hypertrophy. Severe spinal canal stenosis. Moderate right neural foraminal narrowing. C7-T1: Mild disc bulge. Facet and uncovertebral hypertrophy. No spinal canal stenosis. Mild right neural foraminal narrowing. MRI THORACIC SPINE FINDINGS Alignment: Trace anterolisthesis of T2 on T3. Exaggeration of the normal thoracic kyphosis secondary to wedging midthoracic vertebral bodies S shaped curvature of the thoracolumbar spine, with levocurvature of the mid thoracic spine. Vertebrae: No suspicious osseous lesion. Acute to subacute compression T12, with approximately 20% vertebral body height loss. Subacute compression fracture of T11 with 10% vertebral body height loss. No significant retropulsion. Wedging of T5, T6, T7, and T8, without associated edema,  likely remote compression fractures. Cord:  Normal signal and morphology. Paraspinal and other soft tissues: Trace bilateral pleural effusions with associated atelectasis. Right hepatic lobe cysts, for which no follow-up is currently indicated. Disc levels: No significant spinal canal stenosis or neural foraminal narrowing. MRI LUMBAR SPINE FINDINGS Segmentation:  5 lumbar type vertebral bodies. Alignment: Mild dextrocurvature of the lumbar spine. 3 mm  anterolisthesis of T12 on L1 and 6 mm anterolisthesis of L4 on L5. Vertebrae: Subacute to chronic compression fracture of L1, with approximately 50% vertebral body height loss. No significant retropulsion. No acute fracture or suspicious osseous lesion. Conus medullaris and cauda equina: Conus extends to the L1-L2 level. Conus and cauda equina appear normal. Paraspinal and other soft tissues: Negative. Disc levels: T12-L1: Trace anterolisthesis with disc unroofing. Mild facet arthropathy. No spinal canal stenosis or neural foraminal narrowing. L1-L2: Mild disc bulge. Mild facet arthropathy. No spinal canal stenosis or neural foraminal narrowing. L2-L3: Mild disc bulge. Mild facet arthropathy. No spinal canal stenosis or neural foraminal narrowing. L3-L4: Mild disc bulge. Moderate facet arthropathy. Narrowing of the lateral recesses. No spinal canal stenosis or neural foraminal narrowing. L4-L5: Grade 1 anterolisthesis with disc unroofing. Moderate facet arthropathy. Narrowing of the lateral recesses. No spinal canal stenosis or neural foraminal narrowing. L5-S1: Mild disc bulge. Moderate facet arthropathy. No spinal canal stenosis or neural foraminal narrowing. IMPRESSION: 1. Evaluation is somewhat limited by motion artifact. Within this limitation, there is an acute to subacute compression fracture of T12, with approximately 20% vertebral body height loss, and a subacute compression fracture of T11, with approximately 10% vertebral body loss. No retropulsion or associated spinal canal stenosis. 2. Congenitally short pedicles and multilevel degenerative changes in the cervical spine, which cause severe spinal canal stenosis at C4-C5 and C6-C7 and moderate to severe spinal canal stenosis at C3-C4 and C5-C6. Multilevel cord flattening without cord signal abnormality. 3. Multilevel neural foraminal narrowing, which is moderate on the right at C3-C4 and C6-C7, mild bilaterally at C4-C5, and mild on the right at C5-C6  and C7-T1. 4. No significant spinal canal stenosis or neural foraminal narrowing in the thoracic or lumbar spine. 5. Narrowing of the lateral recesses at L3-L4 and L4-L5 could affect the descending L4 and L5 nerve roots, respectively. 6. Trace bilateral pleural effusions with associated atelectasis. Electronically Signed   By: Wiliam Ke M.D.   On: 05/24/2022 13:02   MR LUMBAR SPINE WO CONTRAST  Result Date: 05/24/2022 CLINICAL DATA:  Myelopathy EXAM: MRI CERVICAL, THORACIC AND LUMBAR SPINE WITHOUT CONTRAST TECHNIQUE: Multiplanar and multiecho pulse sequences of the cervical spine, to include the craniocervical junction and cervicothoracic junction, and thoracic and lumbar spine, were obtained without intravenous contrast. COMPARISON:  MRI lumbar spine 10/27/2011, CT cervical spine 05/24/2022, correlation is also made with CT chest abdomen pelvis 05/23/2022 FINDINGS: Evaluation is somewhat limited by motion artifact. MRI CERVICAL SPINE FINDINGS Alignment: Trace anterolisthesis of C6 on C7 Vertebrae: No fracture, evidence of discitis, or bone lesion. Congenitally short pedicles, which narrow the AP diameter of the spinal canal. Cord: Normal cord signal. Multilevel cord flattening throughout the cervical spine. Posterior Fossa, vertebral arteries, paraspinal tissues: Negative. Disc levels: C2-C3: Minimal disc bulge. Facet arthropathy. Ligamentum flavum hypertrophy. Mild spinal canal stenosis. No neural foraminal narrowing. C3-C4: Mild disc bulge with central protrusion. Facet and uncovertebral hypertrophy. Ligamentum flavum hypertrophy. The disc bulge indents and deforms the ventral spinal cord, with cord flattening. Moderate to severe spinal canal stenosis. Moderate right neural foraminal narrowing. C4-C5: Disc height loss  with disc bulge, which causes cord flattening. Facet and uncovertebral hypertrophy. Ligamentum flavum hypertrophy. Severe spinal canal stenosis. Mild bilateral neural foraminal narrowing.  C5-C6: Disc height loss with mild disc bulge, which causes cord flattening. Facet and uncovertebral hypertrophy. Ligamentum flavum hypertrophy. Moderate to severe spinal canal stenosis. Mild right neural foraminal narrowing. C6-C7: Disc bulge with superimposed central protrusion which indents and deforms the spinal cord. Facet and uncovertebral hypertrophy. Ligamentum flavum hypertrophy. Severe spinal canal stenosis. Moderate right neural foraminal narrowing. C7-T1: Mild disc bulge. Facet and uncovertebral hypertrophy. No spinal canal stenosis. Mild right neural foraminal narrowing. MRI THORACIC SPINE FINDINGS Alignment: Trace anterolisthesis of T2 on T3. Exaggeration of the normal thoracic kyphosis secondary to wedging midthoracic vertebral bodies S shaped curvature of the thoracolumbar spine, with levocurvature of the mid thoracic spine. Vertebrae: No suspicious osseous lesion. Acute to subacute compression T12, with approximately 20% vertebral body height loss. Subacute compression fracture of T11 with 10% vertebral body height loss. No significant retropulsion. Wedging of T5, T6, T7, and T8, without associated edema, likely remote compression fractures. Cord:  Normal signal and morphology. Paraspinal and other soft tissues: Trace bilateral pleural effusions with associated atelectasis. Right hepatic lobe cysts, for which no follow-up is currently indicated. Disc levels: No significant spinal canal stenosis or neural foraminal narrowing. MRI LUMBAR SPINE FINDINGS Segmentation:  5 lumbar type vertebral bodies. Alignment: Mild dextrocurvature of the lumbar spine. 3 mm anterolisthesis of T12 on L1 and 6 mm anterolisthesis of L4 on L5. Vertebrae: Subacute to chronic compression fracture of L1, with approximately 50% vertebral body height loss. No significant retropulsion. No acute fracture or suspicious osseous lesion. Conus medullaris and cauda equina: Conus extends to the L1-L2 level. Conus and cauda equina appear  normal. Paraspinal and other soft tissues: Negative. Disc levels: T12-L1: Trace anterolisthesis with disc unroofing. Mild facet arthropathy. No spinal canal stenosis or neural foraminal narrowing. L1-L2: Mild disc bulge. Mild facet arthropathy. No spinal canal stenosis or neural foraminal narrowing. L2-L3: Mild disc bulge. Mild facet arthropathy. No spinal canal stenosis or neural foraminal narrowing. L3-L4: Mild disc bulge. Moderate facet arthropathy. Narrowing of the lateral recesses. No spinal canal stenosis or neural foraminal narrowing. L4-L5: Grade 1 anterolisthesis with disc unroofing. Moderate facet arthropathy. Narrowing of the lateral recesses. No spinal canal stenosis or neural foraminal narrowing. L5-S1: Mild disc bulge. Moderate facet arthropathy. No spinal canal stenosis or neural foraminal narrowing. IMPRESSION: 1. Evaluation is somewhat limited by motion artifact. Within this limitation, there is an acute to subacute compression fracture of T12, with approximately 20% vertebral body height loss, and a subacute compression fracture of T11, with approximately 10% vertebral body loss. No retropulsion or associated spinal canal stenosis. 2. Congenitally short pedicles and multilevel degenerative changes in the cervical spine, which cause severe spinal canal stenosis at C4-C5 and C6-C7 and moderate to severe spinal canal stenosis at C3-C4 and C5-C6. Multilevel cord flattening without cord signal abnormality. 3. Multilevel neural foraminal narrowing, which is moderate on the right at C3-C4 and C6-C7, mild bilaterally at C4-C5, and mild on the right at C5-C6 and C7-T1. 4. No significant spinal canal stenosis or neural foraminal narrowing in the thoracic or lumbar spine. 5. Narrowing of the lateral recesses at L3-L4 and L4-L5 could affect the descending L4 and L5 nerve roots, respectively. 6. Trace bilateral pleural effusions with associated atelectasis. Electronically Signed   By: Wiliam Ke M.D.   On:  05/24/2022 13:02       LOS: 1 day   [name]  [name]  [facility] Pager on [email or web address]  05/26/2022, 9:04 AM

## 2022-05-26 NOTE — Progress Notes (Signed)
Patient saturating at 90% on RA. Patient reports no SOB at this time. Patient put on2L [place] saturating at 93% O2. Patient given IS earlier in the day and instructed on purpose and how to use it. Dr. Georgiana Spinner made aware

## 2022-05-26 NOTE — Progress Notes (Addendum)
CSW spoke with patient at bedside to present her with bed offers. Patient accepted offer from [facility]. CSW explained that insurance authorization would be initiated.   CSW spoke with patient's husband Maisie Fus to inform him of information.  CSW submitted clinicals to [place] [facility] for insurance authorization.  Edwin Dada, MSW, LCSW Transitions of Care  Clinical Social Worker II [phone]

## 2022-05-26 NOTE — Evaluation (Signed)
Occupational Therapy Evaluation Patient Details Name: [name] MRN: [id number] DOB: [birth year] Today's Date: 05/26/2022   History of Present Illness Pt is 81 year old presented to [facility] on  05/22/22 with recurrent falls, LE weakness, and neck/back pain. X ray showed acute lt rib fx's #6,7,8,11. CT showed compression fx's T8, T12, L4 all age indeterminate. PMH - anxiety, neck pain, achilles rupture, TKR x 2, revision TKR, osteoporosis, macular degeneration.   Clinical Impression   Patient admitted with the diagnosis above.  PTA she lives with her spouse at home, walks with a cane, and did need support for lower body ADL.  Primary deficit is pain, and currently she is needing up to Mod A for basic mobility and ADL completion from a sit to stand level.  OT will follow in the acute setting, but SNF has been recommended for post acute rehab prior to returning home.        Recommendations for follow up therapy are one component of a multi-disciplinary discharge planning process, led by the attending physician.  Recommendations may be updated based on patient status, additional functional criteria and insurance authorization.   Follow Up Recommendations  Skilled nursing-short term rehab (<3 hours/day)     Assistance Recommended at Discharge Frequent or constant Supervision/Assistance  Patient can return home with the following Assist for transportation;Assistance with cooking/housework;A lot of help with walking and/or transfers;A lot of help with bathing/dressing/bathroom    Functional Status Assessment  Patient has had a recent decline in their functional status and demonstrates the ability to make significant improvements in function in a reasonable and predictable amount of time.  Equipment Recommendations  None recommended by OT    Recommendations for Other Services       Precautions / Restrictions Precautions Precautions: Fall Precaution Comments: rib fxs and compression  fxs Restrictions Weight Bearing Restrictions: No      Mobility Bed Mobility Overal bed mobility: Needs Assistance Bed Mobility: Supine to Sit, Sit to Supine   Sidelying to sit: Mod assist Supine to sit: Mod assist          Transfers Overall transfer level: Needs assistance Equipment used: Rolling walker (2 wheels) Transfers: Sit to/from Stand, Bed to chair/wheelchair/BSC Sit to Stand: Mod assist, From elevated surface     Step pivot transfers: Mod assist            Balance Overall balance assessment: Needs assistance Sitting-balance support: Bilateral upper extremity supported, Feet supported Sitting balance-Leahy Scale: Fair     Standing balance support: Bilateral upper extremity supported, During functional activity Standing balance-Leahy Scale: Poor                             ADL either performed or assessed with clinical judgement   ADL Overall ADL's : Needs assistance/impaired Eating/Feeding: Set up;Bed level   Grooming: Wash/dry hands;Wash/dry face;Set up;Bed level   Upper Body Bathing: Minimal assistance;Sitting   Lower Body Bathing: Maximal assistance;Sit to/from stand   Upper Body Dressing : Moderate assistance;Sitting   Lower Body Dressing: Maximal assistance;Sit to/from stand   Toilet Transfer: Moderate assistance;Rolling walker (2 wheels);Stand-pivot;BSC/3in1                   Vision Baseline Vision/History: 1 Wears glasses Patient Visual Report: No change from baseline       Perception     Praxis      Pertinent Vitals/Pain Pain Assessment Faces Pain Scale: Hurts even more  Pain Location: left ribs Pain Descriptors / Indicators: Aching, Grimacing, Guarding Pain Intervention(s): Monitored during session     Hand Dominance Right   Extremity/Trunk Assessment Upper Extremity Assessment Upper Extremity Assessment: Generalized weakness   Lower Extremity Assessment Lower Extremity Assessment: Defer to PT  evaluation   Cervical / Trunk Assessment Cervical / Trunk Assessment: Kyphotic   Communication Communication Communication: No difficulties   Cognition Arousal/Alertness: Awake/alert Behavior During Therapy: WFL for tasks assessed/performed Overall Cognitive Status: Within Functional Limits for tasks assessed                                       General Comments   VSS on RA    Exercises     Shoulder Instructions      Home Living Family/patient expects to be discharged to:: Private residence Living Arrangements: Spouse/significant other Available Help at Discharge: Family;Available 24 hours/day Type of Home: House Home Access: Stairs to enter [employer] of Steps: 2 Entrance Stairs-Rails: Right;Left;Can reach both Home Layout: Two level Alternate Level Stairs-Number of Steps: lift chair       Bathroom Toilet: Handicapped height     Home Equipment: Administrator (4 wheels);Cane - single point          Prior Functioning/Environment               Mobility Comments: used cane mostly and could walk by herself ADLs Comments: A with Adls recently        OT Problem List: Decreased activity tolerance;Impaired balance (sitting and/or standing);Pain      OT Treatment/Interventions: Self-care/ADL training;Therapeutic activities;Patient/family education;Balance training;DME and/or AE instruction    OT Goals(Current goals can be found in the care plan section) Acute Rehab OT Goals Patient Stated Goal: Go to rehab and then return home OT Goal Formulation: With patient Time For Goal Achievement: 06/08/22 Potential to Achieve Goals: Good ADL Goals Pt Will Perform Grooming: with set-up;sitting Pt Will Perform Lower Body Dressing: with min assist;sit to/from stand;with adaptive equipment Pt Will Transfer to Toilet: with supervision;ambulating;regular height toilet  OT Frequency: Min 2X/week    Co-evaluation               AM-PAC OT "6 Clicks" Daily Activity     Outcome Measure Help from another person eating meals?: None Help from another person taking care of personal grooming?: None Help from another person toileting, which includes using toliet, bedpan, or urinal?: A Lot Help from another person bathing (including washing, rinsing, drying)?: A Lot Help from another person to put on and taking off regular upper body clothing?: A Lot Help from another person to put on and taking off regular lower body clothing?: A Lot 6 Click Score: 16   End of Session Equipment Utilized During Treatment: Rolling walker (2 wheels) Nurse Communication: Mobility status  Activity Tolerance: Patient limited by pain Patient left: in bed;with call bell/phone within reach  OT Visit Diagnosis: Pain                Time: 1610-9604 OT Time Calculation (min): 21 min Charges:  OT General Charges $OT Visit: 1 Visit OT Evaluation $OT Eval Moderate Complexity: 1 Mod  05/26/2022  RP, OTR/L  Acute Rehabilitation Services  Office:  [phone]   Suzanna Obey 05/26/2022, 1:44 PM

## 2022-05-27 DIAGNOSIS — S2242XA Multiple fractures of ribs, left side, initial encounter for closed fracture: Secondary | ICD-10-CM | POA: Diagnosis not present

## 2022-05-27 MED ORDER — ACETAMINOPHEN 325 MG PO TABS: 650.0000 mg | ORAL_TABLET | Freq: Four times a day (QID) | ORAL | Status: DC | PRN

## 2022-05-27 MED ORDER — SENNOSIDES-DOCUSATE SODIUM 8.6-50 MG PO TABS: 2.0000 | ORAL_TABLET | Freq: Two times a day (BID) | ORAL | Status: DC

## 2022-05-27 MED ORDER — POLYETHYLENE GLYCOL 3350 17 G PO PACK: 17.0000 g | PACK | Freq: Every day | ORAL | 0 refills | Status: DC | PRN

## 2022-05-27 MED ORDER — OXYCODONE HCL 5 MG PO TABS: 5.0000 mg | ORAL_TABLET | ORAL | 0 refills | Status: DC | PRN

## 2022-05-27 MED ORDER — LIDOCAINE 5 % EX PTCH: 1.0000 | MEDICATED_PATCH | CUTANEOUS | 0 refills | Status: DC

## 2022-05-27 MED ORDER — LORAZEPAM 0.5 MG PO TABS: 0.5000 mg | ORAL_TABLET | Freq: Every day | ORAL | 0 refills | Status: DC | PRN

## 2022-05-27 MED ORDER — METHOCARBAMOL 500 MG PO TABS: 500.0000 mg | ORAL_TABLET | Freq: Four times a day (QID) | ORAL | Status: DC | PRN

## 2022-05-27 MED ORDER — ENSURE ENLIVE PO LIQD: 237.0000 mL | Freq: Two times a day (BID) | ORAL | 12 refills | Status: DC

## 2022-05-27 MED ORDER — CYANOCOBALAMIN 1000 MCG PO TABS: 1000.0000 ug | ORAL_TABLET | Freq: Every day | ORAL | Status: DC

## 2022-05-27 NOTE — Progress Notes (Signed)
[facility] PROGRESS NOTE   [name] [id number] DOB: 13-Mar-1941 DOA: 05/22/2022  PCP: Charlane Ferretti, MD  Brief History/Interval Summary: 81 y.o. female with medical history significant of anxiety and neck pain presenting with a fall.   She reports that in 2021 she had an Achilles rupture; she went to podiatry Harlow Mares, DPM) and he reportedly told her that she was too chronically ill for surgical repair.  Instead, he sent her to PT and had her wear a boot and it eventually healed.  However, in the intervening 2 years she has become increasingly weak and needs assistive devices to get around even her house (mostly uses a cane for convenience).  Meanwhile, her neck is also weak and she has been to see Dr. Rolena Infante who reportedly told her she has degenerative disease of the cervical and lumbar spine and referred her to pain management for injection because he agreed that she is not a surgical candidate.  She has had a number of recent falls from her legs giving out.  This happened the night prior to admission and she had significant left-sided rib pain as a result of fractures.   Consultants: Dr. Christella Noa with neurosurgery  Procedures: None yet    Subjective/Interval History: Patient denies any complaints.  Continues to have back pain at times.  Lower extremity weakness persists.    Assessment/Plan:  Back pain/neck pain/multiple compression fractures Patient with chronic history of neck and back pain but worse over the past few days due to recurrent falls.  She also mention lower extremity weakness. CT cervical spine did not show any acute fractures.  She had a CT scan of the chest abdomen and pelvis which showed compression fractures of T8, T12 and L4 1 although this was age-indeterminate. Patient does have weakness in bilateral lower extremities.  Her orthopedic doctors office was called, Dr. Rolena Infante.  No recent MRIs have been done on this patient in their  office. Based on care everywhere looks like she has had an MRI Lumbar spine in May of this year which showed mild progression of multilevel degenerative disease.  There was no mention of any compression fractures at that time. Patient subsequently underwent MRI of the cervical thoracic and lumbar spine.  Multiple disc disease is noted.  Multiple compression fractures though most of them were thought to be subacute to chronic.  Spinal stenosis noted but no significant cord involvement. Patient seen by neurosurgery.  No role for surgical intervention at this time.   Seen by physical therapy.  SNF is recommended.  Pain is reasonably well-controlled.   Patient seems to be stable for the most part. Did have a bowel movement yesterday.  Rib fracture Patient noted to have fractures involving the left side.  No pneumothorax was noted. Incentive spirometry.  Pain control.  Repeat x-ray does not show any pneumothorax.  Recurrent falls See above for details.   Vitamin B12 deficiency Vitamin B12 level noted to be low at 265.  Folic acid level is 8.8.  Continue with cyanocobalamin injections which will be followed by oral supplementation.  Mood disorder Continue home medications.  Macular degeneration Stable.  Obesity Estimated body mass index is 30.41 kg/m as calculated from the following:   Height as of this encounter: 5\' 8"  (1.727 m).   Weight as of this encounter: 90.7 kg.  DVT Prophylaxis: Lovenox Code Status: Full code Family Communication: Discussed with patient and her son. Disposition Plan: SNF  Status is: Inpatient Remains inpatient appropriate because:  Spinal stenosis, ambulatory dysfunction, high fall risk       Medications: Scheduled:  cyanocobalamin  1,000 mcg Intramuscular Daily   Followed by   Melene Muller ON 05/30/2022] vitamin B-12  1,000 mcg Oral Daily   enoxaparin (LOVENOX) injection  40 mg Subcutaneous Daily   feeding supplement  237 mL Oral BID BM   FLUoxetine  60  mg Oral Daily   lidocaine  1 patch Transdermal Q24H   multivitamin with minerals  1 tablet Oral Daily   senna-docusate  2 tablet Oral BID   valACYclovir  500 mg Oral Daily   Continuous:  methocarbamol (ROBAXIN) IV     IOM:BTDHRCBULAGTX **OR** acetaminophen, bisacodyl, hydrALAZINE, LORazepam, methocarbamol (ROBAXIN) IV, morphine injection, ondansetron **OR** ondansetron (ZOFRAN) IV, oxyCODONE, polyethylene glycol  Antibiotics: Anti-infectives (From admission, onward)    Start     Dose/Rate Route Frequency Ordered Stop   05/24/22 1000  valACYclovir (VALTREX) tablet 500 mg        500 mg Oral Daily 05/23/22 1700         Objective:  Vital Signs  Vitals:   05/26/22 1602 05/26/22 2000 05/26/22 2230 05/27/22 0518  BP:  (!) 157/69 (!) 141/93 (!) 150/85  Pulse:  87 82 83  Resp:  18 17 16   Temp:  98.7 F (37.1 C) 98.1 F (36.7 C) 98.2 F (36.8 C)  TempSrc:  Oral Oral Oral  SpO2: 93% 93% 93% 94%  Weight:      Height:       No intake or output data in the 24 hours ending 05/27/22 1025  Filed Weights   05/22/22 2203  Weight: 90.7 kg    General appearance: Awake alert.  In no distress Resp: Clear to auscultation bilaterally.  Normal effort Cardio: S1-S2 is normal regular.  No S3-S4.  No rubs murmurs or bruit GI: Abdomen is soft.  Nontender nondistended.  Bowel sounds are present normal.  No masses organomegaly Extremities: No edema.  Some improvement in the lower extremity weakness is noted but unchanged from yesterday.  Lab Results:  Data Reviewed: I have personally reviewed following labs and reports of the imaging studies  CBC: Recent Labs  Lab 05/23/22 1137 05/23/22 1144 05/24/22 0455  WBC 7.9  --  7.0  NEUTROABS 5.4  --   --   HGB 14.3 15.3* 13.0  HCT 45.2 45.0 40.6  MCV 89.3  --  89.0  PLT 187  --  164     Basic Metabolic Panel: Recent Labs  Lab 05/23/22 1137 05/23/22 1144 05/24/22 0455 05/26/22 0331  NA 141 141 138 139  K 3.6 3.7 3.8 3.7  CL 103  103 104 102  CO2 29  --  26 27  GLUCOSE 89 89 114* 104*  BUN 12 14 16 15   CREATININE 0.61 0.50 0.62 0.60  CALCIUM 9.6  --  9.0 8.6*  MG  --   --   --  2.2     GFR: Estimated Creatinine Clearance: 63.9 mL/min (by C-G formula based on SCr of 0.6 mg/dL).   Radiology Studies: No results found.     LOS: 2 days   Janissa Bertram 14/09/23  [facility] Pager on [email or web address]  05/27/2022, 10:25 AM

## 2022-05-28 DIAGNOSIS — D649 Anemia, unspecified: Secondary | ICD-10-CM | POA: Diagnosis not present

## 2022-05-28 DIAGNOSIS — E559 Vitamin D deficiency, unspecified: Secondary | ICD-10-CM | POA: Diagnosis not present

## 2022-05-28 DIAGNOSIS — S2239XA Fracture of one rib, unspecified side, initial encounter for closed fracture: Secondary | ICD-10-CM | POA: Diagnosis not present

## 2022-05-28 DIAGNOSIS — Z79899 Other long term (current) drug therapy: Secondary | ICD-10-CM | POA: Diagnosis not present

## 2022-05-28 DIAGNOSIS — R293 Abnormal posture: Secondary | ICD-10-CM | POA: Diagnosis not present

## 2022-05-28 DIAGNOSIS — G8929 Other chronic pain: Secondary | ICD-10-CM | POA: Diagnosis not present

## 2022-05-28 DIAGNOSIS — R0902 Hypoxemia: Secondary | ICD-10-CM | POA: Diagnosis not present

## 2022-05-28 DIAGNOSIS — S2242XA Multiple fractures of ribs, left side, initial encounter for closed fracture: Secondary | ICD-10-CM | POA: Diagnosis not present

## 2022-05-28 DIAGNOSIS — I10 Essential (primary) hypertension: Secondary | ICD-10-CM | POA: Diagnosis not present

## 2022-05-28 DIAGNOSIS — R296 Repeated falls: Secondary | ICD-10-CM | POA: Diagnosis not present

## 2022-05-28 DIAGNOSIS — K5900 Constipation, unspecified: Secondary | ICD-10-CM | POA: Diagnosis not present

## 2022-05-28 DIAGNOSIS — M6281 Muscle weakness (generalized): Secondary | ICD-10-CM | POA: Diagnosis not present

## 2022-05-28 DIAGNOSIS — S2243XA Multiple fractures of ribs, bilateral, initial encounter for closed fracture: Secondary | ICD-10-CM | POA: Diagnosis not present

## 2022-05-28 DIAGNOSIS — B009 Herpesviral infection, unspecified: Secondary | ICD-10-CM | POA: Diagnosis not present

## 2022-05-28 DIAGNOSIS — Z7401 Bed confinement status: Secondary | ICD-10-CM | POA: Diagnosis not present

## 2022-05-28 DIAGNOSIS — H3530 Unspecified macular degeneration: Secondary | ICD-10-CM | POA: Diagnosis not present

## 2022-05-28 DIAGNOSIS — M519 Unspecified thoracic, thoracolumbar and lumbosacral intervertebral disc disorder: Secondary | ICD-10-CM | POA: Diagnosis not present

## 2022-05-28 DIAGNOSIS — D519 Vitamin B12 deficiency anemia, unspecified: Secondary | ICD-10-CM | POA: Diagnosis not present

## 2022-05-28 DIAGNOSIS — S2242XD Multiple fractures of ribs, left side, subsequent encounter for fracture with routine healing: Secondary | ICD-10-CM | POA: Diagnosis not present

## 2022-05-28 DIAGNOSIS — S32000A Wedge compression fracture of unspecified lumbar vertebra, initial encounter for closed fracture: Secondary | ICD-10-CM | POA: Diagnosis not present

## 2022-05-28 DIAGNOSIS — R269 Unspecified abnormalities of gait and mobility: Secondary | ICD-10-CM | POA: Diagnosis not present

## 2022-05-28 DIAGNOSIS — F411 Generalized anxiety disorder: Secondary | ICD-10-CM | POA: Diagnosis not present

## 2022-05-28 DIAGNOSIS — M48061 Spinal stenosis, lumbar region without neurogenic claudication: Secondary | ICD-10-CM | POA: Diagnosis not present

## 2022-05-28 MED ORDER — POLYETHYLENE GLYCOL 3350 17 G PO PACK
17.0000 g | PACK | Freq: Two times a day (BID) | ORAL | Status: DC
  Administered 2022-05-28: 17 g via ORAL
  Filled 2022-05-28: qty 1

## 2022-05-28 MED ORDER — METHOCARBAMOL 500 MG PO TABS: 500.0000 mg | ORAL_TABLET | Freq: Four times a day (QID) | ORAL | Status: DC | PRN

## 2022-05-28 MED ORDER — POLYETHYLENE GLYCOL 3350 17 G PO PACK: 17.0000 g | PACK | Freq: Two times a day (BID) | ORAL | 0 refills | Status: DC

## 2022-05-28 NOTE — Progress Notes (Addendum)
Patient's insurance authorization has been approved - next review date 05/30/22.  CSW spoke with Wille Celeste at [facility] to inform her of information.  Patient can arrive at the facility at 2pm - RN to call PTAR. The number to call for report is [phone]. Room number is 3250.  Edwin Dada, MSW, LCSW Transitions of Care  Clinical Social Worker II [phone]

## 2022-05-28 NOTE — Progress Notes (Signed)
Report called to RN, belongings packed. Daughter called and updated of Pts departure.

## 2022-05-28 NOTE — Care Management Important Message (Signed)
Important Message  Patient Details  Name: [name] MRN: [id number] Date of Birth: 18-Feb-1941   Medicare Important Message Given:  Yes     Dorena Bodo 05/28/2022, 3:29 PM

## 2022-05-28 NOTE — Discharge Summary (Signed)
[facility]  Physician Discharge Summary   Patient ID: [name] MRN: [id number] DOB/AGE: 82/02/1941 82 y.o.  Admit date: 05/22/2022 Discharge date:   05/28/2022   PCP: Thana Atesaju, Sneha P, MD  DISCHARGE DIAGNOSES:    Recurrent falls Degenerative disc disease Cervical spinal stenosis   Anxiety and depression   History of herpes simplex infection   Exudative age-related macular degeneration of right eye with inactive choroidal neovascularization (HCC)   Class 1 obesity due to excess calories with body mass index (BMI) of 30.0 to 30.9 in adult Vitamin B12 deficiency   RECOMMENDATIONS FOR OUTPATIENT FOLLOW UP: Check CBC and basic metabolic panel in 1 week   Home Health: Going to SNF Equipment/Devices: None  CODE STATUS: Full code  DISCHARGE CONDITION: fair  Diet recommendation: Regular as tolerated  INITIAL HISTORY:  81 y.o. female with medical history significant of anxiety and neck pain presenting with a fall.   She reports that in 2021 she had an Achilles rupture; she went to podiatry Karren Burly(Andrew Petery, DPM) and he reportedly told her that she was too chronically ill for surgical repair.  Instead, he sent her to PT and had her wear a boot and it eventually healed.  However, in the intervening 2 years she has become increasingly weak and needs assistive devices to get around even her house (mostly uses a cane for convenience).  Meanwhile, her neck is also weak and she has been to see Dr. Shon BatonBrooks who reportedly told her she has degenerative disease of the cervical and lumbar spine and referred her to pain management for injection because he agreed that she is not a surgical candidate.  She has had a number of recent falls from her legs giving out.  This happened the night prior to admission and she had significant left-sided rib pain as a result of fractures.    Consultants: Dr. Franky Machoabbell with neurosurgery   Procedures: None   HOSPITAL COURSE:    Back pain/neck  pain/multiple compression fractures Patient with chronic history of neck and back pain but worse over the past few days due to recurrent falls.  She also mention lower extremity weakness. CT cervical spine did not show any acute fractures.  She had a CT scan of the chest abdomen and pelvis which showed compression fractures of T8, T12 and L4 1 although this was age-indeterminate. Patient does have weakness in bilateral lower extremities.  Her orthopedic doctors office was called, Dr. Shon BatonBrooks.  No recent MRIs have been done on this patient in their office. Based on care everywhere looks like she has had an MRI Lumbar spine in May of this year which showed mild progression of multilevel degenerative disease.  There was no mention of any compression fractures at that time. Patient subsequently underwent MRI of the cervical thoracic and lumbar spine.  Multiple disc disease is noted.  Multiple compression fractures though most of them were thought to be subacute to chronic.  Spinal stenosis noted but no significant cord involvement. Patient seen by neurosurgery.  No role for surgical intervention at this time.   Seen by physical therapy.  SNF is recommended.  Pain is reasonably well-controlled.   Patient seems to be stable for the most part. Bowel regimen has been ordered.   Rib fracture Patient noted to have fractures involving the left side.  No pneumothorax was noted. Incentive spirometry.  Pain control.  Repeat x-ray does not show any pneumothorax.   Recurrent falls See above for details.    Vitamin  B12 deficiency Vitamin B12 level noted to be low at 265.  Folic acid level is 8.8.  Patient started on B12 supplementation.   Mood disorder Continue home medications.   Macular degeneration Stable.   Obesity Estimated body mass index is 30.41 kg/m as calculated from the following:   Height as of this encounter:  (1.727 m).   Weight as of this encounter: 90.7 kg.   Patient is stable.   Okay for discharge to SNF.   PERTINENT LABS:  The results of significant diagnostics from this hospitalization (including imaging, microbiology, ancillary and laboratory) are listed below for reference.     Labs:   Basic Metabolic Panel: Recent Labs  Lab 05/23/22 1137 05/23/22 1144 05/24/22 0455 05/26/22 0331  NA 141 141 138 139  K 3.6 3.7 3.8 3.7  CL 103 103 104 102  CO2 29  --  26 27  GLUCOSE 89 89 114* 104*  BUN CREATININE 0.61 0.50 0.62 0.60  CALCIUM 9.6  --  9.0 8.6*  MG  --   --   --  2.2    CBC: Recent Labs  Lab 05/23/22 1137 05/23/22 1144 05/24/22 0455  WBC 7.9  --  7.0  NEUTROABS 5.4  --   --   HGB 14.3 15.3* 13.0  HCT 45.2 45.0 40.6  MCV 89.3  --  89.0  PLT 187  --  164     IMAGING STUDIES DG CHEST PORT 1 VIEW  Result Date: 05/25/2022 CLINICAL DATA:  Rib fractures EXAM: PORTABLE CHEST 1 VIEW COMPARISON:  Chest radiograph 05/23/2022 FINDINGS: Stable cardiac and mediastinal contours. Low lung volumes. Aortic atherosclerosis. Bibasilar heterogeneous pulmonary opacities. Known left rib fractures are not well demonstrated on portable chest radiograph. Gaseous distended loops of bowel within the left upper quadrant. IMPRESSION: 1. Low lung volumes with basilar atelectasis. 2. Known left rib fractures are not well demonstrated on portable chest radiograph. Electronically Signed   By: Annia Belt M.D.   On: 05/25/2022 10:55   MR CERVICAL SPINE WO CONTRAST  Result Date: 05/24/2022 CLINICAL DATA:  Myelopathy EXAM: MRI CERVICAL, THORACIC AND LUMBAR SPINE WITHOUT CONTRAST TECHNIQUE: Multiplanar and multiecho pulse sequences of the cervical spine, to include the craniocervical junction and cervicothoracic junction, and thoracic and lumbar spine, were obtained without intravenous contrast. COMPARISON:  MRI lumbar spine 10/27/2011, CT cervical spine 05/24/2022, correlation is also made with CT chest abdomen pelvis 05/23/2022 FINDINGS: Evaluation is somewhat  limited by motion artifact. MRI CERVICAL SPINE FINDINGS Alignment: Trace anterolisthesis of C6 on C7 Vertebrae: No fracture, evidence of discitis, or bone lesion. Congenitally short pedicles, which narrow the AP diameter of the spinal canal. Cord: Normal cord signal. Multilevel cord flattening throughout the cervical spine. Posterior Fossa, vertebral arteries, paraspinal tissues: Negative. Disc levels: C2-C3: Minimal disc bulge. Facet arthropathy. Ligamentum flavum hypertrophy. Mild spinal canal stenosis. No neural foraminal narrowing. C3-C4: Mild disc bulge with central protrusion. Facet and uncovertebral hypertrophy. Ligamentum flavum hypertrophy. The disc bulge indents and deforms the ventral spinal cord, with cord flattening. Moderate to severe spinal canal stenosis. Moderate right neural foraminal narrowing. C4-C5: Disc height loss with disc bulge, which causes cord flattening. Facet and uncovertebral hypertrophy. Ligamentum flavum hypertrophy. Severe spinal canal stenosis. Mild bilateral neural foraminal narrowing. C5-C6: Disc height loss with mild disc bulge, which causes cord flattening. Facet and uncovertebral hypertrophy. Ligamentum flavum hypertrophy. Moderate to severe spinal canal stenosis. Mild right neural foraminal narrowing. C6-C7: Disc bulge with superimposed central protrusion  which indents and deforms the spinal cord. Facet and uncovertebral hypertrophy. Ligamentum flavum hypertrophy. Severe spinal canal stenosis. Moderate right neural foraminal narrowing. C7-T1: Mild disc bulge. Facet and uncovertebral hypertrophy. No spinal canal stenosis. Mild right neural foraminal narrowing. MRI THORACIC SPINE FINDINGS Alignment: Trace anterolisthesis of T2 on T3. Exaggeration of the normal thoracic kyphosis secondary to wedging midthoracic vertebral bodies S shaped curvature of the thoracolumbar spine, with levocurvature of the mid thoracic spine. Vertebrae: No suspicious osseous lesion. Acute to subacute  compression T12, with approximately 20% vertebral body height loss. Subacute compression fracture of T11 with 10% vertebral body height loss. No significant retropulsion. Wedging of T5, T6, T7, and T8, without associated edema, likely remote compression fractures. Cord:  Normal signal and morphology. Paraspinal and other soft tissues: Trace bilateral pleural effusions with associated atelectasis. Right hepatic lobe cysts, for which no follow-up is currently indicated. Disc levels: No significant spinal canal stenosis or neural foraminal narrowing. MRI LUMBAR SPINE FINDINGS Segmentation:  5 lumbar type vertebral bodies. Alignment: Mild dextrocurvature of the lumbar spine. 3 mm anterolisthesis of T12 on L1 and 6 mm anterolisthesis of L4 on L5. Vertebrae: Subacute to chronic compression fracture of L1, with approximately 50% vertebral body height loss. No significant retropulsion. No acute fracture or suspicious osseous lesion. Conus medullaris and cauda equina: Conus extends to the L1-L2 level. Conus and cauda equina appear normal. Paraspinal and other soft tissues: Negative. Disc levels: T12-L1: Trace anterolisthesis with disc unroofing. Mild facet arthropathy. No spinal canal stenosis or neural foraminal narrowing. L1-L2: Mild disc bulge. Mild facet arthropathy. No spinal canal stenosis or neural foraminal narrowing. L2-L3: Mild disc bulge. Mild facet arthropathy. No spinal canal stenosis or neural foraminal narrowing. L3-L4: Mild disc bulge. Moderate facet arthropathy. Narrowing of the lateral recesses. No spinal canal stenosis or neural foraminal narrowing. L4-L5: Grade 1 anterolisthesis with disc unroofing. Moderate facet arthropathy. Narrowing of the lateral recesses. No spinal canal stenosis or neural foraminal narrowing. L5-S1: Mild disc bulge. Moderate facet arthropathy. No spinal canal stenosis or neural foraminal narrowing. IMPRESSION: 1. Evaluation is somewhat limited by motion artifact. Within this  limitation, there is an acute to subacute compression fracture of T12, with approximately 20% vertebral body height loss, and a subacute compression fracture of T11, with approximately 10% vertebral body loss. No retropulsion or associated spinal canal stenosis. 2. Congenitally short pedicles and multilevel degenerative changes in the cervical spine, which cause severe spinal canal stenosis at C4-C5 and C6-C7 and moderate to severe spinal canal stenosis at C3-C4 and C5-C6. Multilevel cord flattening without cord signal abnormality. 3. Multilevel neural foraminal narrowing, which is moderate on the right at C3-C4 and C6-C7, mild bilaterally at C4-C5, and mild on the right at C5-C6 and C7-T1. 4. No significant spinal canal stenosis or neural foraminal narrowing in the thoracic or lumbar spine. 5. Narrowing of the lateral recesses at L3-L4 and L4-L5 could affect the descending L4 and L5 nerve roots, respectively. 6. Trace bilateral pleural effusions with associated atelectasis. Electronically Signed   By: Wiliam Ke M.D.   On: 05/24/2022 13:02   MR THORACIC SPINE WO CONTRAST  Result Date: 05/24/2022 CLINICAL DATA:  Myelopathy EXAM: MRI CERVICAL, THORACIC AND LUMBAR SPINE WITHOUT CONTRAST TECHNIQUE: Multiplanar and multiecho pulse sequences of the cervical spine, to include the craniocervical junction and cervicothoracic junction, and thoracic and lumbar spine, were obtained without intravenous contrast. COMPARISON:  MRI lumbar spine 10/27/2011, CT cervical spine 05/24/2022, correlation is also made with CT chest abdomen pelvis 05/23/2022 FINDINGS:  Evaluation is somewhat limited by motion artifact. MRI CERVICAL SPINE FINDINGS Alignment: Trace anterolisthesis of C6 on C7 Vertebrae: No fracture, evidence of discitis, or bone lesion. Congenitally short pedicles, which narrow the AP diameter of the spinal canal. Cord: Normal cord signal. Multilevel cord flattening throughout the cervical spine. Posterior Fossa,  vertebral arteries, paraspinal tissues: Negative. Disc levels: C2-C3: Minimal disc bulge. Facet arthropathy. Ligamentum flavum hypertrophy. Mild spinal canal stenosis. No neural foraminal narrowing. C3-C4: Mild disc bulge with central protrusion. Facet and uncovertebral hypertrophy. Ligamentum flavum hypertrophy. The disc bulge indents and deforms the ventral spinal cord, with cord flattening. Moderate to severe spinal canal stenosis. Moderate right neural foraminal narrowing. C4-C5: Disc height loss with disc bulge, which causes cord flattening. Facet and uncovertebral hypertrophy. Ligamentum flavum hypertrophy. Severe spinal canal stenosis. Mild bilateral neural foraminal narrowing. C5-C6: Disc height loss with mild disc bulge, which causes cord flattening. Facet and uncovertebral hypertrophy. Ligamentum flavum hypertrophy. Moderate to severe spinal canal stenosis. Mild right neural foraminal narrowing. C6-C7: Disc bulge with superimposed central protrusion which indents and deforms the spinal cord. Facet and uncovertebral hypertrophy. Ligamentum flavum hypertrophy. Severe spinal canal stenosis. Moderate right neural foraminal narrowing. C7-T1: Mild disc bulge. Facet and uncovertebral hypertrophy. No spinal canal stenosis. Mild right neural foraminal narrowing. MRI THORACIC SPINE FINDINGS Alignment: Trace anterolisthesis of T2 on T3. Exaggeration of the normal thoracic kyphosis secondary to wedging midthoracic vertebral bodies S shaped curvature of the thoracolumbar spine, with levocurvature of the mid thoracic spine. Vertebrae: No suspicious osseous lesion. Acute to subacute compression T12, with approximately 20% vertebral body height loss. Subacute compression fracture of T11 with 10% vertebral body height loss. No significant retropulsion. Wedging of T5, T6, T7, and T8, without associated edema, likely remote compression fractures. Cord:  Normal signal and morphology. Paraspinal and other soft tissues: Trace  bilateral pleural effusions with associated atelectasis. Right hepatic lobe cysts, for which no follow-up is currently indicated. Disc levels: No significant spinal canal stenosis or neural foraminal narrowing. MRI LUMBAR SPINE FINDINGS Segmentation:  5 lumbar type vertebral bodies. Alignment: Mild dextrocurvature of the lumbar spine. 3 mm anterolisthesis of T12 on L1 and 6 mm anterolisthesis of L4 on L5. Vertebrae: Subacute to chronic compression fracture of L1, with approximately 50% vertebral body height loss. No significant retropulsion. No acute fracture or suspicious osseous lesion. Conus medullaris and cauda equina: Conus extends to the L1-L2 level. Conus and cauda equina appear normal. Paraspinal and other soft tissues: Negative. Disc levels: T12-L1: Trace anterolisthesis with disc unroofing. Mild facet arthropathy. No spinal canal stenosis or neural foraminal narrowing. L1-L2: Mild disc bulge. Mild facet arthropathy. No spinal canal stenosis or neural foraminal narrowing. L2-L3: Mild disc bulge. Mild facet arthropathy. No spinal canal stenosis or neural foraminal narrowing. L3-L4: Mild disc bulge. Moderate facet arthropathy. Narrowing of the lateral recesses. No spinal canal stenosis or neural foraminal narrowing. L4-L5: Grade 1 anterolisthesis with disc unroofing. Moderate facet arthropathy. Narrowing of the lateral recesses. No spinal canal stenosis or neural foraminal narrowing. L5-S1: Mild disc bulge. Moderate facet arthropathy. No spinal canal stenosis or neural foraminal narrowing. IMPRESSION: 1. Evaluation is somewhat limited by motion artifact. Within this limitation, there is an acute to subacute compression fracture of T12, with approximately 20% vertebral body height loss, and a subacute compression fracture of T11, with approximately 10% vertebral body loss. No retropulsion or associated spinal canal stenosis. 2. Congenitally short pedicles and multilevel degenerative changes in the cervical  spine, which cause severe spinal canal stenosis at C4-C5  and C6-C7 and moderate to severe spinal canal stenosis at C3-C4 and C5-C6. Multilevel cord flattening without cord signal abnormality. 3. Multilevel neural foraminal narrowing, which is moderate on the right at C3-C4 and C6-C7, mild bilaterally at C4-C5, and mild on the right at C5-C6 and C7-T1. 4. No significant spinal canal stenosis or neural foraminal narrowing in the thoracic or lumbar spine. 5. Narrowing of the lateral recesses at L3-L4 and L4-L5 could affect the descending L4 and L5 nerve roots, respectively. 6. Trace bilateral pleural effusions with associated atelectasis. Electronically Signed   By: Wiliam Ke M.D.   On: 05/24/2022 13:02   MR LUMBAR SPINE WO CONTRAST  Result Date: 05/24/2022 CLINICAL DATA:  Myelopathy EXAM: MRI CERVICAL, THORACIC AND LUMBAR SPINE WITHOUT CONTRAST TECHNIQUE: Multiplanar and multiecho pulse sequences of the cervical spine, to include the craniocervical junction and cervicothoracic junction, and thoracic and lumbar spine, were obtained without intravenous contrast. COMPARISON:  MRI lumbar spine 10/27/2011, CT cervical spine 05/24/2022, correlation is also made with CT chest abdomen pelvis 05/23/2022 FINDINGS: Evaluation is somewhat limited by motion artifact. MRI CERVICAL SPINE FINDINGS Alignment: Trace anterolisthesis of C6 on C7 Vertebrae: No fracture, evidence of discitis, or bone lesion. Congenitally short pedicles, which narrow the AP diameter of the spinal canal. Cord: Normal cord signal. Multilevel cord flattening throughout the cervical spine. Posterior Fossa, vertebral arteries, paraspinal tissues: Negative. Disc levels: C2-C3: Minimal disc bulge. Facet arthropathy. Ligamentum flavum hypertrophy. Mild spinal canal stenosis. No neural foraminal narrowing. C3-C4: Mild disc bulge with central protrusion. Facet and uncovertebral hypertrophy. Ligamentum flavum hypertrophy. The disc bulge indents and deforms the  ventral spinal cord, with cord flattening. Moderate to severe spinal canal stenosis. Moderate right neural foraminal narrowing. C4-C5: Disc height loss with disc bulge, which causes cord flattening. Facet and uncovertebral hypertrophy. Ligamentum flavum hypertrophy. Severe spinal canal stenosis. Mild bilateral neural foraminal narrowing. C5-C6: Disc height loss with mild disc bulge, which causes cord flattening. Facet and uncovertebral hypertrophy. Ligamentum flavum hypertrophy. Moderate to severe spinal canal stenosis. Mild right neural foraminal narrowing. C6-C7: Disc bulge with superimposed central protrusion which indents and deforms the spinal cord. Facet and uncovertebral hypertrophy. Ligamentum flavum hypertrophy. Severe spinal canal stenosis. Moderate right neural foraminal narrowing. C7-T1: Mild disc bulge. Facet and uncovertebral hypertrophy. No spinal canal stenosis. Mild right neural foraminal narrowing. MRI THORACIC SPINE FINDINGS Alignment: Trace anterolisthesis of T2 on T3. Exaggeration of the normal thoracic kyphosis secondary to wedging midthoracic vertebral bodies S shaped curvature of the thoracolumbar spine, with levocurvature of the mid thoracic spine. Vertebrae: No suspicious osseous lesion. Acute to subacute compression T12, with approximately 20% vertebral body height loss. Subacute compression fracture of T11 with 10% vertebral body height loss. No significant retropulsion. Wedging of T5, T6, T7, and T8, without associated edema, likely remote compression fractures. Cord:  Normal signal and morphology. Paraspinal and other soft tissues: Trace bilateral pleural effusions with associated atelectasis. Right hepatic lobe cysts, for which no follow-up is currently indicated. Disc levels: No significant spinal canal stenosis or neural foraminal narrowing. MRI LUMBAR SPINE FINDINGS Segmentation:  5 lumbar type vertebral bodies. Alignment: Mild dextrocurvature of the lumbar spine. 3 mm  anterolisthesis of T12 on L1 and 6 mm anterolisthesis of L4 on L5. Vertebrae: Subacute to chronic compression fracture of L1, with approximately 50% vertebral body height loss. No significant retropulsion. No acute fracture or suspicious osseous lesion. Conus medullaris and cauda equina: Conus extends to the L1-L2 level. Conus and cauda equina appear normal. Paraspinal and other soft  tissues: Negative. Disc levels: T12-L1: Trace anterolisthesis with disc unroofing. Mild facet arthropathy. No spinal canal stenosis or neural foraminal narrowing. L1-L2: Mild disc bulge. Mild facet arthropathy. No spinal canal stenosis or neural foraminal narrowing. L2-L3: Mild disc bulge. Mild facet arthropathy. No spinal canal stenosis or neural foraminal narrowing. L3-L4: Mild disc bulge. Moderate facet arthropathy. Narrowing of the lateral recesses. No spinal canal stenosis or neural foraminal narrowing. L4-L5: Grade 1 anterolisthesis with disc unroofing. Moderate facet arthropathy. Narrowing of the lateral recesses. No spinal canal stenosis or neural foraminal narrowing. L5-S1: Mild disc bulge. Moderate facet arthropathy. No spinal canal stenosis or neural foraminal narrowing. IMPRESSION: 1. Evaluation is somewhat limited by motion artifact. Within this limitation, there is an acute to subacute compression fracture of T12, with approximately 20% vertebral body height loss, and a subacute compression fracture of T11, with approximately 10% vertebral body loss. No retropulsion or associated spinal canal stenosis. 2. Congenitally short pedicles and multilevel degenerative changes in the cervical spine, which cause severe spinal canal stenosis at C4-C5 and C6-C7 and moderate to severe spinal canal stenosis at C3-C4 and C5-C6. Multilevel cord flattening without cord signal abnormality. 3. Multilevel neural foraminal narrowing, which is moderate on the right at C3-C4 and C6-C7, mild bilaterally at C4-C5, and mild on the right at C5-C6  and C7-T1. 4. No significant spinal canal stenosis or neural foraminal narrowing in the thoracic or lumbar spine. 5. Narrowing of the lateral recesses at L3-L4 and L4-L5 could affect the descending L4 and L5 nerve roots, respectively. 6. Trace bilateral pleural effusions with associated atelectasis. Electronically Signed   By: Wiliam Ke M.D.   On: 05/24/2022 13:02   CT CHEST ABDOMEN PELVIS W CONTRAST  Result Date: 05/23/2022 CLINICAL DATA:  Status post fall. EXAM: CT CHEST, ABDOMEN, AND PELVIS WITH CONTRAST TECHNIQUE: Multidetector CT imaging of the chest, abdomen and pelvis was performed following the standard protocol during bolus administration of intravenous contrast. RADIATION DOSE REDUCTION: This exam was performed according to the departmental dose-optimization program which includes automated exposure control, adjustment of the mA and/or kV according to patient size and/or use of iterative reconstruction technique. CONTRAST:  53mL OMNIPAQUE IOHEXOL 350 MG/ML SOLN COMPARISON:  None Available. FINDINGS: CT CHEST FINDINGS Cardiovascular: Heart size is mildly enlarged. No pericardial effusion. Aortic atherosclerosis and coronary artery calcifications. Mediastinum/Nodes: No enlarged mediastinal, hilar, or axillary lymph nodes. Thyroid gland, trachea, and esophagus demonstrate no significant findings. Lungs/Pleura: Mild centrilobular and paraseptal emphysema with diffuse bronchial wall thickening. No pleural fluid. Subpleural consolidation is noted overlying bilateral lung bases along with areas of subsegmental atelectasis and volume loss. No suspicious pulmonary nodule or mass. Musculoskeletal: Fractures are identified involving the lateral aspect of the left forth, sixth, seventh, and eighth ribs. There is also a fracture involving the posterior aspect of the left eleventh rib. There are age indeterminate compression deformities involving the T8, T12, and L1 vertebra. Most advanced at the L1 level where  there is greater than 50% loss of the vertebral body height. Schmorl's node deformity involves the superior endplate of T11. CT ABDOMEN PELVIS FINDINGS Hepatobiliary: No hepatic injury or perihepatic hematoma. Cyst within posterior right lobe of liver measures 1.2 cm. Left lobe of liver cyst measures 1.1 cm. Several, subcentimeter, too small to characterize low-density liver lesions are also noted. Pancreas: Unremarkable. No pancreatic ductal dilatation or surrounding inflammatory changes. Spleen: No splenic injury or perisplenic hematoma. Adrenals/Urinary Tract: Normal adrenal glands. No nephrolithiasis, hydronephrosis or suspicious mass. Urinary bladder is unremarkable. Stomach/Bowel: Stomach  is within normal limits. No evidence of bowel wall thickening, distention, or inflammatory changes. Vascular/Lymphatic: Aortic atherosclerosis. No abdominopelvic adenopathy. Reproductive: Pessary device is identified within the vaginal apex. Uterus and adnexal structures are unremarkable. Other: No free fluid or fluid collections. Musculoskeletal: Age-indeterminate fracture deformity involving the L1 vertebra is noted with loss of approximately 60% of the vertebral body height. Anterolisthesis of L4 on L5 measures 6 mm. Moderate degenerative disc disease with vacuum disc noted at L4-5. IMPRESSION: 1. Acute fractures involving the lateral aspect of the left forth, sixth, seventh, and eighth ribs. There is also a fracture involving the posterior aspect of the left eleventh rib. 2. Bilateral subpleural consolidation and atelectasis identified within both lung bases. No pleural fluid noted. 3. Age-indeterminate compression deformities involving the T8, T12, and L1 vertebra. Most severe at the L1 level where there is greater than 50% loss of the vertebral body height. 4. No signs of solid organ injury within the chest, abdomen or pelvis. 5. Aortic Atherosclerosis (ICD10-I70.0) and Emphysema (ICD10-J43.9). Electronically Signed    By: Signa Kell M.D.   On: 05/23/2022 16:15   DG Pelvis 1-2 Views  Result Date: 05/23/2022 CLINICAL DATA:  Status post fall. EXAM: PELVIS - 1-2 VIEW COMPARISON:  None Available. FINDINGS: The bones appear mildly demineralized. There is no evidence of acute fracture or dislocation. Mild symmetric degenerative changes at both hips and sacroiliac joints. The soft tissues appear unremarkable. IMPRESSION: No evidence of acute fracture or dislocation. Mild symmetric degenerative changes. Electronically Signed   By: Carey Bullocks M.D.   On: 05/23/2022 09:41   DG Ribs Unilateral W/Chest Left  Result Date: 05/23/2022 CLINICAL DATA:  Weakness.  Fall.  Left chest pain. EXAM: LEFT RIBS AND CHEST - 3+ VIEW COMPARISON:  Chest radiographs 01/19/2013 and 05/19/2009. FINDINGS: The heart size is at the upper limits of normal. There is aortic atherosclerosis and mild vascular congestion. Small left pleural effusion and mild bibasilar atelectasis. No evidence of pneumothorax. There are at least 3 acute rib fractures on the left involving the 6th, 7th and 8th ribs anterolaterally. The 7th rib fracture is mildly displaced. Thoracolumbar spondylosis noted. Exclusion of mild thoracic compression deformities is difficult by this examination. If the patient has focal back pain, dedicated thoracic radiographs should be considered. IMPRESSION: 1. At least 3 acute left-sided rib fractures as described with small left pleural effusion. No evidence of pneumothorax. 2. Borderline heart size with vascular congestion. 3. Potential thoracic compression deformities, suboptimally assessed on these views. Dedicated thoracic radiographs should be considered if clinically warranted. Electronically Signed   By: Carey Bullocks M.D.   On: 05/23/2022 09:40   CT Head Wo Contrast  Result Date: 05/22/2022 CLINICAL DATA:  Head trauma, moderate-severe; Neck trauma (Age >= 65y) EXAM: CT HEAD WITHOUT CONTRAST CT CERVICAL SPINE WITHOUT CONTRAST  TECHNIQUE: Multidetector CT imaging of the head and cervical spine was performed following the standard protocol without intravenous contrast. Multiplanar CT image reconstructions of the cervical spine were also generated. RADIATION DOSE REDUCTION: This exam was performed according to the departmental dose-optimization program which includes automated exposure control, adjustment of the mA and/or kV according to patient size and/or use of iterative reconstruction technique. COMPARISON:  CT head 05/24/2021 FINDINGS: CT HEAD FINDINGS BRAIN: BRAIN Cerebral ventricle sizes are concordant with the degree of cerebral volume loss. Patchy and confluent areas of decreased attenuation are noted throughout the deep and periventricular white matter of the cerebral hemispheres bilaterally, compatible with chronic microvascular ischemic disease. No evidence of  large-territorial acute infarction. No parenchymal hemorrhage. No mass lesion. No extra-axial collection. No mass effect or midline shift. No hydrocephalus. Basilar cisterns are patent. Vascular: No hyperdense vessel. Atherosclerotic calcifications are present within the cavernous internal carotid arteries. Skull: No acute fracture or focal lesion. Mild bilateral temporomandibular joint degenerative changes. Sinuses/Orbits: Paranasal sinuses and mastoid air cells are clear. Right lens replacement. Otherwise the orbits are unremarkable. Other: None. CT CERVICAL SPINE FINDINGS Alignment: Normal. Skull base and vertebrae: Multilevel moderate severe degenerative changes of the spine. No associated severe osseous neural foraminal or central canal stenosis. No acute fracture. No aggressive appearing focal osseous lesion or focal pathologic process. Soft tissues and spinal canal: No prevertebral fluid or swelling. No visible canal hematoma. Upper chest: Mild emphysematous changes. Other: None. IMPRESSION: 1. No acute intracranial abnormality. 2. No acute displaced fracture or  traumatic listhesis of the cervical spine. 3.  Emphysema (ICD10-J43.9). Electronically Signed   By: Tish Frederickson M.D.   On: 05/22/2022 23:52   CT Cervical Spine Wo Contrast  Result Date: 05/22/2022 CLINICAL DATA:  Head trauma, moderate-severe; Neck trauma (Age >= 65y) EXAM: CT HEAD WITHOUT CONTRAST CT CERVICAL SPINE WITHOUT CONTRAST TECHNIQUE: Multidetector CT imaging of the head and cervical spine was performed following the standard protocol without intravenous contrast. Multiplanar CT image reconstructions of the cervical spine were also generated. RADIATION DOSE REDUCTION: This exam was performed according to the departmental dose-optimization program which includes automated exposure control, adjustment of the mA and/or kV according to patient size and/or use of iterative reconstruction technique. COMPARISON:  CT head 05/24/2021 FINDINGS: CT HEAD FINDINGS BRAIN: BRAIN Cerebral ventricle sizes are concordant with the degree of cerebral volume loss. Patchy and confluent areas of decreased attenuation are noted throughout the deep and periventricular white matter of the cerebral hemispheres bilaterally, compatible with chronic microvascular ischemic disease. No evidence of large-territorial acute infarction. No parenchymal hemorrhage. No mass lesion. No extra-axial collection. No mass effect or midline shift. No hydrocephalus. Basilar cisterns are patent. Vascular: No hyperdense vessel. Atherosclerotic calcifications are present within the cavernous internal carotid arteries. Skull: No acute fracture or focal lesion. Mild bilateral temporomandibular joint degenerative changes. Sinuses/Orbits: Paranasal sinuses and mastoid air cells are clear. Right lens replacement. Otherwise the orbits are unremarkable. Other: None. CT CERVICAL SPINE FINDINGS Alignment: Normal. Skull base and vertebrae: Multilevel moderate severe degenerative changes of the spine. No associated severe osseous neural foraminal or central  canal stenosis. No acute fracture. No aggressive appearing focal osseous lesion or focal pathologic process. Soft tissues and spinal canal: No prevertebral fluid or swelling. No visible canal hematoma. Upper chest: Mild emphysematous changes. Other: None. IMPRESSION: 1. No acute intracranial abnormality. 2. No acute displaced fracture or traumatic listhesis of the cervical spine. 3.  Emphysema (ICD10-J43.9). Electronically Signed   By: Tish Frederickson M.D.   On: 05/22/2022 23:52    DISCHARGE EXAMINATION: Vitals:   05/27/22 1654 05/27/22 2038 05/28/22 0454 05/28/22 0730  BP: (!) 158/61 (!) 149/75 (!) 148/78 (!) 145/59  Pulse: 78 74 75 79  Resp: Temp: 98.7 F (37.1 C) 97.9 F (36.6 C) 97.8 F (36.6 C) 98.1 F (36.7 C)  TempSrc: Oral Oral Oral Oral  SpO2: 90% 95% 96% 90%  Weight:      Height:       General appearance: Awake alert.  In no distress Resp: Clear to auscultation bilaterally.  Normal effort Cardio: S1-S2 is normal regular.  No S3-S4.  No rubs murmurs or bruit GI:  Abdomen is soft.  Nontender nondistended.  Bowel sounds are present normal.  No masses organomegaly   DISPOSITION: SNF  Discharge Instructions     Call MD for:  difficulty breathing, headache or visual disturbances   Complete by: As directed    Call MD for:  extreme fatigue   Complete by: As directed    Call MD for:  persistant dizziness or light-headedness   Complete by: As directed    Call MD for:  persistant nausea and vomiting   Complete by: As directed    Call MD for:  severe uncontrolled pain   Complete by: As directed    Call MD for:  temperature >100.4   Complete by: As directed    Diet general   Complete by: As directed    Discharge instructions   Complete by: As directed    Please review instructions on the discharge summary  You were cared for by a hospitalist during your hospital stay. If you have any questions about your discharge medications or the care you received while you  were in the hospital after you are discharged, you can call the unit and asked to speak with the hospitalist on call if the hospitalist that took care of you is not available. Once you are discharged, your primary care physician will handle any further medical issues. Please note that NO REFILLS for any discharge medications will be authorized once you are discharged, as it is imperative that you return to your primary care physician (or establish a relationship with a primary care physician if you do not have one) for your aftercare needs so that they can reassess your need for medications and monitor your lab values. If you do not have a primary care physician, you can call [phone] for a physician referral.   Increase activity slowly   Complete by: As directed           Allergies as of 05/28/2022   No Known Allergies      Medication List     TAKE these medications    acetaminophen 325 MG tablet Commonly known as: TYLENOL Take 2 tablets (650 mg total) by mouth every 6 (six) hours as needed for mild pain (or Fever >/= 101).   amoxicillin 500 MG capsule Commonly known as: AMOXIL Take 500 mg by mouth See admin instructions. Take one pill before dentist appointment.   Calcium Carbonate-Vitamin D3 600-400 MG-UNIT Tabs Take 1 tablet by mouth daily.   cyanocobalamin 1000 MCG tablet Take 1 tablet (1,000 mcg total) by mouth daily. Start taking on: May 30, 2022   feeding supplement Liqd Take 237 mLs by mouth 2 (two) times daily between meals.   FLUoxetine 20 MG tablet Commonly known as: PROZAC Take 60 mg by mouth daily.   lidocaine 5 % Commonly known as: LIDODERM Place 1 patch onto the skin daily. Remove & Discard patch within 12 hours or as directed by MD   LORazepam 0.5 MG tablet Commonly known as: ATIVAN Take 1 tablet (0.5 mg total) by mouth daily as needed for anxiety or sleep.   methocarbamol 500 MG tablet Commonly known as: ROBAXIN Take 1 tablet (500 mg total)  by mouth every 6 (six) hours as needed for muscle spasms.   oxyCODONE 5 MG immediate release tablet Commonly known as: Oxy IR/ROXICODONE Take 1 tablet (5 mg total) by mouth every 4 (four) hours as needed for severe pain.   polyethylene glycol 17 g packet Commonly known as: MIRALAX / GLYCOLAX  Take 17 g by mouth 2 (two) times daily.   senna-docusate 8.6-50 MG tablet Commonly known as: Senokot-S Take 2 tablets by mouth 2 (two) times daily.   valACYclovir 500 MG tablet Commonly known as: VALTREX TAKE 1 TABLET BY MOUTH EVERY DAY   WOMENS MULTI PO Take 1 tablet by mouth daily.   ZYRTEC PO Take 1 tablet by mouth daily as needed (Allergies).          Contact information for follow-up providers     Thana Ates, MD. Schedule an appointment as soon as possible for a visit.   Specialty: Internal Medicine Why: post hospitalization follow up Contact information: [address] [place] [address] [phone]         Venita Lick, MD Follow up.   Specialty: Orthopedic Surgery Why: As needed Contact information: [address] [place] [address] [phone]              Contact information for after-discharge care     Destination     [facility] .   Service: Skilled Nursing Contact information: [address] [place] [address] [phone]                     TOTAL DISCHARGE TIME: 35 minutes  Inesha Sow Rito Ehrlich  [facility] Pager on [email or web address]  05/28/2022, 9:57 AM

## 2022-05-29 DIAGNOSIS — G8929 Other chronic pain: Secondary | ICD-10-CM | POA: Diagnosis not present

## 2022-05-29 DIAGNOSIS — D519 Vitamin B12 deficiency anemia, unspecified: Secondary | ICD-10-CM | POA: Diagnosis not present

## 2022-05-29 DIAGNOSIS — F411 Generalized anxiety disorder: Secondary | ICD-10-CM | POA: Diagnosis not present

## 2022-05-29 DIAGNOSIS — R296 Repeated falls: Secondary | ICD-10-CM | POA: Diagnosis not present

## 2022-05-29 DIAGNOSIS — B009 Herpesviral infection, unspecified: Secondary | ICD-10-CM | POA: Diagnosis not present

## 2022-05-29 DIAGNOSIS — K5900 Constipation, unspecified: Secondary | ICD-10-CM | POA: Diagnosis not present

## 2022-05-29 DIAGNOSIS — M519 Unspecified thoracic, thoracolumbar and lumbosacral intervertebral disc disorder: Secondary | ICD-10-CM | POA: Diagnosis not present

## 2022-05-29 DIAGNOSIS — H3530 Unspecified macular degeneration: Secondary | ICD-10-CM | POA: Diagnosis not present

## 2022-05-29 DIAGNOSIS — S2242XD Multiple fractures of ribs, left side, subsequent encounter for fracture with routine healing: Secondary | ICD-10-CM | POA: Diagnosis not present

## 2022-06-01 DIAGNOSIS — S2239XA Fracture of one rib, unspecified side, initial encounter for closed fracture: Secondary | ICD-10-CM | POA: Diagnosis not present

## 2022-06-01 DIAGNOSIS — M48061 Spinal stenosis, lumbar region without neurogenic claudication: Secondary | ICD-10-CM | POA: Diagnosis not present

## 2022-06-01 DIAGNOSIS — S32000A Wedge compression fracture of unspecified lumbar vertebra, initial encounter for closed fracture: Secondary | ICD-10-CM | POA: Diagnosis not present

## 2022-06-01 DIAGNOSIS — R269 Unspecified abnormalities of gait and mobility: Secondary | ICD-10-CM | POA: Diagnosis not present

## 2022-06-04 DIAGNOSIS — K5900 Constipation, unspecified: Secondary | ICD-10-CM | POA: Diagnosis not present

## 2022-06-04 DIAGNOSIS — G8929 Other chronic pain: Secondary | ICD-10-CM | POA: Diagnosis not present

## 2022-06-04 DIAGNOSIS — S2242XD Multiple fractures of ribs, left side, subsequent encounter for fracture with routine healing: Secondary | ICD-10-CM | POA: Diagnosis not present

## 2022-06-04 DIAGNOSIS — F411 Generalized anxiety disorder: Secondary | ICD-10-CM | POA: Diagnosis not present

## 2022-06-19 ENCOUNTER — Encounter: Payer: Self-pay | Admitting: Nurse Practitioner

## 2022-06-21 DIAGNOSIS — F32A Depression, unspecified: Secondary | ICD-10-CM | POA: Diagnosis not present

## 2022-06-21 DIAGNOSIS — G8330 Monoplegia, unspecified affecting unspecified side: Secondary | ICD-10-CM | POA: Diagnosis not present

## 2022-06-21 DIAGNOSIS — S2232XD Fracture of one rib, left side, subsequent encounter for fracture with routine healing: Secondary | ICD-10-CM | POA: Diagnosis not present

## 2022-06-21 DIAGNOSIS — I739 Peripheral vascular disease, unspecified: Secondary | ICD-10-CM | POA: Diagnosis not present

## 2022-06-21 DIAGNOSIS — F4323 Adjustment disorder with mixed anxiety and depressed mood: Secondary | ICD-10-CM | POA: Diagnosis not present

## 2022-06-21 DIAGNOSIS — F419 Anxiety disorder, unspecified: Secondary | ICD-10-CM | POA: Diagnosis not present

## 2022-06-21 DIAGNOSIS — S32008D Other fracture of unspecified lumbar vertebra, subsequent encounter for fracture with routine healing: Secondary | ICD-10-CM | POA: Diagnosis not present

## 2022-06-21 DIAGNOSIS — J439 Emphysema, unspecified: Secondary | ICD-10-CM | POA: Diagnosis not present

## 2022-06-21 DIAGNOSIS — S22008D Other fracture of unspecified thoracic vertebra, subsequent encounter for fracture with routine healing: Secondary | ICD-10-CM | POA: Diagnosis not present

## 2022-06-25 DIAGNOSIS — E538 Deficiency of other specified B group vitamins: Secondary | ICD-10-CM | POA: Diagnosis not present

## 2022-06-25 DIAGNOSIS — R350 Frequency of micturition: Secondary | ICD-10-CM | POA: Diagnosis not present

## 2022-06-25 DIAGNOSIS — S32000D Wedge compression fracture of unspecified lumbar vertebra, subsequent encounter for fracture with routine healing: Secondary | ICD-10-CM | POA: Diagnosis not present

## 2022-06-25 DIAGNOSIS — Z79899 Other long term (current) drug therapy: Secondary | ICD-10-CM | POA: Diagnosis not present

## 2022-06-25 DIAGNOSIS — R2689 Other abnormalities of gait and mobility: Secondary | ICD-10-CM | POA: Diagnosis not present

## 2022-06-25 DIAGNOSIS — R296 Repeated falls: Secondary | ICD-10-CM | POA: Diagnosis not present

## 2022-06-25 DIAGNOSIS — M8589 Other specified disorders of bone density and structure, multiple sites: Secondary | ICD-10-CM | POA: Diagnosis not present

## 2022-06-25 DIAGNOSIS — M7989 Other specified soft tissue disorders: Secondary | ICD-10-CM | POA: Diagnosis not present

## 2022-06-25 DIAGNOSIS — S22000D Wedge compression fracture of unspecified thoracic vertebra, subsequent encounter for fracture with routine healing: Secondary | ICD-10-CM | POA: Diagnosis not present

## 2022-06-26 DIAGNOSIS — G8330 Monoplegia, unspecified affecting unspecified side: Secondary | ICD-10-CM | POA: Diagnosis not present

## 2022-06-26 DIAGNOSIS — J439 Emphysema, unspecified: Secondary | ICD-10-CM | POA: Diagnosis not present

## 2022-06-26 DIAGNOSIS — S2232XD Fracture of one rib, left side, subsequent encounter for fracture with routine healing: Secondary | ICD-10-CM | POA: Diagnosis not present

## 2022-06-26 DIAGNOSIS — F419 Anxiety disorder, unspecified: Secondary | ICD-10-CM | POA: Diagnosis not present

## 2022-06-26 DIAGNOSIS — F4323 Adjustment disorder with mixed anxiety and depressed mood: Secondary | ICD-10-CM | POA: Diagnosis not present

## 2022-06-26 DIAGNOSIS — I739 Peripheral vascular disease, unspecified: Secondary | ICD-10-CM | POA: Diagnosis not present

## 2022-06-26 DIAGNOSIS — F32A Depression, unspecified: Secondary | ICD-10-CM | POA: Diagnosis not present

## 2022-06-26 DIAGNOSIS — S22008D Other fracture of unspecified thoracic vertebra, subsequent encounter for fracture with routine healing: Secondary | ICD-10-CM | POA: Diagnosis not present

## 2022-06-26 DIAGNOSIS — S32008D Other fracture of unspecified lumbar vertebra, subsequent encounter for fracture with routine healing: Secondary | ICD-10-CM | POA: Diagnosis not present

## 2022-07-02 DIAGNOSIS — M792 Neuralgia and neuritis, unspecified: Secondary | ICD-10-CM | POA: Diagnosis not present

## 2022-07-02 DIAGNOSIS — M47816 Spondylosis without myelopathy or radiculopathy, lumbar region: Secondary | ICD-10-CM | POA: Diagnosis not present

## 2022-07-02 DIAGNOSIS — M542 Cervicalgia: Secondary | ICD-10-CM | POA: Diagnosis not present

## 2022-07-03 ENCOUNTER — Ambulatory Visit: Payer: Medicare PPO | Admitting: Nurse Practitioner

## 2022-07-04 DIAGNOSIS — J439 Emphysema, unspecified: Secondary | ICD-10-CM | POA: Diagnosis not present

## 2022-07-04 DIAGNOSIS — S2232XD Fracture of one rib, left side, subsequent encounter for fracture with routine healing: Secondary | ICD-10-CM | POA: Diagnosis not present

## 2022-07-04 DIAGNOSIS — S22008D Other fracture of unspecified thoracic vertebra, subsequent encounter for fracture with routine healing: Secondary | ICD-10-CM | POA: Diagnosis not present

## 2022-07-04 DIAGNOSIS — G8330 Monoplegia, unspecified affecting unspecified side: Secondary | ICD-10-CM | POA: Diagnosis not present

## 2022-07-04 DIAGNOSIS — I739 Peripheral vascular disease, unspecified: Secondary | ICD-10-CM | POA: Diagnosis not present

## 2022-07-04 DIAGNOSIS — F4323 Adjustment disorder with mixed anxiety and depressed mood: Secondary | ICD-10-CM | POA: Diagnosis not present

## 2022-07-04 DIAGNOSIS — F32A Depression, unspecified: Secondary | ICD-10-CM | POA: Diagnosis not present

## 2022-07-04 DIAGNOSIS — S32008D Other fracture of unspecified lumbar vertebra, subsequent encounter for fracture with routine healing: Secondary | ICD-10-CM | POA: Diagnosis not present

## 2022-07-04 DIAGNOSIS — F419 Anxiety disorder, unspecified: Secondary | ICD-10-CM | POA: Diagnosis not present

## 2022-07-06 DIAGNOSIS — S32008D Other fracture of unspecified lumbar vertebra, subsequent encounter for fracture with routine healing: Secondary | ICD-10-CM | POA: Diagnosis not present

## 2022-07-06 DIAGNOSIS — I739 Peripheral vascular disease, unspecified: Secondary | ICD-10-CM | POA: Diagnosis not present

## 2022-07-06 DIAGNOSIS — S22008D Other fracture of unspecified thoracic vertebra, subsequent encounter for fracture with routine healing: Secondary | ICD-10-CM | POA: Diagnosis not present

## 2022-07-06 DIAGNOSIS — F4323 Adjustment disorder with mixed anxiety and depressed mood: Secondary | ICD-10-CM | POA: Diagnosis not present

## 2022-07-06 DIAGNOSIS — F32A Depression, unspecified: Secondary | ICD-10-CM | POA: Diagnosis not present

## 2022-07-06 DIAGNOSIS — S2232XD Fracture of one rib, left side, subsequent encounter for fracture with routine healing: Secondary | ICD-10-CM | POA: Diagnosis not present

## 2022-07-06 DIAGNOSIS — J439 Emphysema, unspecified: Secondary | ICD-10-CM | POA: Diagnosis not present

## 2022-07-06 DIAGNOSIS — F419 Anxiety disorder, unspecified: Secondary | ICD-10-CM | POA: Diagnosis not present

## 2022-07-06 DIAGNOSIS — G8330 Monoplegia, unspecified affecting unspecified side: Secondary | ICD-10-CM | POA: Diagnosis not present

## 2022-07-09 DIAGNOSIS — F419 Anxiety disorder, unspecified: Secondary | ICD-10-CM | POA: Diagnosis not present

## 2022-07-09 DIAGNOSIS — S22008D Other fracture of unspecified thoracic vertebra, subsequent encounter for fracture with routine healing: Secondary | ICD-10-CM | POA: Diagnosis not present

## 2022-07-09 DIAGNOSIS — I739 Peripheral vascular disease, unspecified: Secondary | ICD-10-CM | POA: Diagnosis not present

## 2022-07-09 DIAGNOSIS — S2232XD Fracture of one rib, left side, subsequent encounter for fracture with routine healing: Secondary | ICD-10-CM | POA: Diagnosis not present

## 2022-07-09 DIAGNOSIS — J439 Emphysema, unspecified: Secondary | ICD-10-CM | POA: Diagnosis not present

## 2022-07-09 DIAGNOSIS — S32008D Other fracture of unspecified lumbar vertebra, subsequent encounter for fracture with routine healing: Secondary | ICD-10-CM | POA: Diagnosis not present

## 2022-07-09 DIAGNOSIS — F4323 Adjustment disorder with mixed anxiety and depressed mood: Secondary | ICD-10-CM | POA: Diagnosis not present

## 2022-07-09 DIAGNOSIS — G8330 Monoplegia, unspecified affecting unspecified side: Secondary | ICD-10-CM | POA: Diagnosis not present

## 2022-07-09 DIAGNOSIS — F32A Depression, unspecified: Secondary | ICD-10-CM | POA: Diagnosis not present

## 2022-07-10 DIAGNOSIS — H353210 Exudative age-related macular degeneration, right eye, stage unspecified: Secondary | ICD-10-CM | POA: Diagnosis not present

## 2022-07-10 DIAGNOSIS — H43813 Vitreous degeneration, bilateral: Secondary | ICD-10-CM | POA: Diagnosis not present

## 2022-07-10 DIAGNOSIS — H25812 Combined forms of age-related cataract, left eye: Secondary | ICD-10-CM | POA: Diagnosis not present

## 2022-07-12 DIAGNOSIS — S22008D Other fracture of unspecified thoracic vertebra, subsequent encounter for fracture with routine healing: Secondary | ICD-10-CM | POA: Diagnosis not present

## 2022-07-12 DIAGNOSIS — F4323 Adjustment disorder with mixed anxiety and depressed mood: Secondary | ICD-10-CM | POA: Diagnosis not present

## 2022-07-12 DIAGNOSIS — F419 Anxiety disorder, unspecified: Secondary | ICD-10-CM | POA: Diagnosis not present

## 2022-07-12 DIAGNOSIS — G8330 Monoplegia, unspecified affecting unspecified side: Secondary | ICD-10-CM | POA: Diagnosis not present

## 2022-07-12 DIAGNOSIS — S32008D Other fracture of unspecified lumbar vertebra, subsequent encounter for fracture with routine healing: Secondary | ICD-10-CM | POA: Diagnosis not present

## 2022-07-12 DIAGNOSIS — S2232XD Fracture of one rib, left side, subsequent encounter for fracture with routine healing: Secondary | ICD-10-CM | POA: Diagnosis not present

## 2022-07-12 DIAGNOSIS — F32A Depression, unspecified: Secondary | ICD-10-CM | POA: Diagnosis not present

## 2022-07-12 DIAGNOSIS — J439 Emphysema, unspecified: Secondary | ICD-10-CM | POA: Diagnosis not present

## 2022-07-12 DIAGNOSIS — I739 Peripheral vascular disease, unspecified: Secondary | ICD-10-CM | POA: Diagnosis not present

## 2022-07-16 DIAGNOSIS — L603 Nail dystrophy: Secondary | ICD-10-CM | POA: Diagnosis not present

## 2022-07-18 DIAGNOSIS — F4323 Adjustment disorder with mixed anxiety and depressed mood: Secondary | ICD-10-CM | POA: Diagnosis not present

## 2022-07-18 DIAGNOSIS — S2232XD Fracture of one rib, left side, subsequent encounter for fracture with routine healing: Secondary | ICD-10-CM | POA: Diagnosis not present

## 2022-07-18 DIAGNOSIS — S32008D Other fracture of unspecified lumbar vertebra, subsequent encounter for fracture with routine healing: Secondary | ICD-10-CM | POA: Diagnosis not present

## 2022-07-18 DIAGNOSIS — I739 Peripheral vascular disease, unspecified: Secondary | ICD-10-CM | POA: Diagnosis not present

## 2022-07-18 DIAGNOSIS — J439 Emphysema, unspecified: Secondary | ICD-10-CM | POA: Diagnosis not present

## 2022-07-18 DIAGNOSIS — F32A Depression, unspecified: Secondary | ICD-10-CM | POA: Diagnosis not present

## 2022-07-18 DIAGNOSIS — S22008D Other fracture of unspecified thoracic vertebra, subsequent encounter for fracture with routine healing: Secondary | ICD-10-CM | POA: Diagnosis not present

## 2022-07-18 DIAGNOSIS — G8330 Monoplegia, unspecified affecting unspecified side: Secondary | ICD-10-CM | POA: Diagnosis not present

## 2022-07-18 DIAGNOSIS — F419 Anxiety disorder, unspecified: Secondary | ICD-10-CM | POA: Diagnosis not present

## 2022-07-19 DIAGNOSIS — S2232XD Fracture of one rib, left side, subsequent encounter for fracture with routine healing: Secondary | ICD-10-CM | POA: Diagnosis not present

## 2022-07-19 DIAGNOSIS — S32008D Other fracture of unspecified lumbar vertebra, subsequent encounter for fracture with routine healing: Secondary | ICD-10-CM | POA: Diagnosis not present

## 2022-07-19 DIAGNOSIS — F419 Anxiety disorder, unspecified: Secondary | ICD-10-CM | POA: Diagnosis not present

## 2022-07-19 DIAGNOSIS — F4323 Adjustment disorder with mixed anxiety and depressed mood: Secondary | ICD-10-CM | POA: Diagnosis not present

## 2022-07-19 DIAGNOSIS — G8330 Monoplegia, unspecified affecting unspecified side: Secondary | ICD-10-CM | POA: Diagnosis not present

## 2022-07-19 DIAGNOSIS — S22008D Other fracture of unspecified thoracic vertebra, subsequent encounter for fracture with routine healing: Secondary | ICD-10-CM | POA: Diagnosis not present

## 2022-07-19 DIAGNOSIS — J439 Emphysema, unspecified: Secondary | ICD-10-CM | POA: Diagnosis not present

## 2022-07-19 DIAGNOSIS — I739 Peripheral vascular disease, unspecified: Secondary | ICD-10-CM | POA: Diagnosis not present

## 2022-07-19 DIAGNOSIS — F32A Depression, unspecified: Secondary | ICD-10-CM | POA: Diagnosis not present

## 2022-07-21 DIAGNOSIS — F419 Anxiety disorder, unspecified: Secondary | ICD-10-CM | POA: Diagnosis not present

## 2022-07-21 DIAGNOSIS — I739 Peripheral vascular disease, unspecified: Secondary | ICD-10-CM | POA: Diagnosis not present

## 2022-07-21 DIAGNOSIS — S2232XD Fracture of one rib, left side, subsequent encounter for fracture with routine healing: Secondary | ICD-10-CM | POA: Diagnosis not present

## 2022-07-21 DIAGNOSIS — F4323 Adjustment disorder with mixed anxiety and depressed mood: Secondary | ICD-10-CM | POA: Diagnosis not present

## 2022-07-21 DIAGNOSIS — S32008D Other fracture of unspecified lumbar vertebra, subsequent encounter for fracture with routine healing: Secondary | ICD-10-CM | POA: Diagnosis not present

## 2022-07-21 DIAGNOSIS — G8330 Monoplegia, unspecified affecting unspecified side: Secondary | ICD-10-CM | POA: Diagnosis not present

## 2022-07-21 DIAGNOSIS — J439 Emphysema, unspecified: Secondary | ICD-10-CM | POA: Diagnosis not present

## 2022-07-21 DIAGNOSIS — F32A Depression, unspecified: Secondary | ICD-10-CM | POA: Diagnosis not present

## 2022-07-21 DIAGNOSIS — S22008D Other fracture of unspecified thoracic vertebra, subsequent encounter for fracture with routine healing: Secondary | ICD-10-CM | POA: Diagnosis not present

## 2022-07-24 DIAGNOSIS — F4323 Adjustment disorder with mixed anxiety and depressed mood: Secondary | ICD-10-CM | POA: Diagnosis not present

## 2022-07-24 DIAGNOSIS — S2232XD Fracture of one rib, left side, subsequent encounter for fracture with routine healing: Secondary | ICD-10-CM | POA: Diagnosis not present

## 2022-07-24 DIAGNOSIS — S32008D Other fracture of unspecified lumbar vertebra, subsequent encounter for fracture with routine healing: Secondary | ICD-10-CM | POA: Diagnosis not present

## 2022-07-24 DIAGNOSIS — G8330 Monoplegia, unspecified affecting unspecified side: Secondary | ICD-10-CM | POA: Diagnosis not present

## 2022-07-24 DIAGNOSIS — S22008D Other fracture of unspecified thoracic vertebra, subsequent encounter for fracture with routine healing: Secondary | ICD-10-CM | POA: Diagnosis not present

## 2022-07-24 DIAGNOSIS — J439 Emphysema, unspecified: Secondary | ICD-10-CM | POA: Diagnosis not present

## 2022-07-24 DIAGNOSIS — F32A Depression, unspecified: Secondary | ICD-10-CM | POA: Diagnosis not present

## 2022-07-24 DIAGNOSIS — F419 Anxiety disorder, unspecified: Secondary | ICD-10-CM | POA: Diagnosis not present

## 2022-07-24 DIAGNOSIS — I739 Peripheral vascular disease, unspecified: Secondary | ICD-10-CM | POA: Diagnosis not present

## 2022-08-01 DIAGNOSIS — G8330 Monoplegia, unspecified affecting unspecified side: Secondary | ICD-10-CM | POA: Diagnosis not present

## 2022-08-01 DIAGNOSIS — F4323 Adjustment disorder with mixed anxiety and depressed mood: Secondary | ICD-10-CM | POA: Diagnosis not present

## 2022-08-01 DIAGNOSIS — I739 Peripheral vascular disease, unspecified: Secondary | ICD-10-CM | POA: Diagnosis not present

## 2022-08-01 DIAGNOSIS — J439 Emphysema, unspecified: Secondary | ICD-10-CM | POA: Diagnosis not present

## 2022-08-01 DIAGNOSIS — F32A Depression, unspecified: Secondary | ICD-10-CM | POA: Diagnosis not present

## 2022-08-01 DIAGNOSIS — S32008D Other fracture of unspecified lumbar vertebra, subsequent encounter for fracture with routine healing: Secondary | ICD-10-CM | POA: Diagnosis not present

## 2022-08-01 DIAGNOSIS — S22008D Other fracture of unspecified thoracic vertebra, subsequent encounter for fracture with routine healing: Secondary | ICD-10-CM | POA: Diagnosis not present

## 2022-08-01 DIAGNOSIS — S2232XD Fracture of one rib, left side, subsequent encounter for fracture with routine healing: Secondary | ICD-10-CM | POA: Diagnosis not present

## 2022-08-01 DIAGNOSIS — F419 Anxiety disorder, unspecified: Secondary | ICD-10-CM | POA: Diagnosis not present

## 2022-08-06 ENCOUNTER — Ambulatory Visit: Payer: Medicare PPO | Admitting: Nurse Practitioner

## 2022-08-07 DIAGNOSIS — G8330 Monoplegia, unspecified affecting unspecified side: Secondary | ICD-10-CM | POA: Diagnosis not present

## 2022-08-07 DIAGNOSIS — F4323 Adjustment disorder with mixed anxiety and depressed mood: Secondary | ICD-10-CM | POA: Diagnosis not present

## 2022-08-07 DIAGNOSIS — S2232XD Fracture of one rib, left side, subsequent encounter for fracture with routine healing: Secondary | ICD-10-CM | POA: Diagnosis not present

## 2022-08-07 DIAGNOSIS — F32A Depression, unspecified: Secondary | ICD-10-CM | POA: Diagnosis not present

## 2022-08-07 DIAGNOSIS — S32008D Other fracture of unspecified lumbar vertebra, subsequent encounter for fracture with routine healing: Secondary | ICD-10-CM | POA: Diagnosis not present

## 2022-08-07 DIAGNOSIS — I739 Peripheral vascular disease, unspecified: Secondary | ICD-10-CM | POA: Diagnosis not present

## 2022-08-07 DIAGNOSIS — F419 Anxiety disorder, unspecified: Secondary | ICD-10-CM | POA: Diagnosis not present

## 2022-08-07 DIAGNOSIS — S22008D Other fracture of unspecified thoracic vertebra, subsequent encounter for fracture with routine healing: Secondary | ICD-10-CM | POA: Diagnosis not present

## 2022-08-07 DIAGNOSIS — J439 Emphysema, unspecified: Secondary | ICD-10-CM | POA: Diagnosis not present

## 2022-08-08 DIAGNOSIS — F32A Depression, unspecified: Secondary | ICD-10-CM | POA: Diagnosis not present

## 2022-08-08 DIAGNOSIS — J439 Emphysema, unspecified: Secondary | ICD-10-CM | POA: Diagnosis not present

## 2022-08-08 DIAGNOSIS — S22008D Other fracture of unspecified thoracic vertebra, subsequent encounter for fracture with routine healing: Secondary | ICD-10-CM | POA: Diagnosis not present

## 2022-08-08 DIAGNOSIS — F4323 Adjustment disorder with mixed anxiety and depressed mood: Secondary | ICD-10-CM | POA: Diagnosis not present

## 2022-08-08 DIAGNOSIS — F419 Anxiety disorder, unspecified: Secondary | ICD-10-CM | POA: Diagnosis not present

## 2022-08-08 DIAGNOSIS — I739 Peripheral vascular disease, unspecified: Secondary | ICD-10-CM | POA: Diagnosis not present

## 2022-08-08 DIAGNOSIS — G8330 Monoplegia, unspecified affecting unspecified side: Secondary | ICD-10-CM | POA: Diagnosis not present

## 2022-08-08 DIAGNOSIS — S32008D Other fracture of unspecified lumbar vertebra, subsequent encounter for fracture with routine healing: Secondary | ICD-10-CM | POA: Diagnosis not present

## 2022-08-08 DIAGNOSIS — S2232XD Fracture of one rib, left side, subsequent encounter for fracture with routine healing: Secondary | ICD-10-CM | POA: Diagnosis not present

## 2022-08-15 DIAGNOSIS — S32008D Other fracture of unspecified lumbar vertebra, subsequent encounter for fracture with routine healing: Secondary | ICD-10-CM | POA: Diagnosis not present

## 2022-08-15 DIAGNOSIS — F4323 Adjustment disorder with mixed anxiety and depressed mood: Secondary | ICD-10-CM | POA: Diagnosis not present

## 2022-08-15 DIAGNOSIS — F32A Depression, unspecified: Secondary | ICD-10-CM | POA: Diagnosis not present

## 2022-08-15 DIAGNOSIS — I739 Peripheral vascular disease, unspecified: Secondary | ICD-10-CM | POA: Diagnosis not present

## 2022-08-15 DIAGNOSIS — J439 Emphysema, unspecified: Secondary | ICD-10-CM | POA: Diagnosis not present

## 2022-08-15 DIAGNOSIS — F419 Anxiety disorder, unspecified: Secondary | ICD-10-CM | POA: Diagnosis not present

## 2022-08-15 DIAGNOSIS — G8330 Monoplegia, unspecified affecting unspecified side: Secondary | ICD-10-CM | POA: Diagnosis not present

## 2022-08-15 DIAGNOSIS — S2232XD Fracture of one rib, left side, subsequent encounter for fracture with routine healing: Secondary | ICD-10-CM | POA: Diagnosis not present

## 2022-08-15 DIAGNOSIS — S22008D Other fracture of unspecified thoracic vertebra, subsequent encounter for fracture with routine healing: Secondary | ICD-10-CM | POA: Diagnosis not present

## 2022-08-16 ENCOUNTER — Other Ambulatory Visit: Payer: Self-pay

## 2022-08-16 ENCOUNTER — Inpatient Hospital Stay (HOSPITAL_COMMUNITY)
Admission: EM | Admit: 2022-08-16 | Discharge: 2022-08-18 | DRG: 603 | Disposition: A | Discharge status: Home Health | Payer: Medicare PPO | Attending: Internal Medicine | Admitting: Internal Medicine

## 2022-08-16 ENCOUNTER — Encounter (HOSPITAL_COMMUNITY): Payer: Self-pay

## 2022-08-16 DIAGNOSIS — F419 Anxiety disorder, unspecified: Secondary | ICD-10-CM | POA: Diagnosis present

## 2022-08-16 DIAGNOSIS — R600 Localized edema: Secondary | ICD-10-CM

## 2022-08-16 DIAGNOSIS — K219 Gastro-esophageal reflux disease without esophagitis: Secondary | ICD-10-CM | POA: Diagnosis present

## 2022-08-16 DIAGNOSIS — M4802 Spinal stenosis, cervical region: Secondary | ICD-10-CM | POA: Diagnosis present

## 2022-08-16 DIAGNOSIS — R7989 Other specified abnormal findings of blood chemistry: Secondary | ICD-10-CM | POA: Diagnosis not present

## 2022-08-16 DIAGNOSIS — L0390 Cellulitis, unspecified: Secondary | ICD-10-CM | POA: Diagnosis not present

## 2022-08-16 DIAGNOSIS — L409 Psoriasis, unspecified: Secondary | ICD-10-CM | POA: Diagnosis present

## 2022-08-16 DIAGNOSIS — Z96653 Presence of artificial knee joint, bilateral: Secondary | ICD-10-CM | POA: Diagnosis present

## 2022-08-16 DIAGNOSIS — R296 Repeated falls: Secondary | ICD-10-CM | POA: Diagnosis present

## 2022-08-16 DIAGNOSIS — F32A Depression, unspecified: Secondary | ICD-10-CM | POA: Diagnosis present

## 2022-08-16 DIAGNOSIS — R52 Pain, unspecified: Secondary | ICD-10-CM | POA: Diagnosis not present

## 2022-08-16 DIAGNOSIS — Z79899 Other long term (current) drug therapy: Secondary | ICD-10-CM

## 2022-08-16 DIAGNOSIS — H3530 Unspecified macular degeneration: Secondary | ICD-10-CM | POA: Diagnosis present

## 2022-08-16 DIAGNOSIS — L03115 Cellulitis of right lower limb: Secondary | ICD-10-CM | POA: Diagnosis present

## 2022-08-16 DIAGNOSIS — M810 Age-related osteoporosis without current pathological fracture: Secondary | ICD-10-CM | POA: Diagnosis present

## 2022-08-16 DIAGNOSIS — I878 Other specified disorders of veins: Secondary | ICD-10-CM | POA: Diagnosis present

## 2022-08-16 DIAGNOSIS — M7989 Other specified soft tissue disorders: Secondary | ICD-10-CM | POA: Diagnosis not present

## 2022-08-16 DIAGNOSIS — Z87891 Personal history of nicotine dependence: Secondary | ICD-10-CM | POA: Diagnosis not present

## 2022-08-16 DIAGNOSIS — B009 Herpesviral infection, unspecified: Secondary | ICD-10-CM | POA: Diagnosis present

## 2022-08-16 DIAGNOSIS — R609 Edema, unspecified: Secondary | ICD-10-CM | POA: Diagnosis not present

## 2022-08-16 LAB — CBC WITH DIFFERENTIAL/PLATELET
Abs Immature Granulocytes: 0.02 10*3/uL (ref 0.00–0.07)
Basophils Absolute: 0.1 10*3/uL (ref 0.0–0.1)
Basophils Relative: 1 %
Eosinophils Absolute: 0.2 10*3/uL (ref 0.0–0.5)
Eosinophils Relative: 3 %
HCT: 42.5 % (ref 36.0–46.0)
Hemoglobin: 12.9 g/dL (ref 12.0–15.0)
Immature Granulocytes: 0 %
Lymphocytes Relative: 24 %
Lymphs Abs: 1.4 10*3/uL (ref 0.7–4.0)
MCH: 27.4 pg (ref 26.0–34.0)
MCHC: 30.4 g/dL (ref 30.0–36.0)
MCV: 90.2 fL (ref 80.0–100.0)
Monocytes Absolute: 0.5 10*3/uL (ref 0.1–1.0)
Monocytes Relative: 8 %
Neutro Abs: 3.9 10*3/uL (ref 1.7–7.7)
Neutrophils Relative %: 64 %
Platelets: 212 10*3/uL (ref 150–400)
RBC: 4.71 MIL/uL (ref 3.87–5.11)
RDW: 12.9 % (ref 11.5–15.5)
WBC: 6 10*3/uL (ref 4.0–10.5)
nRBC: 0 % (ref 0.0–0.2)

## 2022-08-16 LAB — LACTIC ACID, PLASMA: Lactic Acid, Venous: 0.8 mmol/L (ref 0.5–1.9)

## 2022-08-16 LAB — BASIC METABOLIC PANEL
Anion gap: 7 (ref 5–15)
BUN: 16 mg/dL (ref 8–23)
CO2: 29 mmol/L (ref 22–32)
Calcium: 8.9 mg/dL (ref 8.9–10.3)
Chloride: 104 mmol/L (ref 98–111)
Creatinine, Ser: 0.61 mg/dL (ref 0.44–1.00)
GFR, Estimated: >60 mL/min (ref >60)
Glucose, Bld: 97 mg/dL (ref 70–99)
Potassium: 3.8 mmol/L (ref 3.5–5.1)
Sodium: 140 mmol/L (ref 135–145)

## 2022-08-16 MED ORDER — CEFAZOLIN SODIUM-DEXTROSE 1-4 GM/50ML-% IV SOLN
1.0000 g | Freq: Once | INTRAVENOUS | Status: AC
  Administered 2022-08-16: 1 g via INTRAVENOUS
  Filled 2022-08-16: qty 50

## 2022-08-16 MED ORDER — CEFAZOLIN SODIUM-DEXTROSE 1-4 GM/50ML-% IV SOLN
1.0000 g | Freq: Three times a day (TID) | INTRAVENOUS | Status: DC
  Administered 2022-08-17 – 2022-08-18 (×4): 1 g via INTRAVENOUS
  Filled 2022-08-16 (×5): qty 50

## 2022-08-16 MED ORDER — ENOXAPARIN SODIUM 40 MG/0.4ML IJ SOSY: 40.0000 mg | PREFILLED_SYRINGE | INTRAMUSCULAR | Status: DC

## 2022-08-16 MED ORDER — ACETAMINOPHEN 650 MG RE SUPP: 650.0000 mg | Freq: Four times a day (QID) | RECTAL | Status: DC | PRN

## 2022-08-16 MED ORDER — FUROSEMIDE 10 MG/ML IJ SOLN
20.0000 mg | Freq: Once | INTRAMUSCULAR | Status: AC
  Administered 2022-08-17: 20 mg via INTRAVENOUS
  Filled 2022-08-16: qty 2

## 2022-08-16 MED ORDER — ACETAMINOPHEN 325 MG PO TABS
650.0000 mg | ORAL_TABLET | Freq: Four times a day (QID) | ORAL | Status: DC | PRN
  Administered 2022-08-17: 650 mg via ORAL
  Filled 2022-08-16: qty 2

## 2022-08-16 NOTE — ED Notes (Signed)
Pt to room from waiting room. Pt complains of right leg swelling. Pt hooked up to monitors in hallway bed. Vitals stable. Call bell within reach. No other acute distress noted at this time.

## 2022-08-16 NOTE — H&P (Signed)
History and Physical    [name] [id number] DOB: 1940-09-17 DOA: 08/16/2022  PCP: Charlane Ferretti, MD  Patient coming from: Home  Chief Complaint: Right leg pain and swelling  HPI: [name] is a 82 y.o. female with medical history significant of anxiety, depression, arthritis, GERD, osteoporosis, degenerative disc disease, cervical spinal stenosis, psoriasis, history of herpes simplex infection, B12 deficiency, macular degeneration.  Admitted for back/neck pain/multiple compression fractures and rib fracture in December 2023 in the setting of recurrent falls.  Patient presents to the ED today via EMS for evaluation of right leg pain, swelling, and erythema.  Vital signs stable.  Labs showing no leukocytosis, lactic acid normal, blood cultures drawn. Patient was given cefazolin due to concern for cellulitis of her right lower extremity.  [facility] called to admit.  Patient reports 1 month history of progressively worsening bilateral lower extremity edema.  A few days ago she bumped her right lower leg against the sharp edge of her dishwasher machine door and had a small skin tear in that area.  Since then she has noticed worsening erythema of this leg but no pus drainage.  She is also reporting pain in her right lower leg.  She denies history of blood clots.  Patient states she was admitted for rib fracture back in December and since then has been very careful at home and always uses a walker and has not had any more falls.  She denies shortness of breath or chest pain.  Review of Systems:  Review of Systems  All other systems reviewed and are negative.   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 Her smoking use included cigarettes. She smoked an average of .2 packs per day. She has never used smokeless tobacco. She reports that she does not drink alcohol and does not use drugs.  No Known Allergies  Family History  Problem Relation Age of Onset   Cancer Mother        GALLBLADDER    Breast cancer Cousin        Mat. 1st cousin Age 36    Prior to Admission medications   Medication Sig Start Date End Date Taking? Authorizing Provider  acetaminophen (TYLENOL) 325 MG tablet Take 2 tablets (650 mg total) by mouth every 6 (six) hours as needed for mild pain (or Fever >/= 101). 05/27/22   Bonnielee Haff, MD  amoxicillin (AMOXIL) 500 MG capsule Take 500 mg by mouth See admin instructions. Take one pill before dentist appointment. 06/30/12   [provider]  Calcium Carbonate-Vitamin D3 600-400 MG-UNIT TABS Take 1 tablet by mouth daily.  [provider]  Cetirizine HCl (ZYRTEC PO) Take 1 tablet by mouth daily as needed (Allergies).    [provider]  cyanocobalamin 1000 MCG tablet Take 1 tablet (1,000 mcg total) by mouth daily. 05/30/22   Bonnielee Haff, MD  feeding supplement (ENSURE ENLIVE / ENSURE PLUS) LIQD Take 237 mLs by mouth 2 (two) times daily between meals. 05/27/22   Bonnielee Haff, MD  FLUoxetine (PROZAC) 20 MG tablet Take 60 mg by mouth daily.    [provider]  lidocaine (LIDODERM) 5 % Place 1 patch onto the skin daily. Remove & Discard patch within 12 hours or as directed by MD 05/27/22   Bonnielee Haff, MD  LORazepam (ATIVAN)  0.5 MG tablet Take 1 tablet (0.5 mg total) by mouth daily as needed for anxiety or sleep. 05/27/22   Bonnielee Haff, MD  methocarbamol (ROBAXIN) 500 MG tablet Take 1 tablet (500 mg total) by mouth every 6 (six) hours as needed for muscle spasms. 05/28/22   Bonnielee Haff, MD  Multiple Vitamins-Minerals (WOMENS MULTI PO) Take 1 tablet by mouth daily.    [provider]  oxyCODONE (OXY IR/ROXICODONE) 5 MG immediate release tablet Take 1 tablet (5 mg total) by mouth every 4 (four) hours as needed for severe pain. 05/27/22   Bonnielee Haff, MD  polyethylene glycol (MIRALAX / GLYCOLAX) 17 g packet Take 17 g by mouth 2 (two) times daily. 05/28/22   Bonnielee Haff, MD  senna-docusate (SENOKOT-S) 8.6-50 MG tablet Take 2 tablets by mouth 2 (two) times daily. 05/27/22   Bonnielee Haff, MD  valACYclovir (VALTREX) 500 MG tablet TAKE 1 TABLET BY MOUTH EVERY DAY Patient taking differently: Take 500 mg by mouth daily. 01/08/22   Tamela Gammon, NP    Physical Exam: Vitals:   08/16/22 1911  BP: (!) 143/84  Pulse: 74  Resp: 16  Temp: 98.1 F (36.7 C)  TempSrc: Oral  SpO2: 100%    Physical Exam Vitals reviewed.  Constitutional:      General: She is not in acute distress. HENT:     Head: Normocephalic and atraumatic.  Eyes:     Extraocular Movements: Extraocular movements intact.  Cardiovascular:     Rate and Rhythm: Normal rate and regular rhythm.     Pulses: Normal pulses.  Pulmonary:     Effort: Pulmonary effort is normal. No respiratory distress.     Breath sounds: Normal breath sounds. No wheezing or rales.  Abdominal:     General: Bowel sounds are normal. There is no distension.     Palpations: Abdomen is soft.     Tenderness: There is no abdominal tenderness.  Musculoskeletal:     Cervical back: Normal range of motion.     Right lower leg: Edema present.     Left lower leg: Edema present.     Comments: 3+ pitting edema of bilateral lower lower extremities  Skin:     General: Skin is warm and dry.     Findings: Erythema present.     Comments: Right lower leg erythematous and warm to touch  Neurological:     General: No focal deficit present.     Mental Status: She is alert and oriented to person, place, and time.     Labs on Admission: I have personally reviewed following labs and imaging studies  CBC: Recent Labs  Lab 08/16/22 1939  WBC 6.0  NEUTROABS 3.9  HGB 12.9  HCT 42.5  MCV 90.2  PLT 99991111   Basic Metabolic  Panel: Recent Labs  Lab 08/16/22 1939  NA 140  K 3.8  CL 104  CO2 29  GLUCOSE 97  BUN 16  CREATININE 0.61  CALCIUM 8.9   GFR: CrCl cannot be calculated (Unknown ideal weight.). Liver Function Tests: No results for input(s): "AST", "ALT", "ALKPHOS", "BILITOT", "PROT", "ALBUMIN" in the last 168 hours. No results for input(s): "LIPASE", "AMYLASE" in the last 168 hours. No results for input(s): "AMMONIA" in the last 168 hours. Coagulation Profile: No results for input(s): "INR", "PROTIME" in the last 168 hours. Cardiac Enzymes: No results for input(s): "CKTOTAL", "CKMB", "CKMBINDEX", "TROPONINI" in the last 168 hours. BNP (last 3 results) No results for input(s): "PROBNP" in the last 8760 hours. HbA1C: No results for input(s): "HGBA1C" in the last 72 hours. CBG: No results for input(s): "GLUCAP" in the last 168 hours. Lipid Profile: No results for input(s): "CHOL", "HDL", "LDLCALC", "TRIG", "CHOLHDL", "LDLDIRECT" in the last 72 hours. Thyroid Function Tests: No results for input(s): "TSH", "T4TOTAL", "FREET4", "T3FREE", "THYROIDAB" in the last 72 hours. Anemia Panel: No results for input(s): "VITAMINB12", "FOLATE", "FERRITIN", "TIBC", "IRON", "RETICCTPCT" in the last 72 hours. Urine analysis:    Component Value Date/Time   COLORURINE RED (A) 09/07/2019 1400   APPEARANCEUR CLOUDY (A) 09/07/2019 1400   LABSPEC 1.020 09/07/2019 1400   PHURINE 7.0 09/07/2019 1400   GLUCOSEU NEGATIVE 09/07/2019 1400   HGBUR 3+ (A)  09/07/2019 1400   BILIRUBINUR NEGATIVE 01/19/2013 1022   KETONESUR NEGATIVE 09/07/2019 1400   PROTEINUR 1+ (A) 09/07/2019 1400   UROBILINOGEN 0.2 01/19/2013 1022   NITRITE NEGATIVE 01/19/2013 1022   LEUKOCYTESUR MODERATE (A) 01/19/2013 1022    Radiological Exams on Admission: No results found.  Assessment and Plan  Right lower extremity cellulitis Patient presenting with progressively worsening erythema and warmth of her right lower leg in the setting of recent minor skin tear.  No purulent drainage.  No fever, leukocytosis, or lactic acidosis.  No signs of sepsis.  Continue cefazolin.  Blood cultures pending.  Trend lactate and WBC count.  Bilateral lower extremity edema Patient reports 1 month history of progressively worsening bilateral lower extremity edema.  No documented history of CHF or prior echo results in the chart.  Lungs clear on exam.  Renal function stable and blood pressure stable.  IV Lasix 20 mg x 1 ordered.  Check BNP.  Also given erythema, pain, and warmth of the right lower extremity, will check D-dimer, if elevated, start IV heparin for anticoagulation until bilateral lower extremity Dopplers can be done in the morning to rule out DVT.  No clinical signs of PE at this time.  Patient is not having any chest pain or shortness of breath, not tachycardic or tachypneic, and not hypoxic.  Anxiety and depression GERD Pharmacy med rec pending.  DVT prophylaxis: Lovenox Code Status: Full Code (discussed with the patient) Family Communication: Daughter at bedside. Level of care: Telemetry bed Admission status: It is my clinical opinion that referral for OBSERVATION is reasonable and necessary in this patient based on the above information provided. The aforementioned taken together are felt to place the patient at high risk for further clinical deterioration. However, it is anticipated that the patient may be medically stable for discharge from the hospital within 24 to 48  hours.   Shela Leff MD [facility]  If 7PM-7AM, please contact night-coverage [email or web address]  08/16/2022, 10:04 PM

## 2022-08-16 NOTE — ED Provider Notes (Signed)
[facility] Provider Note   CSN: [id number] Arrival date & time: 08/16/22  1856     History  Chief Complaint  Patient presents with   Leg Swelling    [name] is a 82 y.o. female.  Past medical history of degenerative disc disease, anxiety depression and was admitted to the hospital in December 2023 for rib fractures after a fall.  She has been ambulating with a walker since discharge and is having still frequent falls.  She presents today severely because she has right leg pain and swelling with redness.  Denies fever or chills.  She states the whole leg is swollen.  She is worried about possible blood clot because she has a friend who had 1 and states it was serious.  She has no chest pain, shortness of breath, palpitations or dizziness or other complaints.  HPI     Home Medications Prior to Admission medications   Medication Sig Start Date End Date Taking? Authorizing Provider  acetaminophen (TYLENOL) 325 MG tablet Take 2 tablets (650 mg total) by mouth every 6 (six) hours as needed for mild pain (or Fever >/= 101). 05/27/22   Bonnielee Haff, MD  amoxicillin (AMOXIL) 500 MG capsule Take 500 mg by mouth See admin instructions. Take one pill before dentist appointment. 06/30/12   [provider]  Calcium Carbonate-Vitamin D3 600-400 MG-UNIT TABS Take 1 tablet by mouth daily.    [provider]  Cetirizine HCl (ZYRTEC PO) Take 1 tablet by mouth daily as needed (Allergies).    [provider]  cyanocobalamin 1000 MCG tablet Take 1 tablet (1,000 mcg total) by mouth daily. 05/30/22   Bonnielee Haff, MD  feeding supplement (ENSURE ENLIVE / ENSURE PLUS) LIQD Take 237 mLs by mouth 2 (two) times daily between meals. 05/27/22   Bonnielee Haff, MD  FLUoxetine (PROZAC) 20 MG tablet Take 60 mg by mouth daily.    [provider]  lidocaine (LIDODERM) 5 % Place 1 patch onto the skin daily. Remove & Discard  patch within 12 hours or as directed by MD 05/27/22   Bonnielee Haff, MD  LORazepam (ATIVAN) 0.5 MG tablet Take 1 tablet (0.5 mg total) by mouth daily as needed for anxiety or sleep. 05/27/22   Bonnielee Haff, MD  methocarbamol (ROBAXIN) 500 MG tablet Take 1 tablet (500 mg total) by mouth every 6 (six) hours as needed for muscle spasms. 05/28/22   Bonnielee Haff, MD  Multiple Vitamins-Minerals (WOMENS MULTI PO) Take 1 tablet by mouth daily.    [provider]  oxyCODONE (OXY IR/ROXICODONE) 5 MG immediate release tablet Take 1 tablet (5 mg total) by mouth every 4 (four) hours as needed for severe pain. 05/27/22   Bonnielee Haff, MD  polyethylene glycol (MIRALAX / GLYCOLAX) 17 g packet Take 17 g by mouth 2 (two) times daily. 05/28/22   Bonnielee Haff, MD  senna-docusate (SENOKOT-S) 8.6-50 MG tablet Take 2 tablets by mouth 2 (two) times daily. 05/27/22   Bonnielee Haff, MD  valACYclovir (VALTREX) 500 MG tablet TAKE 1 TABLET BY MOUTH EVERY DAY Patient taking differently: Take 500 mg by mouth daily. 01/08/22   Tamela Gammon, NP      Allergies    Patient has no known allergies.    Review of Systems   Review of Systems  Physical Exam Updated Vital Signs There were no vitals taken for this visit. Physical Exam Vitals and nursing note reviewed.  Constitutional:  General: She is not in acute distress.    Appearance: She is well-developed.  HENT:     Head: Normocephalic and atraumatic.     Mouth/Throat:     Mouth: Mucous membranes are moist.  Eyes:     Conjunctiva/sclera: Conjunctivae normal.  Cardiovascular:     Rate and Rhythm: Normal rate and regular rhythm.     Heart sounds: No murmur heard. Pulmonary:     Effort: Pulmonary effort is normal. No respiratory distress.     Breath sounds: Normal breath sounds.  Abdominal:     Palpations: Abdomen is soft.     Tenderness: There is no abdominal tenderness.  Musculoskeletal:        General: Swelling present.      Cervical back: Neck supple.     Right lower leg: Edema present.     Comments: Swelling right lower leg with tenderness of calf and swelling of anterior lower extremity as well.  DP and PT pulses are 2+ in bilateral feet  Skin:    General: Skin is warm and dry.     Capillary Refill: Capillary refill takes less than 2 seconds.     Comments: Small healing abrasion right anterior shin, no drainage.  There is redness and warmth of the skin of the anterior shin and calf extending to the dorsum of the right foot.  Neurological:     General: No focal deficit present.     Mental Status: She is alert.  Psychiatric:        Mood and Affect: Mood normal.     ED Results / Procedures / Treatments   Labs (all labs ordered are listed, but only abnormal results are displayed) Labs Reviewed - No data to display  EKG None  Radiology No results found.  Procedures Procedures    Medications Ordered in ED Medications - No data to display  ED Course/ Medical Decision Making/ A&P                             Medical Decision Making This patient presents to the ED for concern of leg pain and swelling with redness that started yesterday, this involves an extensive number of treatment options, and is a complaint that carries with it a high risk of complications and morbidity.  The differential diagnosis includes lettuce, abscess, DVT, other   Co morbidities that complicate the patient evaluation  Lumbar spondylosis, frequent falls   Additional history obtained:  Additional history obtained from EMR External records from outside source obtained and reviewed including prior hospitalization notes for rib fractures   Lab Tests:  I Ordered, and personally interpreted labs.  The pertinent results include: Normal CBC, BMP and lactic acid     Consultations Obtained:  I requested consultation with the specialist,  and discussed lab and imaging findings as well as pertinent plan - they  recommend: admission   Problem List / ED Course / Critical interventions / Medication management  Left leg pain swelling and redness-appears to be cellulitis, patient also has concern for possible DVT, unable to order vascular ultrasound at this time because of no vascular tech available.  Given that her redness rapidly progressed over 24 hours I think admission is reasonable, she is given Ancef for antibiotics, she has no signs or symptoms of PE, does not meet sepsis criteria.  Feel admission is needed to observe the cellulitis to make sure it does not progress further requiring additional or  different antibiotics and so patient can obtain vascular study to rule out DVT.  I do not feel patient is good candidate for anticoagulation and discharged home to return for DVT study given her frequent falls and advanced age.   Gust case with hospitalist as above and they are agreeable with admission. I ordered medication including Ancef for colitis Reevaluation of the patient after these medicines showed that the patient stayed the same, patient declined need for pain medication I have reviewed the patients home medicines and have made adjustments as needed   Social Determinants of Health:  Patient lives alone   Test / Admission - Considered:  Consider DVT study but unable to get it due to being past 7 PM.    Amount and/or Complexity of Data Reviewed Labs: ordered.  Risk Prescription drug management.           Final Clinical Impression(s) / ED Diagnoses Final diagnoses:  None    Rx / DC Orders ED Discharge Orders     None         Darci Current 08/16/22 2206    Valarie Merino, MD 08/17/22 [phone]

## 2022-08-16 NOTE — ED Triage Notes (Signed)
Pt BIB [facility] from home d/t R leg swelling, red and warm to touch that started towards the end of last week. No injuries noted. A&O X4. VSS.

## 2022-08-17 ENCOUNTER — Observation Stay (HOSPITAL_COMMUNITY): Payer: Medicare PPO

## 2022-08-17 ENCOUNTER — Other Ambulatory Visit (HOSPITAL_COMMUNITY): Payer: Self-pay

## 2022-08-17 DIAGNOSIS — M4802 Spinal stenosis, cervical region: Secondary | ICD-10-CM | POA: Diagnosis present

## 2022-08-17 DIAGNOSIS — H3530 Unspecified macular degeneration: Secondary | ICD-10-CM | POA: Diagnosis present

## 2022-08-17 DIAGNOSIS — L0390 Cellulitis, unspecified: Secondary | ICD-10-CM

## 2022-08-17 DIAGNOSIS — R52 Pain, unspecified: Secondary | ICD-10-CM | POA: Diagnosis not present

## 2022-08-17 DIAGNOSIS — Z87891 Personal history of nicotine dependence: Secondary | ICD-10-CM | POA: Diagnosis not present

## 2022-08-17 DIAGNOSIS — R7989 Other specified abnormal findings of blood chemistry: Secondary | ICD-10-CM | POA: Diagnosis not present

## 2022-08-17 DIAGNOSIS — M810 Age-related osteoporosis without current pathological fracture: Secondary | ICD-10-CM | POA: Diagnosis present

## 2022-08-17 DIAGNOSIS — R296 Repeated falls: Secondary | ICD-10-CM | POA: Diagnosis present

## 2022-08-17 DIAGNOSIS — M7989 Other specified soft tissue disorders: Secondary | ICD-10-CM | POA: Diagnosis not present

## 2022-08-17 DIAGNOSIS — B009 Herpesviral infection, unspecified: Secondary | ICD-10-CM | POA: Diagnosis present

## 2022-08-17 DIAGNOSIS — L03115 Cellulitis of right lower limb: Secondary | ICD-10-CM | POA: Diagnosis present

## 2022-08-17 DIAGNOSIS — Z79899 Other long term (current) drug therapy: Secondary | ICD-10-CM | POA: Diagnosis not present

## 2022-08-17 DIAGNOSIS — I878 Other specified disorders of veins: Secondary | ICD-10-CM | POA: Diagnosis present

## 2022-08-17 DIAGNOSIS — Z96653 Presence of artificial knee joint, bilateral: Secondary | ICD-10-CM | POA: Diagnosis present

## 2022-08-17 DIAGNOSIS — F32A Depression, unspecified: Secondary | ICD-10-CM | POA: Diagnosis present

## 2022-08-17 DIAGNOSIS — L409 Psoriasis, unspecified: Secondary | ICD-10-CM | POA: Diagnosis present

## 2022-08-17 DIAGNOSIS — F419 Anxiety disorder, unspecified: Secondary | ICD-10-CM | POA: Diagnosis present

## 2022-08-17 DIAGNOSIS — K219 Gastro-esophageal reflux disease without esophagitis: Secondary | ICD-10-CM | POA: Diagnosis present

## 2022-08-17 LAB — D-DIMER, QUANTITATIVE: D-Dimer, Quant: 1.33 ug/mL-FEU — ABNORMAL HIGH (ref 0.00–0.50)

## 2022-08-17 LAB — BRAIN NATRIURETIC PEPTIDE: B Natriuretic Peptide: 103.4 pg/mL — ABNORMAL HIGH (ref 0.0–100.0)

## 2022-08-17 LAB — HEPARIN LEVEL (UNFRACTIONATED)
Heparin Unfractionated: 0.23 IU/mL — ABNORMAL LOW (ref 0.30–0.70)
Heparin Unfractionated: 0.38 IU/mL (ref 0.30–0.70)

## 2022-08-17 LAB — LACTIC ACID, PLASMA: Lactic Acid, Venous: 1.4 mmol/L (ref 0.5–1.9)

## 2022-08-17 LAB — CBC
HCT: 39.1 % (ref 36.0–46.0)
Hemoglobin: 12.4 g/dL (ref 12.0–15.0)
MCH: 27.9 pg (ref 26.0–34.0)
MCHC: 31.7 g/dL (ref 30.0–36.0)
MCV: 87.9 fL (ref 80.0–100.0)
Platelets: 179 10*3/uL (ref 150–400)
RBC: 4.45 MIL/uL (ref 3.87–5.11)
RDW: 13 % (ref 11.5–15.5)
WBC: 6.1 10*3/uL (ref 4.0–10.5)
nRBC: 0 % (ref 0.0–0.2)

## 2022-08-17 MED ORDER — HEPARIN BOLUS VIA INFUSION
4000.0000 [IU] | Freq: Once | INTRAVENOUS | Status: AC
  Administered 2022-08-17: 4000 [IU] via INTRAVENOUS
  Filled 2022-08-17: qty 4000

## 2022-08-17 MED ORDER — HEPARIN (PORCINE) 25000 UT/250ML-% IV SOLN
1450.0000 [IU]/h | INTRAVENOUS | Status: DC
  Administered 2022-08-17: 1450 [IU]/h via INTRAVENOUS
  Administered 2022-08-17: 1300 [IU]/h via INTRAVENOUS
  Filled 2022-08-17 (×2): qty 250

## 2022-08-17 MED ORDER — FLUOXETINE HCL 20 MG PO TABS
60.0000 mg | ORAL_TABLET | Freq: Every day | ORAL | Status: DC
  Administered 2022-08-17: 60 mg via ORAL
  Filled 2022-08-17 (×2): qty 3

## 2022-08-17 MED ORDER — ENOXAPARIN SODIUM 40 MG/0.4ML IJ SOSY: 40.0000 mg | PREFILLED_SYRINGE | Freq: Every day | INTRAMUSCULAR | Status: DC

## 2022-08-17 NOTE — Progress Notes (Signed)
ANTICOAGULATION CONSULT NOTE - Initial Consult  Pharmacy Consult for heparin Indication: r/o DVT  No Known Allergies  Patient Measurements: Height: '5\' 8"'$  (172.7 cm) Weight: 90.7 kg (200 lb) IBW/kg (Calculated) : 63.9 Heparin Dosing Weight: 85kg  Vital Signs: Temp: 98.1 F (36.7 C) (02/29 1911) Temp Source: Oral (02/29 1911) BP: 159/79 (03/01 0245) Pulse Rate: 80 (03/01 0245)  Labs: Recent Labs    08/16/22 1939  HGB 12.9  HCT 42.5  PLT 212  CREATININE 0.61    Estimated Creatinine Clearance: 63.9 mL/min (by C-G formula based on SCr of 0.61 mg/dL).   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  Goal of Therapy:  Heparin level 0.3-0.7 units/ml Monitor platelets by anticoagulation protocol: Yes   Plan:  Heparin 4000 units IV bolus x1 followed by infusion at 1300 units/hr. Monitor heparin levels and CBC.  Wynona Neat, PharmD, BCPS  08/17/2022,3:05 AM

## 2022-08-17 NOTE — Progress Notes (Signed)
PT Cancellation Note  Patient Details Name: [name] MRN: [id number] DOB: [birth year]   Cancelled Treatment:    Reason Eval/Treat Not Completed: Other (comment)  Awaiting doppler to r/u DVT (on heparin <24 hrs). Will check back later this afternoon for comprehensive PT evaluation. If results not in, will follow up in AM for assessment. Please secure chat for any urgent needs.   Candie Mile, PT, DPT Physical Therapist [facility]  08/17/2022, 2:42 PM

## 2022-08-17 NOTE — Progress Notes (Addendum)
PROGRESS NOTE  [name] [id number] DOB: 06-26-1940 DOA: 08/16/2022 PCP: Charlane Ferretti, MD  HPI/Recap of past 24 hours:   [name] is a 82 y.o. female with medical history significant of anxiety, depression, arthritis, GERD, osteoporosis, degenerative disc disease, cervical spinal stenosis, psoriasis, history of herpes simplex infection, B12 deficiency, macular degeneration.  Admitted for back/neck pain/multiple compression fractures and rib fracture in December 2023 in the setting of recurrent falls.  Patient presents to the ED today via EMS for evaluation of right leg pain, swelling, and erythema.  Vital signs stable.  Labs showing no leukocytosis, lactic acid normal, blood cultures drawn. Patient was given cefazolin due to concern for cellulitis of her right lower extremity.  [facility] called to admit.   Patient reports 1 month history of progressively worsening bilateral lower extremity edema.  A few days ago she bumped her right lower leg against the sharp edge of her dishwasher machine door and had a small skin tear in that area.  Since then she has noticed worsening erythema of this leg but no pus drainage.  She is also reporting pain in her right lower leg.  She denies history of blood clots.  Patient states she was admitted for rib fracture back in December and since then has been very careful at home and always uses a walker and has not had any more falls.  She denies shortness of breath or chest pain.  08/17/2022: The patient was seen and examined at bedside.  Feels well right lower extremity pain is improved.  Right lower extremity appears cellulitic.  Assessment/Plan: Principal Problem:   Cellulitis Active Problems:   Anxiety and depression   Bilateral lower extremity edema   GERD (gastroesophageal reflux disease)  Right lower extremity cellulitis Patient presenting with progressively worsening erythema and warmth of her right lower leg in the setting of recent minor  skin tear.  No purulent drainage.  No fever, leukocytosis, or lactic acidosis.  No signs of sepsis.  Continue cefazolin.  Blood cultures pending.  Trend lactate and WBC count.   Bilateral lower extremity edema Doppler ultrasound, bilateral, negative for DVT.   Anxiety and depression GERD Stable Resume home regimen.  Physical debility PT OT assessment Fall precautions.   DVT prophylaxis: Lovenox Code Status: Full Code (discussed with the patient) Family Communication: Daughter at bedside. Level of care: Telemetry bed    Status is: Inpatient The patient requires at least 2 midnights of any evaluation and treatment of present condition.    Objective: Vitals:   08/17/22 0615 08/17/22 0700 08/17/22 0908 08/17/22 1206  BP: (!) 181/69  (!) 162/100 (!) 161/75  Pulse: 73  68 73  Resp:   20 20  Temp:  98.2 F (36.8 C) 98.1 F (36.7 C) 98.8 F (37.1 C)  TempSrc:  Oral Oral Oral  SpO2: 93% 94% 97% 95%  Weight:  98.1 kg    Height:  '5\' 8"'$  (1.727 m)      Intake/Output Summary (Last 24 hours) at 08/17/2022 1324 Last data filed at 08/17/2022 0315 Gross per 24 hour  Intake 100 ml  Output --  Net 100 ml   Filed Weights   08/17/22 0200 08/17/22 0700  Weight: 90.7 kg 98.1 kg    Exam:  General: 82 y.o. year-old female well developed well nourished in no acute distress.  Alert and oriented x3. Cardiovascular: Regular rate and rhythm with no rubs or gallops.  No thyromegaly or JVD noted.   Respiratory: Clear to  auscultation with no wheezes or rales. Good inspiratory effort. Abdomen: Soft nontender nondistended with normal bowel sounds x4 quadrants. Musculoskeletal: No lower extremity edema. 2/4 pulses in all 4 extremities. Skin: Right lower extremity is erythematous, edematous, warm and tender. Psychiatry: Mood is appropriate for condition and setting   Data Reviewed: CBC: Recent Labs  Lab 08/16/22 1939 08/17/22 0328  WBC 6.0 6.1  NEUTROABS 3.9  --   HGB 12.9 12.4  HCT  42.5 39.1  MCV 90.2 87.9  PLT 212 0000000   Basic Metabolic Panel: Recent Labs  Lab 08/16/22 1939  NA 140  K 3.8  CL 104  CO2 29  GLUCOSE 97  BUN 16  CREATININE 0.61  CALCIUM 8.9   GFR: Estimated Creatinine Clearance: 66.4 mL/min (by C-G formula based on SCr of 0.61 mg/dL). Liver Function Tests: No results for input(s): "AST", "ALT", "ALKPHOS", "BILITOT", "PROT", "ALBUMIN" in the last 168 hours. No results for input(s): "LIPASE", "AMYLASE" in the last 168 hours. No results for input(s): "AMMONIA" in the last 168 hours. Coagulation Profile: No results for input(s): "INR", "PROTIME" in the last 168 hours. Cardiac Enzymes: No results for input(s): "CKTOTAL", "CKMB", "CKMBINDEX", "TROPONINI" in the last 168 hours. BNP (last 3 results) No results for input(s): "PROBNP" in the last 8760 hours. HbA1C: No results for input(s): "HGBA1C" in the last 72 hours. CBG: No results for input(s): "GLUCAP" in the last 168 hours. Lipid Profile: No results for input(s): "CHOL", "HDL", "LDLCALC", "TRIG", "CHOLHDL", "LDLDIRECT" in the last 72 hours. Thyroid Function Tests: No results for input(s): "TSH", "T4TOTAL", "FREET4", "T3FREE", "THYROIDAB" in the last 72 hours. Anemia Panel: No results for input(s): "VITAMINB12", "FOLATE", "FERRITIN", "TIBC", "IRON", "RETICCTPCT" in the last 72 hours. Urine analysis:    Component Value Date/Time   COLORURINE RED (A) 09/07/2019 1400   APPEARANCEUR CLOUDY (A) 09/07/2019 1400   LABSPEC 1.020 09/07/2019 1400   PHURINE 7.0 09/07/2019 1400   GLUCOSEU NEGATIVE 09/07/2019 1400   HGBUR 3+ (A) 09/07/2019 1400   BILIRUBINUR NEGATIVE 01/19/2013 1022   KETONESUR NEGATIVE 09/07/2019 1400   PROTEINUR 1+ (A) 09/07/2019 1400   UROBILINOGEN 0.2 01/19/2013 1022   NITRITE NEGATIVE 01/19/2013 1022   LEUKOCYTESUR MODERATE (A) 01/19/2013 1022   Sepsis Labs: '@LABRCNTIP'$ (procalcitonin:4,lacticidven:4)  ) Recent Results (from the past 240 hour(s))  Blood culture  (routine x 2)     Status: None (Preliminary result)   Collection Time: 08/16/22  7:40 PM   Specimen: BLOOD  Result Value Ref Range Status   Specimen Description BLOOD RIGHT ANTECUBITAL  Final   Special Requests   Final    BOTTLES DRAWN AEROBIC AND ANAEROBIC Blood Culture results may not be optimal due to an inadequate volume of blood received in culture bottles   Culture   Final    NO GROWTH < 12 HOURS Performed at [facility] [address] [place],  [address]    Report Status PENDING  Incomplete  Blood culture (routine x 2)     Status: None (Preliminary result)   Collection Time: 08/16/22  7:49 PM   Specimen: BLOOD RIGHT HAND  Result Value Ref Range Status   Specimen Description BLOOD RIGHT HAND  Final   Special Requests   Final    BOTTLES DRAWN AEROBIC AND ANAEROBIC Blood Culture results may not be optimal due to an inadequate volume of blood received in culture bottles   Culture   Final    NO GROWTH < 12 HOURS Performed at [facility], [address] [place], [place] [address]    Report Status PENDING  Incomplete      Studies: No results found.  Scheduled Meds:  Continuous Infusions:   ceFAZolin (ANCEF) IV Stopped (08/17/22 0315)   heparin 1,300 Units/hr (08/17/22 0356)     LOS: 0 days     Kayleen Memos, MD [facility] Pager [phone]  If 7PM-7AM, please contact night-coverage [email or web address] Password [email or web address] 08/17/2022, 1:24 PM

## 2022-08-17 NOTE — TOC Benefit Eligibility Note (Signed)
Patient Teacher, English as a foreign language completed.    The patient is currently admitted and upon discharge could be taking Eliquis 5 mg.  The current 30 day co-pay is $40.00.   The patient is currently admitted and upon discharge could be taking Xarelto 20 mg.  The current 30 day co-pay is $40.00.   The patient is insured through [place], [place] Patient Advocate Specialist [place] Patient Advocate Team Direct Number: [phone]  Fax: [phone]

## 2022-08-17 NOTE — Progress Notes (Signed)
Bilateral lower extremity venous study completed.   Preliminary results relayed to Mickel Baas, RN.  Please see CV Procedures for preliminary results.  Farzad Tibbetts, RVT  4:07 PM 08/17/22

## 2022-08-17 NOTE — Care Management (Signed)
  Transition of Care (TOC) Screening Note   Patient Details  Name: [name] Date of Birth: [birth year]   Transition of Care [facility]) CM/SW Contact:    Bethena Roys, RN Phone Number: 08/17/2022, 3:21 PM    Transition of Care Department [facility]) has reviewed the patient and no TOC needs have been identified at this time. Patient presented for right leg pain and swelling. Initiated on IV Ancef. PT/OT to consult for recommendations. Case Manager will continue to monitor patient advancement through interdisciplinary progression rounds. If new patient transition needs arise, please place a TOC consult.

## 2022-08-17 NOTE — Progress Notes (Signed)
[place] for heparin Indication: r/o DVT  No Known Allergies  Patient Measurements: Height: '5\' 8"'$  (172.7 cm) Weight: 98.1 kg (216 lb 4.3 oz) IBW/kg (Calculated) : 63.9 Heparin Dosing Weight: 85kg  Vital Signs: Temp: 98.8 F (37.1 C) (03/01 1206) Temp Source: Oral (03/01 1206) BP: 161/75 (03/01 1206) Pulse Rate: 73 (03/01 1206)  Labs: Recent Labs    08/16/22 1939 08/17/22 0328 08/17/22 1101  HGB 12.9 12.4  --   HCT 42.5 39.1  --   PLT 212 179  --   HEPARINUNFRC  --   --  0.23*  CREATININE 0.61  --   --      Estimated Creatinine Clearance: 66.4 mL/min (by C-G formula based on SCr of 0.61 mg/dL).   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  Initial heparin level this morning slightly below goal.  No known issues with IV infusion.  No bleeding or complications noted.  Goal of Therapy:  Heparin level 0.3-0.7 units/ml Monitor platelets by anticoagulation protocol: Yes   Plan:  Increase IV heparin to 1450 units/hr. Repeat heparin level in 8 hrs. Daily heparin level and CBC.  [place] Crane, Roylene Reason, BCCP Clinical Pharmacist  08/17/2022 1:30 PM   [facility] pharmacy phone numbers are listed on [email or web address]

## 2022-08-17 NOTE — Progress Notes (Signed)
ANTICOAGULATION CONSULT NOTE  Pharmacy Consult for heparin Indication: r/o DVT  No Known Allergies  Patient Measurements: Height: '5\' 8"'$  (172.7 cm) Weight: 98.1 kg (216 lb 4.3 oz) IBW/kg (Calculated) : 63.9 Heparin Dosing Weight: 85kg  Vital Signs: Temp: 98.1 F (36.7 C) (03/01 2011) Temp Source: Oral (03/01 2011) BP: 158/60 (03/01 2011) Pulse Rate: 81 (03/01 2011)  Labs: Recent Labs    08/16/22 1939 08/17/22 0328 08/17/22 1101 08/17/22 2116  HGB 12.9 12.4  --   --   HCT 42.5 39.1  --   --   PLT 212 179  --   --   HEPARINUNFRC  --   --  0.23* 0.38  CREATININE 0.61  --   --   --      Estimated Creatinine Clearance: 66.4 mL/min (by C-G formula based on SCr of 0.61 mg/dL).   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   Cystoid macular edema of right eye 09/24/2019   Detrusor instability    GERD (gastroesophageal reflux disease)    Osteoporosis    Psoriasis    Sciatic pain    Uterine prolapse     Assessment: 82yo female c/o RLE swelling, warmth, and erythema, being tx'd for cellulitis, as precaution D-dimer was checked and found to be elevated >> to begin heparin for r/o DVT  -heparin level at goal on 1450 units/hr  Goal of Therapy:  Heparin level 0.3-0.7 units/ml Monitor platelets by anticoagulation protocol: Yes   Plan:  -Continue heparin at 1450 units/he -Daily heparin level and CBC  Hildred Laser, PharmD Clinical Pharmacist **Pharmacist phone directory can now be found on [email or web address] (PW [email or web address]).  Listed under [place].

## 2022-08-18 DIAGNOSIS — L0390 Cellulitis, unspecified: Secondary | ICD-10-CM | POA: Diagnosis not present

## 2022-08-18 LAB — CBC
HCT: 38.5 % (ref 36.0–46.0)
Hemoglobin: 12.1 g/dL (ref 12.0–15.0)
MCH: 27.5 pg (ref 26.0–34.0)
MCHC: 31.4 g/dL (ref 30.0–36.0)
MCV: 87.5 fL (ref 80.0–100.0)
Platelets: 184 10*3/uL (ref 150–400)
RBC: 4.4 MIL/uL (ref 3.87–5.11)
RDW: 13.2 % (ref 11.5–15.5)
WBC: 5.7 10*3/uL (ref 4.0–10.5)
nRBC: 0 % (ref 0.0–0.2)

## 2022-08-18 MED ORDER — CEPHALEXIN 500 MG PO CAPS: 500.0000 mg | ORAL_CAPSULE | Freq: Four times a day (QID) | ORAL | 0 refills | Status: AC

## 2022-08-18 NOTE — Evaluation (Signed)
Physical Therapy Evaluation Patient Details Name: [name] MRN: [id number] DOB: 1941/01/24 Today's Date: 08/18/2022  History of Present Illness  Pt is an 82 y.o. female admitted 2/29 with RLE cellulitis and BLE edema. PMH: recent fall (05/2022) with L rib fxs, compression fx's T8, T12, L4 all age indeterminate, anxiety, neck pain, achilles rupture, TKR x 2, revision TKR, osteoporosis, macular degeneration.   Clinical Impression  PT eval complete. PTA pt lived at home with her husband, mod I mobility household distances with RW. On eval, she required min guard assist transfers, and min guard assist ambulation 100' with RW. Edema noted BLE distally. Pt with c/o pain R foot during amb. Plan is for d/c home today. PT would benefit from HHPT to address deficits in strength, mobility, balance, and activity tolerance. Prior to previous hospitalization in Dec 2023, pt was very active and mobile, ambulating community distances with cane vs no AD. Pt would like to improve her current mobility status and return to mobility outside of the home. All further needs to be addressed by HHPT. Acute care PT signing off.        Recommendations for follow up therapy are one component of a multi-disciplinary discharge planning process, led by the attending physician.  Recommendations may be updated based on patient status, additional functional criteria and insurance authorization.  Follow Up Recommendations Home health PT      Assistance Recommended at Discharge None  Patient can return home with the following  Assistance with cooking/housework;Assist for transportation;Help with stairs or ramp for entrance;A little help with bathing/dressing/bathroom    Equipment Recommendations None recommended by PT  Recommendations for Other Services       Functional Status Assessment Patient has had a recent decline in their functional status and demonstrates the ability to make significant improvements in  function in a reasonable and predictable amount of time.     Precautions / Restrictions Precautions Precautions: Fall      Mobility  Bed Mobility Overal bed mobility: Modified Independent             General bed mobility comments: HOB elevated, increased time, +rail. Pt sleeps in lift chair at baseline    Transfers Overall transfer level: Needs assistance Equipment used: Rolling walker (2 wheels) Transfers: Sit to/from Stand Sit to Stand: Min guard           General transfer comment: min guard for safety. Increased time to power up    Ambulation/Gait Ambulation/Gait assistance: Min guard Gait Distance (Feet): 100 Feet Assistive device: Rolling walker (2 wheels) Gait Pattern/deviations: Step-through pattern, Decreased stride length Gait velocity: decreased Gait velocity interpretation: <1.31 ft/sec, indicative of household ambulator   General Gait Details: slow, steady gait with RW  Stairs            Wheelchair Mobility    Modified Rankin (Stroke Patients Only)       Balance Overall balance assessment: Needs assistance Sitting-balance support: No upper extremity supported, Feet supported Sitting balance-Leahy Scale: Good     Standing balance support: Bilateral upper extremity supported, Reliant on assistive device for balance, During functional activity Standing balance-Leahy Scale: Poor                               Pertinent Vitals/Pain Pain Assessment Pain Assessment: Faces Faces Pain Scale: Hurts little more Pain Location: RLE Pain Intervention(s): Monitored during session, Repositioned    Home Living Family/patient expects to be  discharged to:: Private residence Living Arrangements: Spouse/significant other Available Help at Discharge: Family;Available 24 hours/day Type of Home: House Home Access: Stairs to enter Entrance Stairs-Rails: Right;Left;Can reach both Entrance Stairs-Number of Steps: 2 Alternate Level  Stairs-Number of Steps: chair lift Home Layout: Two level;Able to live on main level with bedroom/bathroom Home Equipment: Shower seat;BSC/3in1;Rollator (4 wheels);Cane - single point;Rolling Walker (2 wheels) Additional Comments: [facility] stay following hospitalization in 05/2022. Discharged home 06/18/22.    Prior Function Prior Level of Function : Independent/Modified Independent;History of Falls (last six months)             Mobility Comments: amb household distances with RW. Sleeps in lift chair. ADLs Comments: primarily sponge bathes     Hand Dominance   Dominant Hand: Right    Extremity/Trunk Assessment        Lower Extremity Assessment Lower Extremity Assessment: Generalized weakness (edema noted bilat)    Cervical / Trunk Assessment Cervical / Trunk Assessment: Kyphotic  Communication   Communication: No difficulties  Cognition Arousal/Alertness: Awake/alert Behavior During Therapy: WFL for tasks assessed/performed Overall Cognitive Status: Within Functional Limits for tasks assessed                                          General Comments General comments (skin integrity, edema, etc.): VSS on RA    Exercises     Assessment/Plan    PT Assessment All further PT needs can be met in the next venue of care  PT Problem List Decreased strength;Decreased balance;Pain;Decreased mobility;Decreased activity tolerance       PT Treatment Interventions      PT Goals (Current goals can be found in the Care Plan section)  Acute Rehab PT Goals Patient Stated Goal: home PT Goal Formulation: All assessment and education complete, DC therapy    Frequency       Co-evaluation               AM-PAC PT "6 Clicks" Mobility  Outcome Measure Help needed turning from your back to your side while in a flat bed without using bedrails?: None Help needed moving from lying on your back to sitting on the side of a flat bed without using bedrails?: A  Little Help needed moving to and from a bed to a chair (including a wheelchair)?: A Little Help needed standing up from a chair using your arms (e.g., wheelchair or bedside chair)?: A Little Help needed to walk in hospital room?: A Little Help needed climbing 3-5 steps with a railing? : A Little 6 Click Score: 19    End of Session Equipment Utilized During Treatment: Gait belt Activity Tolerance: Patient tolerated treatment well Patient left: in chair;with call bell/phone within reach Nurse Communication: Mobility status PT Visit Diagnosis: Muscle weakness (generalized) (M62.81);Difficulty in walking, not elsewhere classified (R26.2)    Time: [id number] PT Time Calculation (min) (ACUTE ONLY): 22 min   Charges:   PT Evaluation $PT Eval Moderate Complexity: 1 Mod          Gloriann Loan., PT  Office # [phone]   Lorriane Shire 08/18/2022, 9:12 AM

## 2022-08-18 NOTE — TOC Initial Note (Signed)
Transition of Care [facility]) - Initial/Assessment Note    Patient Details  Name: [name] MRN: [id number] Date of Birth: 03-Jan-1941  Transition of [facility]) CM/SW Contact:    Bethena Roys, RN Phone Number: 08/18/2022, 9:19 AM  Clinical Narrative: Plan for the patient to transition home today. Patient is currently active with PT with [facility]. [facility] is aware to follow. No further needs identified.            Expected Discharge Plan: [place] Barriers to Discharge: No Barriers Identified   Patient Goals and CMS Choice Patient states their goals for this hospitalization and ongoing recovery are:: to return home.   Choice offered to / list presented to : NA (currently active with [facility].)      Expected Discharge Plan and Services In-house Referral: NA Discharge Planning Services: CM Consult   Living arrangements for the past 2 months: Single Family Home Expected Discharge Date: 08/18/22                         HH Arranged: PT HH Agency: [place] Date HH Agency Contacted: 08/18/22 Time HH Agency Contacted: [phone] Representative spoke with at [place]: Claiborne Billings  Prior Living Arrangements/Services Living arrangements for the past 2 months: [place] with:: Spouse Patient language and need for interpreter reviewed:: Yes Do you feel safe going back to the place where you live?: Yes      Need for Family Participation in Patient Care: Yes (Comment) Care giver support system in place?: Yes (comment) Current home services: DME (cane and rolling walker at home.) Criminal Activity/Legal Involvement Pertinent to Current Situation/Hospitalization: No - Comment as needed  Activities of Daily Living Home Assistive Devices/Equipment: Walker (specify type) ADL Screening (condition at time of admission) Patient's cognitive ability adequate to safely complete daily activities?: Yes Is the patient deaf or have  difficulty hearing?: No Does the patient have difficulty seeing, even when wearing glasses/contacts?: No Does the patient have difficulty concentrating, remembering, or making decisions?: No Patient able to express need for assistance with ADLs?: No Does the patient have difficulty dressing or bathing?: No Independently performs ADLs?: Yes (appropriate for developmental age) Does the patient have difficulty walking or climbing stairs?: Yes Weakness of Legs: None Weakness of Arms/Hands: None  Permission Sought/Granted Permission sought to share information with : Family Supports, Customer service manager, Case Production designer, theatre/television/film granted to share info w AGENCY: [facility]        Emotional Assessment Appearance:: Appears stated age Attitude/Demeanor/Rapport: Engaged Affect (typically observed): Appropriate Orientation: : Oriented to  Time, Oriented to Situation, Oriented to Place, Oriented to Self Alcohol / Substance Use: Not Applicable Psych Involvement: No (comment)  Admission diagnosis:  Cellulitis [L03.90] Cellulitis of right lower extremity [L03.115] Patient Active Problem List   Diagnosis Date Noted   Cellulitis 08/16/2022   Bilateral lower extremity edema 08/16/2022   GERD (gastroesophageal reflux disease) 08/16/2022   Acute back pain 05/25/2022   Recurrent falls 05/23/2022   Class 1 obesity due to excess calories with body mass index (BMI) of 30.0 to 30.9 in adult 05/23/2022   Advanced nonexudative age-related macular degeneration of right eye with subfoveal involvement 05/04/2021   Early stage nonexudative age-related macular degeneration of left eye 12/01/2020   Nuclear sclerotic cataract of left eye 02/25/2020   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   Exudative age-related macular degeneration of right eye with inactive  choroidal neovascularization ([place]) 09/24/2019   Retinal hemorrhage of right eye 09/24/2019   Retinal exudates and  deposits 09/24/2019   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   Mastodynia, female 03/19/2016   Cystocele 02/08/2014   Vaginal atrophy 01/28/2014   History of herpes simplex infection 01/28/2014   Knee pain, acute 03/05/2013   Anxiety and depression 02/05/2013   Constipation 02/05/2013   Psoriasis 02/05/2013   Failed total knee arthroplasty ([place]) 01/23/2013   Sciatic pain    Detrusor instability    Uterine prolapse    Atrophic vaginitis    Osteoporosis    PCP:  Charlane Ferretti, MD Pharmacy:   [id number] [id number]: [phone]: [phone] Social Determinants of Health (SDOH) Social History: [place] No Food Insecurity (08/17/2022)  Housing: Low Risk  (08/17/2022)  Transportation Needs: No Transportation Needs (08/17/2022)  Utilities: Not At Risk (08/17/2022)  Tobacco Use: Medium Risk (08/16/2022)   SDOH Interventions:     Readmission Risk Interventions     No data to display

## 2022-08-18 NOTE — Discharge Summary (Signed)
Physician Discharge Summary  [name] [id number] DOB: [birth year] DOA: 08/16/2022  PCP: Charlane Ferretti, MD  Admit date: 08/16/2022  Discharge date: 08/18/2022  Admitted From:Home  Disposition:  Home  Recommendations for Outpatient Follow-up:  Follow up with PCP in 1-2 weeks Continue on Keflex as prescribed for treatment of cellulitis Recommended leg elevation at home and use of compression stockings to help with bilateral lower extremity edema that may be related to venous stasis Continue other home medications as prior  Home Health: Yes with PT  Equipment/Devices: Has home walker  Discharge Condition:Stable  CODE STATUS: Full  Diet recommendation: Heart Healthy  Brief/Interim Summary:  [name] is a 82 y.o. female with medical history significant of anxiety, depression, arthritis, GERD, osteoporosis, degenerative disc disease, cervical spinal stenosis, psoriasis, history of herpes simplex infection, B12 deficiency, macular degeneration.  Admitted for back/neck pain/multiple compression fractures and rib fracture in December 2023 in the setting of recurrent falls.  Patient presents to the ED today via EMS for evaluation of right leg pain, swelling, and erythema.  She was admitted for right lower extremity cellulitis and had bilateral lower extremity edema with concern for possible DVT which was ruled out.  She was maintained on IV Ancef during her brief stay with improvement noted and she may convert back to oral antibiotics at this point with no growth on blood cultures noted.  She is in stable condition for discharge and has been seen by PT with recommendations for home health services.  Discharge Diagnoses:  Principal Problem:   Cellulitis Active Problems:   Anxiety and depression   Bilateral lower extremity edema   GERD (gastroesophageal reflux disease)  Principal discharge diagnosis: Right lower extremity cellulitis in the setting of bilateral lower  extremity edema likely related to venous stasis.  Discharge Instructions  Discharge Instructions     Diet - low sodium heart healthy   Complete by: As directed    Increase activity slowly   Complete by: As directed       Allergies as of 08/18/2022   No Known Allergies      Medication List     STOP taking these medications    oxyCODONE 5 MG immediate release tablet Commonly known as: Oxy IR/ROXICODONE       TAKE these medications    acetaminophen 325 MG tablet Commonly known as: TYLENOL Take 2 tablets (650 mg total) by mouth every 6 (six) hours as needed for mild pain (or Fever >/= 101).   CALCIUM + VITAMIN D3 PO Take 1 tablet by mouth daily.   cephALEXin 500 MG capsule Commonly known as: KEFLEX Take 1 capsule (500 mg total) by mouth 4 (four) times daily for 7 days.   cetirizine 10 MG tablet Commonly known as: ZYRTEC Take 10 mg by mouth daily as needed for allergies.   cyanocobalamin 1000 MCG tablet Take 1 tablet (1,000 mcg total) by mouth daily.   feeding supplement Liqd Take 237 mLs by mouth 2 (two) times daily between meals.   FLUoxetine 20 MG tablet Commonly known as: PROZAC Take 60 mg by mouth daily.   ibuprofen 200 MG tablet Commonly known as: ADVIL Take 400 mg by mouth daily as needed for headache, moderate pain or mild pain.   lidocaine 5 % Commonly known as: LIDODERM Place 1 patch onto the skin daily. Remove & Discard patch within 12 hours or as directed by MD What changed:  when to take this reasons to take this additional instructions  LORazepam 0.5 MG tablet Commonly known as: ATIVAN Take 1 tablet (0.5 mg total) by mouth daily as needed for anxiety or sleep.   methocarbamol 500 MG tablet Commonly known as: ROBAXIN Take 1 tablet (500 mg total) by mouth every 6 (six) hours as needed for muscle spasms.   polyethylene glycol 17 g packet Commonly known as: MIRALAX / GLYCOLAX Take 17 g by mouth 2 (two) times daily.   senna-docusate  8.6-50 MG tablet Commonly known as: Senokot-S Take 2 tablets by mouth 2 (two) times daily.   valACYclovir 500 MG tablet Commonly known as: VALTREX TAKE 1 TABLET BY MOUTH EVERY DAY What changed: when to take this        Follow-up Information     Charlane Ferretti, MD. Schedule an appointment as soon as possible for a visit in 1 week(s).   Specialty: Internal Medicine Contact information: [address] [place] [address] [phone]                No Known Allergies  Consultations: None   Procedures/Studies: VAS [place] LOWER EXTREMITY VENOUS (DVT)  Result Date: 08/17/2022  Lower Venous DVT Study Patient Name:  [name]  Date of Exam:   08/17/2022 Medical Rec #: [id number]           Accession #:    [id number] Date of Birth: [birth year]           Patient Gender: F Patient Age:   26 years Exam Location:  [facility] Procedure:      VAS [place] LOWER EXTREMITY VENOUS (DVT) Referring Phys: Wandra Feinstein RATHORE --------------------------------------------------------------------------------  Indications: Pain, Swelling, and Cellulitis, Elevated D-Dimer.  Anticoagulation: Heparin. Comparison Study: No prior study. Performing Technologist: McKayla Maag RVT, [place]  Examination Guidelines: A complete evaluation includes B-mode imaging, spectral Doppler, color Doppler, and power Doppler as needed of all accessible portions of each vessel. Bilateral testing is considered an integral part of a complete examination. Limited examinations for reoccurring indications may be performed as noted. The reflux portion of the exam is performed with the patient in reverse Trendelenburg.  +---------+---------------+---------+-----------+----------+--------------+ RIGHT    CompressibilityPhasicitySpontaneityPropertiesThrombus Aging +---------+---------------+---------+-----------+----------+--------------+ CFV      Full           Yes      Yes                                  +---------+---------------+---------+-----------+----------+--------------+ SFJ      Full                                                        +---------+---------------+---------+-----------+----------+--------------+ FV Prox  Full                                                        +---------+---------------+---------+-----------+----------+--------------+ FV Mid   Full                                                        +---------+---------------+---------+-----------+----------+--------------+  FV DistalFull                                                        +---------+---------------+---------+-----------+----------+--------------+ PFV      Full                                                        +---------+---------------+---------+-----------+----------+--------------+ POP      Full           Yes      Yes                                 +---------+---------------+---------+-----------+----------+--------------+ PTV      Full                                                        +---------+---------------+---------+-----------+----------+--------------+ PERO     Full                                                        +---------+---------------+---------+-----------+----------+--------------+   +---------+---------------+---------+-----------+----------+--------------+ LEFT     CompressibilityPhasicitySpontaneityPropertiesThrombus Aging +---------+---------------+---------+-----------+----------+--------------+ CFV      Full           Yes      Yes                                 +---------+---------------+---------+-----------+----------+--------------+ SFJ      Full                                                        +---------+---------------+---------+-----------+----------+--------------+ FV Prox  Full                                                         +---------+---------------+---------+-----------+----------+--------------+ FV Mid   Full                                                        +---------+---------------+---------+-----------+----------+--------------+ FV DistalFull                                                        +---------+---------------+---------+-----------+----------+--------------+   PFV      Full                                                        +---------+---------------+---------+-----------+----------+--------------+ POP      Full           Yes      Yes                                 +---------+---------------+---------+-----------+----------+--------------+ PTV      Full                                                        +---------+---------------+---------+-----------+----------+--------------+ PERO     Full                                                        +---------+---------------+---------+-----------+----------+--------------+     Summary: BILATERAL: - No evidence of deep vein thrombosis seen in the lower extremities, bilaterally. - No evidence of superficial venous thrombosis in the lower extremities, bilaterally. -No evidence of popliteal cyst, bilaterally.   *See table(s) above for measurements and observations. Electronically signed by Monica Martinez MD on 08/17/2022 at 4:37:28 PM.    Final      Discharge Exam: Vitals:   08/18/22 0004 08/18/22 0414  BP: (!) 160/65 (!) 122/104  Pulse: 84 77  Resp: 19 18  Temp: 98.8 F (37.1 C) 98.1 F (36.7 C)  SpO2: 92% 95%   Vitals:   08/17/22 1651 08/17/22 2011 08/18/22 0004 08/18/22 0414  BP: (!) 160/65 (!) 158/60 (!) 160/65 (!) 122/104  Pulse: 77 81 84 77  Resp: '20 20 19 18  '$ Temp: 98.9 F (37.2 C) 98.1 F (36.7 C) 98.8 F (37.1 C) 98.1 F (36.7 C)  TempSrc: Oral Oral Oral Oral  SpO2: 96% 93% 92% 95%  Weight:      Height:        General: Pt is alert, awake, not in acute distress Cardiovascular:  RRR, S1/S2 +, no rubs, no gallops Respiratory: CTA bilaterally, no wheezing, no rhonchi Abdominal: Soft, NT, ND, bowel sounds + Extremities: Bilateral nonpitting lower extremity edema with mild erythema to right lower extremity.    The results of significant diagnostics from this hospitalization (including imaging, microbiology, ancillary and laboratory) are listed below for reference.     Microbiology: Recent Results (from the past 240 hour(s))  Blood culture (routine x 2)     Status: None (Preliminary result)   Collection Time: 08/16/22  7:40 PM   Specimen: BLOOD  Result Value Ref Range Status   Specimen Description BLOOD RIGHT ANTECUBITAL  Final   Special Requests   Final    BOTTLES DRAWN AEROBIC AND ANAEROBIC Blood Culture results may not be optimal due to an inadequate volume of blood received in culture bottles   Culture   Final    NO GROWTH < 12 HOURS Performed at [facility]  Lab, [address] [place], [place] [address]    Report Status PENDING  Incomplete  Blood culture (routine x 2)     Status: None (Preliminary result)   Collection Time: 08/16/22  7:49 PM   Specimen: BLOOD RIGHT HAND  Result Value Ref Range Status   Specimen Description BLOOD RIGHT HAND  Final   Special Requests   Final    BOTTLES DRAWN AEROBIC AND ANAEROBIC Blood Culture results may not be optimal due to an inadequate volume of blood received in culture bottles   Culture   Final    NO GROWTH < 12 HOURS Performed at [facility], [place] [address] [place], [place] [address]    Report Status PENDING  Incomplete     Labs: BNP (last 3 results) Recent Labs    08/17/22 0328  BNP 0000000*   Basic Metabolic Panel: Recent Labs  Lab 08/16/22 1939  NA 140  K 3.8  CL 104  CO2 29  GLUCOSE 97  BUN 16  CREATININE 0.61  CALCIUM 8.9   Liver Function Tests: No results for input(s): "AST", "ALT", "ALKPHOS", "BILITOT", "PROT", "ALBUMIN" in the last 168 hours. No results for input(s):  "LIPASE", "AMYLASE" in the last 168 hours. No results for input(s): "AMMONIA" in the last 168 hours. CBC: Recent Labs  Lab 08/16/22 1939 08/17/22 0328 08/18/22 0331  WBC 6.0 6.1 5.7  NEUTROABS 3.9  --   --   HGB 12.9 12.4 12.1  HCT 42.5 39.1 38.5  MCV 90.2 87.9 87.5  PLT 212 179 184   Cardiac Enzymes: No results for input(s): "CKTOTAL", "CKMB", "CKMBINDEX", "TROPONINI" in the last 168 hours. BNP: Invalid input(s): "POCBNP" CBG: No results for input(s): "GLUCAP" in the last 168 hours. D-Dimer Recent Labs    08/17/22 0141  DDIMER 1.33*   Hgb A1c No results for input(s): "HGBA1C" in the last 72 hours. Lipid Profile No results for input(s): "CHOL", "HDL", "LDLCALC", "TRIG", "CHOLHDL", "LDLDIRECT" in the last 72 hours. Thyroid function studies No results for input(s): "TSH", "T4TOTAL", "T3FREE", "THYROIDAB" in the last 72 hours.  Invalid input(s): "FREET3" Anemia work up No results for input(s): "VITAMINB12", "FOLATE", "FERRITIN", "TIBC", "IRON", "RETICCTPCT" in the last 72 hours. Urinalysis    Component Value Date/Time   COLORURINE RED (A) 09/07/2019 1400   APPEARANCEUR CLOUDY (A) 09/07/2019 1400   LABSPEC 1.020 09/07/2019 1400   PHURINE 7.0 09/07/2019 1400   GLUCOSEU NEGATIVE 09/07/2019 1400   HGBUR 3+ (A) 09/07/2019 1400   BILIRUBINUR NEGATIVE 01/19/2013 1022   KETONESUR NEGATIVE 09/07/2019 1400   PROTEINUR 1+ (A) 09/07/2019 1400   UROBILINOGEN 0.2 01/19/2013 1022   NITRITE NEGATIVE 01/19/2013 1022   LEUKOCYTESUR MODERATE (A) 01/19/2013 1022   Sepsis Labs Recent Labs  Lab 08/16/22 1939 08/17/22 0328 08/18/22 0331  WBC 6.0 6.1 5.7   Microbiology Recent Results (from the past 240 hour(s))  Blood culture (routine x 2)     Status: None (Preliminary result)   Collection Time: 08/16/22  7:40 PM   Specimen: BLOOD  Result Value Ref Range Status   Specimen Description BLOOD RIGHT ANTECUBITAL  Final   Special Requests   Final    BOTTLES DRAWN AEROBIC AND  ANAEROBIC Blood Culture results may not be optimal due to an inadequate volume of blood received in culture bottles   Culture   Final    NO GROWTH < 12 HOURS Performed at [facility], [place] [address] [place], [place] [address]    Report Status  PENDING  Incomplete  Blood culture (routine x 2)     Status: None (Preliminary result)   Collection Time: 08/16/22  7:49 PM   Specimen: BLOOD RIGHT HAND  Result Value Ref Range Status   Specimen Description BLOOD RIGHT HAND  Final   Special Requests   Final    BOTTLES DRAWN AEROBIC AND ANAEROBIC Blood Culture results may not be optimal due to an inadequate volume of blood received in culture bottles   Culture   Final    NO GROWTH < 12 HOURS Performed at [facility], [place] [address], [place], [place] [address]    Report Status PENDING  Incomplete     Time coordinating discharge: 35 minutes  SIGNED:   Rodena Goldmann, DO [facility] 08/18/2022, 8:39 AM  If 7PM-7AM, please contact night-coverage [email or web address]

## 2022-08-18 NOTE — Evaluation (Signed)
Occupational Therapy Evaluation Patient Details Name: [name] MRN: [id number] DOB: [birth year] Today's Date: 08/18/2022   History of Present Illness Pt is an 82 y.o. female admitted 2/29 with RLE cellulitis and BLE edema. PMH: recent fall (05/2022) with L rib fxs, compression fx's T8, T12, L4 all age indeterminate, anxiety, neck pain, achilles rupture, TKR x 2, revision TKR, osteoporosis, macular degeneration.   Clinical Impression   Pt. Was seen for skilled OT to maximize I and safety with ADLs and mobility. Pt. Has decreased ability with sit to stand from various surfaces. Pt. Is AMB in room with rolling walker at s to Min guard assist level. Pt. Is requiring assist with adls. Pt. With be dc home with husband who can assist with care. Pt. Does want [place] services to regain lost function. Pt. Is dc today so no further acute OT     Recommendations for follow up therapy are one component of a multi-disciplinary discharge planning process, led by the attending physician.  Recommendations may be updated based on patient status, additional functional criteria and insurance authorization.   Follow Up Recommendations  Home health OT     Assistance Recommended at Discharge Intermittent Supervision/Assistance  Patient can return home with the following A little help with walking and/or transfers;A little help with bathing/dressing/bathroom;Assistance with cooking/housework    Functional Status Assessment  Patient has had a recent decline in their functional status and demonstrates the ability to make significant improvements in function in a reasonable and predictable amount of time.  Equipment Recommendations  None recommended by OT    Recommendations for Other Services       Precautions / Restrictions Precautions Precautions: Fall Restrictions Weight Bearing Restrictions: No      Mobility Bed Mobility                    Transfers Overall transfer level: Needs  assistance Equipment used: Rolling walker (2 wheels) Transfers: Sit to/from Stand Sit to Stand: Min guard           General transfer comment: cues for proper hand placement.      Balance                                           ADL either performed or assessed with clinical judgement   ADL Overall ADL's : Needs assistance/impaired Eating/Feeding: Independent   Grooming: Wash/dry hands;Wash/dry face;Set up   Upper Body Bathing: Set up;Sitting   Lower Body Bathing: Minimal assistance;Sit to/from stand   Upper Body Dressing : Set up;Sitting   Lower Body Dressing: Minimal assistance;Sit to/from stand   Toilet Transfer: Designer, fashion/clothing and Hygiene: Min guard       Functional mobility during ADLs: Min guard;Rolling walker (2 wheels)       Vision Baseline Vision/History: 1 Wears glasses;4 Cataracts;6 Macular Degeneration Ability to See in Adequate Light: 1 Impaired Patient Visual Report: No change from baseline       Perception     Praxis      Pertinent Vitals/Pain Pain Assessment Pain Assessment: No/denies pain     Hand Dominance Right   Extremity/Trunk Assessment Upper Extremity Assessment Upper Extremity Assessment: Overall WFL for tasks assessed   Lower Extremity Assessment Lower Extremity Assessment: Defer to PT evaluation   Cervical / Trunk Assessment Cervical / Trunk Assessment: Kyphotic   Communication Communication  Communication: No difficulties   Cognition Arousal/Alertness: Awake/alert Behavior During Therapy: WFL for tasks assessed/performed Overall Cognitive Status: Within Functional Limits for tasks assessed                                       General Comments  VSS on RA    Exercises     Shoulder Instructions      Home Living Family/patient expects to be discharged to:: Private residence Living Arrangements: Spouse/significant other Available Help at  Discharge: Family;Available 24 hours/day Type of Home: House Home Access: Stairs to enter [employer] of Steps: 2 Entrance Stairs-Rails: Right;Left;Can reach both Home Layout: Two level;Able to live on main level with bedroom/bathroom Alternate Level Stairs-Number of Steps: chair lift   Bathroom Shower/Tub: Occupational psychologist: Handicapped height Bathroom Accessibility: Yes   Home Equipment: Curator (4 wheels);Cane - single point;Rolling Walker (2 wheels)   Additional Comments: [facility] stay following hospitalization in 05/2022. Discharged home 06/18/22.      Prior Functioning/Environment Prior Level of Function : Independent/Modified Independent;History of Falls (last six months)             Mobility Comments: amb household distances with RW. Sleeps in lift chair. ADLs Comments: primarily sponge bathes        OT Problem List: Decreased strength;Decreased activity tolerance;Impaired balance (sitting and/or standing);Impaired vision/perception;Decreased knowledge of use of DME or AE      OT Treatment/Interventions: Self-care/ADL training;DME and/or AE instruction;Therapeutic activities    OT Goals(Current goals can be found in the care plan section) Acute Rehab OT Goals Patient Stated Goal: go home and be able to care for myself OT Goal Formulation: With patient  OT Frequency:  (pt. is dc today.)    Co-evaluation              AM-PAC OT "6 Clicks" Daily Activity     Outcome Measure Help from another person eating meals?: None Help from another person taking care of personal grooming?: A Little Help from another person toileting, which includes using toliet, bedpan, or urinal?: A Little Help from another person bathing (including washing, rinsing, drying)?: A Little Help from another person to put on and taking off regular upper body clothing?: A Little Help from another person to put on and taking off regular lower body  clothing?: A Little 6 Click Score: 19   End of Session Equipment Utilized During Treatment: Rolling walker (2 wheels) Nurse Communication: Other (comment) (requested leads to be taken off secondary she is going home.)  Activity Tolerance: Patient tolerated treatment well Patient left: in chair  OT Visit Diagnosis: Unsteadiness on feet (R26.81)                Time: [id number] OT Time Calculation (min): 41 min Charges:  OT General Charges $OT Visit: 1 Visit OT Evaluation $OT Eval Moderate Complexity: 1 Mod OT Treatments $Self Care/Home Management : 8-22 mins  Reece Packer OT/L   Ellorie Kindall 08/18/2022, 10:40 AM

## 2022-08-21 LAB — CULTURE, BLOOD (ROUTINE X 2)
Culture: NO GROWTH
Culture: NO GROWTH

## 2022-08-27 DIAGNOSIS — R030 Elevated blood-pressure reading, without diagnosis of hypertension: Secondary | ICD-10-CM | POA: Diagnosis not present

## 2022-08-27 DIAGNOSIS — I878 Other specified disorders of veins: Secondary | ICD-10-CM | POA: Diagnosis not present

## 2022-08-27 DIAGNOSIS — I872 Venous insufficiency (chronic) (peripheral): Secondary | ICD-10-CM | POA: Diagnosis not present

## 2022-08-27 DIAGNOSIS — L0390 Cellulitis, unspecified: Secondary | ICD-10-CM | POA: Diagnosis not present

## 2022-08-27 DIAGNOSIS — R609 Edema, unspecified: Secondary | ICD-10-CM | POA: Diagnosis not present

## 2022-08-28 DIAGNOSIS — M47816 Spondylosis without myelopathy or radiculopathy, lumbar region: Secondary | ICD-10-CM | POA: Diagnosis not present

## 2022-08-28 DIAGNOSIS — M4316 Spondylolisthesis, lumbar region: Secondary | ICD-10-CM | POA: Diagnosis not present

## 2022-08-28 DIAGNOSIS — M160 Bilateral primary osteoarthritis of hip: Secondary | ICD-10-CM | POA: Diagnosis not present

## 2022-08-28 DIAGNOSIS — L03115 Cellulitis of right lower limb: Secondary | ICD-10-CM | POA: Diagnosis not present

## 2022-08-28 DIAGNOSIS — M1710 Unilateral primary osteoarthritis, unspecified knee: Secondary | ICD-10-CM | POA: Diagnosis not present

## 2022-08-28 DIAGNOSIS — F419 Anxiety disorder, unspecified: Secondary | ICD-10-CM | POA: Diagnosis not present

## 2022-08-28 DIAGNOSIS — M48061 Spinal stenosis, lumbar region without neurogenic claudication: Secondary | ICD-10-CM | POA: Diagnosis not present

## 2022-08-28 DIAGNOSIS — M069 Rheumatoid arthritis, unspecified: Secondary | ICD-10-CM | POA: Diagnosis not present

## 2022-08-28 DIAGNOSIS — M810 Age-related osteoporosis without current pathological fracture: Secondary | ICD-10-CM | POA: Diagnosis not present

## 2022-08-30 ENCOUNTER — Ambulatory Visit: Payer: Medicare PPO | Admitting: Nurse Practitioner

## 2022-09-03 ENCOUNTER — Ambulatory Visit: Payer: Medicare PPO | Admitting: Nurse Practitioner

## 2022-09-03 ENCOUNTER — Encounter: Payer: Self-pay | Admitting: Nurse Practitioner

## 2022-09-03 VITALS — BP 112/64 | HR 78 | Wt 206.0 lb

## 2022-09-03 DIAGNOSIS — N813 Complete uterovaginal prolapse: Secondary | ICD-10-CM

## 2022-09-03 DIAGNOSIS — Z4689 Encounter for fitting and adjustment of other specified devices: Secondary | ICD-10-CM

## 2022-09-03 NOTE — Progress Notes (Signed)
   Acute Office Visit  Subjective:    Patient ID: [name], female    DOB: 1940/09/16, 82 y.o.   MRN: [id number]   HPI 82 y.o. presents today for pessary maintenance. Last seen for this was in May 2023. Gelhorn in place with good management. Unable to remove herself. Likes to keep out for a few days and will insert herself. Limited mobility. Denies vaginal or urinary symptoms.    Review of Systems  Constitutional: Negative.   Genitourinary: Negative.        Objective:    Physical Exam Constitutional:      Appearance: Normal appearance.  Genitourinary:    General: Normal vulva.     Vagina: Normal.     Cervix: Normal.     Uterus: With uterine prolapse.      Comments: Gelhorn grasped with ring forceps, suction removed with index finger and pessary removed with ease    BP 112/64   Pulse 78   Wt 206 lb (93.4 kg)   SpO2 100%   BMI 31.32 kg/m  Wt Readings from Last 3 Encounters:  09/03/22 206 lb (93.4 kg)  08/17/22 216 lb 4.3 oz (98.1 kg)  05/22/22 200 lb (90.7 kg)        Patient informed chaperone available to be present for breast and/or pelvic exam. Patient has requested no chaperone to be present. Patient has been advised what will be completed during breast and pelvic exam.   Assessment & Plan:   Problem List Items Addressed This Visit   None Visit Diagnoses     Encounter for pessary maintenance    -  Primary   Complete uterine prolapse          Plan: Gelhorn removed with ease, cleaned and provided to patient in biohazard bag. Tolerated well. Prefers to keep out for a few days and insert herself. Return in 6 months for maintenance.      Tamela Gammon DNP, 2:02 PM 09/03/2022

## 2022-09-13 ENCOUNTER — Encounter (INDEPENDENT_AMBULATORY_CARE_PROVIDER_SITE_OTHER): Payer: Medicare PPO | Admitting: Ophthalmology

## 2022-09-17 DIAGNOSIS — H353213 Exudative age-related macular degeneration, right eye, with inactive scar: Secondary | ICD-10-CM | POA: Diagnosis not present

## 2022-09-25 DIAGNOSIS — M8589 Other specified disorders of bone density and structure, multiple sites: Secondary | ICD-10-CM | POA: Diagnosis not present

## 2022-09-25 DIAGNOSIS — Z1231 Encounter for screening mammogram for malignant neoplasm of breast: Secondary | ICD-10-CM | POA: Diagnosis not present

## 2022-10-11 ENCOUNTER — Other Ambulatory Visit (HOSPITAL_COMMUNITY): Payer: Self-pay | Admitting: Internal Medicine

## 2022-10-11 DIAGNOSIS — I7090 Unspecified atherosclerosis: Secondary | ICD-10-CM | POA: Diagnosis not present

## 2022-10-11 DIAGNOSIS — M8589 Other specified disorders of bone density and structure, multiple sites: Secondary | ICD-10-CM | POA: Diagnosis not present

## 2022-10-11 DIAGNOSIS — M169 Osteoarthritis of hip, unspecified: Secondary | ICD-10-CM | POA: Diagnosis not present

## 2022-10-11 DIAGNOSIS — M4696 Unspecified inflammatory spondylopathy, lumbar region: Secondary | ICD-10-CM | POA: Diagnosis not present

## 2022-10-11 DIAGNOSIS — E559 Vitamin D deficiency, unspecified: Secondary | ICD-10-CM | POA: Diagnosis not present

## 2022-10-11 DIAGNOSIS — I739 Peripheral vascular disease, unspecified: Secondary | ICD-10-CM | POA: Diagnosis not present

## 2022-10-11 DIAGNOSIS — M542 Cervicalgia: Secondary | ICD-10-CM | POA: Diagnosis not present

## 2022-10-11 DIAGNOSIS — R269 Unspecified abnormalities of gait and mobility: Secondary | ICD-10-CM | POA: Diagnosis not present

## 2022-10-11 DIAGNOSIS — Z9989 Dependence on other enabling machines and devices: Secondary | ICD-10-CM | POA: Diagnosis not present

## 2022-10-11 DIAGNOSIS — F419 Anxiety disorder, unspecified: Secondary | ICD-10-CM | POA: Diagnosis not present

## 2022-10-20 IMAGING — CT CT HEAD W/O CM
3 series · 16 of 47 positions shown, 19 images · non-contrast
Comparison: Head CT 07/14/2007

CLINICAL DATA: Fall, head trauma

EXAM:
CT HEAD WITHOUT CONTRAST
TECHNIQUE: Contiguous axial images were obtained from the base of the skull
through the vertex without intravenous contrast.

[Series 2: head wo · axial · 0.47mm/px · z∈[+1524,+1669]mm · 10 of 35 slices shown, 13 images]
[im 3/35  brain]
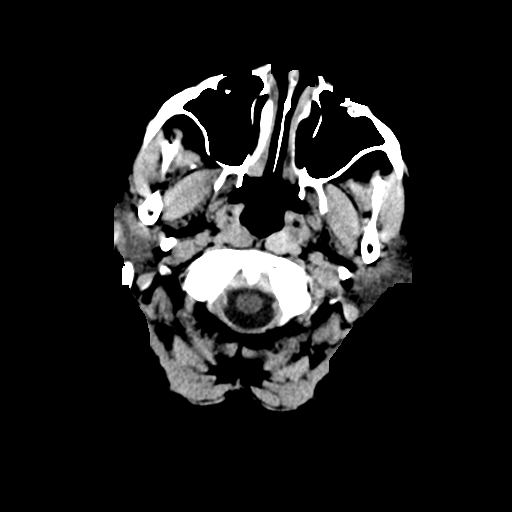
[im 3/35  bone]
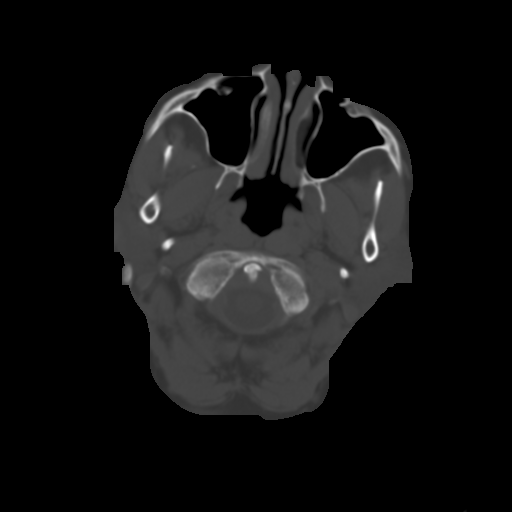
[im 6/35  brain]
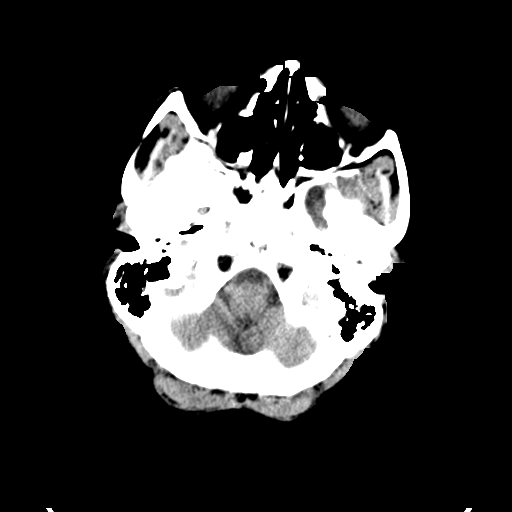
[im 10/35  brain]
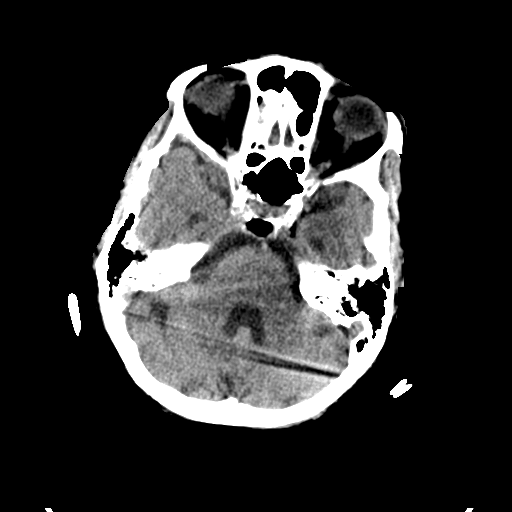
[im 12/35  brain]
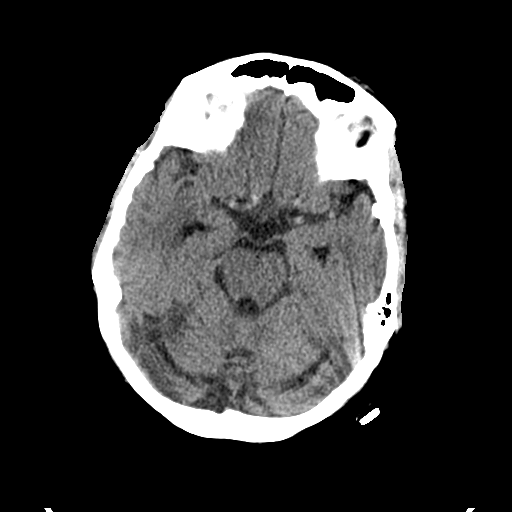
[im 16/35  brain]
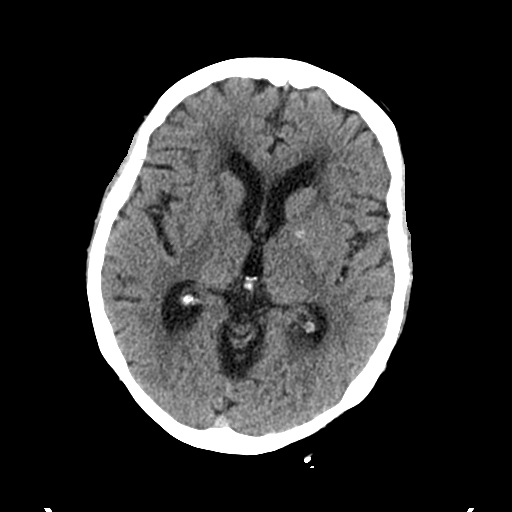
[im 16/35  bone]
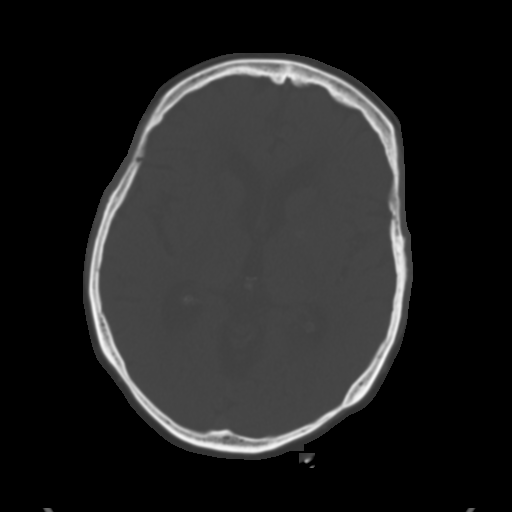
[im 19/35  brain]
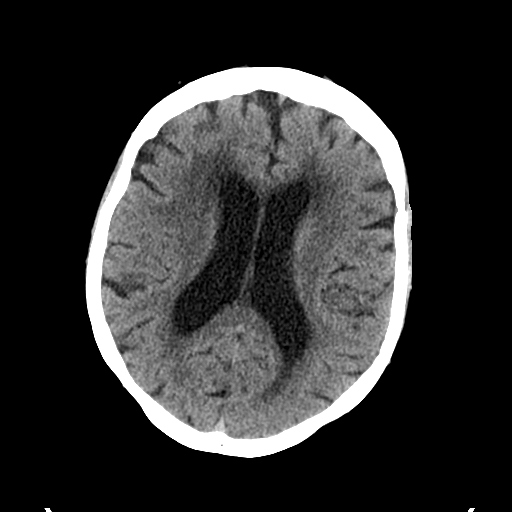
[im 23/35  brain]
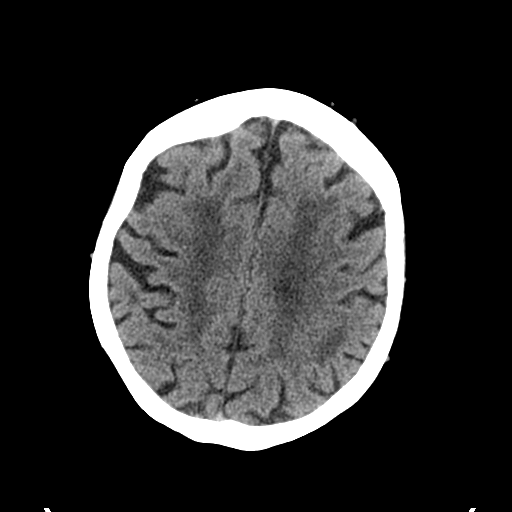
[im 26/35  brain]
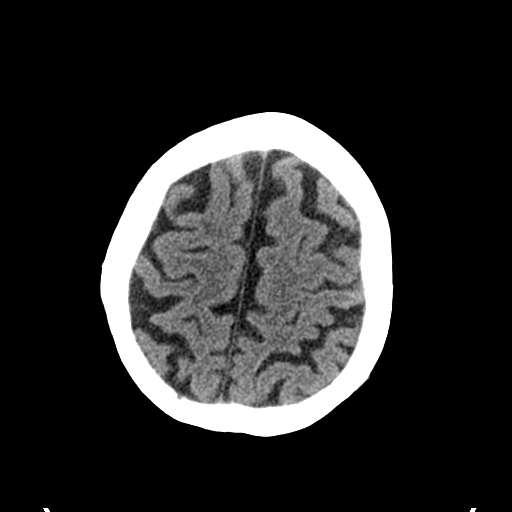
[im 29/35  brain]
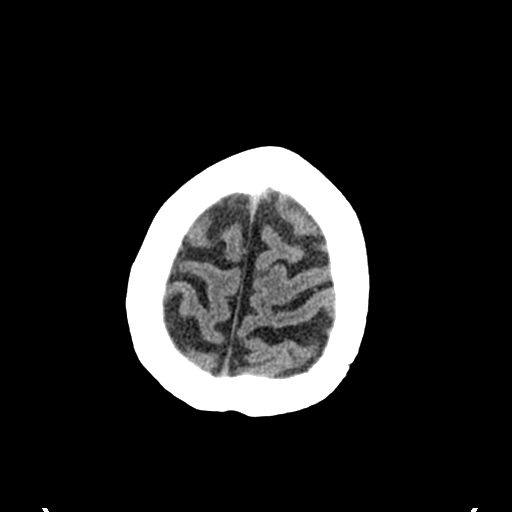
[im 29/35  bone]
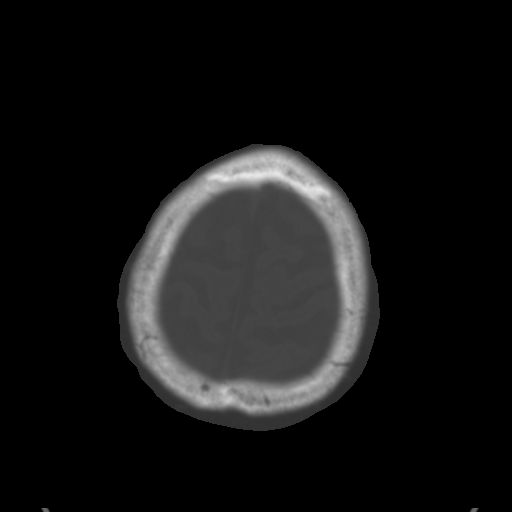
[im 32/35  brain]
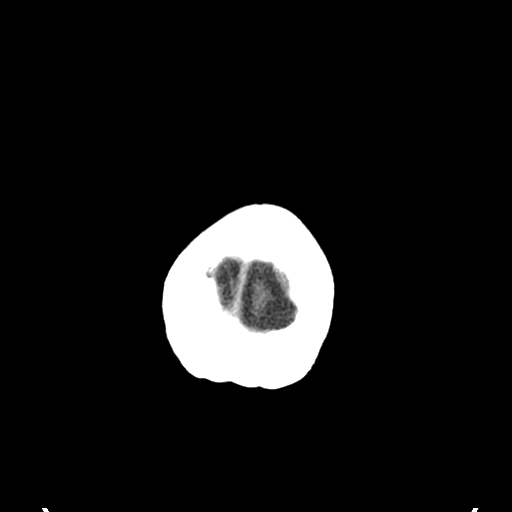

[Series 4: coronal soft tissue · coronal · 0.35mm/px · 3 of 75 slices shown]
[im 25/75  brain]
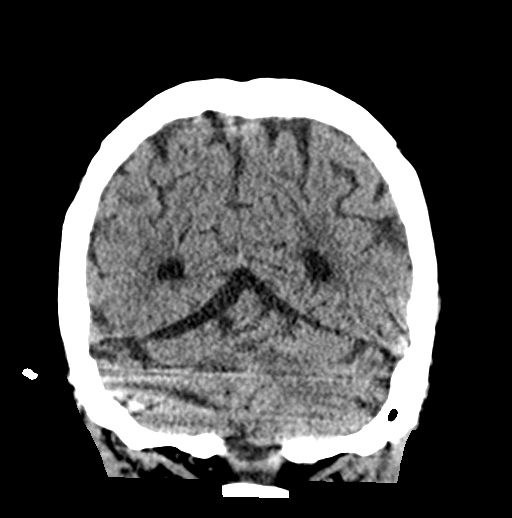
[im 33/75  brain]
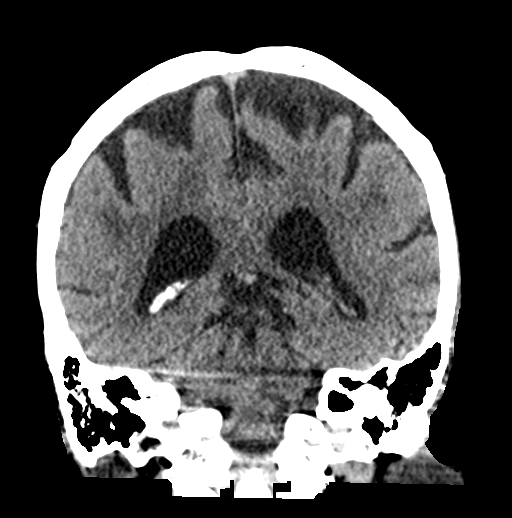
[im 42/75  brain]
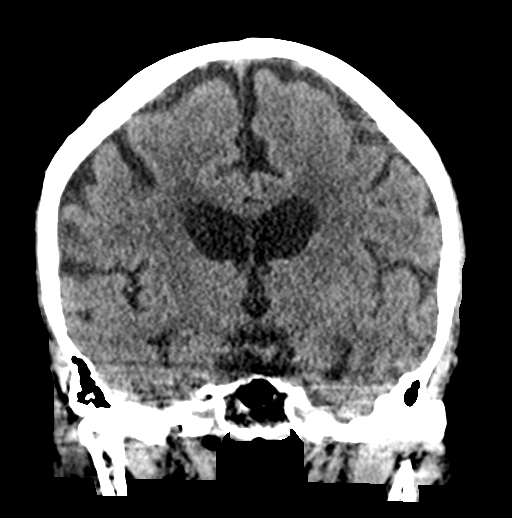

[Series 5: sagittal soft tissue · sagittal · 0.35mm/px · 3 of 66 slices shown]
[im 22/66  brain]
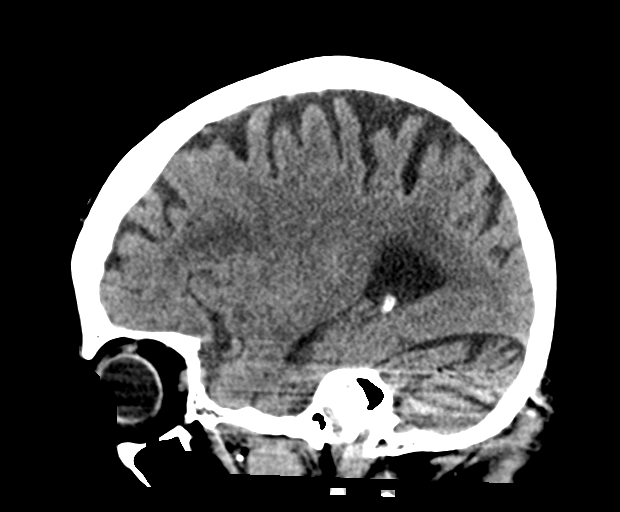
[im 33/66  brain]
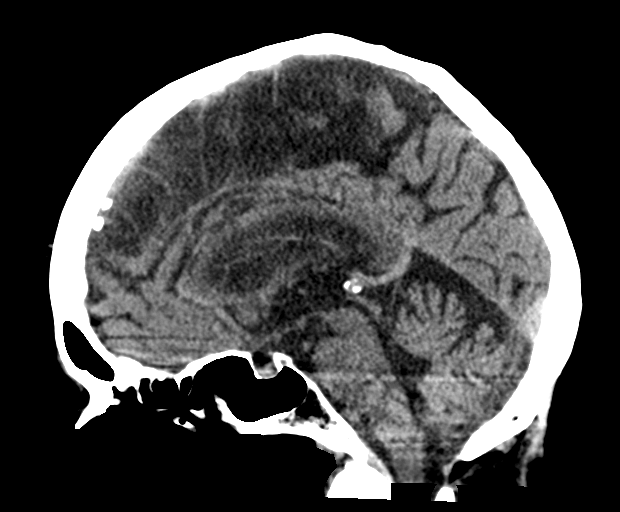
[im 44/66  brain]
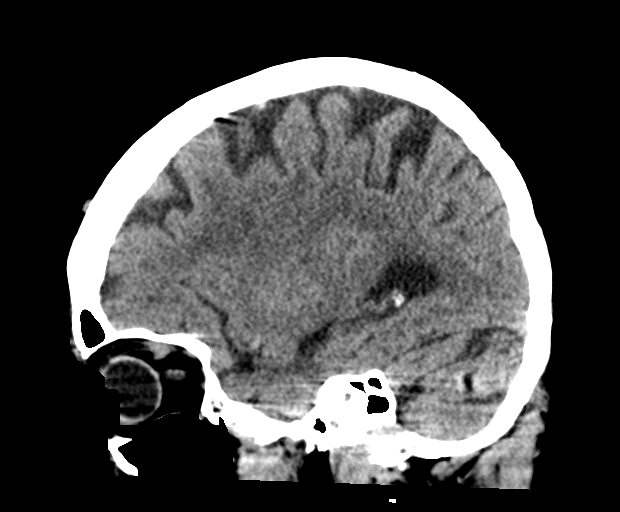

[16 of 47 positions shown; findings below may reference images not displayed]

FINDINGS: Brain: There is no evidence of acute intracranial hemorrhage,
extra-axial fluid collection, or acute infarct.

There is mild global parenchymal volume loss with commensurate
enlargement of the ventricular system. Patchy hypodensity in the
subcortical and periventricular white matter likely reflects sequela
of chronic white matter microangiopathy.

There is no solid mass lesion.  There is no midline shift.

Vascular: There is calcification of the bilateral cavernous ICAs.

Skull: Normal. Negative for fracture or focal lesion.

Sinuses/Orbits: The imaged paranasal sinuses are clear. A right lens
implant is noted. The globes and orbits are otherwise unremarkable.

Other: None.
IMPRESSION: 1. No acute intracranial pathology.
2. Mild chronic white matter microangiopathy and global parenchymal
volume loss. Cerebral Atrophy ([id number]).

## 2022-10-24 DIAGNOSIS — M47816 Spondylosis without myelopathy or radiculopathy, lumbar region: Secondary | ICD-10-CM | POA: Diagnosis not present

## 2022-10-24 DIAGNOSIS — M810 Age-related osteoporosis without current pathological fracture: Secondary | ICD-10-CM | POA: Diagnosis not present

## 2022-10-24 DIAGNOSIS — M170 Bilateral primary osteoarthritis of knee: Secondary | ICD-10-CM | POA: Diagnosis not present

## 2022-10-24 DIAGNOSIS — I739 Peripheral vascular disease, unspecified: Secondary | ICD-10-CM | POA: Diagnosis not present

## 2022-10-24 DIAGNOSIS — M8589 Other specified disorders of bone density and structure, multiple sites: Secondary | ICD-10-CM | POA: Diagnosis not present

## 2022-10-24 DIAGNOSIS — M48061 Spinal stenosis, lumbar region without neurogenic claudication: Secondary | ICD-10-CM | POA: Diagnosis not present

## 2022-10-24 DIAGNOSIS — M4696 Unspecified inflammatory spondylopathy, lumbar region: Secondary | ICD-10-CM | POA: Diagnosis not present

## 2022-10-24 DIAGNOSIS — M542 Cervicalgia: Secondary | ICD-10-CM | POA: Diagnosis not present

## 2022-10-24 DIAGNOSIS — M160 Bilateral primary osteoarthritis of hip: Secondary | ICD-10-CM | POA: Diagnosis not present

## 2022-10-25 ENCOUNTER — Other Ambulatory Visit: Payer: Self-pay | Admitting: Nurse Practitioner

## 2022-10-25 DIAGNOSIS — Z9189 Other specified personal risk factors, not elsewhere classified: Secondary | ICD-10-CM

## 2022-10-25 NOTE — Telephone Encounter (Signed)
RF request for valacyclovir 500mg . Last ov 09/03/22. Sent to provider for approval/denial.

## 2022-11-01 ENCOUNTER — Ambulatory Visit (HOSPITAL_COMMUNITY): Payer: Medicare PPO | Attending: Internal Medicine

## 2022-11-01 ENCOUNTER — Encounter (HOSPITAL_COMMUNITY): Payer: Self-pay

## 2022-11-01 DIAGNOSIS — I739 Peripheral vascular disease, unspecified: Secondary | ICD-10-CM | POA: Diagnosis not present

## 2022-11-01 DIAGNOSIS — M810 Age-related osteoporosis without current pathological fracture: Secondary | ICD-10-CM | POA: Diagnosis not present

## 2022-11-01 DIAGNOSIS — M47816 Spondylosis without myelopathy or radiculopathy, lumbar region: Secondary | ICD-10-CM | POA: Diagnosis not present

## 2022-11-01 DIAGNOSIS — M160 Bilateral primary osteoarthritis of hip: Secondary | ICD-10-CM | POA: Diagnosis not present

## 2022-11-01 DIAGNOSIS — M48061 Spinal stenosis, lumbar region without neurogenic claudication: Secondary | ICD-10-CM | POA: Diagnosis not present

## 2022-11-01 DIAGNOSIS — M170 Bilateral primary osteoarthritis of knee: Secondary | ICD-10-CM | POA: Diagnosis not present

## 2022-11-01 DIAGNOSIS — M8589 Other specified disorders of bone density and structure, multiple sites: Secondary | ICD-10-CM | POA: Diagnosis not present

## 2022-11-01 DIAGNOSIS — M542 Cervicalgia: Secondary | ICD-10-CM | POA: Diagnosis not present

## 2022-11-01 DIAGNOSIS — M4696 Unspecified inflammatory spondylopathy, lumbar region: Secondary | ICD-10-CM | POA: Diagnosis not present

## 2022-11-05 DIAGNOSIS — M170 Bilateral primary osteoarthritis of knee: Secondary | ICD-10-CM | POA: Diagnosis not present

## 2022-11-05 DIAGNOSIS — M160 Bilateral primary osteoarthritis of hip: Secondary | ICD-10-CM | POA: Diagnosis not present

## 2022-11-05 DIAGNOSIS — M8589 Other specified disorders of bone density and structure, multiple sites: Secondary | ICD-10-CM | POA: Diagnosis not present

## 2022-11-05 DIAGNOSIS — M4696 Unspecified inflammatory spondylopathy, lumbar region: Secondary | ICD-10-CM | POA: Diagnosis not present

## 2022-11-05 DIAGNOSIS — M48061 Spinal stenosis, lumbar region without neurogenic claudication: Secondary | ICD-10-CM | POA: Diagnosis not present

## 2022-11-05 DIAGNOSIS — M47816 Spondylosis without myelopathy or radiculopathy, lumbar region: Secondary | ICD-10-CM | POA: Diagnosis not present

## 2022-11-05 DIAGNOSIS — M542 Cervicalgia: Secondary | ICD-10-CM | POA: Diagnosis not present

## 2022-11-05 DIAGNOSIS — I739 Peripheral vascular disease, unspecified: Secondary | ICD-10-CM | POA: Diagnosis not present

## 2022-11-05 DIAGNOSIS — M810 Age-related osteoporosis without current pathological fracture: Secondary | ICD-10-CM | POA: Diagnosis not present

## 2022-11-08 DIAGNOSIS — M48061 Spinal stenosis, lumbar region without neurogenic claudication: Secondary | ICD-10-CM | POA: Diagnosis not present

## 2022-11-08 DIAGNOSIS — I739 Peripheral vascular disease, unspecified: Secondary | ICD-10-CM | POA: Diagnosis not present

## 2022-11-08 DIAGNOSIS — M4696 Unspecified inflammatory spondylopathy, lumbar region: Secondary | ICD-10-CM | POA: Diagnosis not present

## 2022-11-08 DIAGNOSIS — M160 Bilateral primary osteoarthritis of hip: Secondary | ICD-10-CM | POA: Diagnosis not present

## 2022-11-08 DIAGNOSIS — M542 Cervicalgia: Secondary | ICD-10-CM | POA: Diagnosis not present

## 2022-11-08 DIAGNOSIS — M8589 Other specified disorders of bone density and structure, multiple sites: Secondary | ICD-10-CM | POA: Diagnosis not present

## 2022-11-08 DIAGNOSIS — M47816 Spondylosis without myelopathy or radiculopathy, lumbar region: Secondary | ICD-10-CM | POA: Diagnosis not present

## 2022-11-08 DIAGNOSIS — M170 Bilateral primary osteoarthritis of knee: Secondary | ICD-10-CM | POA: Diagnosis not present

## 2022-11-08 DIAGNOSIS — M810 Age-related osteoporosis without current pathological fracture: Secondary | ICD-10-CM | POA: Diagnosis not present

## 2022-11-13 DIAGNOSIS — M4696 Unspecified inflammatory spondylopathy, lumbar region: Secondary | ICD-10-CM | POA: Diagnosis not present

## 2022-11-13 DIAGNOSIS — M48061 Spinal stenosis, lumbar region without neurogenic claudication: Secondary | ICD-10-CM | POA: Diagnosis not present

## 2022-11-13 DIAGNOSIS — M47816 Spondylosis without myelopathy or radiculopathy, lumbar region: Secondary | ICD-10-CM | POA: Diagnosis not present

## 2022-11-13 DIAGNOSIS — M170 Bilateral primary osteoarthritis of knee: Secondary | ICD-10-CM | POA: Diagnosis not present

## 2022-11-13 DIAGNOSIS — I739 Peripheral vascular disease, unspecified: Secondary | ICD-10-CM | POA: Diagnosis not present

## 2022-11-13 DIAGNOSIS — M8589 Other specified disorders of bone density and structure, multiple sites: Secondary | ICD-10-CM | POA: Diagnosis not present

## 2022-11-13 DIAGNOSIS — M160 Bilateral primary osteoarthritis of hip: Secondary | ICD-10-CM | POA: Diagnosis not present

## 2022-11-13 DIAGNOSIS — M542 Cervicalgia: Secondary | ICD-10-CM | POA: Diagnosis not present

## 2022-11-13 DIAGNOSIS — M810 Age-related osteoporosis without current pathological fracture: Secondary | ICD-10-CM | POA: Diagnosis not present

## 2022-11-20 DIAGNOSIS — M160 Bilateral primary osteoarthritis of hip: Secondary | ICD-10-CM | POA: Diagnosis not present

## 2022-11-20 DIAGNOSIS — M47816 Spondylosis without myelopathy or radiculopathy, lumbar region: Secondary | ICD-10-CM | POA: Diagnosis not present

## 2022-11-20 DIAGNOSIS — M8589 Other specified disorders of bone density and structure, multiple sites: Secondary | ICD-10-CM | POA: Diagnosis not present

## 2022-11-20 DIAGNOSIS — M170 Bilateral primary osteoarthritis of knee: Secondary | ICD-10-CM | POA: Diagnosis not present

## 2022-11-20 DIAGNOSIS — M542 Cervicalgia: Secondary | ICD-10-CM | POA: Diagnosis not present

## 2022-11-20 DIAGNOSIS — M48061 Spinal stenosis, lumbar region without neurogenic claudication: Secondary | ICD-10-CM | POA: Diagnosis not present

## 2022-11-20 DIAGNOSIS — M810 Age-related osteoporosis without current pathological fracture: Secondary | ICD-10-CM | POA: Diagnosis not present

## 2022-11-20 DIAGNOSIS — M4696 Unspecified inflammatory spondylopathy, lumbar region: Secondary | ICD-10-CM | POA: Diagnosis not present

## 2022-11-20 DIAGNOSIS — I739 Peripheral vascular disease, unspecified: Secondary | ICD-10-CM | POA: Diagnosis not present

## 2022-11-21 DIAGNOSIS — M48061 Spinal stenosis, lumbar region without neurogenic claudication: Secondary | ICD-10-CM | POA: Diagnosis not present

## 2022-11-21 DIAGNOSIS — M47816 Spondylosis without myelopathy or radiculopathy, lumbar region: Secondary | ICD-10-CM | POA: Diagnosis not present

## 2022-11-21 DIAGNOSIS — I739 Peripheral vascular disease, unspecified: Secondary | ICD-10-CM | POA: Diagnosis not present

## 2022-11-21 DIAGNOSIS — M8589 Other specified disorders of bone density and structure, multiple sites: Secondary | ICD-10-CM | POA: Diagnosis not present

## 2022-11-21 DIAGNOSIS — M160 Bilateral primary osteoarthritis of hip: Secondary | ICD-10-CM | POA: Diagnosis not present

## 2022-11-21 DIAGNOSIS — M542 Cervicalgia: Secondary | ICD-10-CM | POA: Diagnosis not present

## 2022-11-21 DIAGNOSIS — M810 Age-related osteoporosis without current pathological fracture: Secondary | ICD-10-CM | POA: Diagnosis not present

## 2022-11-21 DIAGNOSIS — M170 Bilateral primary osteoarthritis of knee: Secondary | ICD-10-CM | POA: Diagnosis not present

## 2022-11-21 DIAGNOSIS — M4696 Unspecified inflammatory spondylopathy, lumbar region: Secondary | ICD-10-CM | POA: Diagnosis not present

## 2022-11-27 DIAGNOSIS — M542 Cervicalgia: Secondary | ICD-10-CM | POA: Diagnosis not present

## 2022-11-27 DIAGNOSIS — M47816 Spondylosis without myelopathy or radiculopathy, lumbar region: Secondary | ICD-10-CM | POA: Diagnosis not present

## 2022-11-27 DIAGNOSIS — M4696 Unspecified inflammatory spondylopathy, lumbar region: Secondary | ICD-10-CM | POA: Diagnosis not present

## 2022-11-27 DIAGNOSIS — M160 Bilateral primary osteoarthritis of hip: Secondary | ICD-10-CM | POA: Diagnosis not present

## 2022-11-27 DIAGNOSIS — M170 Bilateral primary osteoarthritis of knee: Secondary | ICD-10-CM | POA: Diagnosis not present

## 2022-11-27 DIAGNOSIS — M48061 Spinal stenosis, lumbar region without neurogenic claudication: Secondary | ICD-10-CM | POA: Diagnosis not present

## 2022-11-27 DIAGNOSIS — M8589 Other specified disorders of bone density and structure, multiple sites: Secondary | ICD-10-CM | POA: Diagnosis not present

## 2022-11-27 DIAGNOSIS — I739 Peripheral vascular disease, unspecified: Secondary | ICD-10-CM | POA: Diagnosis not present

## 2022-11-27 DIAGNOSIS — M810 Age-related osteoporosis without current pathological fracture: Secondary | ICD-10-CM | POA: Diagnosis not present

## 2022-11-29 DIAGNOSIS — M2042 Other hammer toe(s) (acquired), left foot: Secondary | ICD-10-CM | POA: Diagnosis not present

## 2022-11-29 DIAGNOSIS — L03032 Cellulitis of left toe: Secondary | ICD-10-CM | POA: Diagnosis not present

## 2022-12-04 ENCOUNTER — Other Ambulatory Visit (HOSPITAL_COMMUNITY): Payer: Medicare PPO

## 2022-12-04 DIAGNOSIS — M542 Cervicalgia: Secondary | ICD-10-CM | POA: Diagnosis not present

## 2022-12-04 DIAGNOSIS — M170 Bilateral primary osteoarthritis of knee: Secondary | ICD-10-CM | POA: Diagnosis not present

## 2022-12-04 DIAGNOSIS — M4696 Unspecified inflammatory spondylopathy, lumbar region: Secondary | ICD-10-CM | POA: Diagnosis not present

## 2022-12-04 DIAGNOSIS — I739 Peripheral vascular disease, unspecified: Secondary | ICD-10-CM | POA: Diagnosis not present

## 2022-12-04 DIAGNOSIS — M810 Age-related osteoporosis without current pathological fracture: Secondary | ICD-10-CM | POA: Diagnosis not present

## 2022-12-04 DIAGNOSIS — M8589 Other specified disorders of bone density and structure, multiple sites: Secondary | ICD-10-CM | POA: Diagnosis not present

## 2022-12-04 DIAGNOSIS — M48061 Spinal stenosis, lumbar region without neurogenic claudication: Secondary | ICD-10-CM | POA: Diagnosis not present

## 2022-12-04 DIAGNOSIS — M160 Bilateral primary osteoarthritis of hip: Secondary | ICD-10-CM | POA: Diagnosis not present

## 2022-12-04 DIAGNOSIS — M47816 Spondylosis without myelopathy or radiculopathy, lumbar region: Secondary | ICD-10-CM | POA: Diagnosis not present

## 2022-12-05 DIAGNOSIS — M542 Cervicalgia: Secondary | ICD-10-CM | POA: Diagnosis not present

## 2022-12-05 DIAGNOSIS — M48061 Spinal stenosis, lumbar region without neurogenic claudication: Secondary | ICD-10-CM | POA: Diagnosis not present

## 2022-12-05 DIAGNOSIS — M810 Age-related osteoporosis without current pathological fracture: Secondary | ICD-10-CM | POA: Diagnosis not present

## 2022-12-05 DIAGNOSIS — I739 Peripheral vascular disease, unspecified: Secondary | ICD-10-CM | POA: Diagnosis not present

## 2022-12-05 DIAGNOSIS — M47816 Spondylosis without myelopathy or radiculopathy, lumbar region: Secondary | ICD-10-CM | POA: Diagnosis not present

## 2022-12-05 DIAGNOSIS — M8589 Other specified disorders of bone density and structure, multiple sites: Secondary | ICD-10-CM | POA: Diagnosis not present

## 2022-12-05 DIAGNOSIS — M4696 Unspecified inflammatory spondylopathy, lumbar region: Secondary | ICD-10-CM | POA: Diagnosis not present

## 2022-12-05 DIAGNOSIS — M160 Bilateral primary osteoarthritis of hip: Secondary | ICD-10-CM | POA: Diagnosis not present

## 2022-12-05 DIAGNOSIS — M170 Bilateral primary osteoarthritis of knee: Secondary | ICD-10-CM | POA: Diagnosis not present

## 2022-12-11 DIAGNOSIS — M48061 Spinal stenosis, lumbar region without neurogenic claudication: Secondary | ICD-10-CM | POA: Diagnosis not present

## 2022-12-11 DIAGNOSIS — M4696 Unspecified inflammatory spondylopathy, lumbar region: Secondary | ICD-10-CM | POA: Diagnosis not present

## 2022-12-11 DIAGNOSIS — M170 Bilateral primary osteoarthritis of knee: Secondary | ICD-10-CM | POA: Diagnosis not present

## 2022-12-11 DIAGNOSIS — H269 Unspecified cataract: Secondary | ICD-10-CM | POA: Diagnosis not present

## 2022-12-11 DIAGNOSIS — M8589 Other specified disorders of bone density and structure, multiple sites: Secondary | ICD-10-CM | POA: Diagnosis not present

## 2022-12-11 DIAGNOSIS — M47816 Spondylosis without myelopathy or radiculopathy, lumbar region: Secondary | ICD-10-CM | POA: Diagnosis not present

## 2022-12-11 DIAGNOSIS — M160 Bilateral primary osteoarthritis of hip: Secondary | ICD-10-CM | POA: Diagnosis not present

## 2022-12-11 DIAGNOSIS — J309 Allergic rhinitis, unspecified: Secondary | ICD-10-CM | POA: Diagnosis not present

## 2022-12-11 DIAGNOSIS — M1990 Unspecified osteoarthritis, unspecified site: Secondary | ICD-10-CM | POA: Diagnosis not present

## 2022-12-11 DIAGNOSIS — M810 Age-related osteoporosis without current pathological fracture: Secondary | ICD-10-CM | POA: Diagnosis not present

## 2022-12-11 DIAGNOSIS — R32 Unspecified urinary incontinence: Secondary | ICD-10-CM | POA: Diagnosis not present

## 2022-12-11 DIAGNOSIS — I739 Peripheral vascular disease, unspecified: Secondary | ICD-10-CM | POA: Diagnosis not present

## 2022-12-11 DIAGNOSIS — L409 Psoriasis, unspecified: Secondary | ICD-10-CM | POA: Diagnosis not present

## 2022-12-11 DIAGNOSIS — M542 Cervicalgia: Secondary | ICD-10-CM | POA: Diagnosis not present

## 2022-12-11 DIAGNOSIS — F411 Generalized anxiety disorder: Secondary | ICD-10-CM | POA: Diagnosis not present

## 2022-12-11 DIAGNOSIS — R600 Localized edema: Secondary | ICD-10-CM | POA: Diagnosis not present

## 2022-12-11 DIAGNOSIS — I10 Essential (primary) hypertension: Secondary | ICD-10-CM | POA: Diagnosis not present

## 2022-12-11 DIAGNOSIS — L309 Dermatitis, unspecified: Secondary | ICD-10-CM | POA: Diagnosis not present

## 2022-12-13 DIAGNOSIS — H3530 Unspecified macular degeneration: Secondary | ICD-10-CM | POA: Diagnosis not present

## 2022-12-13 DIAGNOSIS — H25812 Combined forms of age-related cataract, left eye: Secondary | ICD-10-CM | POA: Diagnosis not present

## 2022-12-13 DIAGNOSIS — Z961 Presence of intraocular lens: Secondary | ICD-10-CM | POA: Diagnosis not present

## 2022-12-17 DIAGNOSIS — M4696 Unspecified inflammatory spondylopathy, lumbar region: Secondary | ICD-10-CM | POA: Diagnosis not present

## 2022-12-17 DIAGNOSIS — M8589 Other specified disorders of bone density and structure, multiple sites: Secondary | ICD-10-CM | POA: Diagnosis not present

## 2022-12-17 DIAGNOSIS — M810 Age-related osteoporosis without current pathological fracture: Secondary | ICD-10-CM | POA: Diagnosis not present

## 2022-12-17 DIAGNOSIS — M47816 Spondylosis without myelopathy or radiculopathy, lumbar region: Secondary | ICD-10-CM | POA: Diagnosis not present

## 2022-12-17 DIAGNOSIS — M48061 Spinal stenosis, lumbar region without neurogenic claudication: Secondary | ICD-10-CM | POA: Diagnosis not present

## 2022-12-17 DIAGNOSIS — M542 Cervicalgia: Secondary | ICD-10-CM | POA: Diagnosis not present

## 2022-12-17 DIAGNOSIS — I739 Peripheral vascular disease, unspecified: Secondary | ICD-10-CM | POA: Diagnosis not present

## 2022-12-17 DIAGNOSIS — M170 Bilateral primary osteoarthritis of knee: Secondary | ICD-10-CM | POA: Diagnosis not present

## 2022-12-17 DIAGNOSIS — M160 Bilateral primary osteoarthritis of hip: Secondary | ICD-10-CM | POA: Diagnosis not present

## 2022-12-18 DIAGNOSIS — M810 Age-related osteoporosis without current pathological fracture: Secondary | ICD-10-CM | POA: Diagnosis not present

## 2022-12-18 DIAGNOSIS — M170 Bilateral primary osteoarthritis of knee: Secondary | ICD-10-CM | POA: Diagnosis not present

## 2022-12-18 DIAGNOSIS — M4696 Unspecified inflammatory spondylopathy, lumbar region: Secondary | ICD-10-CM | POA: Diagnosis not present

## 2022-12-18 DIAGNOSIS — M48061 Spinal stenosis, lumbar region without neurogenic claudication: Secondary | ICD-10-CM | POA: Diagnosis not present

## 2022-12-18 DIAGNOSIS — M8589 Other specified disorders of bone density and structure, multiple sites: Secondary | ICD-10-CM | POA: Diagnosis not present

## 2022-12-18 DIAGNOSIS — I739 Peripheral vascular disease, unspecified: Secondary | ICD-10-CM | POA: Diagnosis not present

## 2022-12-18 DIAGNOSIS — M160 Bilateral primary osteoarthritis of hip: Secondary | ICD-10-CM | POA: Diagnosis not present

## 2022-12-18 DIAGNOSIS — M542 Cervicalgia: Secondary | ICD-10-CM | POA: Diagnosis not present

## 2022-12-18 DIAGNOSIS — M47816 Spondylosis without myelopathy or radiculopathy, lumbar region: Secondary | ICD-10-CM | POA: Diagnosis not present

## 2022-12-25 DIAGNOSIS — M8589 Other specified disorders of bone density and structure, multiple sites: Secondary | ICD-10-CM | POA: Diagnosis not present

## 2022-12-25 DIAGNOSIS — M810 Age-related osteoporosis without current pathological fracture: Secondary | ICD-10-CM | POA: Diagnosis not present

## 2022-12-25 DIAGNOSIS — I739 Peripheral vascular disease, unspecified: Secondary | ICD-10-CM | POA: Diagnosis not present

## 2022-12-25 DIAGNOSIS — M542 Cervicalgia: Secondary | ICD-10-CM | POA: Diagnosis not present

## 2022-12-25 DIAGNOSIS — M47816 Spondylosis without myelopathy or radiculopathy, lumbar region: Secondary | ICD-10-CM | POA: Diagnosis not present

## 2022-12-25 DIAGNOSIS — M48061 Spinal stenosis, lumbar region without neurogenic claudication: Secondary | ICD-10-CM | POA: Diagnosis not present

## 2022-12-25 DIAGNOSIS — M160 Bilateral primary osteoarthritis of hip: Secondary | ICD-10-CM | POA: Diagnosis not present

## 2022-12-25 DIAGNOSIS — M4696 Unspecified inflammatory spondylopathy, lumbar region: Secondary | ICD-10-CM | POA: Diagnosis not present

## 2022-12-25 DIAGNOSIS — M170 Bilateral primary osteoarthritis of knee: Secondary | ICD-10-CM | POA: Diagnosis not present

## 2023-01-03 DIAGNOSIS — M8589 Other specified disorders of bone density and structure, multiple sites: Secondary | ICD-10-CM | POA: Diagnosis not present

## 2023-01-03 DIAGNOSIS — M542 Cervicalgia: Secondary | ICD-10-CM | POA: Diagnosis not present

## 2023-01-03 DIAGNOSIS — M170 Bilateral primary osteoarthritis of knee: Secondary | ICD-10-CM | POA: Diagnosis not present

## 2023-01-03 DIAGNOSIS — M810 Age-related osteoporosis without current pathological fracture: Secondary | ICD-10-CM | POA: Diagnosis not present

## 2023-01-03 DIAGNOSIS — I739 Peripheral vascular disease, unspecified: Secondary | ICD-10-CM | POA: Diagnosis not present

## 2023-01-03 DIAGNOSIS — M47816 Spondylosis without myelopathy or radiculopathy, lumbar region: Secondary | ICD-10-CM | POA: Diagnosis not present

## 2023-01-03 DIAGNOSIS — M160 Bilateral primary osteoarthritis of hip: Secondary | ICD-10-CM | POA: Diagnosis not present

## 2023-01-03 DIAGNOSIS — M48061 Spinal stenosis, lumbar region without neurogenic claudication: Secondary | ICD-10-CM | POA: Diagnosis not present

## 2023-01-03 DIAGNOSIS — M4696 Unspecified inflammatory spondylopathy, lumbar region: Secondary | ICD-10-CM | POA: Diagnosis not present

## 2023-01-04 DIAGNOSIS — L0390 Cellulitis, unspecified: Secondary | ICD-10-CM | POA: Diagnosis not present

## 2023-01-04 DIAGNOSIS — I872 Venous insufficiency (chronic) (peripheral): Secondary | ICD-10-CM | POA: Diagnosis not present

## 2023-01-07 DIAGNOSIS — I872 Venous insufficiency (chronic) (peripheral): Secondary | ICD-10-CM | POA: Diagnosis not present

## 2023-01-07 DIAGNOSIS — L03115 Cellulitis of right lower limb: Secondary | ICD-10-CM | POA: Diagnosis not present

## 2023-01-08 DIAGNOSIS — M810 Age-related osteoporosis without current pathological fracture: Secondary | ICD-10-CM | POA: Diagnosis not present

## 2023-01-08 DIAGNOSIS — M8589 Other specified disorders of bone density and structure, multiple sites: Secondary | ICD-10-CM | POA: Diagnosis not present

## 2023-01-08 DIAGNOSIS — M170 Bilateral primary osteoarthritis of knee: Secondary | ICD-10-CM | POA: Diagnosis not present

## 2023-01-08 DIAGNOSIS — M160 Bilateral primary osteoarthritis of hip: Secondary | ICD-10-CM | POA: Diagnosis not present

## 2023-01-08 DIAGNOSIS — M542 Cervicalgia: Secondary | ICD-10-CM | POA: Diagnosis not present

## 2023-01-08 DIAGNOSIS — I739 Peripheral vascular disease, unspecified: Secondary | ICD-10-CM | POA: Diagnosis not present

## 2023-01-08 DIAGNOSIS — M48061 Spinal stenosis, lumbar region without neurogenic claudication: Secondary | ICD-10-CM | POA: Diagnosis not present

## 2023-01-08 DIAGNOSIS — M47816 Spondylosis without myelopathy or radiculopathy, lumbar region: Secondary | ICD-10-CM | POA: Diagnosis not present

## 2023-01-08 DIAGNOSIS — M4696 Unspecified inflammatory spondylopathy, lumbar region: Secondary | ICD-10-CM | POA: Diagnosis not present

## 2023-01-10 DIAGNOSIS — F411 Generalized anxiety disorder: Secondary | ICD-10-CM | POA: Diagnosis not present

## 2023-01-14 DIAGNOSIS — L0390 Cellulitis, unspecified: Secondary | ICD-10-CM | POA: Diagnosis not present

## 2023-01-14 DIAGNOSIS — I872 Venous insufficiency (chronic) (peripheral): Secondary | ICD-10-CM | POA: Diagnosis not present

## 2023-01-15 ENCOUNTER — Other Ambulatory Visit: Payer: Self-pay | Admitting: Internal Medicine

## 2023-01-15 ENCOUNTER — Ambulatory Visit
Admission: RE | Admit: 2023-01-15 | Discharge: 2023-01-15 | Disposition: A | Discharge status: Home / Self Care | Payer: Medicare PPO | Source: Ambulatory Visit | Attending: Internal Medicine | Admitting: Internal Medicine

## 2023-01-15 DIAGNOSIS — M25512 Pain in left shoulder: Secondary | ICD-10-CM

## 2023-01-15 DIAGNOSIS — Z9181 History of falling: Secondary | ICD-10-CM | POA: Diagnosis not present

## 2023-01-15 DIAGNOSIS — M19012 Primary osteoarthritis, left shoulder: Secondary | ICD-10-CM | POA: Diagnosis not present

## 2023-01-17 DIAGNOSIS — M4696 Unspecified inflammatory spondylopathy, lumbar region: Secondary | ICD-10-CM | POA: Diagnosis not present

## 2023-01-17 DIAGNOSIS — M542 Cervicalgia: Secondary | ICD-10-CM | POA: Diagnosis not present

## 2023-01-17 DIAGNOSIS — M160 Bilateral primary osteoarthritis of hip: Secondary | ICD-10-CM | POA: Diagnosis not present

## 2023-01-17 DIAGNOSIS — M47816 Spondylosis without myelopathy or radiculopathy, lumbar region: Secondary | ICD-10-CM | POA: Diagnosis not present

## 2023-01-17 DIAGNOSIS — M8589 Other specified disorders of bone density and structure, multiple sites: Secondary | ICD-10-CM | POA: Diagnosis not present

## 2023-01-17 DIAGNOSIS — M170 Bilateral primary osteoarthritis of knee: Secondary | ICD-10-CM | POA: Diagnosis not present

## 2023-01-17 DIAGNOSIS — M810 Age-related osteoporosis without current pathological fracture: Secondary | ICD-10-CM | POA: Diagnosis not present

## 2023-01-17 DIAGNOSIS — I739 Peripheral vascular disease, unspecified: Secondary | ICD-10-CM | POA: Diagnosis not present

## 2023-01-17 DIAGNOSIS — M48061 Spinal stenosis, lumbar region without neurogenic claudication: Secondary | ICD-10-CM | POA: Diagnosis not present

## 2023-01-22 DIAGNOSIS — M160 Bilateral primary osteoarthritis of hip: Secondary | ICD-10-CM | POA: Diagnosis not present

## 2023-01-22 DIAGNOSIS — M542 Cervicalgia: Secondary | ICD-10-CM | POA: Diagnosis not present

## 2023-01-22 DIAGNOSIS — M47816 Spondylosis without myelopathy or radiculopathy, lumbar region: Secondary | ICD-10-CM | POA: Diagnosis not present

## 2023-01-22 DIAGNOSIS — M810 Age-related osteoporosis without current pathological fracture: Secondary | ICD-10-CM | POA: Diagnosis not present

## 2023-01-22 DIAGNOSIS — M48061 Spinal stenosis, lumbar region without neurogenic claudication: Secondary | ICD-10-CM | POA: Diagnosis not present

## 2023-01-22 DIAGNOSIS — M4696 Unspecified inflammatory spondylopathy, lumbar region: Secondary | ICD-10-CM | POA: Diagnosis not present

## 2023-01-22 DIAGNOSIS — M170 Bilateral primary osteoarthritis of knee: Secondary | ICD-10-CM | POA: Diagnosis not present

## 2023-01-22 DIAGNOSIS — I739 Peripheral vascular disease, unspecified: Secondary | ICD-10-CM | POA: Diagnosis not present

## 2023-01-22 DIAGNOSIS — M8589 Other specified disorders of bone density and structure, multiple sites: Secondary | ICD-10-CM | POA: Diagnosis not present

## 2023-01-28 DIAGNOSIS — M47816 Spondylosis without myelopathy or radiculopathy, lumbar region: Secondary | ICD-10-CM | POA: Diagnosis not present

## 2023-01-28 DIAGNOSIS — M160 Bilateral primary osteoarthritis of hip: Secondary | ICD-10-CM | POA: Diagnosis not present

## 2023-01-28 DIAGNOSIS — M48061 Spinal stenosis, lumbar region without neurogenic claudication: Secondary | ICD-10-CM | POA: Diagnosis not present

## 2023-01-28 DIAGNOSIS — M542 Cervicalgia: Secondary | ICD-10-CM | POA: Diagnosis not present

## 2023-01-28 DIAGNOSIS — M170 Bilateral primary osteoarthritis of knee: Secondary | ICD-10-CM | POA: Diagnosis not present

## 2023-01-28 DIAGNOSIS — I739 Peripheral vascular disease, unspecified: Secondary | ICD-10-CM | POA: Diagnosis not present

## 2023-01-28 DIAGNOSIS — M810 Age-related osteoporosis without current pathological fracture: Secondary | ICD-10-CM | POA: Diagnosis not present

## 2023-01-28 DIAGNOSIS — M4696 Unspecified inflammatory spondylopathy, lumbar region: Secondary | ICD-10-CM | POA: Diagnosis not present

## 2023-01-28 DIAGNOSIS — M8589 Other specified disorders of bone density and structure, multiple sites: Secondary | ICD-10-CM | POA: Diagnosis not present

## 2023-02-05 DIAGNOSIS — I739 Peripheral vascular disease, unspecified: Secondary | ICD-10-CM | POA: Diagnosis not present

## 2023-02-05 DIAGNOSIS — M47816 Spondylosis without myelopathy or radiculopathy, lumbar region: Secondary | ICD-10-CM | POA: Diagnosis not present

## 2023-02-05 DIAGNOSIS — M542 Cervicalgia: Secondary | ICD-10-CM | POA: Diagnosis not present

## 2023-02-05 DIAGNOSIS — M4696 Unspecified inflammatory spondylopathy, lumbar region: Secondary | ICD-10-CM | POA: Diagnosis not present

## 2023-02-05 DIAGNOSIS — M8589 Other specified disorders of bone density and structure, multiple sites: Secondary | ICD-10-CM | POA: Diagnosis not present

## 2023-02-05 DIAGNOSIS — M48061 Spinal stenosis, lumbar region without neurogenic claudication: Secondary | ICD-10-CM | POA: Diagnosis not present

## 2023-02-05 DIAGNOSIS — M160 Bilateral primary osteoarthritis of hip: Secondary | ICD-10-CM | POA: Diagnosis not present

## 2023-02-05 DIAGNOSIS — M170 Bilateral primary osteoarthritis of knee: Secondary | ICD-10-CM | POA: Diagnosis not present

## 2023-02-05 DIAGNOSIS — M810 Age-related osteoporosis without current pathological fracture: Secondary | ICD-10-CM | POA: Diagnosis not present

## 2023-02-11 DIAGNOSIS — I872 Venous insufficiency (chronic) (peripheral): Secondary | ICD-10-CM | POA: Diagnosis not present

## 2023-02-11 DIAGNOSIS — R600 Localized edema: Secondary | ICD-10-CM | POA: Diagnosis not present

## 2023-02-11 DIAGNOSIS — L03115 Cellulitis of right lower limb: Secondary | ICD-10-CM | POA: Diagnosis not present

## 2023-02-11 DIAGNOSIS — L0390 Cellulitis, unspecified: Secondary | ICD-10-CM | POA: Diagnosis not present

## 2023-02-12 DIAGNOSIS — M48061 Spinal stenosis, lumbar region without neurogenic claudication: Secondary | ICD-10-CM | POA: Diagnosis not present

## 2023-02-12 DIAGNOSIS — M170 Bilateral primary osteoarthritis of knee: Secondary | ICD-10-CM | POA: Diagnosis not present

## 2023-02-12 DIAGNOSIS — M542 Cervicalgia: Secondary | ICD-10-CM | POA: Diagnosis not present

## 2023-02-12 DIAGNOSIS — M47816 Spondylosis without myelopathy or radiculopathy, lumbar region: Secondary | ICD-10-CM | POA: Diagnosis not present

## 2023-02-12 DIAGNOSIS — M8589 Other specified disorders of bone density and structure, multiple sites: Secondary | ICD-10-CM | POA: Diagnosis not present

## 2023-02-12 DIAGNOSIS — M810 Age-related osteoporosis without current pathological fracture: Secondary | ICD-10-CM | POA: Diagnosis not present

## 2023-02-12 DIAGNOSIS — I739 Peripheral vascular disease, unspecified: Secondary | ICD-10-CM | POA: Diagnosis not present

## 2023-02-12 DIAGNOSIS — M160 Bilateral primary osteoarthritis of hip: Secondary | ICD-10-CM | POA: Diagnosis not present

## 2023-02-12 DIAGNOSIS — M4696 Unspecified inflammatory spondylopathy, lumbar region: Secondary | ICD-10-CM | POA: Diagnosis not present

## 2023-02-15 DIAGNOSIS — I82442 Acute embolism and thrombosis of left tibial vein: Secondary | ICD-10-CM | POA: Diagnosis not present

## 2023-02-15 DIAGNOSIS — R600 Localized edema: Secondary | ICD-10-CM | POA: Diagnosis not present

## 2023-02-21 DIAGNOSIS — I82402 Acute embolism and thrombosis of unspecified deep veins of left lower extremity: Secondary | ICD-10-CM | POA: Diagnosis not present

## 2023-02-21 DIAGNOSIS — L97918 Non-pressure chronic ulcer of unspecified part of right lower leg with other specified severity: Secondary | ICD-10-CM | POA: Diagnosis not present

## 2023-02-21 DIAGNOSIS — I872 Venous insufficiency (chronic) (peripheral): Secondary | ICD-10-CM | POA: Diagnosis not present

## 2023-02-21 DIAGNOSIS — I83019 Varicose veins of right lower extremity with ulcer of unspecified site: Secondary | ICD-10-CM | POA: Diagnosis not present

## 2023-02-25 ENCOUNTER — Emergency Department (HOSPITAL_COMMUNITY): Payer: Medicare PPO

## 2023-02-25 ENCOUNTER — Encounter (HOSPITAL_COMMUNITY): Payer: Self-pay

## 2023-02-25 ENCOUNTER — Emergency Department (HOSPITAL_COMMUNITY)
Admission: EM | Admit: 2023-02-25 | Discharge: 2023-02-25 | Disposition: A | Discharge status: Home / Self Care | Payer: Medicare PPO | Attending: Emergency Medicine | Admitting: Emergency Medicine

## 2023-02-25 ENCOUNTER — Other Ambulatory Visit: Payer: Self-pay

## 2023-02-25 DIAGNOSIS — R9431 Abnormal electrocardiogram [ECG] [EKG]: Secondary | ICD-10-CM | POA: Diagnosis not present

## 2023-02-25 DIAGNOSIS — U071 COVID-19: Secondary | ICD-10-CM | POA: Insufficient documentation

## 2023-02-25 DIAGNOSIS — R7989 Other specified abnormal findings of blood chemistry: Secondary | ICD-10-CM | POA: Diagnosis not present

## 2023-02-25 DIAGNOSIS — S0990XA Unspecified injury of head, initial encounter: Secondary | ICD-10-CM | POA: Diagnosis not present

## 2023-02-25 DIAGNOSIS — R531 Weakness: Secondary | ICD-10-CM | POA: Diagnosis not present

## 2023-02-25 DIAGNOSIS — R059 Cough, unspecified: Secondary | ICD-10-CM | POA: Diagnosis not present

## 2023-02-25 DIAGNOSIS — I6523 Occlusion and stenosis of bilateral carotid arteries: Secondary | ICD-10-CM | POA: Diagnosis not present

## 2023-02-25 DIAGNOSIS — G319 Degenerative disease of nervous system, unspecified: Secondary | ICD-10-CM | POA: Diagnosis not present

## 2023-02-25 DIAGNOSIS — N3000 Acute cystitis without hematuria: Secondary | ICD-10-CM | POA: Insufficient documentation

## 2023-02-25 DIAGNOSIS — Y92009 Unspecified place in unspecified non-institutional (private) residence as the place of occurrence of the external cause: Secondary | ICD-10-CM | POA: Diagnosis not present

## 2023-02-25 DIAGNOSIS — S199XXA Unspecified injury of neck, initial encounter: Secondary | ICD-10-CM | POA: Diagnosis not present

## 2023-02-25 DIAGNOSIS — I10 Essential (primary) hypertension: Secondary | ICD-10-CM | POA: Diagnosis not present

## 2023-02-25 DIAGNOSIS — W19XXXA Unspecified fall, initial encounter: Secondary | ICD-10-CM | POA: Insufficient documentation

## 2023-02-25 DIAGNOSIS — R609 Edema, unspecified: Secondary | ICD-10-CM | POA: Diagnosis not present

## 2023-02-25 DIAGNOSIS — Z7401 Bed confinement status: Secondary | ICD-10-CM | POA: Diagnosis not present

## 2023-02-25 DIAGNOSIS — R0689 Other abnormalities of breathing: Secondary | ICD-10-CM | POA: Diagnosis not present

## 2023-02-25 LAB — CBC WITH DIFFERENTIAL/PLATELET
Abs Immature Granulocytes: 0.01 10*3/uL (ref 0.00–0.07)
Basophils Absolute: 0.1 10*3/uL (ref 0.0–0.1)
Basophils Relative: 1 %
Eosinophils Absolute: 0 10*3/uL (ref 0.0–0.5)
Eosinophils Relative: 0 %
HCT: 41.6 % (ref 36.0–46.0)
Hemoglobin: 12.8 g/dL (ref 12.0–15.0)
Immature Granulocytes: 0 %
Lymphocytes Relative: 12 %
Lymphs Abs: 0.9 10*3/uL (ref 0.7–4.0)
MCH: 28.1 pg (ref 26.0–34.0)
MCHC: 30.8 g/dL (ref 30.0–36.0)
MCV: 91.2 fL (ref 80.0–100.0)
Monocytes Absolute: 0.8 10*3/uL (ref 0.1–1.0)
Monocytes Relative: 10 %
Neutro Abs: 5.8 10*3/uL (ref 1.7–7.7)
Neutrophils Relative %: 77 %
Platelets: 174 10*3/uL (ref 150–400)
RBC: 4.56 MIL/uL (ref 3.87–5.11)
RDW: 12.6 % (ref 11.5–15.5)
WBC: 7.6 10*3/uL (ref 4.0–10.5)
nRBC: 0 % (ref 0.0–0.2)

## 2023-02-25 LAB — TROPONIN I (HIGH SENSITIVITY)
Troponin I (High Sensitivity): 20 ng/L — ABNORMAL HIGH (ref <18)
Troponin I (High Sensitivity): 24 ng/L — ABNORMAL HIGH (ref <18)

## 2023-02-25 LAB — COMPREHENSIVE METABOLIC PANEL
ALT: 12 U/L (ref 0–44)
AST: 17 U/L (ref 15–41)
Albumin: 3.3 g/dL — ABNORMAL LOW (ref 3.5–5.0)
Alkaline Phosphatase: 105 U/L (ref 38–126)
Anion gap: 10 (ref 5–15)
BUN: 14 mg/dL (ref 8–23)
CO2: 25 mmol/L (ref 22–32)
Calcium: 8.7 mg/dL — ABNORMAL LOW (ref 8.9–10.3)
Chloride: 104 mmol/L (ref 98–111)
Creatinine, Ser: 0.66 mg/dL (ref 0.44–1.00)
GFR, Estimated: >60 mL/min (ref >60)
Glucose, Bld: 103 mg/dL — ABNORMAL HIGH (ref 70–99)
Potassium: 3.9 mmol/L (ref 3.5–5.1)
Sodium: 139 mmol/L (ref 135–145)
Total Bilirubin: 0.4 mg/dL (ref 0.3–1.2)
Total Protein: 6.3 g/dL — ABNORMAL LOW (ref 6.5–8.1)

## 2023-02-25 LAB — URINALYSIS, ROUTINE W REFLEX MICROSCOPIC
Bilirubin Urine: NEGATIVE
Glucose, UA: NEGATIVE mg/dL
Ketones, ur: NEGATIVE mg/dL
Nitrite: NEGATIVE
Protein, ur: 30 mg/dL — AB
Specific Gravity, Urine: 1.02 (ref 1.005–1.030)
WBC, UA: >50 WBC/hpf (ref 0–5)
pH: 5 (ref 5.0–8.0)

## 2023-02-25 LAB — I-STAT CHEM 8, ED
BUN: 16 mg/dL (ref 8–23)
Calcium, Ion: 1.15 mmol/L (ref 1.15–1.40)
Chloride: 103 mmol/L (ref 98–111)
Creatinine, Ser: 0.6 mg/dL (ref 0.44–1.00)
Glucose, Bld: 97 mg/dL (ref 70–99)
HCT: 41 % (ref 36.0–46.0)
Hemoglobin: 13.9 g/dL (ref 12.0–15.0)
Potassium: 3.9 mmol/L (ref 3.5–5.1)
Sodium: 141 mmol/L (ref 135–145)
TCO2: 26 mmol/L (ref 22–32)

## 2023-02-25 LAB — I-STAT CG4 LACTIC ACID, ED: Lactic Acid, Venous: 0.5 mmol/L (ref 0.5–1.9)

## 2023-02-25 LAB — SARS CORONAVIRUS 2 BY RT PCR: SARS Coronavirus 2 by RT PCR: POSITIVE — AB

## 2023-02-25 MED ORDER — CEPHALEXIN 500 MG PO CAPS: 500.0000 mg | ORAL_CAPSULE | Freq: Two times a day (BID) | ORAL | 0 refills | Status: AC

## 2023-02-25 MED ORDER — CEPHALEXIN 250 MG PO CAPS
500.0000 mg | ORAL_CAPSULE | Freq: Once | ORAL | Status: AC
  Administered 2023-02-25: 500 mg via ORAL
  Filled 2023-02-25: qty 2

## 2023-02-25 NOTE — ED Provider Notes (Signed)
    Accepted handoff at shift change from Army Melia PA-C. Please see prior provider note for more detail.   Briefly: Patient is 82 y.o. "Patient states that she took a nap yesterday and when she woke up she was generally weak. He does not know why she has been feeling weak. This evening, patient was walking when she felt diffusely weak and fell onto the floor. Patient states that she landed with her left leg awkwardly out the door and her head "in her arms." Clarified, she was slumped forward when she fell, denies hitting her head and feels certain she did not hit her head. She is on Eliquis for a DVT which she believes is in her left leg. She has a chronic wound to her right lower leg and has recently completed antibiotics without any improvement. Patient has been referred to wound care by her PCP for this. She denies chest pain, fevers, chills, changes in bowel or bladder habits. She states that she has had a cough today. "  DDX: concern for  UTI, metabolic/electrolyte derangement, intracranial injury, c-spine injury, cellulitis, sepsis   Plan:  -Dispo pending UA  -UA concerning for UTI with moderate Leukocytes. BUN/Cr reassuring. No leukocytosis. Will start patient on ABX. Urine Culture pending. - Patient given Keflex in ED which she tolerated well. Sent rest of prescription to pharmacy. Recommend PCP follow up. Patient verbalized understanding of plan. - Patient afebrile with stable vitals. Provided with return precautions. Discharged in good condition.    Valrie Hart F, [place] 02/25/23 [id number]    Sloan Leiter, DO 03/02/23 0001

## 2023-02-25 NOTE — ED Triage Notes (Signed)
Pt bib [facility] from home where she lives with her husband. Pt states she started feeling weak today after she woke up from a nap. Pt states that she was trying to walk up one step tonight and fell over because she was too weak. Pt states she fell back onto her right arm and butt. Denies hitting head or passing out. Pt is on Eliquis for blood clots. Pt has cellulitis on her right lower leg and has the leg wrapped with drainage and foul odor. Wound was last changed 2 days ago.  AOx4.

## 2023-02-25 NOTE — ED Provider Notes (Signed)
[facility] EMERGENCY DEPARTMENT AT [facility] Provider Note   CSN: [id number] Arrival date & time: 02/25/23  0141     History  Chief Complaint  Patient presents with   Fall   Weakness    [name] is a 82 y.o. female.  83 year old female brought in by EMS from home.  Patient states that she took a nap yesterday and when she woke up she was generally weak.  He does not know why she has been feeling weak.  This evening, patient was walking when she felt diffusely weak and fell onto the floor.  Patient states that she landed with her left leg awkwardly out the door and her head "in her arms."  Clarified, she was slumped forward when she fell, denies hitting her head and feels certain she did not hit her head.  She is on Eliquis for a DVT which she believes is in her left leg.  She has a chronic wound to her right lower leg and has recently completed antibiotics without any improvement.  Patient has been referred to wound care by her PCP for this.  She denies chest pain, fevers, chills, changes in bowel or bladder habits.  She states that she has had a cough today.       Home Medications Prior to Admission medications   Medication Sig Start Date End Date Taking? Authorizing Provider  acetaminophen (TYLENOL) 325 MG tablet Take 2 tablets (650 mg total) by mouth every 6 (six) hours as needed for mild pain (or Fever >/= 101). 05/27/22   Osvaldo Shipper, MD  Calcium Carb-Cholecalciferol (CALCIUM + VITAMIN D3 PO) Take 1 tablet by mouth daily.    [provider]  cetirizine (ZYRTEC) 10 MG tablet Take 10 mg by mouth daily as needed for allergies.    [provider]  cyanocobalamin 1000 MCG tablet Take 1 tablet (1,000 mcg total) by mouth daily. 05/30/22   Osvaldo Shipper, MD  feeding supplement (ENSURE ENLIVE / ENSURE PLUS) LIQD Take 237 mLs by mouth 2 (two) times daily between meals. 05/27/22   Osvaldo Shipper, MD  FLUoxetine (PROZAC) 20 MG tablet Take 60 mg  by mouth daily.    [provider]  ibuprofen (ADVIL) 200 MG tablet Take 400 mg by mouth daily as needed for headache, moderate pain or mild pain.    [provider]  lidocaine (LIDODERM) 5 % Place 1 patch onto the skin daily. Remove & Discard patch within 12 hours or as directed by MD Patient taking differently: Place 1 patch onto the skin daily as needed (pain). 05/27/22   Osvaldo Shipper, MD  LORazepam (ATIVAN) 0.5 MG tablet Take 1 tablet (0.5 mg total) by mouth daily as needed for anxiety or sleep. 05/27/22   Osvaldo Shipper, MD  methocarbamol (ROBAXIN) 500 MG tablet Take 1 tablet (500 mg total) by mouth every 6 (six) hours as needed for muscle spasms. 05/28/22   Osvaldo Shipper, MD  polyethylene glycol (MIRALAX / GLYCOLAX) 17 g packet Take 17 g by mouth 2 (two) times daily. 05/28/22   Osvaldo Shipper, MD  senna-docusate (SENOKOT-S) 8.6-50 MG tablet Take 2 tablets by mouth 2 (two) times daily. 05/27/22   Osvaldo Shipper, MD  valACYclovir (VALTREX) 500 MG tablet Take 1 tablet (500 mg total) by mouth every other day. 10/30/22   [name], NP      Allergies    Patient has no known allergies.    Review of Systems   Review of Systems  Negative except as per HPI Physical Exam Updated Vital Signs BP 138/74 (BP Location: Right Arm)   Pulse 77   Temp 98.5 F (36.9 C) (Oral)   Resp 19   Ht 5\' 8"  (1.727 m)   Wt 78 kg   SpO2 100%   BMI 26.15 kg/m  Physical Exam Vitals and nursing note reviewed.  Constitutional:      General: She is not in acute distress.    Appearance: She is well-developed. She is not diaphoretic.  HENT:     Head: Normocephalic and atraumatic.     Mouth/Throat:     Mouth: Mucous membranes are moist.  Eyes:     Extraocular Movements: Extraocular movements intact.     Pupils: Pupils are equal, round, and reactive to light.  Cardiovascular:     Rate and Rhythm: Normal rate and regular rhythm.     Pulses: Normal pulses.     Heart sounds:  Normal heart sounds.  Pulmonary:     Effort: Pulmonary effort is normal.     Breath sounds: Normal breath sounds.  Abdominal:     Palpations: Abdomen is soft.     Tenderness: There is no abdominal tenderness.  Musculoskeletal:        General: Swelling and tenderness present.     Cervical back: Normal range of motion and neck supple. No tenderness or bony tenderness.     Thoracic back: No tenderness or bony tenderness.     Lumbar back: No tenderness or bony tenderness.     Comments: Swelling of the right lower leg with shallow wound (see pictures)  Skin:    General: Skin is warm and dry.  Neurological:     Mental Status: She is alert and oriented to person, place, and time.  Psychiatric:        Behavior: Behavior normal.   Posterior/medial right lower leg  Anterior lateral right lower leg   ED Results / Procedures / Treatments   Labs (all labs ordered are listed, but only abnormal results are displayed) Labs Reviewed  SARS CORONAVIRUS 2 BY RT PCR - Abnormal; Notable for the following components:      Result Value   SARS Coronavirus 2 by RT PCR POSITIVE (*)    All other components within normal limits  COMPREHENSIVE METABOLIC PANEL - Abnormal; Notable for the following components:   Glucose, Bld 103 (*)    Calcium 8.7 (*)    Total Protein 6.3 (*)    Albumin 3.3 (*)    All other components within normal limits  TROPONIN I (HIGH SENSITIVITY) - Abnormal; Notable for the following components:   Troponin I (High Sensitivity) 20 (*)    All other components within normal limits  TROPONIN I (HIGH SENSITIVITY) - Abnormal; Notable for the following components:   Troponin I (High Sensitivity) 24 (*)    All other components within normal limits  CBC WITH DIFFERENTIAL/PLATELET  URINALYSIS, ROUTINE W REFLEX MICROSCOPIC  I-STAT CG4 LACTIC ACID, ED  I-STAT CHEM 8, ED    EKG EKG Interpretation Date/Time:  Monday February 25 2023 01:50:51 EDT Ventricular Rate:  77 PR  Interval:  182 QRS Duration:  97 QT Interval:  417 QTC Calculation: 472 R Axis:   83  Text Interpretation: Sinus rhythm Borderline right axis deviation Confirmed by Tanda Rockers ([id number]) on 02/25/2023 2:33:07 AM  Radiology CT Head Wo Contrast  Result Date: 02/25/2023 CLINICAL DATA:  Head trauma EXAM: CT HEAD WITHOUT CONTRAST CT CERVICAL SPINE WITHOUT CONTRAST TECHNIQUE: Multidetector  CT imaging of the head and cervical spine was performed following the standard protocol without intravenous contrast. Multiplanar CT image reconstructions of the cervical spine were also generated. RADIATION DOSE REDUCTION: This exam was performed according to the departmental dose-optimization program which includes automated exposure control, adjustment of the mA and/or kV according to patient size and/or use of iterative reconstruction technique. COMPARISON:  None Available. FINDINGS: CT HEAD FINDINGS Brain: There is no mass, hemorrhage or extra-axial collection. There is generalized atrophy without lobar predilection. There is hypoattenuation of the periventricular white matter, most commonly indicating chronic ischemic microangiopathy. Vascular: Atherosclerotic calcification of the internal carotid arteries at the skull base. No abnormal hyperdensity of the major intracranial arteries or dural venous sinuses. Skull: The visualized skull base, calvarium and extracranial soft tissues are normal. Sinuses/Orbits: No fluid levels or advanced mucosal thickening of the visualized paranasal sinuses. No mastoid or middle ear effusion. The orbits are normal. CT CERVICAL SPINE FINDINGS Alignment: No static subluxation. Facets are aligned. Occipital condyles are normally positioned. Skull base and vertebrae: No acute fracture. Soft tissues and spinal canal: No prevertebral fluid or swelling. No visible canal hematoma. Disc levels: No advanced spinal canal or neural foraminal stenosis. Upper chest: No pneumothorax, pulmonary nodule or  pleural effusion. Other: Normal visualized paraspinal cervical soft tissues. IMPRESSION: 1. No acute intracranial abnormality. 2. Chronic ischemic microangiopathy and generalized atrophy. 3. No acute fracture or static subluxation of the cervical spine. Electronically Signed   By: Deatra Robinson M.D.   On: 02/25/2023 03:23   CT Cervical Spine Wo Contrast  Result Date: 02/25/2023 CLINICAL DATA:  Head trauma EXAM: CT HEAD WITHOUT CONTRAST CT CERVICAL SPINE WITHOUT CONTRAST TECHNIQUE: Multidetector CT imaging of the head and cervical spine was performed following the standard protocol without intravenous contrast. Multiplanar CT image reconstructions of the cervical spine were also generated. RADIATION DOSE REDUCTION: This exam was performed according to the departmental dose-optimization program which includes automated exposure control, adjustment of the mA and/or kV according to patient size and/or use of iterative reconstruction technique. COMPARISON:  None Available. FINDINGS: CT HEAD FINDINGS Brain: There is no mass, hemorrhage or extra-axial collection. There is generalized atrophy without lobar predilection. There is hypoattenuation of the periventricular white matter, most commonly indicating chronic ischemic microangiopathy. Vascular: Atherosclerotic calcification of the internal carotid arteries at the skull base. No abnormal hyperdensity of the major intracranial arteries or dural venous sinuses. Skull: The visualized skull base, calvarium and extracranial soft tissues are normal. Sinuses/Orbits: No fluid levels or advanced mucosal thickening of the visualized paranasal sinuses. No mastoid or middle ear effusion. The orbits are normal. CT CERVICAL SPINE FINDINGS Alignment: No static subluxation. Facets are aligned. Occipital condyles are normally positioned. Skull base and vertebrae: No acute fracture. Soft tissues and spinal canal: No prevertebral fluid or swelling. No visible canal hematoma. Disc  levels: No advanced spinal canal or neural foraminal stenosis. Upper chest: No pneumothorax, pulmonary nodule or pleural effusion. Other: Normal visualized paraspinal cervical soft tissues. IMPRESSION: 1. No acute intracranial abnormality. 2. Chronic ischemic microangiopathy and generalized atrophy. 3. No acute fracture or static subluxation of the cervical spine. Electronically Signed   By: Deatra Robinson M.D.   On: 02/25/2023 03:23   DG Chest Port 1 View  Result Date: 02/25/2023 CLINICAL DATA:  Cough and weakness EXAM: PORTABLE CHEST 1 VIEW COMPARISON:  Chest x-ray 05/25/2022 FINDINGS: The heart size and mediastinal contours are within normal limits. Both lungs are clear. The visualized skeletal structures are unremarkable. IMPRESSION: No  active disease. Electronically Signed   By: Darliss Cheney M.D.   On: 02/25/2023 02:40    Procedures Procedures    Medications Ordered in ED Medications - No data to display  ED Course/ Medical Decision Making/ A&P                                 Medical Decision Making Amount and/or Complexity of Data Reviewed Labs: ordered. Radiology: ordered.   This patient presents to the ED for concern of weakness, fall, this involves an extensive number of treatment options, and is a complaint that carries with it a high risk of complications and morbidity.  The differential diagnosis includes but not limited to UTI, metabolic/electrolyte derangement, intracranial injury, c-spine injury, cellulitis, sepsis    Co morbidities that complicate the patient evaluation  Anxiety, depression, arthritis, GERD, osteoporosis, degenerative disc disease, macular degeneration, DVT, on Eliquis.   Additional history obtained:  Additional history obtained from EMS who contributes to history as above External records from outside source obtained and reviewed including recent discharge summary dated 08/18/2022 where patient was admitted for cellulitis.  Of note, patient had a  vascular ultrasound at that time bilaterally which was negative for DVT dated 08/17/2022.   Lab Tests:  I Ordered, and personally interpreted labs.  The pertinent results include: Lactic acid reassuring at 0.5.  Troponin is elevated at 20 without prior on file.  CMP without significant findings.  CBC normal.  Patient is positive for COVID.   Imaging Studies ordered:  I ordered imaging studies including CT head, CT C-spine, chest x-ray I independently visualized and interpreted imaging which showed no acute process or traumatic findings I agree with the radiologist interpretation   Cardiac Monitoring: / EKG:  The patient was maintained on a cardiac monitor.  I personally viewed and interpreted the cardiac monitored which showed an underlying rhythm of: sinus rhythm, rate 77   Consultations Obtained:  I requested consultation with the Dr. Wallace Cullens, ER attending,  and discussed lab and imaging findings as well as pertinent plan - they recommend: Agrees with plan of care   Problem List / ED Course / Critical interventions / Medication management  82 year old female presents from home after a fall today.  Patient states that she felt weak and fell, denies hitting her head or loss of consciousness.  Patient is anticoagulated due to history of recent DVT.  CT head and neck obtained and are negative for acute traumatic findings.  Chest x-ray is unremarked.  Patient reports that she is been feeling weak since waking up from a nap yesterday.  Globally weak without focal or unilateral deficits.  She does mention she has had a cough recently and is positive for COVID.  Patient's initial troponin is elevated at 20, repeat is 24, not significantly changed. Plan is to obtain urine to evaluate for UTI. Patient aware and will work on this, will push call bell when ready. Care signed out to oncoming provider pending UA.  I have reviewed the patients home medicines and have made adjustments as needed   Social  Determinants of Health:  Lives at home   Test / Admission - Considered:  Disposition pending at time of signout         Final Clinical Impression(s) / ED Diagnoses Final diagnoses:  Fall, initial encounter  COVID    Rx / DC Orders ED Discharge Orders     None  Jeannie Fend, PA-C 02/25/23 [id number]    Sloan Leiter, DO 02/25/23 [phone]

## 2023-02-25 NOTE — Discharge Instructions (Addendum)
Your urinalysis was concerning for UTI. You were given a dose of antibiotics here in ED and I am sending the rest of your antibiotic course to your pharmacy. Please follow up with your primary care provider. Seek emergency care if experiencing any new or worsening symptoms.

## 2023-02-26 LAB — URINE CULTURE

## 2023-03-12 ENCOUNTER — Encounter (HOSPITAL_BASED_OUTPATIENT_CLINIC_OR_DEPARTMENT_OTHER): Payer: Medicare PPO | Attending: Internal Medicine | Admitting: Internal Medicine

## 2023-03-12 DIAGNOSIS — I872 Venous insufficiency (chronic) (peripheral): Secondary | ICD-10-CM | POA: Insufficient documentation

## 2023-03-12 DIAGNOSIS — I890 Lymphedema, not elsewhere classified: Secondary | ICD-10-CM | POA: Insufficient documentation

## 2023-03-12 DIAGNOSIS — Z7901 Long term (current) use of anticoagulants: Secondary | ICD-10-CM | POA: Diagnosis not present

## 2023-03-12 DIAGNOSIS — L97812 Non-pressure chronic ulcer of other part of right lower leg with fat layer exposed: Secondary | ICD-10-CM | POA: Diagnosis not present

## 2023-03-12 DIAGNOSIS — L97818 Non-pressure chronic ulcer of other part of right lower leg with other specified severity: Secondary | ICD-10-CM | POA: Insufficient documentation

## 2023-03-13 NOTE — Progress Notes (Signed)
ability to obtain necessary supplies KESSLEY, RUGAR Koch ([id number]) [id number] Page 7 of 9 Assess patient/caregiver ability to perform ulcer/skin care regimen upon admission and as needed Assess ulceration(s) every visit Provide education on ulcer and skin care Notes: Electronic Signature(s) Signed: 03/13/2023 2:21:02 PM By: Redmond Pulling RN, BSN Entered By: Redmond Pulling on 03/12/2023 12:44:00 -------------------------------------------------------------------------------- Pain Assessment Details Patient  Name: Date of Service: Brittany Meier Koch. 03/12/2023 10:45 A M Medical Record Number: [id number] Patient Account Number: [id number] Date of Birth/Sex: Treating RN: [birth year] (82 y.o. [name] Primary Care Jeanclaude Wentworth: Hillard Danker Other Clinician: Referring Lucely Leard: Treating Keylie Beavers/Extender: Gwyneth Revels in Treatment: 0 Active Problems Location of Pain Severity and Description of Pain Patient Has Paino Yes Site Locations Rate the pain. Current Pain Level: 0 Worst Pain Level: 7 Pain Management and Medication Current Pain Management: Electronic Signature(s) Signed: 03/13/2023 2:21:02 PM By: Redmond Pulling RN, BSN Entered By: Redmond Pulling on 03/12/2023 11:54:40 -------------------------------------------------------------------------------- Patient/Caregiver Education Details Patient Name: Date of Service: [name] 9/24/2024andnbsp10:45 A M Medical Record Number: [id number] Patient Account Number: [id number] Date of Birth/Gender: Treating RN: 02-19-1941 (82 y.o. [name] Primary Care Physician: Hillard Danker Other Clinician: Referring Physician: Treating Physician/Extender: Gwyneth Revels in Treatment: 0 [name], [name] ([id number]) [id number] Page 8 of 9 Education Assessment Education Provided To: Patient and Caregiver Education Topics Provided Welcome T [facility] Packet: o Methods: Explain/Verbal Responses: State content correctly Wound/Skin Impairment: Methods: Explain/Verbal Responses: State content correctly Electronic Signature(s) Signed: 03/13/2023 2:21:02 PM By: Redmond Pulling RN, BSN Entered By: Redmond Pulling on 03/12/2023 12:03:26 -------------------------------------------------------------------------------- Wound Assessment Details Patient Name: Date of Service: Brittany Meier Koch. 03/12/2023 10:45 A M Medical Record Number:  [id number] Patient Account Number: [id number] Date of Birth/Sex: Treating RN: Jan 17, 1941 (82 y.o. [name] Primary Care Syrus Nakama: Hillard Danker Other Clinician: Referring Katlynn Naser: Treating Keshaun Dubey/Extender: Gwyneth Revels in Treatment: 0 Wound Status Wound Number: 1 Primary Etiology: Venous Leg Ulcer Wound Location: Right, Anterior Lower Leg Wound Status: Open Wounding Event: Gradually Appeared Date Acquired: 12/25/2022 Weeks Of Treatment: 0 Clustered Wound: No Photos Wound Measurements Length: (cm) 4 Width: (cm) 4.8 Depth: (cm) 0.1 Area: (cm) 15.08 Volume: (cm) 1.508 % Reduction in Area: % Reduction in Volume: Epithelialization: None Tunneling: No Undermining: No Wound Description Classification: Full Thickness Without Exposed Support Exudate Amount: Medium Exudate Type: Serosanguineous Exudate Color: red, brown [name], [name] ([id number]) Wound Bed Granulation Amount: Medium (34-66%) Granulation Quality: Red Necrotic Amount: Medium (34-66%) Necrotic Quality: Eschar, Adherent Slough Structures Foul Odor After Cleansing: No Slough/Fibrino Yes [id number].pdf Page 9 of 9 Exposed Structure Fascia Exposed: No Fat Layer (Subcutaneous Tissue) Exposed: Yes Tendon Exposed: No Muscle Exposed: No Joint Exposed: No Bone Exposed: No Periwound Skin Texture Texture Color No Abnormalities Noted: No No Abnormalities Noted: No Hemosiderin Staining: Yes Moisture No Abnormalities Noted: Yes Temperature / Pain Temperature: No Abnormality Electronic Signature(s) Signed: 03/13/2023 2:21:02 PM By: Redmond Pulling RN, BSN Entered By: Redmond Pulling on 03/12/2023 11:36:50 -------------------------------------------------------------------------------- Vitals Details Patient Name: Date of Service: Brittany Meier Koch. 03/12/2023 10:45 A M Medical Record Number: [id number] Patient Account Number: [id number] Date of Birth/Sex:  Treating RN: 1940-09-29 (82 y.o. [name] Primary Care Benay Pomeroy: Hillard Danker Other Clinician: Referring Tu Shimmel: Treating Jaimya Feliciano/Extender: Gwyneth Revels in Treatment: 0 Vital Signs Time Taken: 11:38 Temperature (F): 98.0 Pulse (bpm): 68 Respiratory Rate (breaths/min): 18 Blood Pressure (mmHg): 186/71  ability to obtain necessary supplies KESSLEY, RUGAR Koch ([id number]) [id number] Page 7 of 9 Assess patient/caregiver ability to perform ulcer/skin care regimen upon admission and as needed Assess ulceration(s) every visit Provide education on ulcer and skin care Notes: Electronic Signature(s) Signed: 03/13/2023 2:21:02 PM By: Redmond Pulling RN, BSN Entered By: Redmond Pulling on 03/12/2023 12:44:00 -------------------------------------------------------------------------------- Pain Assessment Details Patient  Name: Date of Service: Brittany Meier Koch. 03/12/2023 10:45 A M Medical Record Number: [id number] Patient Account Number: [id number] Date of Birth/Sex: Treating RN: [birth year] (82 y.o. [name] Primary Care Jeanclaude Wentworth: Hillard Danker Other Clinician: Referring Lucely Leard: Treating Keylie Beavers/Extender: Gwyneth Revels in Treatment: 0 Active Problems Location of Pain Severity and Description of Pain Patient Has Paino Yes Site Locations Rate the pain. Current Pain Level: 0 Worst Pain Level: 7 Pain Management and Medication Current Pain Management: Electronic Signature(s) Signed: 03/13/2023 2:21:02 PM By: Redmond Pulling RN, BSN Entered By: Redmond Pulling on 03/12/2023 11:54:40 -------------------------------------------------------------------------------- Patient/Caregiver Education Details Patient Name: Date of Service: [name] 9/24/2024andnbsp10:45 A M Medical Record Number: [id number] Patient Account Number: [id number] Date of Birth/Gender: Treating RN: 02-19-1941 (82 y.o. [name] Primary Care Physician: Hillard Danker Other Clinician: Referring Physician: Treating Physician/Extender: Gwyneth Revels in Treatment: 0 [name], [name] ([id number]) [id number] Page 8 of 9 Education Assessment Education Provided To: Patient and Caregiver Education Topics Provided Welcome T [facility] Packet: o Methods: Explain/Verbal Responses: State content correctly Wound/Skin Impairment: Methods: Explain/Verbal Responses: State content correctly Electronic Signature(s) Signed: 03/13/2023 2:21:02 PM By: Redmond Pulling RN, BSN Entered By: Redmond Pulling on 03/12/2023 12:03:26 -------------------------------------------------------------------------------- Wound Assessment Details Patient Name: Date of Service: Brittany Meier Koch. 03/12/2023 10:45 A M Medical Record Number:  [id number] Patient Account Number: [id number] Date of Birth/Sex: Treating RN: Jan 17, 1941 (82 y.o. [name] Primary Care Syrus Nakama: Hillard Danker Other Clinician: Referring Katlynn Naser: Treating Keshaun Dubey/Extender: Gwyneth Revels in Treatment: 0 Wound Status Wound Number: 1 Primary Etiology: Venous Leg Ulcer Wound Location: Right, Anterior Lower Leg Wound Status: Open Wounding Event: Gradually Appeared Date Acquired: 12/25/2022 Weeks Of Treatment: 0 Clustered Wound: No Photos Wound Measurements Length: (cm) 4 Width: (cm) 4.8 Depth: (cm) 0.1 Area: (cm) 15.08 Volume: (cm) 1.508 % Reduction in Area: % Reduction in Volume: Epithelialization: None Tunneling: No Undermining: No Wound Description Classification: Full Thickness Without Exposed Support Exudate Amount: Medium Exudate Type: Serosanguineous Exudate Color: red, brown [name], [name] ([id number]) Wound Bed Granulation Amount: Medium (34-66%) Granulation Quality: Red Necrotic Amount: Medium (34-66%) Necrotic Quality: Eschar, Adherent Slough Structures Foul Odor After Cleansing: No Slough/Fibrino Yes [id number].pdf Page 9 of 9 Exposed Structure Fascia Exposed: No Fat Layer (Subcutaneous Tissue) Exposed: Yes Tendon Exposed: No Muscle Exposed: No Joint Exposed: No Bone Exposed: No Periwound Skin Texture Texture Color No Abnormalities Noted: No No Abnormalities Noted: No Hemosiderin Staining: Yes Moisture No Abnormalities Noted: Yes Temperature / Pain Temperature: No Abnormality Electronic Signature(s) Signed: 03/13/2023 2:21:02 PM By: Redmond Pulling RN, BSN Entered By: Redmond Pulling on 03/12/2023 11:36:50 -------------------------------------------------------------------------------- Vitals Details Patient Name: Date of Service: Brittany Meier Koch. 03/12/2023 10:45 A M Medical Record Number: [id number] Patient Account Number: [id number] Date of Birth/Sex:  Treating RN: 1940-09-29 (82 y.o. [name] Primary Care Benay Pomeroy: Hillard Danker Other Clinician: Referring Tu Shimmel: Treating Jaimya Feliciano/Extender: Gwyneth Revels in Treatment: 0 Vital Signs Time Taken: 11:38 Temperature (F): 98.0 Pulse (bpm): 68 Respiratory Rate (breaths/min): 18 Blood Pressure (mmHg): 186/71  ability to obtain necessary supplies KESSLEY, RUGAR Koch ([id number]) [id number] Page 7 of 9 Assess patient/caregiver ability to perform ulcer/skin care regimen upon admission and as needed Assess ulceration(s) every visit Provide education on ulcer and skin care Notes: Electronic Signature(s) Signed: 03/13/2023 2:21:02 PM By: Redmond Pulling RN, BSN Entered By: Redmond Pulling on 03/12/2023 12:44:00 -------------------------------------------------------------------------------- Pain Assessment Details Patient  Name: Date of Service: Brittany Meier Koch. 03/12/2023 10:45 A M Medical Record Number: [id number] Patient Account Number: [id number] Date of Birth/Sex: Treating RN: [birth year] (82 y.o. [name] Primary Care Jeanclaude Wentworth: Hillard Danker Other Clinician: Referring Lucely Leard: Treating Keylie Beavers/Extender: Gwyneth Revels in Treatment: 0 Active Problems Location of Pain Severity and Description of Pain Patient Has Paino Yes Site Locations Rate the pain. Current Pain Level: 0 Worst Pain Level: 7 Pain Management and Medication Current Pain Management: Electronic Signature(s) Signed: 03/13/2023 2:21:02 PM By: Redmond Pulling RN, BSN Entered By: Redmond Pulling on 03/12/2023 11:54:40 -------------------------------------------------------------------------------- Patient/Caregiver Education Details Patient Name: Date of Service: [name] 9/24/2024andnbsp10:45 A M Medical Record Number: [id number] Patient Account Number: [id number] Date of Birth/Gender: Treating RN: 02-19-1941 (82 y.o. [name] Primary Care Physician: Hillard Danker Other Clinician: Referring Physician: Treating Physician/Extender: Gwyneth Revels in Treatment: 0 [name], [name] ([id number]) [id number].pdf Page 8 of 9 Education Assessment Education Provided To: Patient and Caregiver Education Topics Provided Welcome T [facility] Packet: o Methods: Explain/Verbal Responses: State content correctly Wound/Skin Impairment: Methods: Explain/Verbal Responses: State content correctly Electronic Signature(s) Signed: 03/13/2023 2:21:02 PM By: Redmond Pulling RN, BSN Entered By: Redmond Pulling on 03/12/2023 12:03:26 -------------------------------------------------------------------------------- Wound Assessment Details Patient Name: Date of Service: Brittany Meier Koch. 03/12/2023 10:45 A M Medical Record Number:  [id number] Patient Account Number: [id number] Date of Birth/Sex: Treating RN: Jan 17, 1941 (82 y.o. [name] Primary Care Syrus Nakama: Hillard Danker Other Clinician: Referring Katlynn Naser: Treating Keshaun Dubey/Extender: Gwyneth Revels in Treatment: 0 Wound Status Wound Number: 1 Primary Etiology: Venous Leg Ulcer Wound Location: Right, Anterior Lower Leg Wound Status: Open Wounding Event: Gradually Appeared Date Acquired: 12/25/2022 Weeks Of Treatment: 0 Clustered Wound: No Photos Wound Measurements Length: (cm) 4 Width: (cm) 4.8 Depth: (cm) 0.1 Area: (cm) 15.08 Volume: (cm) 1.508 % Reduction in Area: % Reduction in Volume: Epithelialization: None Tunneling: No Undermining: No Wound Description Classification: Full Thickness Without Exposed Support Exudate Amount: Medium Exudate Type: Serosanguineous Exudate Color: red, brown [name], [name] ([id number]) Wound Bed Granulation Amount: Medium (34-66%) Granulation Quality: Red Necrotic Amount: Medium (34-66%) Necrotic Quality: Eschar, Adherent Slough Structures Foul Odor After Cleansing: No Slough/Fibrino Yes [id number].pdf Page 9 of 9 Exposed Structure Fascia Exposed: No Fat Layer (Subcutaneous Tissue) Exposed: Yes Tendon Exposed: No Muscle Exposed: No Joint Exposed: No Bone Exposed: No Periwound Skin Texture Texture Color No Abnormalities Noted: No No Abnormalities Noted: No Hemosiderin Staining: Yes Moisture No Abnormalities Noted: Yes Temperature / Pain Temperature: No Abnormality Electronic Signature(s) Signed: 03/13/2023 2:21:02 PM By: Redmond Pulling RN, BSN Entered By: Redmond Pulling on 03/12/2023 11:36:50 -------------------------------------------------------------------------------- Vitals Details Patient Name: Date of Service: Brittany Meier Koch. 03/12/2023 10:45 A M Medical Record Number: [id number] Patient Account Number: [id number] Date of Birth/Sex:  Treating RN: 1940-09-29 (82 y.o. [name] Primary Care Benay Pomeroy: Hillard Danker Other Clinician: Referring Tu Shimmel: Treating Jaimya Feliciano/Extender: Gwyneth Revels in Treatment: 0 Vital Signs Time Taken: 11:38 Temperature (F): 98.0 Pulse (bpm): 68 Respiratory Rate (breaths/min): 18 Blood Pressure (mmHg): 186/71  [name], [name] ([id number]) [id number].pdf Page 1 of 9 Visit Report for 03/12/2023 Allergy List Details Patient Name: Date of Service: [facility] BREYON, BAPST 03/12/2023 10:45 A M Medical Record Number: [id number] Patient Account Number: [id number] Date of Birth/Sex: Treating RN: 1941/04/13 (82 y.o. [name] Primary Care Maryann Mccall: Hillard Danker Other Clinician: Referring Saree Krogh: Treating Shant Hence/Extender: Gwyneth Revels in Treatment: 0 Allergies Active Allergies No Known Allergies Allergy Notes Electronic Signature(s) Signed: 03/13/2023 2:21:02 PM By: Redmond Pulling RN, BSN Entered By: Redmond Pulling on 03/12/2023 11:40:59 -------------------------------------------------------------------------------- Arrival Information Details Patient Name: Date of Service: Brittany Meier Koch. 03/12/2023 10:45 A M Medical Record Number: [id number] Patient Account Number: [id number] Date of Birth/Sex: Treating RN: 1940-08-07 (82 y.o. [name] Primary Care Nicolaos Mitrano: Hillard Danker Other Clinician: Referring India Jolin: Treating Mccade Sullenberger/Extender: Gwyneth Revels in Treatment: 0 Visit Information Patient Arrived: Cloyde Reams Time: 11:18 Accompanied By: Sister Transfer Assistance: None Patient Identification Verified: Yes Patient Has Alerts: Yes Patient Alerts: Patient on Blood Thinner eliquis Electronic Signature(s) Signed: 03/13/2023 2:21:02 PM By: Redmond Pulling RN, BSN Entered By: Redmond Pulling on 03/12/2023 12:04:21 -------------------------------------------------------------------------------- Clinic Level of Care Assessment Details Patient Name: Date of Service: [facility] Koch. 03/12/2023 10:45 A M Medical Record Number: [id number] Patient Account Number: [id number] [name], [name] ([id number]) [phone].pdf Page 2 of 9 Date of Birth/Sex: Treating RN: [birth year] (82  y.o. [name] Primary Care Marwah Disbro: Hillard Danker Other Clinician: Referring Annabelle Rexroad: Treating Yong Wahlquist/Extender: Gwyneth Revels in Treatment: 0 Clinic Level of Care Assessment Items TOOL 3 Quantity Score X- 1 0 Use when EandM and Procedure is performed on FOLLOW-UP visit ASSESSMENTS - Nursing Assessment / Reassessment X- 1 10 Reassessment of Co-morbidities (includes updates in patient status) X- 1 5 Reassessment of Adherence to Treatment Plan ASSESSMENTS - Wound and Skin Assessment / Reassessment []  - Points for Wound Assessment can only be taken for a new wound of unknown or different etiology and a procedure is 0 NOT performed to that wound X- 1 5 Simple Wound Assessment / Reassessment - one wound []  - 0 Complex Wound Assessment / Reassessment - multiple wounds []  - 0 Dermatologic / Skin Assessment (not related to wound area) ASSESSMENTS - Focused Assessment X- 1 5 Circumferential Edema Measurements - multi extremities []  - 0 Nutritional Assessment / Counseling / Intervention []  - 0 Lower Extremity Assessment (monofilament, tuning fork, pulses) []  - 0 Peripheral Arterial Disease Assessment (using hand held doppler) ASSESSMENTS - Ostomy and/or Continence Assessment and Care []  - 0 Incontinence Assessment and Management []  - 0 Ostomy Care Assessment and Management (repouching, etc.) PROCESS - Coordination of Care []  - Points for Discharge Coordination can only be taken for a new wound of unknown or different etiology and a procedure 0 is NOT performed to that wound []  - 0 Simple Patient / Family Education for ongoing care []  - 0 Complex (extensive) Patient / Family Education for ongoing care X- 1 10 Staff obtains Chiropractor, Records, T Results / Process Orders est []  - 0 Staff telephones HHA, Nursing Homes / Clarify orders / etc []  - 0 Routine Transfer to another Facility (non-emergent condition) []  - 0 Routine Hospital Admission  (non-emergent condition) X- 1 15 New Admissions / Manufacturing engineer / Ordering NPWT Apligraf, etc. , []  - 0 Emergency Hospital Admission (emergent condition) []  - 0 Simple Discharge Coordination []  - 0 Complex (extensive) Discharge Coordination PROCESS - Special Needs []  - 0 Pediatric /  Minor Patient Management []  - 0 Isolation Patient Management []  - 0 Hearing / Language / Visual special needs []  - 0 Assessment of Community assistance (transportation, Koch/C planning, etc.) []  - 0 Additional assistance / Altered mentation []  - 0 Support Surface(s) Assessment (bed, cushion, seat, etc.) INTERVENTIONS - Wound Cleansing / Measurement []  - Points for Wound Cleaning / Measurement, Wound Dressing, Specimen Collection and Specimen taken to lab can only 0 be taken for a new wound of unknown or different etiology and a procedure is NOT performed to that wound X- 1 5 Simple Wound Cleansing - one wound [name], [name] ([id number]) [id number].pdf Page 3 of 9 []  - 0 Complex Wound Cleansing - multiple wounds X- 1 5 Wound Imaging (photographs - any number of wounds) []  - 0 Wound Tracing (instead of photographs) X- 1 5 Simple Wound Measurement - one wound []  - 0 Complex Wound Measurement - multiple wounds INTERVENTIONS - Wound Dressings []  - 0 Small Wound Dressing one or multiple wounds []  - 0 Medium Wound Dressing one or multiple wounds X- 1 20 Large Wound Dressing one or multiple wounds INTERVENTIONS - Miscellaneous []  - 0 External ear exam []  - 0 Specimen Collection (cultures, biopsies, blood, body fluids, etc.) []  - 0 Specimen(s) / Culture(s) sent or taken to Lab for analysis []  - 0 Patient Transfer (multiple staff / Nurse, adult / Similar devices) []  - 0 Simple Staple / Suture removal (25 or less) []  - 0 Complex Staple / Suture removal (26 or more) []  - 0 Hypo / Hyperglycemic Management (close monitor of Blood Glucose) X- 1 15 Ankle /  Brachial Index (ABI) - do not check if billed separately X- 1 5 Vital Signs Has the patient been seen at the hospital within the last three years: Yes Total Score: 105 Level Of Care: New/Established - Level 3 Electronic Signature(s) Signed: 03/13/2023 2:21:02 PM By: Redmond Pulling RN, BSN Entered By: Redmond Pulling on 03/12/2023 12:46:39 -------------------------------------------------------------------------------- Compression Therapy Details Patient Name: Date of Service: Brittany Meier Koch. 03/12/2023 10:45 A M Medical Record Number: [id number] Patient Account Number: [id number] Date of Birth/Sex: Treating RN: 07/29/1940 (82 y.o. [name] Primary Care Kanita Delage: Hillard Danker Other Clinician: Referring Josie Burleigh: Treating Bawi Lakins/Extender: Gwyneth Revels in Treatment: 0 Compression Therapy Performed for Wound Assessment: Wound #1 Right,Anterior Lower Leg Performed By: Clinician Redmond Pulling, RN Compression Type: Four Layer Post Procedure Diagnosis Same as Pre-procedure Electronic Signature(s) Signed: 03/13/2023 2:21:02 PM By: Redmond Pulling RN, BSN Entered By: Redmond Pulling on 03/12/2023 12:41:39 [name] ([id number]) [id number].pdf Page 4 of 9 -------------------------------------------------------------------------------- Encounter Discharge Information Details Patient Name: Date of Service: [name], [name] 03/12/2023 10:45 A M Medical Record Number: [id number] Patient Account Number: [id number] Date of Birth/Sex: Treating RN: 07-21-1940 (82 y.o. [name] Primary Care Denitra Donaghey: Hillard Danker Other Clinician: Referring Prateek Knipple: Treating Obrian Bulson/Extender: Gwyneth Revels in Treatment: 0 Encounter Discharge Information Items Discharge Condition: Stable Ambulatory Status: Walker Discharge Destination: Home Transportation: Private Auto Accompanied By: sister Schedule  Follow-up Appointment: Yes Clinical Summary of Care: Patient Declined Electronic Signature(s) Signed: 03/13/2023 2:21:02 PM By: Redmond Pulling RN, BSN Entered By: Redmond Pulling on 03/12/2023 16:29:36 -------------------------------------------------------------------------------- Lower Extremity Assessment Details Patient Name: Date of Service: Brittany Meier Koch. 03/12/2023 10:45 A M Medical Record Number: [id number] Patient Account Number: [id number] Date of Birth/Sex: Treating RN: 1940-12-18 (82 y.o. [name] Primary Care Railyn House: Hillard Danker Other Clinician: Referring Abbee Cremeens: Treating Bobie Caris/Extender: Leanord Hawking

## 2023-03-13 NOTE — Progress Notes (Signed)
[name], [name] ([id number]) [phone].pdf Page 1 of 4 Visit Report for 03/12/2023 Abuse Risk Screen Details Patient Name: Date of Service: [name] [name], [name] 03/12/2023 10:45 A M Medical Record Number: [id number] Patient Account Number: [id number] Date of Birth/Sex: Treating RN: [birth year] (82 y.o. [name] Primary Care [name]: [name] Other Clinician: Referring [name]: Treating [name]/Extender: [name]: 0 Abuse Risk Screen Items Answer ABUSE RISK SCREEN: Has anyone close to you tried to hurt or harm you recentlyo No Do you feel uncomfortable with anyone in your familyo No Has anyone forced you do things that you didnt want to doo No Electronic Signature(s) Signed: 03/13/2023 2:21:02 PM By: Redmond Pulling RN, BSN Entered By: Redmond Pulling on 03/12/2023 11:49:01 -------------------------------------------------------------------------------- Activities of Daily Living Details Patient Name: Date of Service: [name] [name], [name] 03/12/2023 10:45 A M Medical Record Number: [id number] Patient Account Number: [id number] Date of Birth/Sex: Treating RN: [name] (82 y.o. [name] Primary Care Wandalene Abrams: [name] Other Clinician: Referring [name]: Treating [name] in Koch: 0 Activities of Daily Living Items Answer Activities of Daily Living (Please select one for each item) Drive Automobile Not Able T Medications ake Need Assistance Use T elephone Need Assistance Care for Appearance Completely Able Use T oilet Completely Able Bath / Shower Completely Able Dress Self Completely Able Feed Self Completely Able Walk Need Assistance Get In / Out Bed Need Assistance Housework Need Assistance Prepare Meals Need Assistance Handle Money Need Assistance Shop for Self Need Assistance Electronic Signature(s) Signed:  03/13/2023 2:21:02 PM By: Redmond Pulling RN, BSN Entered By: Redmond Pulling on 03/12/2023 11:50:08 [name] ([id number]) [phone] [id number] Page 2 of 4 -------------------------------------------------------------------------------- Education Screening Details Patient Name: Date of Service: [name], [name] 03/12/2023 10:45 A M Medical Record Number: [id number] Patient Account Number: [id number] Date of Birth/Sex: Treating RN: [birth year] (82 y.o. [name] Primary Care Anessia Oakland: [name] Other Clinician: Referring Porche Steinberger: Treating [name]/Extender: [name]: 0 Primary Learner Assessed: Patient Learning Preferences/Education Level/Primary Language Learning Preference: Explanation, Demonstration, Printed Material Preferred Language: English Cognitive Barrier Language Barrier: No Translator Needed: No Memory Deficit: No Emotional Barrier: No Cultural/Religious Beliefs Affecting Medical Care: No Physical Barrier Impaired Vision: Yes Glasses Impaired Hearing: No Decreased Hand dexterity: No Knowledge/Comprehension Knowledge Level: High Comprehension Level: High Ability to understand written instructions: High Ability to understand verbal instructions: High Motivation Anxiety Level: Calm Cooperation: Cooperative Education Importance: Acknowledges Need Interest in Health Problems: Asks Questions Perception: Coherent Willingness to Engage in Self-Management High Activities: Readiness to Engage in Self-Management High Activities: Electronic Signature(s) Signed: 03/13/2023 2:21:02 PM By: Redmond Pulling RN, BSN Entered By: Redmond Pulling on 03/12/2023 11:51:20 -------------------------------------------------------------------------------- Fall Risk Assessment Details Patient Name: Date of Service: Brittany Meier Koch. 03/12/2023 10:45 A M Medical Record Number: [id number] Patient  Account Number: [id number] Date of Birth/Sex: Treating RN: [birth year] (82 y.o. [name] Primary Care [name]: [name] Other Clinician: Referring [name]: Treating [name]/Extender: [name]: 0 Fall Risk Assessment Items Have you had 2 or more falls in the last 12 monthso 0 Yes Have you had any fall that resulted in injury in the last [address] [name], [name] ([id number]) [phone] [id number] Page 3 of 4 FALLS RISK SCREEN History of falling - immediate or within 3 months 25 Yes Secondary diagnosis (Do you have 2 or more medical diagnoseso) 15  Yes Ambulatory aid None/bed rest/wheelchair/nurse 0 No Crutches/cane/walker 15 Yes Furniture 0 No Intravenous therapy Access/Saline/Heparin Lock 0 No Gait/Transferring Normal/ bed rest/ wheelchair 0 No Weak (short steps with or without shuffle, stooped but able to lift head while walking, may seek 10 Yes support from furniture) Impaired (short steps with shuffle, may have difficulty arising from chair, head down, impaired 0 No balance) Mental Status Oriented to own ability 0 Yes Electronic Signature(s) Signed: 03/13/2023 2:21:02 PM By: Redmond Pulling RN, BSN Entered By: Redmond Pulling on 03/12/2023 11:51:54 -------------------------------------------------------------------------------- Foot Assessment Details Patient Name: Date of Service: Brittany Meier Koch. 03/12/2023 10:45 A M Medical Record Number: [id number] Patient Account Number: [id number] Date of Birth/Sex: Treating RN: [birth year] (82 y.o. [name] Primary Care [name]: [name] Other Clinician: Referring [name]: Treating [name] in Koch: 0 Foot Assessment Items Site Locations + = Sensation present, - = Sensation absent, C = Callus, U = Ulcer R = Redness, W = Warmth, M = Maceration, PU = Pre-ulcerative lesion F =  Fissure, S = Swelling, Koch = Dryness Assessment Right: Left: Other Deformity: No No Prior Foot Ulcer: No No Prior Amputation: No No Charcot Joint: No No Ambulatory Status: Ambulatory With Help Assistance Device: Walker GaitLAUNIE, [name] ([id number]) [phone].pdf Page 4 of 4 Electronic Signature(s) Signed: 03/13/2023 2:21:02 PM By: Redmond Pulling RN, BSN Entered By: Redmond Pulling on 03/12/2023 11:54:04 -------------------------------------------------------------------------------- Nutrition Risk Screening Details Patient Name: Date of Service: [facility] [name], [name] 03/12/2023 10:45 A M Medical Record Number: [id number] Patient Account Number: [id number] Date of Birth/Sex: Treating RN: [birth year] (82 y.o. [name] Primary Care Aalijah Mims: [name] Other Clinician: Referring Carlin Mamone: Treating Brycelyn Gambino/Extender: [name]: 0 Height (in): Weight (lbs): Body Mass Index (BMI): Nutrition Risk Screening Items Score Screening NUTRITION RISK SCREEN: I have an illness or condition that made me change the kind and/or amount of food I eat 0 No I eat fewer than two meals per day 0 No I eat few fruits and vegetables, or milk products 0 No I have three or more drinks of beer, liquor or wine almost every day 0 No I have tooth or mouth problems that make it hard for me to eat 0 No I don't always have enough money to buy the food I need 0 No I eat alone most of the time 0 No I take three or more different prescribed or over-the-counter drugs a day 1 Yes Without wanting to, I have lost or gained 10 pounds in the last six months 0 No I am not always physically able to shop, cook and/or feed myself 0 No Nutrition Protocols Good Risk Protocol Moderate Risk Protocol High Risk Proctocol Risk Level: Good Risk Score: 1 Electronic Signature(s) Signed: 03/13/2023 2:21:02 PM By: Redmond Pulling RN,  BSN Entered By: Redmond Pulling on 03/12/2023 11:52:39

## 2023-03-14 DIAGNOSIS — I890 Lymphedema, not elsewhere classified: Secondary | ICD-10-CM | POA: Diagnosis not present

## 2023-03-15 ENCOUNTER — Ambulatory Visit (HOSPITAL_BASED_OUTPATIENT_CLINIC_OR_DEPARTMENT_OTHER): Payer: Medicare PPO | Admitting: General Surgery

## 2023-03-15 NOTE — Progress Notes (Signed)
SOSHA, SHEPHERD ([id number]) [id number] Page 1 of 8 Visit Report for 03/12/2023 Chief Complaint Document Details Patient Name: Date of Service: [facility] [name], [name] 03/12/2023 10:45 A M Medical Record Number: [id number] Patient Account Number: [id number] Date of Birth/Sex: Treating RN: Aug 21, 1940 (82 y.o. F) Primary Care Provider: Hillard Danker Other Clinician: Referring Provider: Treating Provider/Extender: Gwyneth Revels in Treatment: 0 Information Obtained from: Patient Chief Complaint 03/12/2023; patient arrives in clinic today for review of wound on her anterior right lower extremity Electronic Signature(s) Signed: 03/12/2023 4:52:49 PM By: Baltazar Najjar MD Entered By: Baltazar Najjar on 03/12/2023 12:40:41 -------------------------------------------------------------------------------- HPI Details Patient Name: Date of Service: Brittany Meier Koch. 03/12/2023 10:45 A M Medical Record Number: [id number] Patient Account Number: [id number] Date of Birth/Sex: Treating RN: 1940/10/07 (82 y.o. F) Primary Care Provider: Hillard Danker Other Clinician: Referring Provider: Treating Provider/Extender: Gwyneth Revels in Treatment: 0 History of Present Illness HPI Description: ADMISSION 03/08/2023 This is an 82 year old woman who arrived accompanied by her sister and her brother. She is here for review of a wound on the right anterior lower leg which apparently has been there for about a month. She was seen in the ER on 02/25/2023 and treated with Keflex for cellulitis. She arrives in clinic today complaining of pain and swelling in the nonhealing wound on the right anterior mid tibial area In discussion with the patient it is apparent that she has had edema of her right greater than left leg for prolonged period of time measuring years. I see that she had a DVT rule out on 08/17/2022 that was negative in the right leg.  Apparently after seeing Dr. Nehemiah Settle in late August she had another DVT rule out that was positive and she is on Eliquis. Past medical history includes falls, Eliquis for DVT although I cannot find the exact location, osteoporosis, osteoarthritis, cataracts ABI in our clinic was 0.7 on the right Electronic Signature(s) Signed: 03/12/2023 4:52:49 PM By: Baltazar Najjar MD Entered By: Baltazar Najjar on 03/12/2023 12:43:23 [name] ([id number]) [id number] Page 2 of 8 -------------------------------------------------------------------------------- Physical Exam Details Patient Name: Date of Service: [name], [name] 03/12/2023 10:45 A M Medical Record Number: [id number] Patient Account Number: [id number] Date of Birth/Sex: Treating RN: 1940/07/11 (82 y.o. F) Primary Care Provider: Hillard Danker Other Clinician: Referring Provider: Treating Provider/Extender: Gwyneth Revels in Treatment: 0 Cardiovascular Peripheral pulses are easily palpable on the right foot dorsalis pedis. Edema present in both extremities.This this is tense and nonpitting. No palpable tenderness. There is lesser changes on the left side. Notes Wound exam; on the right anterior mid tibia wound is about the size of a quarter. It is superficial I was able to wipe off adherent eschar and some slough with Vashe and gauze. No mechanical debridement was otherwise necessary. I do not think there is active cellulitis at present however around the wound there is what looks to be hemosiderin with stasis dermatitis Electronic Signature(s) Signed: 03/12/2023 4:52:49 PM By: Baltazar Najjar MD Entered By: Baltazar Najjar on 03/12/2023 12:46:09 -------------------------------------------------------------------------------- Physician Orders Details Patient Name: Date of Service: Brittany Meier Koch. 03/12/2023 10:45 A M Medical Record Number: [id number] Patient Account  Number: [id number] Date of Birth/Sex: Treating RN: 03-Feb-1941 (82 y.o. [name] Primary Care Provider: Hillard Danker Other Clinician: Referring Provider: Treating Provider/Extender: Gwyneth Revels in Treatment: 0 Verbal / Phone Orders: No Diagnosis Coding ICD-10 Coding Code Description L97.818 Non-pressure chronic  ulcer of other part of right lower leg with other specified severity I89.0 Lymphedema, not elsewhere classified Follow-up Appointments ppointment in 1 week. - Dr Leanord Hawking - Monday 03/18/23 @ 1000 Return A Nurse Visit: - Friday 10:15 03/15/23 Other: - will order juxtalites from [facility] Anesthetic Wound #1 Right,Anterior Lower Leg (In clinic) Topical Lidocaine 5% applied to wound bed Bathing/ Shower/ Hygiene May shower with protection but do not get wound dressing(s) wet. Protect dressing(s) with water repellant cover (for example, large plastic bag) or a cast cover and may then take shower. Edema Control - Lymphedema / SCD / Other Bilateral Lower Extremities Elevate legs to the level of the heart or above for 30 minutes daily and/or when sitting for 3-4 times a day throughout the day. Avoid standing for long periods of time. Exercise regularly Moisturize legs daily. Wound Treatment Wound #1 - Lower Leg Wound Laterality: Right, Anterior Cleanser: Soap and Water 1 x Per Week/30 Days Discharge Instructions: May shower and wash wound with dial antibacterial soap and water prior to dressing change. Cleanser: Vashe 5.8 (oz) 1 x Per Week/30 Days Discharge Instructions: Cleanse the wound with Vashe prior to applying a clean dressing using gauze sponges, not tissue or cotton balls. [name], [name] ([id number]) [id number] Page 3 of 8 Peri-Wound Care: Triamcinolone 15 (g) 1 x Per Week/30 Days Discharge Instructions: Use triamcinolone 15 (g) as directed Peri-Wound Care: Sween Lotion (Moisturizing lotion) 1 x Per Week/30  Days Discharge Instructions: Apply moisturizing lotion as directed Prim Dressing: Hydrofera Blue Ready Transfer Foam, 2.5x2.5 (in/in) 1 x Per Week/30 Days ary Discharge Instructions: Apply directly to wound bed as directed Secondary Dressing: ABD Pad, 8x10 1 x Per Week/30 Days Discharge Instructions: Apply over primary dressing as directed. Compression Wrap: Urgo K2, (equivalent to a 4 layer) two layer compression system, regular 1 x Per Week/30 Days Discharge Instructions: Apply Urgo K2 as directed (alternative to 4 layer compression). Patient Medications llergies: No Known Allergies A Notifications Medication Indication Start End 03/12/2023 lidocaine DOSE topical 5 % ointment - ointment topical once daily Electronic Signature(s) Signed: 03/13/2023 2:21:02 PM By: Redmond Pulling RN, BSN Signed: 03/15/2023 9:31:55 AM By: Baltazar Najjar MD Previous Signature: 03/12/2023 4:52:49 PM Version By: Baltazar Najjar MD Entered By: Redmond Pulling on 03/13/2023 14:12:54 -------------------------------------------------------------------------------- Problem List Details Patient Name: Date of Service: Brittany Meier Koch. 03/12/2023 10:45 A M Medical Record Number: [id number] Patient Account Number: [id number] Date of Birth/Sex: Treating RN: 1940-09-27 (82 y.o. F) Primary Care Provider: Hillard Danker Other Clinician: Referring Provider: Treating Provider/Extender: Gwyneth Revels in Treatment: 0 Active Problems ICD-10 Encounter Code Description Active Date MDM Diagnosis L97.818 Non-pressure chronic ulcer of other part of right lower leg with other specified 03/12/2023 No Yes severity I89.0 Lymphedema, not elsewhere classified 03/12/2023 No Yes Inactive Problems Resolved Problems Electronic Signature(s) Signed: 03/12/2023 4:52:49 PM By: Baltazar Najjar MD Entered By: Baltazar Najjar on 03/12/2023 12:39:45 [name] ([id number])  [id number] Page 4 of 8 -------------------------------------------------------------------------------- Progress Note Details Patient Name: Date of Service: [name], [name] 03/12/2023 10:45 A M Medical Record Number: [id number] Patient Account Number: [id number] Date of Birth/Sex: Treating RN: 1941/04/14 (82 y.o. F) Primary Care Provider: Hillard Danker Other Clinician: Referring Provider: Treating Provider/Extender: Gwyneth Revels in Treatment: 0 Subjective Chief Complaint Information obtained from Patient 03/12/2023; patient arrives in clinic today for review of wound on her anterior right lower extremity History of Present Illness (HPI) ADMISSION 03/08/2023 This is an 82 year old woman who  arrived accompanied by her sister and her brother. She is here for review of a wound on the right anterior lower leg which apparently has been there for about a month. She was seen in the ER on 02/25/2023 and treated with Keflex for cellulitis. She arrives in clinic today complaining of pain and swelling in the nonhealing wound on the right anterior mid tibial area In discussion with the patient it is apparent that she has had edema of her right greater than left leg for prolonged period of time measuring years. I see that she had a DVT rule out on 08/17/2022 that was negative in the right leg. Apparently after seeing Dr. Nehemiah Settle in late August she had another DVT rule out that was positive and she is on Eliquis. Past medical history includes falls, Eliquis for DVT although I cannot find the exact location, osteoporosis, osteoarthritis, cataracts ABI in our clinic was 0.7 on the right Patient History Allergies No Known Allergies Family History Cancer - Mother, Thyroid Problems - Mother. Social History Former smoker - quit 10 yrs ago, Marital Status - Married, Alcohol Use - Never, Drug Use - No History, Caffeine Use - Daily. Medical  History Eyes Patient has history of Cataracts Denies history of Glaucoma Cardiovascular Patient has history of Peripheral Venous Disease Musculoskeletal Patient has history of Osteoarthritis Medical A Surgical History Notes nd Eyes AMD right eye Musculoskeletal osteopenia Review of Systems (ROS) Constitutional Symptoms (General Health) Denies complaints or symptoms of Fatigue, Fever, Chills, Marked Weight Change. Eyes Complains or has symptoms of Glasses / Contacts. Ear/Nose/Mouth/Throat Denies complaints or symptoms of Chronic sinus problems or rhinitis. Respiratory Denies complaints or symptoms of Chronic or frequent coughs, Shortness of Breath. Gastrointestinal Denies complaints or symptoms of Frequent diarrhea, Nausea, Vomiting. Endocrine Denies complaints or symptoms of Heat/cold intolerance. Genitourinary Denies complaints or symptoms of Frequent urination. Integumentary (Skin) Complains or has symptoms of Wounds - right LE. Musculoskeletal Denies complaints or symptoms of Muscle Pain, Muscle Weakness. Neurologic Denies complaints or symptoms of Numbness/parasthesias. Psychiatric Denies complaints or symptoms of Claustrophobia. [name], [name] ([id number]) [id number] Page 5 of 8 Objective Constitutional Vitals Time Taken: 11:38 AM, Temperature: 98.0 F, Pulse: 68 bpm, Respiratory Rate: 18 breaths/min, Blood Pressure: 186/71 mmHg. Cardiovascular Peripheral pulses are easily palpable on the right foot dorsalis pedis. Edema present in both extremities.This this is tense and nonpitting. No palpable tenderness. There is lesser changes on the left side. General Notes: Wound exam; on the right anterior mid tibia wound is about the size of a quarter. It is superficial I was able to wipe off adherent eschar and some slough with Vashe and gauze. No mechanical debridement was otherwise necessary. I do not think there is active cellulitis at  present however around the wound there is what looks to be hemosiderin with stasis dermatitis Integumentary (Hair, Skin) Wound #1 status is Open. Original cause of wound was Gradually Appeared. The date acquired was: 12/25/2022. The wound is located on the Right,Anterior Lower Leg. The wound measures 4cm length x 4.8cm width x 0.1cm depth; 15.08cm^2 area and 1.508cm^3 volume. There is Fat Layer (Subcutaneous Tissue) exposed. There is no tunneling or undermining noted. There is a medium amount of serosanguineous drainage noted. There is medium (34-66%) red granulation within the wound bed. There is a medium (34-66%) amount of necrotic tissue within the wound bed including Eschar and Adherent Slough. The periwound skin appearance had no abnormalities noted for moisture. The periwound skin appearance exhibited: Hemosiderin Staining. Periwound temperature was noted  as No Abnormality. Assessment Active Problems ICD-10 Non-pressure chronic ulcer of other part of right lower leg with other specified severity Lymphedema, not elsewhere classified Procedures Wound #1 Pre-procedure diagnosis of Wound #1 is a Venous Leg Ulcer located on the Right,Anterior Lower Leg . There was a Four Layer Compression Therapy Procedure by Redmond Pulling, RN. Post procedure Diagnosis Wound #1: Same as Pre-Procedure Plan Follow-up Appointments: Return Appointment in 1 week. - Dr Leanord Hawking - Monday 03/18/23 @ 1000 Nurse Visit: - Friday 10:15 03/15/23 Other: - will order juxtalites from [facility] Anesthetic: Wound #1 Right,Anterior Lower Leg: (In clinic) Topical Lidocaine 5% applied to wound bed Bathing/ Shower/ Hygiene: May shower with protection but do not get wound dressing(s) wet. Protect dressing(s) with water repellant cover (for example, large plastic bag) or a cast cover and may then take shower. Edema Control - Lymphedema / SCD / Other: Elevate legs to the level of the heart or above for 30 minutes daily and/or when  sitting for 3-4 times a day throughout the day. Avoid standing for long periods of time. Exercise regularly Moisturize legs daily. The following medication(s) was prescribed: lidocaine topical 5 % ointment ointment topical once daily was prescribed at facility WOUND #1: - Lower Leg Wound Laterality: Right, Anterior Cleanser: Soap and Water 1 x Per Week/30 Days Discharge Instructions: May shower and wash wound with dial antibacterial soap and water prior to dressing change. Cleanser: Vashe 5.8 (oz) 1 x Per Week/30 Days Discharge Instructions: Cleanse the wound with Vashe prior to applying a clean dressing using gauze sponges, not tissue or cotton balls. Peri-Wound Care: Triamcinolone 15 (g) 1 x Per Week/30 Days Discharge Instructions: Use triamcinolone 15 (g) as directed Peri-Wound Care: Sween Lotion (Moisturizing lotion) 1 x Per Week/30 Days Discharge Instructions: Apply moisturizing lotion as directed [name], [name] ([id number]) [id number] Page 6 of 8 Prim Dressing: Hydrofera Blue Ready Transfer Foam, 2.5x2.5 (in/in) 1 x Per Week/30 Days ary Discharge Instructions: Apply directly to wound bed as directed Secondary Dressing: ABD Pad, 8x10 1 x Per Week/30 Days Discharge Instructions: Apply over primary dressing as directed. Com pression Wrap: Urgo K2, (equivalent to a 4 layer) two layer compression system, regular 1 x Per Week/30 Days Discharge Instructions: Apply Urgo K2 as directed (alternative to 4 layer compression). 1. We used Hydrofera Blue ABDs and an Urgo K2 lite compression. 2. Her ABI in this clinic was 0.7 however peripheral pulses in her foot are palpable and the foot feels warm to me. I see that she had arterial studies in 2022 that suggested the beginnings of tibial disease. I am not planning to wrap her leg more tightly than an Urgo K2 lite 3. With any LOC this wound will heal fairly quickly if the compression is adequate to control her  swelling. She is going to need compression stockings probably just juxta lite stockings or equivalent 4. The patient I think had a recent DVT although I am not sure where that was located, the patient seemed to think that that was in the left leg. I cannot see report on this Electronic Signature(s) Signed: 03/13/2023 7:29:11 PM By: Shawn Stall RN, BSN Signed: 03/15/2023 9:31:55 AM By: Baltazar Najjar MD Previous Signature: 03/12/2023 4:52:49 PM Version By: Baltazar Najjar MD Entered By: Shawn Stall on 03/13/2023 19:28:47 -------------------------------------------------------------------------------- HxROS Details Patient Name: Date of Service: Brittany Meier Koch. 03/12/2023 10:45 A M Medical Record Number: [id number] Patient Account Number: [id number] Date of Birth/Sex: Treating RN: September 10, 1940 (82 y.o. [name] Primary  Care Provider: Hillard Danker Other Clinician: Referring Provider: Treating Provider/Extender: Gwyneth Revels in Treatment: 0 Constitutional Symptoms (General Health) Complaints and Symptoms: Negative for: Fatigue; Fever; Chills; Marked Weight Change Eyes Complaints and Symptoms: Positive for: Glasses / Contacts Medical History: Positive for: Cataracts Negative for: Glaucoma Past Medical History Notes: AMD right eye Ear/Nose/Mouth/Throat Complaints and Symptoms: Negative for: Chronic sinus problems or rhinitis Respiratory Complaints and Symptoms: Negative for: Chronic or frequent coughs; Shortness of Breath Gastrointestinal Complaints and Symptoms: Negative for: Frequent diarrhea; Nausea; Vomiting Endocrine Complaints and Symptoms: Negative for: Heat/cold intolerance Genitourinary Complaints and Symptoms: [name], [name] ([id number]) [id number] Page 7 of 8 Negative for: Frequent urination Integumentary (Skin) Complaints and Symptoms: Positive for: Wounds - right  LE Musculoskeletal Complaints and Symptoms: Negative for: Muscle Pain; Muscle Weakness Medical History: Positive for: Osteoarthritis Past Medical History Notes: osteopenia Neurologic Complaints and Symptoms: Negative for: Numbness/parasthesias Psychiatric Complaints and Symptoms: Negative for: Claustrophobia Hematologic/Lymphatic Cardiovascular Medical History: Positive for: Peripheral Venous Disease Immunological Oncologic HBO Extended History Items Eyes: Cataracts Immunizations Pneumococcal Vaccine: Received Pneumococcal Vaccination: Yes Received Pneumococcal Vaccination On or After 60th Birthday: Yes Implantable Devices None Family and Social History Cancer: Yes - Mother; Thyroid Problems: Yes - Mother; Former smoker - quit 10 yrs ago; Marital Status - Married; Alcohol Use: Never; Drug Use: No History; Caffeine Use: Daily; Financial Concerns: No; Food, Clothing or Shelter Needs: No; Support System Lacking: No; Transportation Concerns: No Psychologist, prison and probation services) Signed: 03/12/2023 4:52:49 PM By: Baltazar Najjar MD Signed: 03/13/2023 2:21:02 PM By: Redmond Pulling RN, BSN Entered By: Redmond Pulling on 03/12/2023 11:55:08 -------------------------------------------------------------------------------- SuperBill Details Patient Name: Date of Service: Brittany Meier Koch. 03/12/2023 Medical Record Number: [id number] Patient Account Number: [id number] Date of Birth/Sex: Treating RN: 07-28-1940 (81 y.o. [name] Primary Care Provider: Hillard Danker Other Clinician: Referring Provider: Treating Provider/Extender: [name], [name], [name] ([id number]) [id number] Page 8 of 8 Weeks in Treatment: 0 Diagnosis Coding ICD-10 Codes Code Description L97.818 Non-pressure chronic ulcer of other part of right lower leg with other specified severity I89.0 Lymphedema, not elsewhere classified Facility Procedures : CPT4  Code: [id number] Description: (Facility Use Only) [phone] - APPLY MULTLAY COMPRS LWR RT LEG Modifier: Quantity: 1 Physician Procedures : CPT4 Code Description Modifier [id number] - WC PHYS LEVEL 4 - NEW PT ICD-10 Diagnosis Description L97.818 Non-pressure chronic ulcer of other part of right lower leg with other specified severity I89.0 Lymphedema, not elsewhere classified Quantity: 1 Electronic Signature(s) Signed: 03/12/2023 4:52:49 PM By: Baltazar Najjar MD Entered By: Baltazar Najjar on 03/12/2023 12:48:42

## 2023-03-18 ENCOUNTER — Encounter (HOSPITAL_BASED_OUTPATIENT_CLINIC_OR_DEPARTMENT_OTHER): Payer: Medicare PPO | Admitting: Internal Medicine

## 2023-03-18 DIAGNOSIS — Z7901 Long term (current) use of anticoagulants: Secondary | ICD-10-CM | POA: Diagnosis not present

## 2023-03-18 DIAGNOSIS — I872 Venous insufficiency (chronic) (peripheral): Secondary | ICD-10-CM | POA: Diagnosis not present

## 2023-03-18 DIAGNOSIS — L97818 Non-pressure chronic ulcer of other part of right lower leg with other specified severity: Secondary | ICD-10-CM | POA: Diagnosis not present

## 2023-03-18 DIAGNOSIS — I890 Lymphedema, not elsewhere classified: Secondary | ICD-10-CM | POA: Diagnosis not present

## 2023-03-18 DIAGNOSIS — L97812 Non-pressure chronic ulcer of other part of right lower leg with fat layer exposed: Secondary | ICD-10-CM | POA: Diagnosis not present

## 2023-03-19 NOTE — Progress Notes (Signed)
Brittany, Koch ([id number]) [id number] Page 1 of 5 Visit Report for 03/18/2023 HPI Details Patient Name: Date of Service: [name] LYNEA, ROLLISON 03/18/2023 10:00 A M Medical Record Number: [id number] Patient Account Number: [id number] Date of Birth/Sex: Treating RN: 1940-06-29 (82 y.o. F) Primary Care Provider: Hillard Danker Other Clinician: Referring Provider: Treating Provider/Extender: Farrell Ours Weeks in Treatment: 0 History of Present Illness HPI Description: ADMISSION 03/08/2023 This is an 82 year old woman who arrived accompanied by her sister and her brother. She is here for review of a wound on the right anterior lower leg which apparently has been there for about a month. She was seen in the ER on 02/25/2023 and treated with Keflex for cellulitis. She arrives in clinic today complaining of pain and swelling in the nonhealing wound on the right anterior mid tibial area In discussion with the patient it is apparent that she has had edema of her right greater than left leg for prolonged period of time measuring years. I see that she had a DVT rule out on 08/17/2022 that was negative in the right leg. Apparently after seeing Dr. Nehemiah Settle in late August she had another DVT rule out that was positive and she is on Eliquis. Past medical history includes falls, Eliquis for DVT although I cannot find the exact location, osteoporosis, osteoarthritis, cataracts ABI in our clinic was 0.7 on the right 9/30; this is a patient to has a venous insufficiency wound on the right anterior lower leg. Whether there was a component of cellulitis on the leg at the time this formed I am uncertain nevertheless the wound looks better today under compression. We treated her with Hydrofera Blue ABDs and Urgo K2 lite compression. Her ABI in our clinic was only 0.7 on the right I am not planning to wrap this more vigorously Electronic Signature(s) Signed: 03/19/2023 7:25:57  AM By: Baltazar Najjar MD Entered By: Baltazar Najjar on 03/18/2023 08:36:44 -------------------------------------------------------------------------------- Physical Exam Details Patient Name: Date of Service: Brittany Meier D. 03/18/2023 10:00 A M Medical Record Number: [id number] Patient Account Number: [id number] Date of Birth/Sex: Treating RN: 1941/03/26 (82 y.o. F) Primary Care Provider: Hillard Danker Other Clinician: Referring Provider: Treating Provider/Extender: Farrell Ours Weeks in Treatment: 0 Constitutional Sitting or standing Blood Pressure is within target range for patient.. Pulse regular and within target range for patient.Marland Kitchen Respirations regular, non-labored and within target range.. Temperature is normal and within the target range for the patient.Marland Kitchen Appears in no distress. Notes 9/30; wound exam of the right anterior mid tibia is epithelializing certainly smaller. We have better edema control in the right leg. She has chronic venous insufficiency some degree of stasis dermatitis. The wound certainly did not require debridement Electronic Signature(s) Signed: 03/19/2023 7:25:57 AM By: Baltazar Najjar MD Entered By: Baltazar Najjar on 03/18/2023 08:38:50 [name] ([id number]) [id number] Page 2 of 5 -------------------------------------------------------------------------------- Physician Orders Details Patient Name: Date of Service: Brittany, Koch 03/18/2023 10:00 A M Medical Record Number: [id number] Patient Account Number: [id number] Date of Birth/Sex: Treating RN: 1940/12/25 (82 y.o. Gevena Mart Primary Care Provider: Hillard Danker Other Clinician: Referring Provider: Treating Provider/Extender: Farrell Ours Weeks in Treatment: 0 The following information was scribed by: Brenton Grills The information was scribed for: Baltazar Najjar Verbal / Phone Orders: No Diagnosis  Coding Follow-up Appointments ppointment in 1 week. - Dr Leanord Hawking - Monday - please make appt Return A Other: - will order juxtalites from [facility] Anesthetic Wound #1  Right,Anterior Lower Leg (In clinic) Topical Lidocaine 5% applied to wound bed Bathing/ Shower/ Hygiene May shower with protection but do not get wound dressing(s) wet. Protect dressing(s) with water repellant cover (for example, large plastic bag) or a cast cover and may then take shower. Edema Control - Lymphedema / SCD / Other Bilateral Lower Extremities Elevate legs to the level of the heart or above for 30 minutes daily and/or when sitting for 3-4 times a day throughout the day. Avoid standing for long periods of time. Exercise regularly Moisturize legs daily. Wound Treatment Wound #1 - Lower Leg Wound Laterality: Right, Anterior Cleanser: Soap and Water 1 x Per Week/30 Days Discharge Instructions: May shower and wash wound with dial antibacterial soap and water prior to dressing change. Cleanser: Vashe 5.8 (oz) 1 x Per Week/30 Days Discharge Instructions: Cleanse the wound with Vashe prior to applying a clean dressing using gauze sponges, not tissue or cotton balls. Peri-Wound Care: Triamcinolone 15 (g) 1 x Per Week/30 Days Discharge Instructions: Use triamcinolone 15 (g) as directed Peri-Wound Care: Sween Lotion (Moisturizing lotion) 1 x Per Week/30 Days Discharge Instructions: Apply moisturizing lotion as directed Prim Dressing: Hydrofera Blue Ready Transfer Foam, 2.5x2.5 (in/in) 1 x Per Week/30 Days ary Discharge Instructions: Apply directly to wound bed as directed Secondary Dressing: ABD Pad, 8x10 1 x Per Week/30 Days Discharge Instructions: Apply over primary dressing as directed. Compression Wrap: Urgo K2, (equivalent to a 4 layer) two layer compression system, regular 1 x Per Week/30 Days Discharge Instructions: Apply Urgo K2 as directed (alternative to 4 layer compression). Electronic Signature(s) Signed:  03/18/2023 2:03:22 PM By: Brenton Grills Signed: 03/19/2023 7:25:57 AM By: Baltazar Najjar MD Entered By: Brenton Grills on 03/18/2023 08:04:20 Orson Aloe D ([id number]) [id number] Page 3 of 5 -------------------------------------------------------------------------------- Problem List Details Patient Name: Date of Service: [facility] AMARA, JUSTEN 03/18/2023 10:00 A M Medical Record Number: [id number] Patient Account Number: [id number] Date of Birth/Sex: Treating RN: [birth year] (82 y.o. Gevena Mart Primary Care Provider: Hillard Danker Other Clinician: Referring Provider: Treating Provider/Extender: Farrell Ours Weeks in Treatment: 0 Active Problems ICD-10 Encounter Code Description Active Date MDM Diagnosis L97.818 Non-pressure chronic ulcer of other part of right lower leg with other specified 03/12/2023 No Yes severity I89.0 Lymphedema, not elsewhere classified 03/12/2023 No Yes Inactive Problems Resolved Problems Electronic Signature(s) Signed: 03/19/2023 7:25:57 AM By: Baltazar Najjar MD Entered By: Baltazar Najjar on 03/18/2023 08:35:26 -------------------------------------------------------------------------------- Progress Note Details Patient Name: Date of Service: Brittany Meier D. 03/18/2023 10:00 A M Medical Record Number: [id number] Patient Account Number: [id number] Date of Birth/Sex: Treating RN: [birth year] (82 y.o. F) Primary Care Provider: Hillard Danker Other Clinician: Referring Provider: Treating Provider/Extender: Farrell Ours Weeks in Treatment: 0 Subjective History of Present Illness (HPI) ADMISSION 03/08/2023 This is an 82 year old woman who arrived accompanied by her sister and her brother. She is here for review of a wound on the right anterior lower leg which apparently has been there for about a month. She was seen in the ER on 02/25/2023 and treated with Keflex for cellulitis. She arrives  in clinic today complaining of pain and swelling in the nonhealing wound on the right anterior mid tibial area In discussion with the patient it is apparent that she has had edema of her right greater than left leg for prolonged period of time measuring years. I see that she had a DVT rule out on 08/17/2022 that was negative in the right leg. Apparently after seeing  Dr. Nehemiah Settle in late August she had another DVT rule out that was positive and she is on Eliquis. Past medical history includes falls, Eliquis for DVT although I cannot find the exact location, osteoporosis, osteoarthritis, cataracts ABI in our clinic was 0.7 on the right 9/30; this is a patient to has a venous insufficiency wound on the right anterior lower leg. Whether there was a component of cellulitis on the leg at the time this formed I am uncertain nevertheless the wound looks better today under compression. We treated her with Hydrofera Blue ABDs and Urgo K2 lite compression. Her ABI in our clinic was only 0.7 on the right I am not planning to wrap this more vigorously KAELYNNE, CHRISTLEY D ([id number]) [id number] Page 4 of 5 Objective Constitutional Sitting or standing Blood Pressure is within target range for patient.. Pulse regular and within target range for patient.Marland Kitchen Respirations regular, non-labored and within target range.. Temperature is normal and within the target range for the patient.Marland Kitchen Appears in no distress. Vitals Time Taken: 10:25 AM, Temperature: 98.1 F, Pulse: 64 bpm, Respiratory Rate: 18 breaths/min, Blood Pressure: 161/78 mmHg. General Notes: 9/30; wound exam of the right anterior mid tibia is epithelializing certainly smaller. We have better edema control in the right leg. She has chronic venous insufficiency some degree of stasis dermatitis. The wound certainly did not require debridement Integumentary (Hair, Skin) Wound #1 status is Open. Original cause of wound was Gradually  Appeared. The date acquired was: 12/25/2022. The wound is located on the Right,Anterior Lower Leg. The wound measures 1.5cm length x 2.5cm width x 0.1cm depth; 2.945cm^2 area and 0.295cm^3 volume. There is Fat Layer (Subcutaneous Tissue) exposed. There is no tunneling or undermining noted. There is a medium amount of serosanguineous drainage noted. There is large (67-100%) red granulation within the wound bed. There is a small (1-33%) amount of necrotic tissue within the wound bed including Adherent Slough. The periwound skin appearance had no abnormalities noted for moisture. The periwound skin appearance exhibited: Hemosiderin Staining. Periwound temperature was noted as No Abnormality. Assessment Active Problems ICD-10 Non-pressure chronic ulcer of other part of right lower leg with other specified severity Lymphedema, not elsewhere classified Procedures Wound #1 Pre-procedure diagnosis of Wound #1 is a Venous Leg Ulcer located on the Right,Anterior Lower Leg . There was a Four Layer Compression Therapy Procedure by Brenton Grills, RN. Post procedure Diagnosis Wound #1: Same as Pre-Procedure Plan Follow-up Appointments: Return Appointment in 1 week. - Dr Leanord Hawking - Monday - please make appt Other: - will order juxtalites from [facility] Anesthetic: Wound #1 Right,Anterior Lower Leg: (In clinic) Topical Lidocaine 5% applied to wound bed Bathing/ Shower/ Hygiene: May shower with protection but do not get wound dressing(s) wet. Protect dressing(s) with water repellant cover (for example, large plastic bag) or a cast cover and may then take shower. Edema Control - Lymphedema / SCD / Other: Elevate legs to the level of the heart or above for 30 minutes daily and/or when sitting for 3-4 times a day throughout the day. Avoid standing for long periods of time. Exercise regularly Moisturize legs daily. WOUND #1: - Lower Leg Wound Laterality: Right, Anterior Cleanser: Soap and Water 1 x Per Week/30  Days Discharge Instructions: May shower and wash wound with dial antibacterial soap and water prior to dressing change. Cleanser: Vashe 5.8 (oz) 1 x Per Week/30 Days Discharge Instructions: Cleanse the wound with Vashe prior to applying a clean dressing using gauze sponges, not tissue or cotton balls. Peri-Wound Care:  Triamcinolone 15 (g) 1 x Per Week/30 Days Discharge Instructions: Use triamcinolone 15 (g) as directed Peri-Wound Care: Sween Lotion (Moisturizing lotion) 1 x Per Week/30 Days Discharge Instructions: Apply moisturizing lotion as directed Prim Dressing: Hydrofera Blue Ready Transfer Foam, 2.5x2.5 (in/in) 1 x Per Week/30 Days ary Discharge Instructions: Apply directly to wound bed as directed Secondary Dressing: ABD Pad, 8x10 1 x Per Week/30 Days Discharge Instructions: Apply over primary dressing as directed. Com pression Wrap: Urgo K2, (equivalent to a 4 layer) two layer compression system, regular 1 x Per Week/30 Days Discharge Instructions: Apply Urgo K2 as directed (alternative to 4 layer compression). LAKASHA, MCFALL ([id number]) [id number] Page 5 of 5 1. Absolutely no one need to change the primary dressing which is Hydrofera Blue 2. She only had an ABI of 0.7 in our clinic I am not planning to increase her compression therefore continue with Urgo K2 lite 3. We have ordered her bilateral juxta lites which I am planning to use at 20/30 mmHg Electronic Signature(s) Signed: 03/19/2023 7:25:57 AM By: Baltazar Najjar MD Entered By: Baltazar Najjar on 03/18/2023 08:40:13 -------------------------------------------------------------------------------- SuperBill Details Patient Name: Date of Service: Brittany Meier D. 03/18/2023 Medical Record Number: [id number] Patient Account Number: [id number] Date of Birth/Sex: Treating RN: 07-18-1940 (82 y.o. Gevena Mart Primary Care Provider: Hillard Danker Other Clinician: Referring  Provider: Treating Provider/Extender: Farrell Ours Weeks in Treatment: 0 Diagnosis Coding ICD-10 Codes Code Description [phone] Non-pressure chronic ulcer of other part of right lower leg with other specified severity I89.0 Lymphedema, not elsewhere classified Facility Procedures : CPT4 Code: [id number] Description: (Facility Use Only) [phone] - APPLY MULTLAY COMPRS LWR RT LEG ICD-10 Diagnosis Description L97.818 Non-pressure chronic ulcer of other part of right lower leg with other specified sever Modifier: ity Quantity: 1 Physician Procedures : CPT4 Code Description Modifier [id number] - WC PHYS LEVEL 3 - EST PT ICD-10 Diagnosis Description L97.818 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

## 2023-03-19 NOTE — Progress Notes (Signed)
[name], [name] ([id number]) [id number] Page 1 of 8 Visit Report for 03/18/2023 Arrival Information Details Patient Name: Date of Service: [name], [name] 03/18/2023 10:00 A M Medical Record Number: [id number] Patient Account Number: [id number] Date of Birth/Sex: Treating RN: [birth year] (82 y.o. F) Primary Care [name] Other Clinician: Referring [name]: Treating [name]/Extender: [name] Weeks in Treatment: 0 Visit Information History Since Last Visit Added or deleted any medications: No Patient Arrived: [name] Any new allergies or adverse reactions: No Arrival Time: 10:18 Had a fall or experienced change in No Accompanied By: friend activities of daily living that may affect Transfer Assistance: None risk of falls: Patient Identification Verified: Yes Signs or symptoms of abuse/neglect since last visito No Secondary Verification Process Completed: Yes Hospitalized since last visit: No Patient Has Alerts: Yes Implantable device outside of the clinic excluding No Patient Alerts: Patient on Blood Thinner cellular tissue based products placed in the center eliquis since last visit: Has Dressing in Place as Prescribed: Yes Has Compression in Place as Prescribed: Yes Pain Present Now: Yes Electronic Signature(s) Signed: 03/19/2023 12:45:23 PM By: [name] Entered By: [name] on 03/18/2023 10:22:19 -------------------------------------------------------------------------------- Compression Therapy Details Patient Name: Date of Service: Brittany Meier Koch. 03/18/2023 10:00 A M Medical Record Number: [id number] Patient Account Number: [id number] Date of Birth/Sex: Treating RN: [birth year] (82 y.o. [name] Primary Care [name]: [name] Other Clinician: Referring [name]: Treating [name]/Extender: [name] Weeks in Treatment: 0 Compression Therapy Performed for  Wound Assessment: Wound #1 Right,Anterior Lower Leg Performed By: Clinician Brittany Grills, RN Compression Type: Four Layer Post Procedure Diagnosis Same as Pre-procedure Electronic Signature(s) Signed: 03/18/2023 2:03:22 PM By: [name] Entered By: [name] on 03/18/2023 11:05:49 [name] ([id number]) [id number] Page 2 of 8 -------------------------------------------------------------------------------- Encounter Discharge Information Details Patient Name: Date of Service: [facility] [name], [name] 03/18/2023 10:00 A M Medical Record Number: [id number] Patient Account Number: [id number] Date of Birth/Sex: Treating RN: 12/28/1940 (82 y.o. [name] Primary Care [name]: [name] Other Clinician: Referring [name]: Treating [name]/Extender: [name] Weeks in Treatment: 0 Encounter Discharge Information Items Discharge Condition: Stable Ambulatory Status: Walker Discharge Destination: Home Transportation: Private Auto Accompanied By: caregiver Schedule Follow-up Appointment: Yes Clinical Summary of Care: Patient Declined Electronic Signature(s) Signed: 03/18/2023 2:03:22 PM By: [name] Entered By: [name] on 03/18/2023 11:26:28 -------------------------------------------------------------------------------- Lower Extremity Assessment Details Patient Name: Date of Service: Brittany Meier Koch. 03/18/2023 10:00 A M Medical Record Number: [id number] Patient Account Number: [id number] Date of Birth/Sex: Treating RN: [birth year] (82 y.o. F) Primary Care [name]: [name] Other Clinician: Referring [name]: Treating [name]/Extender: [name] Weeks in Treatment: 0 Edema Assessment Assessed: [Left: No] [Right: No] Edema: [Left: Ye] [Right: s] Calf Left: Right: Point of Measurement: 33 cm From Medial Instep 44.4 cm Ankle Left: Right: Point of Measurement: 12 cm  From Medial Instep 25.5 cm Electronic Signature(s) Signed: 03/19/2023 12:45:23 PM By: [name] Entered By: [name] on 03/18/2023 10:34:58 -------------------------------------------------------------------------------- Multi Wound Chart Details Patient Name: Date of Service: Brittany Meier Koch. 03/18/2023 10:00 A M Medical Record Number: [id number] Patient Account Number: [id number] Date of Birth/Sex: Treating RN: [birth year] (82 y.o. F) Primary Care [name]: [name] Other Clinician: Referring [name]: Treating Malaquias Lenker/Extender: [name] in Treatment: 0 [name], [name] Koch ([id number]) [id number] Page 3 of 8 Vital Signs Height(in): Pulse(bpm): 64 Weight(lbs): Blood Pressure(mmHg): 161/78 Body Mass Index(BMI): Temperature(F):  98.1 Respiratory Rate(breaths/min): 18 [1:Photos:] [N/A:N/A] Right, Anterior Lower Leg N/A N/A Wound Location: Gradually Appeared N/A N/A Wounding Event: Venous Leg Ulcer N/A N/A Primary Etiology: Lymphedema N/A N/A Secondary Etiology: Cataracts, Peripheral Venous Disease, N/A N/A Comorbid History: Osteoarthritis 12/25/2022 N/A N/A Date Acquired: 0 N/A N/A Weeks of Treatment: Open N/A N/A Wound Status: No N/A N/A Wound Recurrence: 1.5x2.5x0.1 N/A N/A Measurements L x W x Koch (cm) 2.945 N/A N/A A (cm) : rea 0.295 N/A N/A Volume (cm) : 80.50% N/A N/A % Reduction in Area: 80.40% N/A N/A % Reduction in Volume: Full Thickness Without Exposed N/A N/A Classification: Support Structures Medium N/A N/A Exudate Amount: Serosanguineous N/A N/A Exudate Type: red, brown N/A N/A Exudate Color: Large (67-100%) N/A N/A Granulation Amount: Red N/A N/A Granulation Quality: Small (1-33%) N/A N/A Necrotic Amount: Fat Layer (Subcutaneous Tissue): Yes N/A N/A Exposed Structures: Fascia: No Tendon: No Muscle: No Joint: No Bone: No None N/A N/A Epithelialization: No  Abnormalities Noted N/A N/A Periwound Skin Moisture: Hemosiderin Staining: Yes N/A N/A Periwound Skin Color: No Abnormality N/A N/A Temperature: Compression Therapy N/A N/A Procedures Performed: Treatment Notes Wound #1 (Lower Leg) Wound Laterality: Right, Anterior Cleanser Soap and Water Discharge Instruction: May shower and wash wound with dial antibacterial soap and water prior to dressing change. Vashe 5.8 (oz) Discharge Instruction: Cleanse the wound with Vashe prior to applying a clean dressing using gauze sponges, not tissue or cotton balls. Peri-Wound Care Triamcinolone 15 (g) Discharge Instruction: Use triamcinolone 15 (g) as directed Sween Lotion (Moisturizing lotion) Discharge Instruction: Apply moisturizing lotion as directed Topical Primary Dressing Hydrofera Blue Ready Transfer Foam, 2.5x2.5 (in/in) Discharge Instruction: Apply directly to wound bed as directed Secondary Dressing ABD Pad, 8x10 [name], [name] ([id number]) [phone].pdf Page 4 of 8 Discharge Instruction: Apply over primary dressing as directed. Secured With Compression Wrap Urgo K2, (equivalent to a 4 layer) two layer compression system, regular Discharge Instruction: Apply Urgo K2 as directed (alternative to 4 layer compression). Compression Stockings Add-Ons Electronic Signature(s) Signed: 03/19/2023 7:25:57 AM By: Baltazar Najjar MD Entered By: Baltazar Najjar on 03/18/2023 11:35:33 -------------------------------------------------------------------------------- Multi-Disciplinary Care Plan Details Patient Name: Date of Service: Brittany Meier Koch. 03/18/2023 10:00 A M Medical Record Number: [id number] Patient Account Number: [id number] Date of Birth/Sex: Treating RN: 02-05-1941 (82 y.o. [name] Primary Care Laurens Matheny: [name] Other Clinician: Referring Lianette Broussard: Treating Armand Preast/Extender: [name] Weeks in Treatment: 0 Active  Inactive Orientation to the Wound Care Program Nursing Diagnoses: Knowledge deficit related to the wound healing center program Goals: Patient/caregiver will verbalize understanding of the Wound Healing Center Program Date Initiated: 03/12/2023 Target Resolution Date: 03/19/2023 Goal Status: Active Interventions: Provide education on orientation to the wound center Notes: Venous Leg Ulcer Nursing Diagnoses: Actual venous Insuffiency (use after diagnosis is confirmed) Potential for venous Insuffiency (use before diagnosis confirmed) Goals: Patient will maintain optimal edema control Date Initiated: 03/12/2023 Target Resolution Date: 04/18/2023 Goal Status: Active Interventions: Assess peripheral edema status every visit. Compression as ordered Provide education on venous insufficiency Notes: Wound/Skin Impairment Nursing Diagnoses: Impaired tissue integrity Knowledge deficit related to ulceration/compromised skin integrity [name], [name] ([id number]) [id number] Page 5 of 8 Goals: Patient/caregiver will verbalize understanding of skin care regimen Date Initiated: 03/12/2023 Target Resolution Date: 04/25/2023 Goal Status: Active Ulcer/skin breakdown will have a volume reduction of 30% by week 4 Date Initiated: 03/12/2023 Target Resolution Date: 04/09/2023 Goal Status: Active Interventions: Assess patient/caregiver ability to obtain necessary supplies Assess patient/caregiver ability to perform ulcer/skin  care regimen upon admission and as needed Assess ulceration(s) every visit Provide education on ulcer and skin care Notes: Electronic Signature(s) Signed: 03/18/2023 2:03:22 PM By: [name] Entered By: [name] on 03/18/2023 11:04:28 -------------------------------------------------------------------------------- Pain Assessment Details Patient Name: Date of Service: Brittany Meier Koch. 03/18/2023 10:00 A M Medical Record Number:  [id number] Patient Account Number: [id number] Date of Birth/Sex: Treating RN: 1940-12-26 (82 y.o. F) Primary Care August Longest: [name] Other Clinician: Referring Alizon Schmeling: Treating Bobbe Quilter/Extender: [name] Weeks in Treatment: 0 Active Problems Location of Pain Severity and Description of Pain Patient Has Paino Yes Site Locations Pain Location: Pain in Ulcers Rate the pain. Current Pain Level: 6 Pain Management and Medication Current Pain Management: Electronic Signature(s) Signed: 03/19/2023 12:45:23 PM By: [name] Entered By: [name] on 03/18/2023 10:22:54 [name] ([id number]) [phone].pdf Page 6 of 8 -------------------------------------------------------------------------------- Patient/Caregiver Education Details Patient Name: Date of Service: [name], [name] 9/30/2024andnbsp10:00 A M Medical Record Number: [id number] Patient Account Number: [id number] Date of Birth/Gender: Treating RN: May 18, 1941 (82 y.o. [name] Primary Care Physician: [name] Other Clinician: Referring Physician: Treating Physician/Extender: [name] in Treatment: 0 Education Assessment Education Provided To: Patient and Caregiver Education Topics Provided Wound/Skin Impairment: Methods: Explain/Verbal Responses: State content correctly Electronic Signature(s) Signed: 03/18/2023 2:03:22 PM By: [name] Entered By: [name] on 03/18/2023 11:04:46 -------------------------------------------------------------------------------- Wound Assessment Details Patient Name: Date of Service: Brittany Meier Koch. 03/18/2023 10:00 A M Medical Record Number: [id number] Patient Account Number: [id number] Date of Birth/Sex: Treating RN: 10-Dec-1940 (82 y.o. F) Primary Care Raynell Upton: [name] Other Clinician: Referring Tatayana Beshears: Treating Yoon Barca/Extender: [name] Weeks in Treatment: 0 Wound Status Wound Number: 1 Primary Etiology: Venous Leg Ulcer Wound Location: Right, Anterior Lower Leg Secondary Etiology: Lymphedema Wounding Event: Gradually Appeared Wound Status: Open Date Acquired: 12/25/2022 Comorbid History: Cataracts, Peripheral Venous Disease, Osteoarthritis Weeks Of Treatment: 0 Clustered Wound: No Photos Wound Measurements Length: (cm) 1.5 Width: (cm) 2.5 [name], [name] ([id number]) Depth: (cm) 0.1 Area: (cm) 2.945 Volume: (cm) 0.295 % Reduction in Area: 80.5% % Reduction in Volume: 80.4% [phone].pdf Page 7 of 8 Epithelialization: None Tunneling: No Undermining: No Wound Description Classification: Full Thickness Without Exposed Support Structures Exudate Amount: Medium Exudate Type: Serosanguineous Exudate Color: red, brown Foul Odor After Cleansing: No Slough/Fibrino Yes Wound Bed Granulation Amount: Large (67-100%) Exposed Structure Granulation Quality: Red Fascia Exposed: No Necrotic Amount: Small (1-33%) Fat Layer (Subcutaneous Tissue) Exposed: Yes Necrotic Quality: Adherent Slough Tendon Exposed: No Muscle Exposed: No Joint Exposed: No Bone Exposed: No Periwound Skin Texture Texture Color No Abnormalities Noted: No No Abnormalities Noted: No Hemosiderin Staining: Yes Moisture No Abnormalities Noted: Yes Temperature / Pain Temperature: No Abnormality Treatment Notes Wound #1 (Lower Leg) Wound Laterality: Right, Anterior Cleanser Soap and Water Discharge Instruction: May shower and wash wound with dial antibacterial soap and water prior to dressing change. Vashe 5.8 (oz) Discharge Instruction: Cleanse the wound with Vashe prior to applying a clean dressing using gauze sponges, not tissue or cotton balls. Peri-Wound Care Triamcinolone 15 (g) Discharge Instruction: Use triamcinolone 15 (g) as directed Sween Lotion (Moisturizing lotion) Discharge Instruction: Apply  moisturizing lotion as directed Topical Primary Dressing Hydrofera Blue Ready Transfer Foam, 2.5x2.5 (in/in) Discharge Instruction: Apply directly to wound bed as directed Secondary Dressing ABD Pad, 8x10 Discharge Instruction: Apply over primary dressing as directed. Secured With Compression Wrap Urgo K2, (equivalent to a 4 layer) two layer compression  system, regular Discharge Instruction: Apply Urgo K2 as directed (alternative to 4 layer compression). Compression Stockings Add-Ons Electronic Signature(s) Signed: 03/19/2023 12:45:23 PM By: [name] Entered By: [name] on 03/18/2023 10:37:52 [name] ([id number]) [phone].pdf Page 8 of 8 -------------------------------------------------------------------------------- Vitals Details Patient Name: Date of Service: [name], [name] 03/18/2023 10:00 A M Medical Record Number: [id number] Patient Account Number: [id number] Date of Birth/Sex: Treating RN: 1940-12-15 (82 y.o. F) Primary Care Shizue Kaseman: [name] Other Clinician: Referring Cj Beecher: Treating Odie Rauen/Extender: [name] Weeks in Treatment: 0 Vital Signs Time Taken: 10:25 Temperature (F): 98.1 Pulse (bpm): 64 Respiratory Rate (breaths/min): 18 Blood Pressure (mmHg): 161/78 Reference Range: 80 - 120 mg / dl Electronic Signature(s) Signed: 03/19/2023 12:45:23 PM By: [name] Entered By: [name] on 03/18/2023 10:25:22

## 2023-03-25 ENCOUNTER — Encounter (HOSPITAL_BASED_OUTPATIENT_CLINIC_OR_DEPARTMENT_OTHER): Payer: Medicare PPO | Attending: Internal Medicine | Admitting: Internal Medicine

## 2023-03-25 DIAGNOSIS — I10 Essential (primary) hypertension: Secondary | ICD-10-CM | POA: Insufficient documentation

## 2023-03-25 DIAGNOSIS — L97818 Non-pressure chronic ulcer of other part of right lower leg with other specified severity: Secondary | ICD-10-CM | POA: Insufficient documentation

## 2023-03-25 DIAGNOSIS — I872 Venous insufficiency (chronic) (peripheral): Secondary | ICD-10-CM | POA: Diagnosis not present

## 2023-03-25 DIAGNOSIS — M1990 Unspecified osteoarthritis, unspecified site: Secondary | ICD-10-CM | POA: Insufficient documentation

## 2023-03-25 DIAGNOSIS — I890 Lymphedema, not elsewhere classified: Secondary | ICD-10-CM | POA: Insufficient documentation

## 2023-03-25 DIAGNOSIS — Z7901 Long term (current) use of anticoagulants: Secondary | ICD-10-CM | POA: Diagnosis not present

## 2023-03-25 DIAGNOSIS — L97812 Non-pressure chronic ulcer of other part of right lower leg with fat layer exposed: Secondary | ICD-10-CM | POA: Diagnosis not present

## 2023-03-26 ENCOUNTER — Ambulatory Visit (HOSPITAL_BASED_OUTPATIENT_CLINIC_OR_DEPARTMENT_OTHER): Payer: Medicare PPO | Admitting: Internal Medicine

## 2023-03-26 NOTE — Progress Notes (Signed)
JANIT, CUTTER ([id number]) [id number] Page 1 of 5 Visit Report for 03/25/2023 HPI Details Patient Name: Date of Service: [facility] [name], [name] 03/25/2023 2:15 PM Medical Record Number: [id number] Patient Account Number: [id number] Date of Birth/Sex: Treating RN: April 16, 1941 (82 y.o. F) Primary Care Provider: Hillard Danker Other Clinician: Referring Provider: Treating Provider/Extender: Farrell Ours Weeks in Treatment: 1 History of Present Illness HPI Description: ADMISSION 03/08/2023 This is an 82 year old woman who arrived accompanied by her sister and her brother. She is here for review of a wound on the right anterior lower leg which apparently has been there for about a month. She was seen in the ER on 02/25/2023 and treated with Keflex for cellulitis. She arrives in clinic today complaining of pain and swelling in the nonhealing wound on the right anterior mid tibial area In discussion with the patient it is apparent that she has had edema of her right greater than left leg for prolonged period of time measuring years. I see that she had a DVT rule out on 08/17/2022 that was negative in the right leg. Apparently after seeing Dr. Nehemiah Settle in late August she had another DVT rule out that was positive and she is on Eliquis. Past medical history includes falls, Eliquis for DVT although I cannot find the exact location, osteoporosis, osteoarthritis, cataracts ABI in our clinic was 0.7 on the right 9/30; this is a patient to has a venous insufficiency wound on the right anterior lower leg. Whether there was a component of cellulitis on the leg at the time this formed I am uncertain nevertheless the wound looks better today under compression. We treated her with Hydrofera Blue ABDs and Urgo K2 lite compression. Her ABI in our clinic was only 0.7 on the right I am not planning to wrap this more vigorously 10/7; patient using Hydrofera Blue and Urgo K2 this  week she seems to have tolerated this well but I had wanted her in a Urgo K2 lite because of her marginal ABI. In any case the wound has remarkably improved. Electronic Signature(s) Signed: 03/25/2023 5:32:55 PM By: Baltazar Najjar MD Entered By: Baltazar Najjar on 03/25/2023 12:31:45 -------------------------------------------------------------------------------- Physical Exam Details Patient Name: Date of Service: Brittany Meier D. 03/25/2023 2:15 PM Medical Record Number: [id number] Patient Account Number: [id number] Date of Birth/Sex: Treating RN: [birth year] (81 y.o. F) Primary Care Provider: Hillard Danker Other Clinician: Referring Provider: Treating Provider/Extender: Farrell Ours Weeks in Treatment: 1 Constitutional Patient is hypertensive.. Pulse regular and within target range for patient.Marland Kitchen Respirations regular, non-labored and within target range.. Temperature is normal and within the target range for the patient.Marland Kitchen Appears in no distress. Notes Wound exam on the right anterior mid tibia in the setting of lymphedema. The wound is smaller granulation looks healthy no debridement is required no evidence of infection. Electronic Signature(s) Signed: 03/25/2023 5:32:55 PM By: Baltazar Najjar MD Entered By: Baltazar Najjar on 03/25/2023 12:32:30 Brittany Aloe D ([id number]) [id number] Page 2 of 5 -------------------------------------------------------------------------------- Physician Orders Details Patient Name: Date of Service: [name], [name] 03/25/2023 2:15 PM Medical Record Number: [id number] Patient Account Number: [id number] Date of Birth/Sex: Treating RN: 1941/06/03 (82 y.o. [name] Primary Care Provider: Hillard Danker Other Clinician: Referring Provider: Treating Provider/Extender: Farrell Ours Weeks in Treatment: 1 Verbal / Phone Orders: No Diagnosis Coding Follow-up  Appointments ppointment in 1 week. - Dr Leanord Hawking - Monday 04/01/23 @ 12:30pm Return A ppointment in 2 weeks. - Dr Leanord Hawking -  Monday 04/08/23 @ 10:30am Return A Other: - will order juxtalites from [facility] Anesthetic Wound #1 Right,Anterior Lower Leg (In clinic) Topical Lidocaine 5% applied to wound bed Bathing/ Shower/ Hygiene May shower with protection but do not get wound dressing(s) wet. Protect dressing(s) with water repellant cover (for example, large plastic bag) or a cast cover and may then take shower. Edema Control - Lymphedema / SCD / Other Bilateral Lower Extremities Elevate legs to the level of the heart or above for 30 minutes daily and/or when sitting for 3-4 times a day throughout the day. Avoid standing for long periods of time. Exercise regularly Moisturize legs daily. Wound Treatment Wound #1 - Lower Leg Wound Laterality: Right, Anterior Cleanser: Soap and Water 1 x Per Week/30 Days Discharge Instructions: May shower and wash wound with dial antibacterial soap and water prior to dressing change. Cleanser: Vashe 5.8 (oz) 1 x Per Week/30 Days Discharge Instructions: Cleanse the wound with Vashe prior to applying a clean dressing using gauze sponges, not tissue or cotton balls. Peri-Wound Care: Triamcinolone 15 (g) 1 x Per Week/30 Days Discharge Instructions: Use triamcinolone 15 (g) as directed Peri-Wound Care: Sween Lotion (Moisturizing lotion) 1 x Per Week/30 Days Discharge Instructions: Apply moisturizing lotion as directed Prim Dressing: Hydrofera Blue Ready Transfer Foam, 2.5x2.5 (in/in) 1 x Per Week/30 Days ary Discharge Instructions: Apply directly to wound bed as directed Secondary Dressing: ABD Pad, 8x10 1 x Per Week/30 Days Discharge Instructions: Apply over primary dressing as directed. Compression Wrap: Urgo K2 Lite, (equivalent to a 3 layer) two layer compression system, regular 1 x Per Week/30 Days Discharge Instructions: Apply Urgo K2 Lite as directed  (alternative to 3 layer compression). Patient Medications llergies: No Known Allergies A Notifications Medication Indication Start End 03/25/2023 lidocaine DOSE topical 5 % ointment - ointment topical once daily Electronic Signature(s) Signed: 03/25/2023 5:16:54 PM By: Redmond Pulling RN, BSN [place], Carver Fila D ([id number]) [id number] Page 3 of 5 Signed: 03/25/2023 5:32:55 PM By: Baltazar Najjar MD Entered By: Redmond Pulling on 03/25/2023 12:30:04 -------------------------------------------------------------------------------- Problem List Details Patient Name: Date of Service: Brittany Meier D. 03/25/2023 2:15 PM Medical Record Number: [id number] Patient Account Number: [id number] Date of Birth/Sex: Treating RN: April 29, 1941 (81 y.o. F) Primary Care Provider: Hillard Danker Other Clinician: Referring Provider: Treating Provider/Extender: Farrell Ours Weeks in Treatment: 1 Active Problems ICD-10 Encounter Code Description Active Date MDM Diagnosis L97.818 Non-pressure chronic ulcer of other part of right lower leg with other specified 03/12/2023 No Yes severity I89.0 Lymphedema, not elsewhere classified 03/12/2023 No Yes Inactive Problems Resolved Problems Electronic Signature(s) Signed: 03/25/2023 5:32:55 PM By: Baltazar Najjar MD Entered By: Baltazar Najjar on 03/25/2023 12:30:27 -------------------------------------------------------------------------------- Progress Note Details Patient Name: Date of Service: Brittany Meier D. 03/25/2023 2:15 PM Medical Record Number: [id number] Patient Account Number: [id number] Date of Birth/Sex: Treating RN: May 11, 1941 (81 y.o. F) Primary Care Provider: Hillard Danker Other Clinician: Referring Provider: Treating Provider/Extender: Farrell Ours Weeks in Treatment: 1 Subjective History of Present Illness (HPI) ADMISSION 03/08/2023 This is an 82 year old woman who arrived  accompanied by her sister and her brother. She is here for review of a wound on the right anterior lower leg which apparently has been there for about a month. She was seen in the ER on 02/25/2023 and treated with Keflex for cellulitis. She arrives in clinic today complaining of pain and swelling in the nonhealing wound on the right anterior mid tibial area In discussion with the patient it is apparent  that she has had edema of her right greater than left leg for prolonged period of time measuring years. I see that she had a DVT rule out on 08/17/2022 that was negative in the right leg. Apparently after seeing Dr. Nehemiah Settle in late August she had another DVT rule out that was positive and she is on Eliquis. Past medical history includes falls, Eliquis for DVT although I cannot find the exact location, osteoporosis, osteoarthritis, cataracts [name], [name] ([id number]) [id number] Page 4 of 5 ABI in our clinic was 0.7 on the right 9/30; this is a patient to has a venous insufficiency wound on the right anterior lower leg. Whether there was a component of cellulitis on the leg at the time this formed I am uncertain nevertheless the wound looks better today under compression. We treated her with Hydrofera Blue ABDs and Urgo K2 lite compression. Her ABI in our clinic was only 0.7 on the right I am not planning to wrap this more vigorously 10/7; patient using Hydrofera Blue and Urgo K2 this week she seems to have tolerated this well but I had wanted her in a Urgo K2 lite because of her marginal ABI. In any case the wound has remarkably improved. Objective Constitutional Patient is hypertensive.. Pulse regular and within target range for patient.Marland Kitchen Respirations regular, non-labored and within target range.. Temperature is normal and within the target range for the patient.Marland Kitchen Appears in no distress. Vitals Time Taken: 2:41 PM, Temperature: 97.8 F, Pulse: 67 bpm, Respiratory Rate: 18  breaths/min, Blood Pressure: 174/79 mmHg. General Notes: Wound exam on the right anterior mid tibia in the setting of lymphedema. The wound is smaller granulation looks healthy no debridement is required no evidence of infection. Integumentary (Hair, Skin) Wound #1 status is Open. Original cause of wound was Gradually Appeared. The date acquired was: 12/25/2022. The wound has been in treatment 1 weeks. The wound is located on the Right,Anterior Lower Leg. The wound measures 0.5cm length x 0.4cm width x 0.1cm depth; 0.157cm^2 area and 0.016cm^3 volume. There is Fat Layer (Subcutaneous Tissue) exposed. There is no tunneling or undermining noted. There is a medium amount of serosanguineous drainage noted. There is large (67-100%) red granulation within the wound bed. There is a small (1-33%) amount of necrotic tissue within the wound bed including Adherent Slough. The periwound skin appearance had no abnormalities noted for moisture. The periwound skin appearance exhibited: Hemosiderin Staining. Periwound temperature was noted as No Abnormality. Assessment Active Problems ICD-10 Non-pressure chronic ulcer of other part of right lower leg with other specified severity Lymphedema, not elsewhere classified Procedures Wound #1 Pre-procedure diagnosis of Wound #1 is a Venous Leg Ulcer located on the Right,Anterior Lower Leg . There was a Three Layer Compression Therapy Procedure by Redmond Pulling, RN. Post procedure Diagnosis Wound #1: Same as Pre-Procedure Plan Follow-up Appointments: Return Appointment in 1 week. - Dr Leanord Hawking - Monday 04/01/23 @ 12:30pm Return Appointment in 2 weeks. - Dr Leanord Hawking - Monday 04/08/23 @ 10:30am Other: - will order juxtalites from [facility] Anesthetic: Wound #1 Right,Anterior Lower Leg: (In clinic) Topical Lidocaine 5% applied to wound bed Bathing/ Shower/ Hygiene: May shower with protection but do not get wound dressing(s) wet. Protect dressing(s) with water repellant  cover (for example, large plastic bag) or a cast cover and may then take shower. Edema Control - Lymphedema / SCD / Other: Elevate legs to the level of the heart or above for 30 minutes daily and/or when sitting for 3-4 times a day throughout  the day. Avoid standing for long periods of time. Exercise regularly Moisturize legs daily. The following medication(s) was prescribed: lidocaine topical 5 % ointment ointment topical once daily was prescribed at facility WOUND #1: - Lower Leg Wound Laterality: Right, Anterior [name], [name] ([id number]) [id number] Page 5 of 5 Cleanser: Soap and Water 1 x Per Week/30 Days Discharge Instructions: May shower and wash wound with dial antibacterial soap and water prior to dressing change. Cleanser: Vashe 5.8 (oz) 1 x Per Week/30 Days Discharge Instructions: Cleanse the wound with Vashe prior to applying a clean dressing using gauze sponges, not tissue or cotton balls. Peri-Wound Care: Triamcinolone 15 (g) 1 x Per Week/30 Days Discharge Instructions: Use triamcinolone 15 (g) as directed Peri-Wound Care: Sween Lotion (Moisturizing lotion) 1 x Per Week/30 Days Discharge Instructions: Apply moisturizing lotion as directed Prim Dressing: Hydrofera Blue Ready Transfer Foam, 2.5x2.5 (in/in) 1 x Per Week/30 Days ary Discharge Instructions: Apply directly to wound bed as directed Secondary Dressing: ABD Pad, 8x10 1 x Per Week/30 Days Discharge Instructions: Apply over primary dressing as directed. Com pression Wrap: Urgo K2 Lite, (equivalent to a 3 layer) two layer compression system, regular 1 x Per Week/30 Days Discharge Instructions: Apply Urgo K2 Lite as directed (alternative to 3 layer compression). 1. Continuing with Hydrofera Blue ABDs under and Urgo K2 lite 2. Looks as though this could be closed within the next 2 weeks. 3. She has bilateral juxta lite stocking we will apply 1 to the left leg today. Electronic  Signature(s) Signed: 03/25/2023 5:32:55 PM By: Baltazar Najjar MD Entered By: Baltazar Najjar on 03/25/2023 12:33:34 -------------------------------------------------------------------------------- SuperBill Details Patient Name: Date of Service: Brittany Meier D. 03/25/2023 Medical Record Number: [id number] Patient Account Number: [id number] Date of Birth/Sex: Treating RN: 1940/07/17 (82 y.o. F) Primary Care Provider: Hillard Danker Other Clinician: Referring Provider: Treating Provider/Extender: Farrell Ours Weeks in Treatment: 1 Diagnosis Coding ICD-10 Codes Code Description [phone] Non-pressure chronic ulcer of other part of right lower leg with other specified severity I89.0 Lymphedema, not elsewhere classified Facility Procedures : CPT4 Code: [id number] Description: (Facility Use Only) [phone] - APPLY MULTLAY COMPRS LWR RT LEG Modifier: Quantity: 1 Physician Procedures : CPT4 Code Description Modifier [id number] - WC PHYS LEVEL 3 - EST PT ICD-10 Diagnosis Description L97.818 Non-pressure chronic ulcer of other part of right lower leg with other specified severity I89.0 Lymphedema, not elsewhere classified Quantity: 1 Electronic Signature(s) Signed: 03/25/2023 5:16:54 PM By: Redmond Pulling RN, BSN Signed: 03/25/2023 5:32:55 PM By: Baltazar Najjar MD Entered By: Redmond Pulling on 03/25/2023 12:56:49

## 2023-03-26 NOTE — Progress Notes (Signed)
[name], [name] ([id number]) [id number] Page 1 of 7 Visit Report for 03/25/2023 Arrival Information Details Patient Name: Date of Service: [facility] [name], [name] 03/25/2023 2:15 PM Medical Record Number: [id number] Patient Account Number: [id number] Date of Birth/Sex: Treating RN: 03/02/1941 (82 y.o. [name] Primary Care [name]: [name] Other Clinician: Referring [name]: Treating [name]/Extender: [name] Weeks in Treatment: 1 Visit Information History Since Last Visit Added or deleted any medications: No Patient Arrived: [name] Any new allergies or adverse reactions: No Arrival Time: 14:39 Had a fall or experienced change in No Accompanied By: sister activities of daily living that may affect Transfer Assistance: None risk of falls: Patient Identification Verified: Yes Signs or symptoms of abuse/neglect since last visito No Secondary Verification Process Completed: Yes Hospitalized since last visit: No Patient Has Alerts: Yes Implantable device outside of the clinic excluding No Patient Alerts: Patient on Blood Thinner cellular tissue based products placed in the center eliquis since last visit: Has Dressing in Place as Prescribed: Yes Has Compression in Place as Prescribed: Yes Pain Present Now: No Electronic Signature(s) Signed: 03/25/2023 5:16:54 PM By: Brittany Pulling RN, BSN Entered By: [name] on 03/25/2023 11:40:54 -------------------------------------------------------------------------------- Compression Therapy Details Patient Name: Date of Service: [name]. 03/25/2023 2:15 PM Medical Record Number: [id number] Patient Account Number: [id number] Date of Birth/Sex: Treating RN: [birth year] (82 y.o. [name] Primary Care [name]: [name] Other Clinician: Referring [name]: Treating Kishon Garriga/Extender: [name] Weeks in Treatment: 1 Compression Therapy  Performed for Wound Assessment: Wound #1 Right,Anterior Lower Leg Performed By: Clinician Brittany Pulling, RN Compression Type: Three Layer Post Procedure Diagnosis Same as Pre-procedure Electronic Signature(s) Signed: 03/25/2023 5:16:54 PM By: Brittany Pulling RN, BSN Entered By: [name] on 03/25/2023 12:30:33 [name] ([id number]) [id number] Page 2 of 7 -------------------------------------------------------------------------------- Encounter Discharge Information Details Patient Name: Date of Service: [facility] AHMAYA, [name] 03/25/2023 2:15 PM Medical Record Number: [id number] Patient Account Number: [id number] Date of Birth/Sex: Treating RN: 01-11-1941 (82 y.o. [name] Primary Care [name]: [name] Other Clinician: Referring Dorina Koch: Treating [name]/Extender: [name] in Treatment: 1 Encounter Discharge Information Items Discharge Condition: Stable Ambulatory Status: Cane Discharge Destination: Home Transportation: Private Auto Accompanied By: sister Schedule Follow-up Appointment: Yes Clinical Summary of Care: Patient Declined Electronic Signature(s) Signed: 03/25/2023 5:16:54 PM By: Brittany Pulling RN, BSN Entered By: [name] on 03/25/2023 12:57:35 -------------------------------------------------------------------------------- Lower Extremity Assessment Details Patient Name: Date of Service: [name]. 03/25/2023 2:15 PM Medical Record Number: [id number] Patient Account Number: [id number] Date of Birth/Sex: Treating RN: 1941-06-02 (82 y.o. [name] Primary Care Shantinique Picazo: [name] Other Clinician: Referring Taisia Fantini: Treating Lizbet Cirrincione/Extender: [name] Weeks in Treatment: 1 Edema Assessment Assessed: [Left: No] [Right: No] Edema: [Left: Ye] [Right: s] Calf Left: Right: Point of Measurement: 33 cm From Medial Instep 48.5 cm Ankle Left:  Right: Point of Measurement: 12 cm From Medial Instep 25 cm Vascular Assessment Pulses: Dorsalis Pedis Palpable: [Right:Yes] Electronic Signature(s) Signed: 03/25/2023 5:16:54 PM By: Brittany Pulling RN, BSN Entered By: [name] on 03/25/2023 11:51:23 Multi Wound Chart Details -------------------------------------------------------------------------------- [name] ([id number]) [id number] Page 3 of 7 Patient Name: Date of Service: [name], [name] 03/25/2023 2:15 PM Medical Record Number: [id number] Patient Account Number: [id number] Date of Birth/Sex: Treating RN: 1940/10/30 (82 y.o. F) Primary Care Cornelius Schuitema: [name] Other Clinician: Referring Janson Lamar: Treating Jibreel Fedewa/Extender: [name] Weeks in Treatment:  1 Vital Signs Height(in): Pulse(bpm): 67 Weight(lbs): Blood Pressure(mmHg): 174/79 Body Mass Index(BMI): Temperature(F): 97.8 Respiratory Rate(breaths/min): 18 Wound Assessments Wound Number: 1 N/A N/A Photos: N/A N/A Right, Anterior Lower Leg N/A N/A Wound Location: Gradually Appeared N/A N/A Wounding Event: Venous Leg Ulcer N/A N/A Primary Etiology: Lymphedema N/A N/A Secondary Etiology: Cataracts, Peripheral Venous Disease, N/A N/A Comorbid History: Osteoarthritis 12/25/2022 N/A N/A Date Acquired: 1 N/A N/A Weeks of Treatment: Open N/A N/A Wound Status: No N/A N/A Wound Recurrence: 0.5x0.4x0.1 N/A N/A Measurements L x W x Koch (cm) 0.157 N/A N/A A (cm) : rea 0.016 N/A N/A Volume (cm) : 99.00% N/A N/A % Reduction in Area: 98.90% N/A N/A % Reduction in Volume: Full Thickness Without Exposed N/A N/A Classification: Support Structures Medium N/A N/A Exudate Amount: Serosanguineous N/A N/A Exudate Type: red, brown N/A N/A Exudate Color: Large (67-100%) N/A N/A Granulation Amount: Red N/A N/A Granulation Quality: Small (1-33%) N/A N/A Necrotic Amount: Fat Layer (Subcutaneous  Tissue): Yes N/A N/A Exposed Structures: Fascia: No Tendon: No Muscle: No Joint: No Bone: No None N/A N/A Epithelialization: No Abnormalities Noted N/A N/A Periwound Skin Moisture: Hemosiderin Staining: Yes N/A N/A Periwound Skin Color: No Abnormality N/A N/A Temperature: Compression Therapy N/A N/A Procedures Performed: Treatment Notes Electronic Signature(s) Signed: 03/25/2023 5:32:55 PM By: Baltazar Najjar MD Entered By: Baltazar Najjar on 03/25/2023 12:30:36 -------------------------------------------------------------------------------- Multi-Disciplinary Care Plan Details Patient Name: Date of Service: [name], Brittany Fila Koch. 03/25/2023 2:15 PM [name] ([id number]) [id number] Page 4 of 7 Medical Record Number: [id number] Patient Account Number: [id number] Date of Birth/Sex: Treating RN: [birth year] (82 y.o. [name] Primary Care Donnelle Rubey: [name] Other Clinician: Referring Aalyssa Elderkin: Treating Tyreanna Bisesi/Extender: [name] Weeks in Treatment: 1 Active Inactive Venous Leg Ulcer Nursing Diagnoses: Actual venous Insuffiency (use after diagnosis is confirmed) Potential for venous Insuffiency (use before diagnosis confirmed) Goals: Patient will maintain optimal edema control Date Initiated: 03/12/2023 Target Resolution Date: 04/18/2023 Goal Status: Active Interventions: Assess peripheral edema status every visit. Compression as ordered Provide education on venous insufficiency Notes: Wound/Skin Impairment Nursing Diagnoses: Impaired tissue integrity Knowledge deficit related to ulceration/compromised skin integrity Goals: Patient/caregiver will verbalize understanding of skin care regimen Date Initiated: 03/12/2023 Target Resolution Date: 04/25/2023 Goal Status: Active Ulcer/skin breakdown will have a volume reduction of 30% by week 4 Date Initiated: 03/12/2023 Target Resolution Date: 04/09/2023 Goal  Status: Active Interventions: Assess patient/caregiver ability to obtain necessary supplies Assess patient/caregiver ability to perform ulcer/skin care regimen upon admission and as needed Assess ulceration(s) every visit Provide education on ulcer and skin care Notes: Electronic Signature(s) Signed: 03/25/2023 5:16:54 PM By: Brittany Pulling RN, BSN Entered By: [name] on 03/25/2023 11:52:18 -------------------------------------------------------------------------------- Pain Assessment Details Patient Name: Date of Service: [name]. 03/25/2023 2:15 PM Medical Record Number: [id number] Patient Account Number: [id number] Date of Birth/Sex: Treating RN: 12-04-1940 (82 y.o. [name] Primary Care Frutoso Dimare: [name] Other Clinician: Referring Elliet Goodnow: Treating Salvador Bigbee/Extender: [name] Weeks in Treatment: 1 Active Problems Location of Pain Severity and Description of Pain Patient Has Paino No [name], [name] ([id number]) [phone].pdf Page 5 of 7 Patient Has Paino No Site Locations Pain Management and Medication Current Pain Management: Electronic Signature(s) Signed: 03/25/2023 5:16:54 PM By: Brittany Pulling RN, BSN Entered By: [name] on 03/25/2023 11:41:56 -------------------------------------------------------------------------------- Patient/Caregiver Education Details Patient Name: Date of Service: [name] 10/7/2024andnbsp2:15 PM Medical Record Number: [id number] Patient Account Number: [id number] Date of Birth/Gender: Treating RN: 1941/02/01 (  82 y.o. [name] Primary Care Physician: [name] Other Clinician: Referring Physician: Treating Physician/Extender: [name] in Treatment: 1 Education Assessment Education Provided To: Patient Education Topics Provided Wound/Skin Impairment: Methods: Explain/Verbal Responses: State content  correctly Electronic Signature(s) Signed: 03/25/2023 5:16:54 PM By: Brittany Pulling RN, BSN Entered By: [name] on 03/25/2023 11:52:32 -------------------------------------------------------------------------------- Wound Assessment Details Patient Name: Date of Service: [name]. 03/25/2023 2:15 PM Medical Record Number: [id number] Patient Account Number: [id number] Date of Birth/Sex: Treating RN: 1940-09-23 (81 y.o. [address], [place] Koch ([id number]) [phone].pdf Page 6 of 7 Primary Care Rigo Letts: [name] Other Clinician: Referring Shallen Luedke: Treating Luann Aspinwall/Extender: [name] Weeks in Treatment: 1 Wound Status Wound Number: 1 Primary Etiology: Venous Leg Ulcer Wound Location: Right, Anterior Lower Leg Secondary Etiology: Lymphedema Wounding Event: Gradually Appeared Wound Status: Open Date Acquired: 12/25/2022 Comorbid History: Cataracts, Peripheral Venous Disease, Osteoarthritis Weeks Of Treatment: 1 Clustered Wound: No Photos Wound Measurements Length: (cm) 0.5 Width: (cm) 0.4 Depth: (cm) 0.1 Area: (cm) 0.157 Volume: (cm) 0.016 % Reduction in Area: 99% % Reduction in Volume: 98.9% Epithelialization: None Tunneling: No Undermining: No Wound Description Classification: Full Thickness Without Exposed Support Structures Exudate Amount: Medium Exudate Type: Serosanguineous Exudate Color: red, brown Foul Odor After Cleansing: No Slough/Fibrino Yes Wound Bed Granulation Amount: Large (67-100%) Exposed Structure Granulation Quality: Red Fascia Exposed: No Necrotic Amount: Small (1-33%) Fat Layer (Subcutaneous Tissue) Exposed: Yes Necrotic Quality: Adherent Slough Tendon Exposed: No Muscle Exposed: No Joint Exposed: No Bone Exposed: No Periwound Skin Texture Texture Color No Abnormalities Noted: No No Abnormalities Noted: No Hemosiderin Staining: Yes Moisture No Abnormalities  Noted: Yes Temperature / Pain Temperature: No Abnormality Treatment Notes Wound #1 (Lower Leg) Wound Laterality: Right, Anterior Cleanser Soap and Water Discharge Instruction: May shower and wash wound with dial antibacterial soap and water prior to dressing change. Vashe 5.8 (oz) Discharge Instruction: Cleanse the wound with Vashe prior to applying a clean dressing using gauze sponges, not tissue or cotton balls. Peri-Wound Care Triamcinolone 15 (g) Discharge Instruction: Use triamcinolone 15 (g) as directed Sween Lotion (Moisturizing lotion) Discharge Instruction: Apply moisturizing lotion as directed Topical [name], [name] ([id number]) [id number] Page 7 of 7 Primary Dressing Hydrofera Blue Ready Transfer Foam, 2.5x2.5 (in/in) Discharge Instruction: Apply directly to wound bed as directed Secondary Dressing ABD Pad, 8x10 Discharge Instruction: Apply over primary dressing as directed. Secured With Compression Wrap Urgo K2 Lite, (equivalent to a 3 layer) two layer compression system, regular Discharge Instruction: Apply Urgo K2 Lite as directed (alternative to 3 layer compression). Compression Stockings Add-Ons Electronic Signature(s) Signed: 03/25/2023 5:16:54 PM By: Brittany Pulling RN, BSN Entered By: [name] on 03/25/2023 11:51:14 -------------------------------------------------------------------------------- Vitals Details Patient Name: Date of Service: [name]. 03/25/2023 2:15 PM Medical Record Number: [id number] Patient Account Number: [id number] Date of Birth/Sex: Treating RN: 1941/06/07 (81 y.o. [name] Primary Care Rubel Heckard: [name] Other Clinician: Referring Aleynah Rocchio: Treating Castella Lerner/Extender: [name] Weeks in Treatment: 1 Vital Signs Time Taken: 14:41 Temperature (F): 97.8 Pulse (bpm): 67 Respiratory Rate (breaths/min): 18 Blood Pressure (mmHg): 174/79 Reference Range: 80 -  120 mg / dl Electronic Signature(s) Signed: 03/25/2023 5:16:54 PM By: Brittany Pulling RN, BSN Entered By: [name] on 03/25/2023 11:41:37

## 2023-03-29 DIAGNOSIS — M4316 Spondylolisthesis, lumbar region: Secondary | ICD-10-CM | POA: Diagnosis not present

## 2023-03-29 DIAGNOSIS — M48061 Spinal stenosis, lumbar region without neurogenic claudication: Secondary | ICD-10-CM | POA: Diagnosis not present

## 2023-03-29 DIAGNOSIS — I872 Venous insufficiency (chronic) (peripheral): Secondary | ICD-10-CM | POA: Diagnosis not present

## 2023-03-29 DIAGNOSIS — I82402 Acute embolism and thrombosis of unspecified deep veins of left lower extremity: Secondary | ICD-10-CM | POA: Diagnosis not present

## 2023-03-29 DIAGNOSIS — U071 COVID-19: Secondary | ICD-10-CM | POA: Diagnosis not present

## 2023-03-29 DIAGNOSIS — M159 Polyosteoarthritis, unspecified: Secondary | ICD-10-CM | POA: Diagnosis not present

## 2023-03-29 DIAGNOSIS — H9191 Unspecified hearing loss, right ear: Secondary | ICD-10-CM | POA: Diagnosis not present

## 2023-03-29 DIAGNOSIS — L97812 Non-pressure chronic ulcer of other part of right lower leg with fat layer exposed: Secondary | ICD-10-CM | POA: Diagnosis not present

## 2023-03-29 DIAGNOSIS — L03115 Cellulitis of right lower limb: Secondary | ICD-10-CM | POA: Diagnosis not present

## 2023-04-01 ENCOUNTER — Encounter (HOSPITAL_BASED_OUTPATIENT_CLINIC_OR_DEPARTMENT_OTHER): Payer: Medicare PPO | Admitting: Internal Medicine

## 2023-04-01 DIAGNOSIS — L97818 Non-pressure chronic ulcer of other part of right lower leg with other specified severity: Secondary | ICD-10-CM | POA: Diagnosis not present

## 2023-04-01 DIAGNOSIS — I10 Essential (primary) hypertension: Secondary | ICD-10-CM | POA: Diagnosis not present

## 2023-04-01 DIAGNOSIS — I872 Venous insufficiency (chronic) (peripheral): Secondary | ICD-10-CM | POA: Diagnosis not present

## 2023-04-01 DIAGNOSIS — M1990 Unspecified osteoarthritis, unspecified site: Secondary | ICD-10-CM | POA: Diagnosis not present

## 2023-04-01 DIAGNOSIS — I890 Lymphedema, not elsewhere classified: Secondary | ICD-10-CM | POA: Diagnosis not present

## 2023-04-01 DIAGNOSIS — Z7901 Long term (current) use of anticoagulants: Secondary | ICD-10-CM | POA: Diagnosis not present

## 2023-04-02 DIAGNOSIS — H9191 Unspecified hearing loss, right ear: Secondary | ICD-10-CM | POA: Diagnosis not present

## 2023-04-02 DIAGNOSIS — L03115 Cellulitis of right lower limb: Secondary | ICD-10-CM | POA: Diagnosis not present

## 2023-04-02 DIAGNOSIS — L97812 Non-pressure chronic ulcer of other part of right lower leg with fat layer exposed: Secondary | ICD-10-CM | POA: Diagnosis not present

## 2023-04-02 DIAGNOSIS — U071 COVID-19: Secondary | ICD-10-CM | POA: Diagnosis not present

## 2023-04-02 DIAGNOSIS — M159 Polyosteoarthritis, unspecified: Secondary | ICD-10-CM | POA: Diagnosis not present

## 2023-04-02 DIAGNOSIS — I872 Venous insufficiency (chronic) (peripheral): Secondary | ICD-10-CM | POA: Diagnosis not present

## 2023-04-02 DIAGNOSIS — M48061 Spinal stenosis, lumbar region without neurogenic claudication: Secondary | ICD-10-CM | POA: Diagnosis not present

## 2023-04-02 DIAGNOSIS — I82402 Acute embolism and thrombosis of unspecified deep veins of left lower extremity: Secondary | ICD-10-CM | POA: Diagnosis not present

## 2023-04-02 DIAGNOSIS — M4316 Spondylolisthesis, lumbar region: Secondary | ICD-10-CM | POA: Diagnosis not present

## 2023-04-10 DIAGNOSIS — I872 Venous insufficiency (chronic) (peripheral): Secondary | ICD-10-CM | POA: Diagnosis not present

## 2023-04-10 DIAGNOSIS — I82402 Acute embolism and thrombosis of unspecified deep veins of left lower extremity: Secondary | ICD-10-CM | POA: Diagnosis not present

## 2023-04-10 DIAGNOSIS — M4316 Spondylolisthesis, lumbar region: Secondary | ICD-10-CM | POA: Diagnosis not present

## 2023-04-10 DIAGNOSIS — M159 Polyosteoarthritis, unspecified: Secondary | ICD-10-CM | POA: Diagnosis not present

## 2023-04-10 DIAGNOSIS — M48061 Spinal stenosis, lumbar region without neurogenic claudication: Secondary | ICD-10-CM | POA: Diagnosis not present

## 2023-04-10 DIAGNOSIS — L03115 Cellulitis of right lower limb: Secondary | ICD-10-CM | POA: Diagnosis not present

## 2023-04-10 DIAGNOSIS — L97812 Non-pressure chronic ulcer of other part of right lower leg with fat layer exposed: Secondary | ICD-10-CM | POA: Diagnosis not present

## 2023-04-10 DIAGNOSIS — U071 COVID-19: Secondary | ICD-10-CM | POA: Diagnosis not present

## 2023-04-10 DIAGNOSIS — H9191 Unspecified hearing loss, right ear: Secondary | ICD-10-CM | POA: Diagnosis not present

## 2023-04-16 DIAGNOSIS — I82402 Acute embolism and thrombosis of unspecified deep veins of left lower extremity: Secondary | ICD-10-CM | POA: Diagnosis not present

## 2023-04-16 DIAGNOSIS — U071 COVID-19: Secondary | ICD-10-CM | POA: Diagnosis not present

## 2023-04-16 DIAGNOSIS — M4316 Spondylolisthesis, lumbar region: Secondary | ICD-10-CM | POA: Diagnosis not present

## 2023-04-16 DIAGNOSIS — L03115 Cellulitis of right lower limb: Secondary | ICD-10-CM | POA: Diagnosis not present

## 2023-04-16 DIAGNOSIS — M159 Polyosteoarthritis, unspecified: Secondary | ICD-10-CM | POA: Diagnosis not present

## 2023-04-16 DIAGNOSIS — H9191 Unspecified hearing loss, right ear: Secondary | ICD-10-CM | POA: Diagnosis not present

## 2023-04-16 DIAGNOSIS — M48061 Spinal stenosis, lumbar region without neurogenic claudication: Secondary | ICD-10-CM | POA: Diagnosis not present

## 2023-04-16 DIAGNOSIS — L97812 Non-pressure chronic ulcer of other part of right lower leg with fat layer exposed: Secondary | ICD-10-CM | POA: Diagnosis not present

## 2023-04-16 DIAGNOSIS — I872 Venous insufficiency (chronic) (peripheral): Secondary | ICD-10-CM | POA: Diagnosis not present

## 2023-04-23 DIAGNOSIS — M159 Polyosteoarthritis, unspecified: Secondary | ICD-10-CM | POA: Diagnosis not present

## 2023-04-23 DIAGNOSIS — M48061 Spinal stenosis, lumbar region without neurogenic claudication: Secondary | ICD-10-CM | POA: Diagnosis not present

## 2023-04-23 DIAGNOSIS — U071 COVID-19: Secondary | ICD-10-CM | POA: Diagnosis not present

## 2023-04-23 DIAGNOSIS — M4316 Spondylolisthesis, lumbar region: Secondary | ICD-10-CM | POA: Diagnosis not present

## 2023-04-23 DIAGNOSIS — I872 Venous insufficiency (chronic) (peripheral): Secondary | ICD-10-CM | POA: Diagnosis not present

## 2023-04-23 DIAGNOSIS — L03115 Cellulitis of right lower limb: Secondary | ICD-10-CM | POA: Diagnosis not present

## 2023-04-23 DIAGNOSIS — I82402 Acute embolism and thrombosis of unspecified deep veins of left lower extremity: Secondary | ICD-10-CM | POA: Diagnosis not present

## 2023-04-23 DIAGNOSIS — H9191 Unspecified hearing loss, right ear: Secondary | ICD-10-CM | POA: Diagnosis not present

## 2023-04-23 DIAGNOSIS — L97812 Non-pressure chronic ulcer of other part of right lower leg with fat layer exposed: Secondary | ICD-10-CM | POA: Diagnosis not present

## 2023-04-24 DIAGNOSIS — L97512 Non-pressure chronic ulcer of other part of right foot with fat layer exposed: Secondary | ICD-10-CM | POA: Diagnosis not present

## 2023-04-24 DIAGNOSIS — I739 Peripheral vascular disease, unspecified: Secondary | ICD-10-CM | POA: Diagnosis not present

## 2023-04-24 DIAGNOSIS — M792 Neuralgia and neuritis, unspecified: Secondary | ICD-10-CM | POA: Diagnosis not present

## 2023-04-24 DIAGNOSIS — B351 Tinea unguium: Secondary | ICD-10-CM | POA: Diagnosis not present

## 2023-04-29 DIAGNOSIS — Z961 Presence of intraocular lens: Secondary | ICD-10-CM | POA: Diagnosis not present

## 2023-04-29 DIAGNOSIS — H25812 Combined forms of age-related cataract, left eye: Secondary | ICD-10-CM | POA: Diagnosis not present

## 2023-04-29 DIAGNOSIS — H3530 Unspecified macular degeneration: Secondary | ICD-10-CM | POA: Diagnosis not present

## 2023-04-30 DIAGNOSIS — M4316 Spondylolisthesis, lumbar region: Secondary | ICD-10-CM | POA: Diagnosis not present

## 2023-04-30 DIAGNOSIS — L97812 Non-pressure chronic ulcer of other part of right lower leg with fat layer exposed: Secondary | ICD-10-CM | POA: Diagnosis not present

## 2023-04-30 DIAGNOSIS — U071 COVID-19: Secondary | ICD-10-CM | POA: Diagnosis not present

## 2023-04-30 DIAGNOSIS — M48061 Spinal stenosis, lumbar region without neurogenic claudication: Secondary | ICD-10-CM | POA: Diagnosis not present

## 2023-04-30 DIAGNOSIS — L03115 Cellulitis of right lower limb: Secondary | ICD-10-CM | POA: Diagnosis not present

## 2023-04-30 DIAGNOSIS — M159 Polyosteoarthritis, unspecified: Secondary | ICD-10-CM | POA: Diagnosis not present

## 2023-04-30 DIAGNOSIS — I82402 Acute embolism and thrombosis of unspecified deep veins of left lower extremity: Secondary | ICD-10-CM | POA: Diagnosis not present

## 2023-04-30 DIAGNOSIS — H9191 Unspecified hearing loss, right ear: Secondary | ICD-10-CM | POA: Diagnosis not present

## 2023-04-30 DIAGNOSIS — I872 Venous insufficiency (chronic) (peripheral): Secondary | ICD-10-CM | POA: Diagnosis not present

## 2023-05-06 DIAGNOSIS — H9191 Unspecified hearing loss, right ear: Secondary | ICD-10-CM | POA: Diagnosis not present

## 2023-05-06 DIAGNOSIS — I82402 Acute embolism and thrombosis of unspecified deep veins of left lower extremity: Secondary | ICD-10-CM | POA: Diagnosis not present

## 2023-05-06 DIAGNOSIS — L97812 Non-pressure chronic ulcer of other part of right lower leg with fat layer exposed: Secondary | ICD-10-CM | POA: Diagnosis not present

## 2023-05-06 DIAGNOSIS — M4316 Spondylolisthesis, lumbar region: Secondary | ICD-10-CM | POA: Diagnosis not present

## 2023-05-06 DIAGNOSIS — M159 Polyosteoarthritis, unspecified: Secondary | ICD-10-CM | POA: Diagnosis not present

## 2023-05-06 DIAGNOSIS — U071 COVID-19: Secondary | ICD-10-CM | POA: Diagnosis not present

## 2023-05-06 DIAGNOSIS — L03115 Cellulitis of right lower limb: Secondary | ICD-10-CM | POA: Diagnosis not present

## 2023-05-06 DIAGNOSIS — M48061 Spinal stenosis, lumbar region without neurogenic claudication: Secondary | ICD-10-CM | POA: Diagnosis not present

## 2023-05-06 DIAGNOSIS — I872 Venous insufficiency (chronic) (peripheral): Secondary | ICD-10-CM | POA: Diagnosis not present

## 2023-05-10 DIAGNOSIS — H25812 Combined forms of age-related cataract, left eye: Secondary | ICD-10-CM | POA: Diagnosis not present

## 2023-05-17 DIAGNOSIS — I82402 Acute embolism and thrombosis of unspecified deep veins of left lower extremity: Secondary | ICD-10-CM | POA: Diagnosis not present

## 2023-05-17 DIAGNOSIS — H9191 Unspecified hearing loss, right ear: Secondary | ICD-10-CM | POA: Diagnosis not present

## 2023-05-17 DIAGNOSIS — M48061 Spinal stenosis, lumbar region without neurogenic claudication: Secondary | ICD-10-CM | POA: Diagnosis not present

## 2023-05-17 DIAGNOSIS — M4316 Spondylolisthesis, lumbar region: Secondary | ICD-10-CM | POA: Diagnosis not present

## 2023-05-17 DIAGNOSIS — I872 Venous insufficiency (chronic) (peripheral): Secondary | ICD-10-CM | POA: Diagnosis not present

## 2023-05-17 DIAGNOSIS — U071 COVID-19: Secondary | ICD-10-CM | POA: Diagnosis not present

## 2023-05-17 DIAGNOSIS — L97812 Non-pressure chronic ulcer of other part of right lower leg with fat layer exposed: Secondary | ICD-10-CM | POA: Diagnosis not present

## 2023-05-17 DIAGNOSIS — M159 Polyosteoarthritis, unspecified: Secondary | ICD-10-CM | POA: Diagnosis not present

## 2023-05-17 DIAGNOSIS — L03115 Cellulitis of right lower limb: Secondary | ICD-10-CM | POA: Diagnosis not present

## 2023-05-21 DIAGNOSIS — H9191 Unspecified hearing loss, right ear: Secondary | ICD-10-CM | POA: Diagnosis not present

## 2023-05-21 DIAGNOSIS — M159 Polyosteoarthritis, unspecified: Secondary | ICD-10-CM | POA: Diagnosis not present

## 2023-05-21 DIAGNOSIS — M48061 Spinal stenosis, lumbar region without neurogenic claudication: Secondary | ICD-10-CM | POA: Diagnosis not present

## 2023-05-21 DIAGNOSIS — I872 Venous insufficiency (chronic) (peripheral): Secondary | ICD-10-CM | POA: Diagnosis not present

## 2023-05-21 DIAGNOSIS — L03115 Cellulitis of right lower limb: Secondary | ICD-10-CM | POA: Diagnosis not present

## 2023-05-21 DIAGNOSIS — L97812 Non-pressure chronic ulcer of other part of right lower leg with fat layer exposed: Secondary | ICD-10-CM | POA: Diagnosis not present

## 2023-05-21 DIAGNOSIS — M4316 Spondylolisthesis, lumbar region: Secondary | ICD-10-CM | POA: Diagnosis not present

## 2023-05-21 DIAGNOSIS — U071 COVID-19: Secondary | ICD-10-CM | POA: Diagnosis not present

## 2023-05-21 DIAGNOSIS — I82402 Acute embolism and thrombosis of unspecified deep veins of left lower extremity: Secondary | ICD-10-CM | POA: Diagnosis not present

## 2023-07-03 DIAGNOSIS — M792 Neuralgia and neuritis, unspecified: Secondary | ICD-10-CM | POA: Diagnosis not present

## 2023-07-03 DIAGNOSIS — I739 Peripheral vascular disease, unspecified: Secondary | ICD-10-CM | POA: Diagnosis not present

## 2023-07-03 DIAGNOSIS — L97211 Non-pressure chronic ulcer of right calf limited to breakdown of skin: Secondary | ICD-10-CM | POA: Diagnosis not present

## 2023-07-03 DIAGNOSIS — I83012 Varicose veins of right lower extremity with ulcer of calf: Secondary | ICD-10-CM | POA: Diagnosis not present

## 2023-07-03 DIAGNOSIS — B351 Tinea unguium: Secondary | ICD-10-CM | POA: Diagnosis not present

## 2023-07-22 ENCOUNTER — Encounter (HOSPITAL_BASED_OUTPATIENT_CLINIC_OR_DEPARTMENT_OTHER): Payer: Medicare PPO | Attending: General Surgery | Admitting: General Surgery

## 2023-07-22 DIAGNOSIS — L308 Other specified dermatitis: Secondary | ICD-10-CM | POA: Insufficient documentation

## 2023-07-22 DIAGNOSIS — I890 Lymphedema, not elsewhere classified: Secondary | ICD-10-CM | POA: Diagnosis not present

## 2023-07-22 DIAGNOSIS — I872 Venous insufficiency (chronic) (peripheral): Secondary | ICD-10-CM | POA: Diagnosis not present

## 2023-07-24 DIAGNOSIS — Z86718 Personal history of other venous thrombosis and embolism: Secondary | ICD-10-CM | POA: Diagnosis not present

## 2023-07-24 DIAGNOSIS — K117 Disturbances of salivary secretion: Secondary | ICD-10-CM | POA: Diagnosis not present

## 2023-07-24 DIAGNOSIS — D229 Melanocytic nevi, unspecified: Secondary | ICD-10-CM | POA: Diagnosis not present

## 2023-07-24 DIAGNOSIS — R600 Localized edema: Secondary | ICD-10-CM | POA: Diagnosis not present

## 2023-08-12 ENCOUNTER — Encounter (HOSPITAL_BASED_OUTPATIENT_CLINIC_OR_DEPARTMENT_OTHER): Payer: Medicare PPO | Admitting: General Surgery

## 2023-08-14 ENCOUNTER — Ambulatory Visit: Payer: Medicare PPO | Admitting: Nurse Practitioner

## 2023-08-14 VITALS — BP 110/74 | HR 88 | Wt 215.0 lb

## 2023-08-14 DIAGNOSIS — N760 Acute vaginitis: Secondary | ICD-10-CM

## 2023-08-14 DIAGNOSIS — B9689 Other specified bacterial agents as the cause of diseases classified elsewhere: Secondary | ICD-10-CM

## 2023-08-14 DIAGNOSIS — N765 Ulceration of vagina: Secondary | ICD-10-CM | POA: Diagnosis not present

## 2023-08-14 DIAGNOSIS — Z4689 Encounter for fitting and adjustment of other specified devices: Secondary | ICD-10-CM | POA: Diagnosis not present

## 2023-08-14 DIAGNOSIS — N898 Other specified noninflammatory disorders of vagina: Secondary | ICD-10-CM | POA: Diagnosis not present

## 2023-08-14 LAB — WET PREP FOR TRICH, YEAST, CLUE

## 2023-08-14 MED ORDER — METRONIDAZOLE 500 MG PO TABS: 500.0000 mg | ORAL_TABLET | Freq: Two times a day (BID) | ORAL | 0 refills | Status: DC

## 2023-08-14 NOTE — Progress Notes (Signed)
-----------    Acute Office Visit  Subjective:    Patient ID: [name], female    DOB: [birth year], 83 y.o.   MRN: [id number]   HPI 83 y.o. presents today for vaginal discharge and odor x 2 weeks. Gelhorn pessary in place. Typically seen every 6 months for maintenance but last seen March 2024. She did not realize she had not been seen in a year. Has not removed since then. Denies urinary symptoms, no vaginal bleeding or irritation. Currently on Eliquis for DVT.   No LMP recorded. Patient is postmenopausal.    Review of Systems  Constitutional: Negative.   Genitourinary:  Positive for vaginal discharge. Negative for difficulty urinating, dysuria, flank pain, frequency, genital sores, hematuria, urgency and vaginal pain.       Vaginal odor       Objective:    Physical Exam Constitutional:      Appearance: Normal appearance.  Genitourinary:    General: Normal vulva.     Vagina: Vaginal discharge and erythema present.     Cervix: No erythema.     Comments: Ulcer on posterior vaginal wall, bleeding after pessary removal ~1.5 cm x 1 cm    BP 110/74   Pulse 88   Wt 215 lb (97.5 kg)   SpO2 100%   BMI 32.69 kg/m  Wt Readings from Last 3 Encounters:  08/14/23 215 lb (97.5 kg)  02/25/23 172 lb (78 kg)  09/03/22 206 lb (93.4 kg)        Patient informed chaperone available to be present for breast and/or pelvic exam. Patient has requested no chaperone to be present. Patient has been advised what will be completed during breast and pelvic exam.   Wet prep + clue cells (+ odor)  Assessment & Plan:   Problem List Items Addressed This Visit   None Visit Diagnoses       Bacterial vaginosis    -  Primary   Relevant Medications   metroNIDAZOLE (FLAGYL) 500 MG tablet     Vaginal discharge       Relevant Orders   WET PREP FOR TRICH, YEAST, CLUE     Encounter for pessary maintenance         Vaginal ulceration          Plan: Wet prep positive for clue cells - Flagyl  500 mg BID x 7 days. Unable to self administer gel due to limited mobility. Recommend taking with food. Ulcer present at posterior vaginal wall - silver nitrate x 2 applied, bleeding stopped. Pessary removed and cleaned, provided to patient. Keep out until follow up visit in 4 weeks. Cannot do vaginal estrogen due to DVT.    Return in about 4 weeks (around 09/11/2023) for Pessary maintenance, ulcer.    [name] DNP, 2:58 PM 08/14/2023

## 2023-09-04 DIAGNOSIS — H353212 Exudative age-related macular degeneration, right eye, with inactive choroidal neovascularization: Secondary | ICD-10-CM | POA: Diagnosis not present

## 2023-09-04 DIAGNOSIS — H35053 Retinal neovascularization, unspecified, bilateral: Secondary | ICD-10-CM | POA: Diagnosis not present

## 2023-09-11 ENCOUNTER — Encounter: Payer: Self-pay | Admitting: Nurse Practitioner

## 2023-09-11 ENCOUNTER — Ambulatory Visit: Payer: Medicare PPO | Admitting: Nurse Practitioner

## 2023-09-11 VITALS — BP 138/86 | HR 96

## 2023-09-11 DIAGNOSIS — N765 Ulceration of vagina: Secondary | ICD-10-CM | POA: Diagnosis not present

## 2023-09-11 DIAGNOSIS — Z4689 Encounter for fitting and adjustment of other specified devices: Secondary | ICD-10-CM

## 2023-09-11 DIAGNOSIS — N813 Complete uterovaginal prolapse: Secondary | ICD-10-CM | POA: Diagnosis not present

## 2023-09-11 NOTE — Progress Notes (Signed)
   Acute Office Visit  Subjective:    Patient ID: [name], female    DOB: [birth year], 83 y.o.   MRN: [id number]   HPI 83 y.o. presents today for 4-week follow up. Seen 08/14/23 with complaints of vaginal discharge and odor. Treated for BV. Uses Gelhorn pessary. Pessary removed at last visit to allow healing of vaginal wall pressure lesion. Had not been seen for pessary maintenance in a year at that time. Not able to do vaginal estrogen d/t history of DVT. Requesting pessary replacement due to odor and discoloration.    Review of Systems  Constitutional: Negative.   Genitourinary: Negative.        Objective:    Physical Exam Constitutional:      Appearance: Normal appearance.  Genitourinary:    General: Normal vulva.     Vagina: Normal.     Cervix: Normal.     Uterus: With uterine prolapse.      Comments: Ulcer on posterior vaginal wall, less friable, slightly smaller in size    BP 138/86 (BP Location: Right Arm, Patient Position: Sitting, Cuff Size: Small)   Pulse 96   SpO2 96%  Wt Readings from Last 3 Encounters:  08/14/23 215 lb (97.5 kg)  02/25/23 172 lb (78 kg)  09/03/22 206 lb (93.4 kg)        Patient informed chaperone available to be present for breast and/or pelvic exam. Patient has requested no chaperone to be present. Patient has been advised what will be completed during breast and pelvic exam.   Assessment & Plan:   Problem List Items Addressed This Visit   None Visit Diagnoses       Vaginal ulceration    -  Primary     Encounter for pessary maintenance         Complete uterine prolapse          Plan: Slight improvement in pressure lesion. Recommend keeping pessary out another 4 weeks to allow healing. Agreeable and symptoms without pessary are tolerable. Vit E vaginal suppositories twice weekly until follow up. Pessary replacement ordered.      [name] DNP, 3:24 PM 09/11/2023

## 2023-09-18 DIAGNOSIS — R600 Localized edema: Secondary | ICD-10-CM | POA: Diagnosis not present

## 2023-09-27 DIAGNOSIS — I890 Lymphedema, not elsewhere classified: Secondary | ICD-10-CM | POA: Diagnosis not present

## 2023-09-30 DIAGNOSIS — M792 Neuralgia and neuritis, unspecified: Secondary | ICD-10-CM | POA: Diagnosis not present

## 2023-09-30 DIAGNOSIS — B351 Tinea unguium: Secondary | ICD-10-CM | POA: Diagnosis not present

## 2023-09-30 DIAGNOSIS — I83012 Varicose veins of right lower extremity with ulcer of calf: Secondary | ICD-10-CM | POA: Diagnosis not present

## 2023-09-30 DIAGNOSIS — L97211 Non-pressure chronic ulcer of right calf limited to breakdown of skin: Secondary | ICD-10-CM | POA: Diagnosis not present

## 2023-09-30 DIAGNOSIS — I739 Peripheral vascular disease, unspecified: Secondary | ICD-10-CM | POA: Diagnosis not present

## 2023-10-02 ENCOUNTER — Encounter: Payer: Self-pay | Admitting: Nurse Practitioner

## 2023-10-04 ENCOUNTER — Encounter (HOSPITAL_COMMUNITY): Payer: Self-pay | Admitting: *Deleted

## 2023-10-04 ENCOUNTER — Emergency Department (HOSPITAL_COMMUNITY)

## 2023-10-04 ENCOUNTER — Ambulatory Visit (HOSPITAL_COMMUNITY): Admission: EM | Admit: 2023-10-04 | Discharge: 2023-10-04 | Disposition: A | Discharge status: Home / Self Care

## 2023-10-04 ENCOUNTER — Other Ambulatory Visit: Payer: Self-pay

## 2023-10-04 ENCOUNTER — Inpatient Hospital Stay (HOSPITAL_COMMUNITY)
Admission: EM | Admit: 2023-10-04 | Discharge: 2023-10-08 | DRG: 304 | Disposition: A | Discharge status: Skilled Nursing Facility | Source: Ambulatory Visit | Attending: Internal Medicine | Admitting: Internal Medicine

## 2023-10-04 ENCOUNTER — Inpatient Hospital Stay (HOSPITAL_COMMUNITY)

## 2023-10-04 DIAGNOSIS — R531 Weakness: Secondary | ICD-10-CM | POA: Diagnosis not present

## 2023-10-04 DIAGNOSIS — Z800 Family history of malignant neoplasm of digestive organs: Secondary | ICD-10-CM

## 2023-10-04 DIAGNOSIS — Z79899 Other long term (current) drug therapy: Secondary | ICD-10-CM

## 2023-10-04 DIAGNOSIS — R2689 Other abnormalities of gait and mobility: Secondary | ICD-10-CM | POA: Diagnosis not present

## 2023-10-04 DIAGNOSIS — G936 Cerebral edema: Secondary | ICD-10-CM | POA: Diagnosis not present

## 2023-10-04 DIAGNOSIS — Z9049 Acquired absence of other specified parts of digestive tract: Secondary | ICD-10-CM

## 2023-10-04 DIAGNOSIS — I3481 Nonrheumatic mitral (valve) annulus calcification: Secondary | ICD-10-CM | POA: Diagnosis present

## 2023-10-04 DIAGNOSIS — E785 Hyperlipidemia, unspecified: Secondary | ICD-10-CM | POA: Diagnosis present

## 2023-10-04 DIAGNOSIS — Z87891 Personal history of nicotine dependence: Secondary | ICD-10-CM | POA: Diagnosis not present

## 2023-10-04 DIAGNOSIS — I6602 Occlusion and stenosis of left middle cerebral artery: Secondary | ICD-10-CM | POA: Diagnosis present

## 2023-10-04 DIAGNOSIS — I610 Nontraumatic intracerebral hemorrhage in hemisphere, subcortical: Principal | ICD-10-CM

## 2023-10-04 DIAGNOSIS — I161 Hypertensive emergency: Secondary | ICD-10-CM | POA: Diagnosis not present

## 2023-10-04 DIAGNOSIS — L409 Psoriasis, unspecified: Secondary | ICD-10-CM | POA: Diagnosis present

## 2023-10-04 DIAGNOSIS — M79661 Pain in right lower leg: Secondary | ICD-10-CM | POA: Diagnosis not present

## 2023-10-04 DIAGNOSIS — R238 Other skin changes: Secondary | ICD-10-CM | POA: Diagnosis present

## 2023-10-04 DIAGNOSIS — F419 Anxiety disorder, unspecified: Secondary | ICD-10-CM | POA: Diagnosis present

## 2023-10-04 DIAGNOSIS — Z7401 Bed confinement status: Secondary | ICD-10-CM | POA: Diagnosis not present

## 2023-10-04 DIAGNOSIS — I629 Nontraumatic intracranial hemorrhage, unspecified: Secondary | ICD-10-CM | POA: Diagnosis not present

## 2023-10-04 DIAGNOSIS — M5430 Sciatica, unspecified side: Secondary | ICD-10-CM | POA: Diagnosis present

## 2023-10-04 DIAGNOSIS — Z96653 Presence of artificial knee joint, bilateral: Secondary | ICD-10-CM | POA: Diagnosis present

## 2023-10-04 DIAGNOSIS — I619 Nontraumatic intracerebral hemorrhage, unspecified: Secondary | ICD-10-CM

## 2023-10-04 DIAGNOSIS — I890 Lymphedema, not elsewhere classified: Secondary | ICD-10-CM | POA: Diagnosis present

## 2023-10-04 DIAGNOSIS — R1310 Dysphagia, unspecified: Secondary | ICD-10-CM | POA: Diagnosis not present

## 2023-10-04 DIAGNOSIS — M6281 Muscle weakness (generalized): Secondary | ICD-10-CM | POA: Diagnosis not present

## 2023-10-04 DIAGNOSIS — K219 Gastro-esophageal reflux disease without esophagitis: Secondary | ICD-10-CM | POA: Diagnosis present

## 2023-10-04 DIAGNOSIS — Z79624 Long term (current) use of inhibitors of nucleotide synthesis: Secondary | ICD-10-CM

## 2023-10-04 DIAGNOSIS — I10 Essential (primary) hypertension: Secondary | ICD-10-CM

## 2023-10-04 DIAGNOSIS — I639 Cerebral infarction, unspecified: Secondary | ICD-10-CM | POA: Diagnosis not present

## 2023-10-04 DIAGNOSIS — I6782 Cerebral ischemia: Secondary | ICD-10-CM | POA: Diagnosis not present

## 2023-10-04 DIAGNOSIS — I169 Hypertensive crisis, unspecified: Secondary | ICD-10-CM

## 2023-10-04 DIAGNOSIS — R609 Edema, unspecified: Secondary | ICD-10-CM | POA: Diagnosis not present

## 2023-10-04 DIAGNOSIS — M7989 Other specified soft tissue disorders: Secondary | ICD-10-CM

## 2023-10-04 DIAGNOSIS — Z86718 Personal history of other venous thrombosis and embolism: Secondary | ICD-10-CM

## 2023-10-04 DIAGNOSIS — Z803 Family history of malignant neoplasm of breast: Secondary | ICD-10-CM | POA: Diagnosis not present

## 2023-10-04 DIAGNOSIS — L03115 Cellulitis of right lower limb: Secondary | ICD-10-CM | POA: Diagnosis present

## 2023-10-04 DIAGNOSIS — Z7901 Long term (current) use of anticoagulants: Secondary | ICD-10-CM | POA: Diagnosis not present

## 2023-10-04 DIAGNOSIS — R030 Elevated blood-pressure reading, without diagnosis of hypertension: Secondary | ICD-10-CM | POA: Diagnosis not present

## 2023-10-04 DIAGNOSIS — I6611 Occlusion and stenosis of right anterior cerebral artery: Secondary | ICD-10-CM | POA: Diagnosis present

## 2023-10-04 DIAGNOSIS — R29700 NIHSS score 0: Secondary | ICD-10-CM

## 2023-10-04 DIAGNOSIS — M1990 Unspecified osteoarthritis, unspecified site: Secondary | ICD-10-CM | POA: Diagnosis present

## 2023-10-04 DIAGNOSIS — M810 Age-related osteoporosis without current pathological fracture: Secondary | ICD-10-CM | POA: Diagnosis present

## 2023-10-04 DIAGNOSIS — E876 Hypokalemia: Secondary | ICD-10-CM | POA: Diagnosis not present

## 2023-10-04 DIAGNOSIS — R29818 Other symptoms and signs involving the nervous system: Secondary | ICD-10-CM | POA: Diagnosis not present

## 2023-10-04 DIAGNOSIS — Z91048 Other nonmedicinal substance allergy status: Secondary | ICD-10-CM

## 2023-10-04 DIAGNOSIS — Z9889 Other specified postprocedural states: Secondary | ICD-10-CM

## 2023-10-04 DIAGNOSIS — R58 Hemorrhage, not elsewhere classified: Secondary | ICD-10-CM | POA: Diagnosis not present

## 2023-10-04 LAB — COMPREHENSIVE METABOLIC PANEL WITH GFR
ALT: 14 U/L (ref 0–44)
AST: 17 U/L (ref 15–41)
Albumin: 4.2 g/dL (ref 3.5–5.0)
Alkaline Phosphatase: 111 U/L (ref 38–126)
Anion gap: 9 (ref 5–15)
BUN: 12 mg/dL (ref 8–23)
CO2: 28 mmol/L (ref 22–32)
Calcium: 9.5 mg/dL (ref 8.9–10.3)
Chloride: 104 mmol/L (ref 98–111)
Creatinine, Ser: 0.5 mg/dL (ref 0.44–1.00)
GFR, Estimated: >60 mL/min (ref >60)
Glucose, Bld: 81 mg/dL (ref 70–99)
Potassium: 3.8 mmol/L (ref 3.5–5.1)
Sodium: 141 mmol/L (ref 135–145)
Total Bilirubin: 0.8 mg/dL (ref 0.0–1.2)
Total Protein: 7.7 g/dL (ref 6.5–8.1)

## 2023-10-04 LAB — BRAIN NATRIURETIC PEPTIDE: B Natriuretic Peptide: 128.8 pg/mL — ABNORMAL HIGH (ref 0.0–100.0)

## 2023-10-04 LAB — CBC WITH DIFFERENTIAL/PLATELET
Abs Immature Granulocytes: 0.01 10*3/uL (ref 0.00–0.07)
Basophils Absolute: 0.1 10*3/uL (ref 0.0–0.1)
Basophils Relative: 1 %
Eosinophils Absolute: 0.2 10*3/uL (ref 0.0–0.5)
Eosinophils Relative: 3 %
HCT: 47.4 % — ABNORMAL HIGH (ref 36.0–46.0)
Hemoglobin: 14.3 g/dL (ref 12.0–15.0)
Immature Granulocytes: 0 %
Lymphocytes Relative: 26 %
Lymphs Abs: 1.5 10*3/uL (ref 0.7–4.0)
MCH: 27 pg (ref 26.0–34.0)
MCHC: 30.2 g/dL (ref 30.0–36.0)
MCV: 89.4 fL (ref 80.0–100.0)
Monocytes Absolute: 0.5 10*3/uL (ref 0.1–1.0)
Monocytes Relative: 8 %
Neutro Abs: 3.5 10*3/uL (ref 1.7–7.7)
Neutrophils Relative %: 62 %
Platelets: 184 10*3/uL (ref 150–400)
RBC: 5.3 MIL/uL — ABNORMAL HIGH (ref 3.87–5.11)
RDW: 12.7 % (ref 11.5–15.5)
WBC: 5.7 10*3/uL (ref 4.0–10.5)
nRBC: 0 % (ref 0.0–0.2)

## 2023-10-04 LAB — LIPID PANEL
Cholesterol: 160 mg/dL (ref 0–200)
HDL: 76 mg/dL (ref >40)
LDL Cholesterol: 73 mg/dL (ref 0–99)
Total CHOL/HDL Ratio: 2.1 ratio
Triglycerides: 55 mg/dL (ref <150)
VLDL: 11 mg/dL (ref 0–40)

## 2023-10-04 MED ORDER — ACETAMINOPHEN 325 MG PO TABS: 650.0000 mg | ORAL_TABLET | ORAL | Status: DC | PRN

## 2023-10-04 MED ORDER — EMPTY CONTAINERS FLEXIBLE MISC
900.0000 mg | Freq: Once | Status: AC
  Administered 2023-10-04: 900 mg via INTRAVENOUS
  Filled 2023-10-04: qty 90

## 2023-10-04 MED ORDER — ACETAMINOPHEN 650 MG RE SUPP: 650.0000 mg | RECTAL | Status: DC | PRN

## 2023-10-04 MED ORDER — ACETAMINOPHEN 160 MG/5ML PO SOLN: 650.0000 mg | ORAL | Status: DC | PRN

## 2023-10-04 MED ORDER — CLEVIDIPINE BUTYRATE 0.5 MG/ML IV EMUL
0.0000 mg/h | INTRAVENOUS | Status: DC
  Administered 2023-10-04: 14 mg/h via INTRAVENOUS
  Administered 2023-10-04: 2 mg/h via INTRAVENOUS
  Administered 2023-10-04: 4 mg/h via INTRAVENOUS
  Filled 2023-10-04 (×3): qty 50

## 2023-10-04 MED ORDER — ACETAMINOPHEN 325 MG PO TABS
650.0000 mg | ORAL_TABLET | Freq: Once | ORAL | Status: DC
  Filled 2023-10-04: qty 2

## 2023-10-04 MED ORDER — STROKE: EARLY STAGES OF RECOVERY BOOK: Freq: Once | Status: DC

## 2023-10-04 MED ORDER — SENNOSIDES-DOCUSATE SODIUM 8.6-50 MG PO TABS
1.0000 | ORAL_TABLET | Freq: Two times a day (BID) | ORAL | Status: DC
  Administered 2023-10-05: 1 via ORAL
  Filled 2023-10-04 (×6): qty 1

## 2023-10-04 MED ORDER — PANTOPRAZOLE SODIUM 40 MG IV SOLR
40.0000 mg | Freq: Every day | INTRAVENOUS | Status: DC
  Administered 2023-10-04 – 2023-10-05 (×2): 40 mg via INTRAVENOUS
  Filled 2023-10-04 (×2): qty 10

## 2023-10-04 NOTE — ED Notes (Addendum)
 Patient is being discharged from the Urgent Care and sent to the Emergency Department via POV . Per Asberry Searle, PA-C, patient is in need of higher level of care due to hypertension, leg swelling. Patient is aware and verbalizes understanding of plan of care.  Vitals:   10/04/23 1250 10/04/23 1306  BP: (!) 192/84 (!) 201/66  Pulse: 64   Resp: 18   Temp: (!) 97.5 F (36.4 C)   SpO2: 94%

## 2023-10-04 NOTE — ED Provider Notes (Signed)
 [facility]    CSN: [id number] Arrival date & time: 10/04/23  1158      History   Chief Complaint Chief Complaint  Patient presents with   Blister    HPI [name] is a 83 y.o. female.  Here with blisters on bilateral lower legs. This morning they "busted" and started draining. Causing some discomfort.  Also swelling in the right lower leg for about a week, worse recently per daughter.   Has chronic ulcer of right calf, PVD  Seen 4 days ago by foot&ankle who has followed with her since January. Presented for right lower leg swelling. Dx with varicose veins, ulcer of calf. Was advised to call PCP for outpatient ultrasound or otherwise go to the ED for DVT rule out. Patient did not want to go to the ED. No antibiotics were indicated for ulcer at that time - appeared stable Was given soft cast for foot pains. She removed the cast this morning due to drainage.   History of DVT, on eliquis  Denies chest pain, tightness, or shortness of breath.  No cough  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/2019   Cystoid macular edema of right eye 09/24/2019   Detrusor instability    GERD (gastroesophageal reflux disease)    Osteoporosis    Psoriasis    Sciatic pain    Uterine prolapse     Patient Active Problem List   Diagnosis Date Noted   Cellulitis 08/16/2022   Bilateral lower extremity edema 08/16/2022   GERD (gastroesophageal reflux disease) 08/16/2022   Acute back pain 05/25/2022   Recurrent falls 05/23/2022   Class 1 obesity due to excess calories with body mass index (BMI) of 30.0 to 30.9 in adult 05/23/2022   Advanced nonexudative age-related macular degeneration of right eye with subfoveal involvement 05/04/2021   Early stage nonexudative age-related macular degeneration of left eye 12/01/2020   Nuclear sclerotic cataract of left eye  02/25/2020   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   Exudative age-related macular degeneration of right eye with inactive choroidal neovascularization (HCC) 09/24/2019   Retinal hemorrhage of right eye 09/24/2019   Retinal exudates and deposits 09/24/2019   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   Mastodynia, female 03/19/2016   Cystocele 02/08/2014   Vaginal atrophy 01/28/2014   History of herpes simplex infection 01/28/2014   Knee pain, acute 03/05/2013   Anxiety and depression 02/05/2013   Constipation 02/05/2013   Psoriasis 02/05/2013   Failed total knee arthroplasty (HCC) 01/23/2013   Sciatic pain    Detrusor instability    Uterine prolapse    Atrophic vaginitis    Osteoporosis     Past Surgical History:  Procedure Laterality Date   APPENDECTOMY     FOOT SURGERY     KNEE SURGERY     TOTAL KNEE ARTHROPLASTY Bilateral    X2-- 2009; 2010   TOTAL KNEE REVISION Left 01/23/2013   Procedure:  TOTAL KNEE ARTHROPLASTY REVISION ;  Surgeon: Aurther Blue, MD;  Location: [facility];  Service: Orthopedics;  Laterality: Left;    OB History     Gravida  3   Para  3   Term  3   Preterm      AB      Living  4      SAB      IAB  Ectopic      Multiple  1   Live Births               Home Medications    Prior to Admission medications   Medication Sig Start Date End Date Taking? Authorizing Provider  ELIQUIS 5 MG TABS tablet Take 5 mg by mouth 2 (two) times daily. 05/03/23  Yes [provider]  FLUoxetine  (PROZAC ) 20 MG tablet Take 60 mg by mouth daily.   Yes [provider]  acetaminophen  (TYLENOL ) 325 MG tablet Take 2 tablets (650 mg total) by mouth every 6 (six) hours as needed for mild pain (or Fever >/= 101). 05/27/22   Krishnan, Gokul, MD  Calcium  Carb-Cholecalciferol  (CALCIUM  + VITAMIN D3 PO) Take 1 tablet by mouth daily.    [provider]  cetirizine (ZYRTEC) 10 MG tablet Take 10 mg by mouth daily  as needed for allergies.    [provider]  cyanocobalamin  1000 MCG tablet Take 1 tablet (1,000 mcg total) by mouth daily. 05/30/22   Krishnan, Gokul, MD  feeding supplement (ENSURE ENLIVE / ENSURE PLUS) LIQD Take 237 mLs by mouth 2 (two) times daily between meals. 05/27/22   Krishnan, Gokul, MD  ibuprofen (ADVIL) 200 MG tablet Take 400 mg by mouth daily as needed for headache, moderate pain or mild pain.    [provider]  lidocaine  (LIDODERM ) 5 % Place 1 patch onto the skin daily. Remove & Discard patch within 12 hours or as directed by MD Patient taking differently: Place 1 patch onto the skin daily as needed (pain). 05/27/22   Krishnan, Gokul, MD  LORazepam  (ATIVAN ) 0.5 MG tablet Take 1 tablet (0.5 mg total) by mouth daily as needed for anxiety or sleep. 05/27/22   Krishnan, Gokul, MD  polyethylene glycol (MIRALAX  / GLYCOLAX ) 17 g packet Take 17 g by mouth 2 (two) times daily. 05/28/22   Krishnan, Gokul, MD  senna-docusate (SENOKOT-S) 8.6-50 MG tablet Take 2 tablets by mouth 2 (two) times daily. 05/27/22   Krishnan, Gokul, MD  valACYclovir  (VALTREX ) 500 MG tablet Take 1 tablet (500 mg total) by mouth every other day. 10/30/22   Andee Bamberger, NP    Family History Family History  Problem Relation Age of Onset   Cancer Mother        GALLBLADDER    Breast cancer Cousin        Mat. 1st cousin Age 63    Social History Social History   Tobacco Use   Smoking status: Former    Current packs/day: 0.00    Types: Cigarettes    Quit date: 07/25/2015    Years since quitting: 8.2   Smokeless tobacco: Never  Vaping Use   Vaping status: Never Used  Substance Use Topics   Alcohol use: No    Alcohol/week: 0.0 standard drinks of alcohol   Drug use: No     Allergies   Patient has no known allergies.   Review of Systems Review of Systems Per HPI  Physical Exam Triage Vital Signs ED Triage Vitals [10/04/23 1249]  Encounter Vitals Group     BP      Systolic BP  Percentile      Diastolic BP Percentile      Pulse      Resp      Temp      Temp src      SpO2      Weight      Height  Head Circumference      Peak Flow      Pain Score 0     Pain Loc      Pain Education      Exclude from Growth Chart    No data found.  Updated Vital Signs BP (!) 201/66 (BP Location: Right Arm)   Pulse 64   Temp (!) 97.5 F (36.4 C) (Oral)   Resp 18   SpO2 94%   Visual Acuity Right Eye Distance:   Left Eye Distance:   Bilateral Distance:    Right Eye Near:   Left Eye Near:    Bilateral Near:     Physical Exam Vitals and nursing note reviewed.  Constitutional:      General: She is not in acute distress. HENT:     Mouth/Throat:     Mouth: Mucous membranes are moist.     Pharynx: Oropharynx is clear.  Cardiovascular:     Rate and Rhythm: Normal rate and regular rhythm.     Pulses: Normal pulses.     Heart sounds: Normal heart sounds.  Pulmonary:     Effort: Pulmonary effort is normal.     Breath sounds: Normal breath sounds.  Musculoskeletal:     Cervical back: Normal range of motion.     Right lower leg: Swelling and tenderness present. 2+ Edema present.     Left lower leg: 1+ Edema present.     Comments: Notable swelling of right lower extremity compared to left. Tender posterior calf. There is erythema of distal leg with small blister noted medially, not draining.   Skin:    Capillary Refill: Capillary refill takes less than 2 seconds.  Neurological:     Mental Status: She is alert and oriented to person, place, and time.      UC Treatments / Results  Labs (all labs ordered are listed, but only abnormal results are displayed) Labs Reviewed - No data to display  EKG   Radiology No results found.  Procedures Procedures (including critical care time)  Medications Ordered in UC Medications - No data to display  Initial Impression / Assessment and Plan / UC Course  I have reviewed the triage vital signs and the nursing  notes.  Pertinent labs & imaging results that were available during my care of the patient were reviewed by me and considered in my medical decision making (see chart for details).  BP elevated 192/84, on recheck 201/66 No history of HTN Patient is not having headache, dizziness, vision changes, chest pain or tightness, shortness of breath, abdominal pain, weakness. There is notable swelling of the right lower extremity with posterior calf tenderness.  With elevated blood pressure and swelling/pain, I have recommended patient be evaluated in the emergency department for DVT.  Patient daughter is present and is comfortable transporting her to the ED via POV  Final Clinical Impressions(s) / UC Diagnoses   Final diagnoses:  Right leg swelling  Elevated BP without diagnosis of hypertension  Right calf pain   Discharge Instructions   None    ED Prescriptions   None    PDMP not reviewed this encounter.   Creighton Doffing, [place] 10/04/23 1327

## 2023-10-04 NOTE — H&P (Signed)
 NEUROLOGY H&P NOTE   Date of service: October 04, 2023 Patient Name: [name] MRN:  [id number] DOB:  1940/08/10 Chief Complaint: "ICH"  History of Present Illness  [name] is a 83 y.o. female with hx of anxiety, arthritis, GERD, osteoporosis, psoriasis, DVT on Eliquis who initially presented to urgent care today with blisters on her bilateral lower extremities that had started draining.  In urgent care she was found to have a blood pressure of 192/84 and then 201/66.  It was then recommended that she go to the emergency department for further evaluation.  Once she got to the emergency she also stated that she been having a headache since last night and given her hypertension they proceeded with a CT head.  She was found to have a basal ganglia hemorrhage with some mild edema.  Her Eliquis was reversed with Andexxa  and Cleviprex  was initiated.   Last known well: 4/17 Modified rankin score: {Modified Rankin Scale:21264} ICH Score: 1- age tNKASE: Not offered due to ICH Thrombectomy: not offered due to ICH  NIHSS components Score: Comment  1a Level of Conscious 0[]  1[]  2[]  3[]      1b LOC Questions 0[]  1[]  2[]       1c LOC Commands 0[]  1[]  2[]       2 Best Gaze 0[]  1[]  2[]       3 Visual 0[]  1[]  2[]  3[]      4 Facial Palsy 0[]  1[]  2[]  3[]      5a Motor Arm - left 0[]  1[]  2[]  3[]  4[]  UN[]    5b Motor Arm - Right 0[]  1[]  2[]  3[]  4[]  UN[]    6a Motor Leg - Left 0[]  1[]  2[]  3[]  4[]  UN[]    6b Motor Leg - Right 0[]  1[]  2[]  3[]  4[]  UN[]    7 Limb Ataxia 0[]  1[]  2[]  3[]  UN[]     8 Sensory 0[]  1[]  2[]  UN[]      9 Best Language 0[]  1[]  2[]  3[]      10 Dysarthria 0[]  1[]  2[]  UN[]      11 Extinct. and Inattention 0[]  1[]  2[]       TOTAL:       ROS  ***Comprehensive ROS performed and pertinent positives documented in the HPI. ***Unable to perform due to***  Past History   Past Medical History:  Diagnosis Date   Achilles rupture    Anxiety    Arthritis    Atrophic vaginitis     Choroidal neovascularization of right eye 09/24/2019   Cystoid macular edema of right eye 09/24/2019   Detrusor instability    GERD (gastroesophageal reflux disease)    Osteoporosis    Psoriasis    Sciatic pain    Uterine prolapse    Past Surgical History:  Procedure Laterality Date   APPENDECTOMY     FOOT SURGERY     KNEE SURGERY     TOTAL KNEE ARTHROPLASTY Bilateral    X2-- 2009; 2010   TOTAL KNEE REVISION Left 01/23/2013   Procedure:  TOTAL KNEE ARTHROPLASTY REVISION ;  Surgeon: Aurther Blue, MD;  Location: [facility];  Service: Orthopedics;  Laterality: Left;   Family History  Problem Relation Age of Onset   Cancer Mother        GALLBLADDER    Breast cancer Cousin        Mat. 1st cousin Age 23   Social History   Socioeconomic History   Marital status: Married    Spouse name: Not on file   Number of children: Not on file  Years of education: Not on file   Highest education level: Not on file  Occupational History   Not on file  Tobacco Use   Smoking status: Former    Current packs/day: 0.00    Types: Cigarettes    Quit date: 07/25/2015    Years since quitting: 8.2   Smokeless tobacco: Never  Vaping Use   Vaping status: Never Used  Substance and Sexual Activity   Alcohol use: No    Alcohol/week: 0.0 standard drinks of alcohol   Drug use: No   Sexual activity: Not Currently    Birth control/protection: Post-menopausal    Comment: First IC >89 y/o, <5 Partners  Other Topics Concern   Not on file  Social History Narrative   Not on file   Social Drivers of Health   Financial Resource Strain: Not on file  Food Insecurity: No Food Insecurity (08/17/2022)   Hunger Vital Sign    Worried About Running Out of Food in the Last Year: Never true    Ran Out of Food in the Last Year: Never true  Transportation Needs: No Transportation Needs (08/17/2022)   PRAPARE - Administrator, Civil Service (Medical): No    Lack of Transportation (Non-Medical): No  Physical  Activity: Not on file  Stress: Not on file  Social Connections: Not on file   Allergies  Allergen Reactions   Other Itching and Other (See Comments)    Pollen = Itchy eyes/runny nose    Medications  (Not in a hospital admission)    Vitals   Vitals:   10/04/23 1545 10/04/23 1630 10/04/23 1700 10/04/23 1715  BP: (!) 186/76 (!) 162/65 (!) 149/56 (!) 135/59  Pulse: 71 80 79 73  Resp: 16 16 18 17   Temp:      TempSrc:      SpO2: 91% 93% 93% 93%  Weight:      Height:         Body mass index is 25.17 kg/m.  Physical Exam   Constitutional: Appears well-developed and well-nourished. *** Psych: Affect appropriate to situation. *** Eyes: No scleral injection. *** HENT: No OP obstruction. *** Head: Normocephalic. *** Cardiovascular: Normal rate and regular rhythm. *** Respiratory: Effort normal, non-labored breathing. *** GI: Soft.  No distension. There is no tenderness. *** Skin: Bilateral lower extremity edema right worse than left.  Neurologic Examination   Neuro: Mental Status: Patient is awake, alert, oriented to person, place, month, year, and situation.*** Patient is able to give a clear and coherent history.*** No signs of aphasia or neglect*** Cranial Nerves: II: Visual Fields are full. Pupils are equal, round, and reactive to light.  *** III,IV, VI: EOMI without ptosis or diploplia.  V: Facial sensation is symmetric to temperature VII: Facial movement is symmetric resting and smiling VIII: Hearing is intact to voice X: Palate elevates symmetrically XI: Shoulder shrug is symmetric. XII: Tongue protrudes midline without atrophy or fasciculations.  Motor: Tone is normal. Bulk is normal. 5/5 strength was present in all four extremities. *** Sensory: Sensation is symmetric to light touch and temperature in the arms and legs. No extinction to DSS present. *** Deep Tendon Reflexes: 2+ and symmetric in the biceps and patellae. *** Plantars: Toes are downgoing  bilaterally. *** Cerebellar: FNF and HKS are intact bilaterally***        Labs   CBC:  Recent Labs  Lab 10/04/23 1450  WBC 5.7  NEUTROABS 3.5  HGB 14.3  HCT 47.4*  MCV 89.4  PLT 184    Basic Metabolic Panel:  Lab Results  Component Value Date   NA 141 10/04/2023   K 3.8 10/04/2023   CO2 28 10/04/2023   GLUCOSE 81 10/04/2023   BUN 12 10/04/2023   CREATININE 0.50 10/04/2023   CALCIUM  9.5 10/04/2023   GFRNONAA >60 10/04/2023   GFRAA >90 01/25/2013   Lipid Panel: No results found for: "LDLCALC" HgbA1c: No results found for: "HGBA1C" Urine Drug Screen: No results found for: "LABOPIA", "COCAINSCRNUR", "LABBENZ", "AMPHETMU", "THCU", "LABBARB"  Alcohol Level No results found for: "ETH" INR  Lab Results  Component Value Date   INR 1.02 01/19/2013   APTT  Lab Results  Component Value Date   APTT 26 01/19/2013     CT Head without contrast(Personally reviewed): 1.5 cm focus of parenchymal hemorrhage centered in the left basal ganglia involving the anterior limb of the internal capsule and the lentiform nuclei. Mild surrounding edema.    CT angio Head and Neck with contrast(Personally reviewed): ***  MR Angio head without contrast and Carotid Duplex BL(Personally reviewed): ***  MRI Brain(Personally reviewed): ***  rEEG:  ***  Impression   [name] is a 83 y.o. female anxiety, arthritis, GERD, osteoporosis, psoriasis, DVT on Eliquis who initially presented to urgent care today with blisters on her bilateral lower extremities that had started draining she was found to be hypertensive.  In the emergency department she was found to have a basal ganglia hemorrhage with some mild edema.  Her Eliquis was reversed with Andexxa  and Cleviprex  was initiated.  Plan to admit to ICU for further monitoring.  CT head ordered for 2100.  She will additionally need a stroke workup.  Primary Diagnosis:  Nontraumatic intraparenchymal hemorrhage in the left basal  ganglia, likely due to uncontrolled hypertension in the setting of Eliquis use  Secondary Diagnosis: {Stroke Comorbidities:21266}  Recommendations  - BP goal 130-150 for 24 hours and then less than 160 - Continue cleviprex  until able to start PO BP medications - Admit to ICU - Follow up CT at 2100 - MRI w wo contrast - Vessel imaging  - Frequent neuro checks - Echocardiogram - Risk factor modification - Telemetry monitoring - PT consult, OT consult, Speech consult - Stroke team to follow  ______________________________________________________________________   Signed,  Imogene Mana, NP [facility]

## 2023-10-04 NOTE — ED Triage Notes (Addendum)
 Pt sent from UC to r/o DVT to legs, Pt has lymphedema  due to increased swelling and warmth.  There is concern for her hypertension.

## 2023-10-04 NOTE — ED Provider Notes (Signed)
 [place] EMERGENCY DEPARTMENT AT [facility] Provider Note   CSN: [id number] Arrival date & time: 10/04/23  1332     History  Chief Complaint  Patient presents with   Leg Swelling    [name] is a 83 y.o. female.  HPI 83 year old female presents with high blood pressure and leg swelling.  She was sent here from urgent care.  She has chronic lymphedema to both legs, right worse than left.  She had a right leg wrapped and when she took the wrap off today she thought a blister had burst.  There is a new small wound.  She has not had any fevers or increase in pain to her legs.  Patient and family feels like the dark discoloration to her right leg is worse than typical.  The patient has not had any chest pain or shortness of breath but has been having a headache since last night that she describes as mild and diffuse.  No vision changes or focal weakness/numbness.  She reports no prior history of hypertension.  Home Medications Prior to Admission medications   Medication Sig Start Date End Date Taking? Authorizing Provider  acetaminophen  (TYLENOL ) 325 MG tablet Take 2 tablets (650 mg total) by mouth every 6 (six) hours as needed for mild pain (or Fever >/= 101). 05/27/22   Krishnan, Gokul, MD  Calcium  Carb-Cholecalciferol  (CALCIUM  + VITAMIN D3 PO) Take 1 tablet by mouth daily.    [provider]  cetirizine (ZYRTEC) 10 MG tablet Take 10 mg by mouth daily as needed for allergies.    [provider]  cyanocobalamin  1000 MCG tablet Take 1 tablet (1,000 mcg total) by mouth daily. 05/30/22   Maylene Spear, MD  ELIQUIS 5 MG TABS tablet Take 5 mg by mouth 2 (two) times daily. 05/03/23   [provider]  feeding supplement (ENSURE ENLIVE / ENSURE PLUS) LIQD Take 237 mLs by mouth 2 (two) times daily between meals. 05/27/22   Krishnan, Gokul, MD  FLUoxetine  (PROZAC ) 20 MG tablet Take 60 mg by mouth daily.    [provider]  ibuprofen  (ADVIL) 200 MG tablet Take 400 mg by mouth daily as needed for headache, moderate pain or mild pain.    [provider]  lidocaine  (LIDODERM ) 5 % Place 1 patch onto the skin daily. Remove & Discard patch within 12 hours or as directed by MD Patient taking differently: Place 1 patch onto the skin daily as needed (pain). 05/27/22   Krishnan, Gokul, MD  LORazepam  (ATIVAN ) 0.5 MG tablet Take 1 tablet (0.5 mg total) by mouth daily as needed for anxiety or sleep. 05/27/22   Krishnan, Gokul, MD  polyethylene glycol (MIRALAX  / GLYCOLAX ) 17 g packet Take 17 g by mouth 2 (two) times daily. 05/28/22   Krishnan, Gokul, MD  senna-docusate (SENOKOT-S) 8.6-50 MG tablet Take 2 tablets by mouth 2 (two) times daily. 05/27/22   Krishnan, Gokul, MD  valACYclovir  (VALTREX ) 500 MG tablet Take 1 tablet (500 mg total) by mouth every other day. 10/30/22   Andee Bamberger, NP      Allergies    Patient has no known allergies.    Review of Systems   Review of Systems  Constitutional:  Negative for fever.  Eyes:  Negative for visual disturbance.  Respiratory:  Negative for shortness of breath.   Cardiovascular:  Positive for leg swelling. Negative for chest pain.  Skin:  Positive for color change and wound.  Neurological:  Positive for  headaches. Negative for weakness and numbness.    Physical Exam Updated Vital Signs BP (!) 186/76   Pulse 71   Temp 97.9 F (36.6 C) (Oral)   Resp 16   Ht 5' 8.5" (1.74 m)   Wt 76.2 kg   SpO2 91%   BMI 25.17 kg/m  Physical Exam Vitals and nursing note reviewed.  Constitutional:      Appearance: She is well-developed.  HENT:     Head: Normocephalic and atraumatic.  Eyes:     Extraocular Movements: Extraocular movements intact.     Pupils: Pupils are equal, round, and reactive to light.  Cardiovascular:     Rate and Rhythm: Normal rate and regular rhythm.     Pulses:          Dorsalis pedis pulses are detected w/ Doppler on the right side and detected w/  Doppler on the left side.     Heart sounds: Normal heart sounds.  Pulmonary:     Effort: Pulmonary effort is normal.     Breath sounds: Normal breath sounds.  Abdominal:     Palpations: Abdomen is soft.     Tenderness: There is no abdominal tenderness.  Skin:    General: Skin is warm and dry.     Comments: Both lower extremities are swollen.  The right is more swollen than the left but both are symmetric.  The feet do not seem to be significantly involved.  There is warmth throughout both lower extremities, not increased on 1 side versus the other.  There is dark red discoloration to the anterior aspect of the right lower leg.  There is a very superficial wound to the medial aspect without any obvious drainage or fluctuance.  There is some generalized discomfort to both legs with palpation but no pain out of proportion.  Neurological:     Mental Status: She is alert and oriented to person, place, and time.     Comments: CN 3-12 grossly intact. 5/5 strength in all 4 extremities. Grossly normal sensation. Normal finger to nose.      ED Results / Procedures / Treatments   Labs (all labs ordered are listed, but only abnormal results are displayed) Labs Reviewed  CBC WITH DIFFERENTIAL/PLATELET - Abnormal; Notable for the following components:      Result Value   RBC 5.30 (*)    HCT 47.4 (*)    All other components within normal limits  COMPREHENSIVE METABOLIC PANEL WITH GFR  BRAIN NATRIURETIC PEPTIDE  HEMOGLOBIN A1C  LIPID PANEL    EKG EKG Interpretation Date/Time:  Friday October 04 2023 14:29:35 EDT Ventricular Rate:  62 PR Interval:  211 QRS Duration:  99 QT Interval:  496 QTC Calculation: 504 R Axis:   71  Text Interpretation: Sinus rhythm Probable left ventricular hypertrophy Nonspecific T abnormalities, lateral leads Prolonged QT interval Confirmed by Jerilynn Montenegro [phone]) on 10/04/2023 3:50:16 PM  Radiology No results found.  Procedures .Critical Care  Performed by:  Jerilynn Montenegro, MD Authorized by: Jerilynn Montenegro, MD   Critical care provider statement:    Critical care time (minutes):  35   Critical care time was exclusive of:  Separately billable procedures and treating other patients   Critical care was necessary to treat or prevent imminent or life-threatening deterioration of the following conditions:  CNS failure or compromise   Critical care was time spent personally by me on the following activities:  Development of treatment plan with patient or surrogate, discussions with consultants, evaluation  of patient's response to treatment, examination of patient, ordering and review of laboratory studies, ordering and review of radiographic studies, ordering and performing treatments and interventions, pulse oximetry, re-evaluation of patient's condition and review of old charts     Medications Ordered in ED Medications  acetaminophen  (TYLENOL ) tablet 650 mg (650 mg Oral Not Given 10/04/23 1431)  clevidipine  (CLEVIPREX ) infusion 0.5 mg/mL (has no administration in time range)  coag fact Xa recombinant (ANDEXXA ) low dose infusion 900 mg (has no administration in time range)   stroke: early stages of recovery book (has no administration in time range)  acetaminophen  (TYLENOL ) tablet 650 mg (has no administration in time range)    Or  acetaminophen  (TYLENOL ) 160 MG/5ML solution 650 mg (has no administration in time range)    Or  acetaminophen  (TYLENOL ) suppository 650 mg (has no administration in time range)  senna-docusate (Senokot-S) tablet 1 tablet (has no administration in time range)  pantoprazole  (PROTONIX ) injection 40 mg (has no administration in time range)    ED Course/ Medical Decision Making/ A&P                                 Medical Decision Making Amount and/or Complexity of Data Reviewed Labs: ordered.    Details: Normal hemoglobin Radiology: ordered and independent interpretation performed.    Details: Basal ganglia  hemorrhage ECG/medicine tests: ordered and independent interpretation performed.    Details: Nonspecific T waves  Risk OTC drugs. Decision regarding hospitalization.   Patient presents with a chief concern of her leg swelling but also has been having a headache since last night and is quite hypertensive here.  No neurodeficits on exam.  She is well-appearing and awake with no slurred speech.  CT head has been viewed/interpreted by myself and I agree with radiology, who also discussed the CT with.  She appears to have a basal ganglia hemorrhage with some mild edema.  After this, Cleviprex  and Andexxa  have been ordered.  Last took her Eliquis this morning around 8 AM.  I also consulted neurology, Dr. Murvin Arthurs.  He will admit the patient to the neuro service in the ICU at [facility].  Patient was reassessed and is continued to do well, remains hypertensive.  Of note, her DVT study was negative.  My suspicion is she has chronic lymphedema with stasis dermatitis rather than cellulitis.        Final Clinical Impression(s) / ED Diagnoses Final diagnoses:  Basal ganglia hemorrhage (HCC)  Hypertensive crisis    Rx / DC Orders ED Discharge Orders     None         Jerilynn Montenegro, MD 10/04/23 [phone]

## 2023-10-04 NOTE — ED Notes (Signed)
 This nurse placed pt on 2L [place] for O2 sat of 88%

## 2023-10-04 NOTE — Progress Notes (Signed)
 BLE venous duplex has been completed.  Preliminary results given to Dr. Aldean Amass.   Results can be found under chart review under CV PROC. 10/04/2023 4:25 PM Tabithia Stroder RVT, RDMS

## 2023-10-04 NOTE — ED Triage Notes (Signed)
 Pt states she has bilateral lymphedema and she has blisters on both legs that have busted. She noticed them this morning. She states her provider wrapped her legs but she took it off this morning due to it being dirty.

## 2023-10-04 NOTE — ED Notes (Signed)
 Pt taken by CT tech to CT

## 2023-10-05 ENCOUNTER — Encounter (HOSPITAL_COMMUNITY)

## 2023-10-05 ENCOUNTER — Inpatient Hospital Stay (HOSPITAL_COMMUNITY)

## 2023-10-05 DIAGNOSIS — I10 Essential (primary) hypertension: Secondary | ICD-10-CM | POA: Diagnosis not present

## 2023-10-05 DIAGNOSIS — I619 Nontraumatic intracerebral hemorrhage, unspecified: Secondary | ICD-10-CM

## 2023-10-05 DIAGNOSIS — I610 Nontraumatic intracerebral hemorrhage in hemisphere, subcortical: Secondary | ICD-10-CM

## 2023-10-05 DIAGNOSIS — R29700 NIHSS score 0: Secondary | ICD-10-CM | POA: Diagnosis not present

## 2023-10-05 LAB — ECHOCARDIOGRAM COMPLETE
AR max vel: 2.39 cm2
AV Area VTI: 2.16 cm2
AV Area mean vel: 2.25 cm2
AV Mean grad: 4 mmHg
AV Peak grad: 7.1 mmHg
Ao pk vel: 1.33 m/s
Area-P 1/2: 3.48 cm2
Height: 68.5 in
S' Lateral: 3.3 cm
Weight: 2688 [oz_av]

## 2023-10-05 MED ORDER — HYDRALAZINE HCL 20 MG/ML IJ SOLN: 5.0000 mg | INTRAMUSCULAR | Status: DC | PRN

## 2023-10-05 MED ORDER — OYSTER SHELL CALCIUM/D3 500-5 MG-MCG PO TABS
1.0000 | ORAL_TABLET | Freq: Every day | ORAL | Status: DC
  Administered 2023-10-05 – 2023-10-08 (×4): 1 via ORAL
  Filled 2023-10-05 (×5): qty 1

## 2023-10-05 MED ORDER — FUROSEMIDE 20 MG PO TABS
20.0000 mg | ORAL_TABLET | Freq: Every day | ORAL | Status: DC
  Administered 2023-10-05: 20 mg via ORAL
  Filled 2023-10-05: qty 1

## 2023-10-05 MED ORDER — VITAMIN B-12 1000 MCG PO TABS
1000.0000 ug | ORAL_TABLET | Freq: Every day | ORAL | Status: DC
  Administered 2023-10-05 – 2023-10-08 (×4): 1000 ug via ORAL
  Filled 2023-10-05 (×4): qty 1

## 2023-10-05 MED ORDER — HEPARIN SODIUM (PORCINE) 5000 UNIT/ML IJ SOLN
5000.0000 [IU] | Freq: Three times a day (TID) | INTRAMUSCULAR | Status: DC
  Administered 2023-10-05 – 2023-10-08 (×8): 5000 [IU] via SUBCUTANEOUS
  Filled 2023-10-05 (×8): qty 1

## 2023-10-05 MED ORDER — AMLODIPINE BESYLATE 5 MG PO TABS
5.0000 mg | ORAL_TABLET | Freq: Every day | ORAL | Status: DC
  Administered 2023-10-05 – 2023-10-08 (×4): 5 mg via ORAL
  Filled 2023-10-05 (×4): qty 1

## 2023-10-05 MED ORDER — FLUOXETINE HCL 20 MG PO CAPS
60.0000 mg | ORAL_CAPSULE | Freq: Every day | ORAL | Status: DC
Start: 2023-10-05 — End: 2023-10-08
  Administered 2023-10-05 – 2023-10-08 (×4): 60 mg via ORAL
  Filled 2023-10-05 (×4): qty 3

## 2023-10-05 MED ORDER — CHLORHEXIDINE GLUCONATE CLOTH 2 % EX PADS
6.0000 | MEDICATED_PAD | Freq: Every day | CUTANEOUS | Status: DC
Start: 2023-10-05 — End: 2023-10-08
  Administered 2023-10-05 – 2023-10-07 (×3): 6 via TOPICAL

## 2023-10-05 NOTE — Evaluation (Signed)
 Physical Therapy Evaluation Patient Details Name: [name] MRN: [id number] DOB: [birth year] Today's Date: 10/05/2023  History of Present Illness  83 yo female presents to [facility] on 4/18 with LE blistering. CTH shows L basal ganglia hemorrhage with some mild edema. PMH - anxiety, neck pain, achilles rupture, TKR x 2, revision TKR, osteoporosis, macular degeneration, chronic lymphedema.  Clinical Impression   Pt presents with LE weakness, impaired balance with history of multiple falls, impaired activity tolerance. Pt to benefit from acute PT to address deficits. Pt ambulated short room distance, to and from toilet, overall requiring min-mod physical assist for mobility. Per pt and son, pt is typically independent with mobility in home with RW, but has had several falls. Pt's husband is unable to physically assist her given his own health issues. Patient will benefit from continued inpatient follow up therapy, <3 hours/day. PT to progress mobility as tolerated, and will continue to follow acutely.          If plan is discharge home, recommend the following: A lot of help with bathing/dressing/bathroom;A lot of help with walking and/or transfers   Can travel by private vehicle   Yes    Equipment Recommendations None recommended by PT  Recommendations for Other Services       Functional Status Assessment Patient has had a recent decline in their functional status and demonstrates the ability to make significant improvements in function in a reasonable and predictable amount of time.     Precautions / Restrictions Precautions Precautions: Fall Precaution/Restrictions Comments: BLE lymphedema, SBP 130-150 Restrictions Weight Bearing Restrictions Per Provider Order: No      Mobility  Bed Mobility Overal bed mobility: Needs Assistance Bed Mobility: Supine to Sit     Supine to sit: Mod assist     General bed mobility comments: assist for LE progression to EOB, trunk  elevation. Cues for sequencing, pt scooting self to EOB    Transfers Overall transfer level: Needs assistance Equipment used: Rolling walker (2 wheels) Transfers: Sit to/from Stand Sit to Stand: Min assist           General transfer comment: assist for initial power up and steadying once standing. stand x2, from EOB and from [facility].    Ambulation/Gait Ambulation/Gait assistance: Min assist Gait Distance (Feet): 12 Feet (x2 - to and from toilet) Assistive device: Rolling walker (2 wheels) Gait Pattern/deviations: Step-through pattern, Decreased stride length, Trunk flexed Gait velocity: decr     General Gait Details: assist to steady, guide RW  Stairs            Wheelchair Mobility     Tilt Bed    Modified Rankin (Stroke Patients Only)       Balance Overall balance assessment: Needs assistance, History of Falls Sitting-balance support: No upper extremity supported, Feet supported Sitting balance-Leahy Scale: Fair     Standing balance support: Bilateral upper extremity supported, During functional activity, Reliant on assistive device for balance Standing balance-Leahy Scale: Poor Standing balance comment: reliant on RW                             Pertinent Vitals/Pain Pain Assessment Pain Assessment: Faces Faces Pain Scale: Hurts little more Pain Location: RLE Pain Descriptors / Indicators: Grimacing, Guarding, Sore Pain Intervention(s): Limited activity within patient's tolerance, Monitored during session, Repositioned    Home Living Family/patient expects to be discharged to:: Private residence Living Arrangements: Spouse/significant other Available Help at Discharge: Family;Available  24 hours/day Type of Home: House Home Access: Stairs to enter Entrance Stairs-Rails: Right;Left (wroughtiron porch that she uses as rails) Secretary/administrator of Steps: 1   Home Layout: Able to live on main level with bedroom/bathroom Home Equipment:  Rollator (4 wheels);Rolling Walker (2 wheels);BSC/3in1;Shower seat      Prior Function Prior Level of Function : History of Falls (last six months);Needs assist             Mobility Comments: pt sleeps in lift chair, ambulates with RW for short distances in home. pt's children take her to appointments ADLs Comments: pt sponge bathes, pt's husband aide through the [place] (M-F, 5 hours/day) cleans house and meal preps     Extremity/Trunk Assessment   Upper Extremity Assessment Upper Extremity Assessment: Defer to OT evaluation    Lower Extremity Assessment Lower Extremity Assessment: Generalized weakness    Cervical / Trunk Assessment Cervical / Trunk Assessment: Normal  Communication   Communication Communication: No apparent difficulties    Cognition Arousal: Alert Behavior During Therapy: WFL for tasks assessed/performed   PT - Cognitive impairments: Sequencing                       PT - Cognition Comments: cues for sequencing mobility tasks Following commands: Impaired Following commands impaired: Follows one step commands with increased time     Cueing Cueing Techniques: Verbal cues, Gestural cues     General Comments General comments (skin integrity, edema, etc.): wounds on RLE. SBP 148 sitting EOB, 169 s/p gait, RN notified.    Exercises     Assessment/Plan    PT Assessment Patient needs continued PT services  PT Problem List Decreased strength;Decreased mobility;Decreased activity tolerance;Decreased balance;Decreased knowledge of use of DME;Pain;Decreased safety awareness;Cardiopulmonary status limiting activity       PT Treatment Interventions DME instruction;Therapeutic activities;Gait training;Therapeutic exercise;Patient/family education;Balance training;Functional mobility training;Neuromuscular re-education    PT Goals (Current goals can be found in the Care Plan section)  Acute Rehab PT Goals PT Goal Formulation: With patient Time For  Goal Achievement: 10/19/23 Potential to Achieve Goals: Good    Frequency Min 2X/week     Co-evaluation               AM-PAC PT "6 Clicks" Mobility  Outcome Measure Help needed turning from your back to your side while in a flat bed without using bedrails?: A Lot Help needed moving from lying on your back to sitting on the side of a flat bed without using bedrails?: A Lot Help needed moving to and from a bed to a chair (including a wheelchair)?: A Lot Help needed standing up from a chair using your arms (e.g., wheelchair or bedside chair)?: A Little Help needed to walk in hospital room?: A Lot Help needed climbing 3-5 steps with a railing? : Total 6 Click Score: 12    End of Session   Activity Tolerance: Patient limited by fatigue Patient left: in chair;with call bell/phone within reach;with chair alarm set Nurse Communication: Mobility status PT Visit Diagnosis: Other abnormalities of gait and mobility (R26.89);Muscle weakness (generalized) (M62.81)    Time: 5732-2025 PT Time Calculation (min) (ACUTE ONLY): 37 min   Charges:   PT Evaluation $PT Eval Low Complexity: 1 Low PT Treatments $Therapeutic Activity: 8-22 mins PT General Charges $$ ACUTE PT VISIT: 1 Visit         Dyson Sevey S, PT DPT Acute Rehabilitation Services Secure Chat Preferred  Office [phone]   Jaqwon Manfred E  Stroup 10/05/2023, 2:09 PM

## 2023-10-05 NOTE — Progress Notes (Signed)
 PT Cancellation Note  Patient Details Name: [name] MRN: [id number] DOB: Oct 07, 1940   Cancelled Treatment:    Reason Eval/Treat Not Completed: Active bedrest order  Koraline Phillipson S, PT DPT Acute Rehabilitation Services Secure Chat Preferred  Office [phone]  Daiki Dicostanzo Cydney Draft 10/05/2023, 7:15 AM

## 2023-10-05 NOTE — ED Notes (Signed)
 This nurse gave report to Miller Allis, RN with [facility].

## 2023-10-05 NOTE — Progress Notes (Signed)
  Echocardiogram 2D Echocardiogram has been performed.  Bambi Lever P Odin Mariani 10/05/2023, 1:16 PM

## 2023-10-05 NOTE — ED Notes (Signed)
[facility] notified.   

## 2023-10-05 NOTE — Progress Notes (Addendum)
 STROKE TEAM PROGRESS NOTE   INTERIM HISTORY/SUBJECTIVE Patient presented yesterday urgent care with headache and elevated blood pressure in the 190s 200s and it was recommended she come to the ED for further evaluation.  CT head revealed left basal ganglia hemorrhage with mild edema Family at the bedside. Patient is awake and alert in no apparent distress.  She has no aphasia or dysarthria pupils are equal and reactive, she tracks, no facial droop no weakness in any extremity, does have some diminished fine motor skills on the right hand, sensation is intact and no ataxia noted  MRI/MRA was done this morning reviewed by Dr. Janett Medin official read is still pending  She will transfer out of the ICU later today.  Will ask medical hospitalist to assume care tomorrow  OBJECTIVE  CBC    Component Value Date/Time   WBC 5.7 10/04/2023 1450   RBC 5.30 (H) 10/04/2023 1450   HGB 14.3 10/04/2023 1450   HCT 47.4 (H) 10/04/2023 1450   PLT 184 10/04/2023 1450   MCV 89.4 10/04/2023 1450   MCH 27.0 10/04/2023 1450   MCHC 30.2 10/04/2023 1450   RDW 12.7 10/04/2023 1450   LYMPHSABS 1.5 10/04/2023 1450   MONOABS 0.5 10/04/2023 1450   EOSABS 0.2 10/04/2023 1450   BASOSABS 0.1 10/04/2023 1450    BMET    Component Value Date/Time   NA 141 10/04/2023 1450   K 3.8 10/04/2023 1450   CL 104 10/04/2023 1450   CO2 28 10/04/2023 1450   GLUCOSE 81 10/04/2023 1450   BUN 12 10/04/2023 1450   CREATININE 0.50 10/04/2023 1450   CALCIUM  9.5 10/04/2023 1450   GFRNONAA >60 10/04/2023 1450    IMAGING past 24 hours CT HEAD POST STROKE FOLLOWUP/TIMED/STAT READ Result Date: 10/04/2023 CLINICAL DATA:  Code stroke.  Neuro deficit, acute, stroke suspected EXAM: CT HEAD WITHOUT CONTRAST TECHNIQUE: Contiguous axial images were obtained from the base of the skull through the vertex without intravenous contrast. RADIATION DOSE REDUCTION: This exam was performed according to the departmental dose-optimization program which  includes automated exposure control, adjustment of the mA and/or kV according to patient size and/or use of iterative reconstruction technique. COMPARISON:  CT head from earlier today. FINDINGS: Brain: Stable intraparenchymal hemorrhage in the left basal ganglia. No progressive mass effect. No midline shift. No evidence of acute large vascular territory infarct. Patchy white matter hypodensities are nonspecific but compatible with chronic microvascular disease. Vascular: No hyperdense vessel.  Calcific atherosclerosis. Skull: No acute fracture. Sinuses/Orbits: No acute finding. IMPRESSION: Stable intraparenchymal hemorrhage in the left basal ganglia. No progressive mass effect. Electronically Signed   By: Stevenson Elbe M.D.   On: 10/04/2023 21:09   VAS US  LOWER EXTREMITY VENOUS (DVT) (ONLY [facility] & [facility]) Result Date: 10/04/2023  Lower Venous DVT Study Patient Name:  LILLYBETH TAL  Date of Exam:   10/04/2023 Medical Rec #: [id number]           Accession #:    [id number] Date of Birth: February 25, 1941           Patient Gender: F Patient Age:   83 years Exam Location:  [facility] Procedure:      VAS US  LOWER EXTREMITY VENOUS (DVT) Referring Phys: Geralyn Knee GOLDSTON --------------------------------------------------------------------------------  Indications: Edema, Pain, and Erythema.  Risk Factors: Hx of DVT per patient. Anticoagulation: Eliquis. Limitations: Body habitus and poor ultrasound/tissue interface. Comparison Study: Previous exam on 08/17/2022 was negative for DVT Performing Technologist: Arlyce Berger RVT, RDMS  Examination Guidelines: A  complete evaluation includes B-mode imaging, spectral Doppler, color Doppler, and power Doppler as needed of all accessible portions of each vessel. Bilateral testing is considered an integral part of a complete examination. Limited examinations for reoccurring indications may be performed as noted. The reflux portion of the exam is performed with the patient in reverse  Trendelenburg.  +---------+---------------+---------+-----------+----------+-------------------+ RIGHT    CompressibilityPhasicitySpontaneityPropertiesThrombus Aging      +---------+---------------+---------+-----------+----------+-------------------+ CFV      Full           Yes      Yes                                      +---------+---------------+---------+-----------+----------+-------------------+ SFJ      Full                                                             +---------+---------------+---------+-----------+----------+-------------------+ FV Prox  Full           Yes      Yes                                      +---------+---------------+---------+-----------+----------+-------------------+ FV Mid   Full           Yes      Yes                                      +---------+---------------+---------+-----------+----------+-------------------+ FV DistalFull           Yes      Yes                                      +---------+---------------+---------+-----------+----------+-------------------+ PFV      Full                                                             +---------+---------------+---------+-----------+----------+-------------------+ POP      Full           Yes      Yes                                      +---------+---------------+---------+-----------+----------+-------------------+ PTV      Full                                         Not well visualized +---------+---------------+---------+-----------+----------+-------------------+ PERO     Full  Not well visualized +---------+---------------+---------+-----------+----------+-------------------+   +---------+---------------+---------+-----------+----------+--------------+ LEFT     CompressibilityPhasicitySpontaneityPropertiesThrombus Aging +---------+---------------+---------+-----------+----------+--------------+  CFV      Full           Yes      Yes                                 +---------+---------------+---------+-----------+----------+--------------+ SFJ      Full                                                        +---------+---------------+---------+-----------+----------+--------------+ FV Prox  Full           Yes      Yes                                 +---------+---------------+---------+-----------+----------+--------------+ FV Mid   Full           Yes      Yes                                 +---------+---------------+---------+-----------+----------+--------------+ FV DistalFull           Yes      Yes                                 +---------+---------------+---------+-----------+----------+--------------+ PFV      Full                                                        +---------+---------------+---------+-----------+----------+--------------+ POP      Full           Yes      Yes                                 +---------+---------------+---------+-----------+----------+--------------+ PTV      Full                                                        +---------+---------------+---------+-----------+----------+--------------+ PERO     Full                                                        +---------+---------------+---------+-----------+----------+--------------+     Summary: BILATERAL: - No evidence of deep vein thrombosis seen in the lower extremities, bilaterally. -No evidence of popliteal cyst, bilaterally. -Ultrasound characteristics of enlarged lymph nodes are noted in the groin, bilaterally. - Subcutaneous edema extending from knee to ankle, bilaterally.  *See table(s) above for measurements and observations.    Preliminary  CT Head Wo Contrast Result Date: 10/04/2023 CLINICAL DATA:  Headache, leg swelling, hypertension. EXAM: CT HEAD WITHOUT CONTRAST TECHNIQUE: Contiguous axial images were obtained from the base of the  skull through the vertex without intravenous contrast. RADIATION DOSE REDUCTION: This exam was performed according to the departmental dose-optimization program which includes automated exposure control, adjustment of the mA and/or kV according to patient size and/or use of iterative reconstruction technique. COMPARISON:  CT head 02/25/2023. FINDINGS: Brain: 1.5 x 1.0 x 1.1 cm focus of hemorrhage centered in the left basal ganglia with involvement of the anterior limb of the internal capsule and lentiform nuclei. Surrounding edema within the left basal ganglia extending into the subinsular region. No significant midline shift. No CT evidence of acute infarct. Nonspecific hypoattenuation in the periventricular and subcortical white matter favored to reflect chronic microvascular ischemic changes. The basilar cisterns are patent. Ventricles: Prominence of the ventricles suggesting underlying parenchymal volume loss. Vascular: Atherosclerotic calcifications of the carotid siphons. No hyperdense vessel. Skull: No acute or aggressive finding. Orbits: Orbits are symmetric. Sinuses: The visualized paranasal sinuses are clear. Other: Mastoid air cells are clear. IMPRESSION: 1.5 cm focus of parenchymal hemorrhage centered in the left basal ganglia involving the anterior limb of the internal capsule and the lentiform nuclei. Mild surrounding edema. No midline shift. Recommend short interval follow-up head CT. Chronic microvascular ischemic changes and parenchymal volume loss. These results were called by telephone at the time of interpretation on 10/04/2023 at 3:41 pm to provider Jerilynn Montenegro , who verbally acknowledged these results. Electronically Signed   By: Denny Flack M.D.   On: 10/04/2023 15:50    Vitals:   10/05/23 0400 10/05/23 0500 10/05/23 0600 10/05/23 0700  BP: (!) 116/47 (!) 138/55 (!) 136/48 (!) 143/48  Pulse: (!) 58 66 (!) 57 (!) 59  Resp: 16 20 16  (!) 25  Temp:      TempSrc:      SpO2: 93% 91%  92% 90%  Weight:      Height:         PHYSICAL EXAM General:  Alert, well-nourished, well-developed patient in no acute distress Psych:  Mood and affect appropriate for situation CV: Regular rate and rhythm on monitor Respiratory:  Regular, unlabored respirations on room air GI: Abdomen soft and nontender EXT: Right lower leg with redness and swelling, left lower leg with edema   NEURO:  Mental Status: AA&Ox3, patient is able to give clear and coherent history Speech/Language: speech is without dysarthria or aphasia.  Naming, repetition, fluency, and comprehension intact.  Cranial Nerves:  II: PERRL. Visual fields full.  III, IV, VI: EOMI. Eyelids elevate symmetrically.  V: Sensation is intact to light touch and symmetrical to face.  VII: Face is symmetrical resting and smiling VIII: hearing intact to voice. IX, X: Palate elevates symmetrically. Phonation is normal.  XB:[id number] shrug 5/5. XII: tongue is midline without fasciculations. Motor: 5/5 strength to all muscle groups tested.  Some decreased fine motor skills on the right hand Tone: is normal and bulk is normal Sensation- Intact to light touch bilaterally. Extinction absent to light touch to DSS.   Coordination: FTN intact bilaterally, HKS: no ataxia in BLE.No drift.  Gait- deferred  Most Recent NIH 0   ASSESSMENT/PLAN  Ms. CATHLYN TERSIGNI is a 83 y.o. female with history of anxiety, arthritis, GERD, osteoporosis, psoriasis, DVT on Eliquis who initially presented to urgent care today with blisters on her bilateral lower extremities that had started draining. In urgent care she  was found to have a blood pressure of 192/84 and then 201/66. Aaron Aas  NIH on Admission 0, ICH score 1  Intracerebral Hemorrhage: Acute nontraumatic left ganglia reversed with Andexxa  Etiology: Likely hypertensive associated with Eliquis use CT head 1.5 cm focus of parenchymal hemorrhage centered in the left basal ganglia involving the anterior  limb of the internal capsule and the lentiform nuclei. Mild surrounding edema MRI official read pending MRA official read pending Carotid ultrasound ordered 2D Echo EF 60 to 65%.  LV with grade 1 diastolic dysfunction.  RV mildly enlarged.  Left atrium moderately dilated, right atrium mildly dilated.  MV moderate to severe mitral annular calcification LDL 73 HgbA1c pending VTE prophylaxis -SCDs Eliquis (apixaban) daily prior to admission, now on No antithrombotic  Therapy recommendations:  Pending Disposition: Ending  History DVT Left posterior tibial vein in August 2024 on Eliquis Eliquis currently on hold due to acute ICH Eliquis still needed?  May need to consider IVC filter versus restarting Eliquis  Hypertension Home meds: Lasix  20 mg Stable Blood Pressure Goal: SBP between 130-150 for 24 hours and then less than 160   Hyperlipidemia Home meds: None LDL 73, goal < 70   Tobacco Abuse Former cigarette smoker   Dysphagia Patient has post-stroke dysphagia, SLP consulted    Diet   Diet Heart Room service appropriate? Yes; Fluid consistency: Thin   Advance diet as tolerated  ?  Cellulitis Right lower extremity with edema, calf is painful with small amount of blistering Ultrasound lower extremities yesterday with no evidence of DVT, subcutaneous edema extending knee to ankle bilaterally  Other Stroke Risk Factors    Other Active Problems GERD Anxiety Prozac  restarted  Hospital day # 1   Jonette Nestle DNP, ACNPC-AG  [facility]  I have personally obtained history,examined this patient, reviewed notes, independently viewed imaging studies, participated in medical decision making and plan of care.ROS completed by me personally and pertinent positives fully documented  I have made any additions or clarifications directly to the above note. Agree with note above.  Patient presented with hypertensive emergency with small left basal ganglia hemorrhage likely  due to combination of hypertension and Eliquis usage.  She received Andexxa  for reversal of Eliquis.  She has remained neurologically stable.  MRI scan today shows stable appearance of left basal ganglia hemorrhage with minimum cerebral edema with no intraventricular extension or midline shift.  Continue strict blood pressure control with systolic goal between 130-150 for the first 24 hours and then below 160.  Mobilize out of bed.  Therapy consults.  Will consider transfer out of ICU tomorrow if stable.  Long discussion with patient and son and daughter at the bedside and answered questions.This patient is critically ill and at significant risk of neurological worsening, death and care requires constant monitoring of vital signs, hemodynamics,respiratory and cardiac monitoring, extensive review of multiple databases, frequent neurological assessment, discussion with family, other specialists and medical decision making of high complexity.I have made any additions or clarifications directly to the above note.This critical care time does not reflect procedure time, or teaching time or supervisory time of PA/NP/Med Resident etc but could involve care discussion time.  I spent 30 minutes of neurocritical care time  in the care of  this patient.      Ardella Beaver, MD Medical Director [facility] Pager: [phone] 10/05/2023 3:42 PM   To contact Stroke Continuity provider, please refer to [email or web address]. After hours, contact General Neurology

## 2023-10-06 ENCOUNTER — Inpatient Hospital Stay (HOSPITAL_COMMUNITY)

## 2023-10-06 DIAGNOSIS — I610 Nontraumatic intracerebral hemorrhage in hemisphere, subcortical: Secondary | ICD-10-CM | POA: Diagnosis not present

## 2023-10-06 DIAGNOSIS — I629 Nontraumatic intracranial hemorrhage, unspecified: Secondary | ICD-10-CM | POA: Diagnosis not present

## 2023-10-06 DIAGNOSIS — I161 Hypertensive emergency: Secondary | ICD-10-CM | POA: Diagnosis not present

## 2023-10-06 DIAGNOSIS — I639 Cerebral infarction, unspecified: Secondary | ICD-10-CM

## 2023-10-06 DIAGNOSIS — R29700 NIHSS score 0: Secondary | ICD-10-CM | POA: Diagnosis not present

## 2023-10-06 DIAGNOSIS — I619 Nontraumatic intracerebral hemorrhage, unspecified: Secondary | ICD-10-CM | POA: Diagnosis not present

## 2023-10-06 LAB — GLUCOSE, CAPILLARY
Glucose-Capillary: 109 mg/dL — ABNORMAL HIGH (ref 70–99)
Glucose-Capillary: 103 mg/dL — ABNORMAL HIGH (ref 70–99)

## 2023-10-06 MED ORDER — FUROSEMIDE 20 MG PO TABS
20.0000 mg | ORAL_TABLET | Freq: Once | ORAL | Status: AC
  Administered 2023-10-06: 20 mg via ORAL
  Filled 2023-10-06: qty 1

## 2023-10-06 MED ORDER — FUROSEMIDE 40 MG PO TABS
40.0000 mg | ORAL_TABLET | Freq: Every day | ORAL | Status: DC
  Administered 2023-10-07 – 2023-10-08 (×2): 40 mg via ORAL
  Filled 2023-10-06 (×2): qty 1

## 2023-10-06 NOTE — Progress Notes (Signed)
 STROKE TEAM PROGRESS NOTE   INTERIM HISTORY/SUBJECTIVE  Patient is awake and alert in no apparent distress.   Neurological exam is unchanged.  Blood pressure adequately controlled.  She has not required any blood pressure drips overnight.  Patient had popliteal vein DVT in August 2024 and has finished 8 months of anticoagulations and repeat Ludiomil Dopplers negative for DVT so she no longer needs to go back on anticoagulation     She will transfer out of the ICU later today.   medical hospitalist team has assumed care today  OBJECTIVE  CBC    Component Value Date/Time   WBC 5.7 10/04/2023 1450   RBC 5.30 (H) 10/04/2023 1450   HGB 14.3 10/04/2023 1450   HCT 47.4 (H) 10/04/2023 1450   PLT 184 10/04/2023 1450   MCV 89.4 10/04/2023 1450   MCH 27.0 10/04/2023 1450   MCHC 30.2 10/04/2023 1450   RDW 12.7 10/04/2023 1450   LYMPHSABS 1.5 10/04/2023 1450   MONOABS 0.5 10/04/2023 1450   EOSABS 0.2 10/04/2023 1450   BASOSABS 0.1 10/04/2023 1450    BMET    Component Value Date/Time   NA 141 10/04/2023 1450   K 3.8 10/04/2023 1450   CL 104 10/04/2023 1450   CO2 28 10/04/2023 1450   GLUCOSE 81 10/04/2023 1450   BUN 12 10/04/2023 1450   CREATININE 0.50 10/04/2023 1450   CALCIUM  9.5 10/04/2023 1450   GFRNONAA >60 10/04/2023 1450    IMAGING past 24 hours ECHOCARDIOGRAM COMPLETE Result Date: 10/05/2023    ECHOCARDIOGRAM REPORT   Patient Name:   ALEXYIA GUARINO Date of Exam: 10/05/2023 Medical Rec #:  [id number]          Height:       68.5 in Accession #:    [id number]         Weight:       168.0 lb Date of Birth:  08-Jun-1941          BSA:          1.908 m Patient Age:    83 years           BP:           138/73 mmHg Patient Gender: F                  HR:           58 bpm. Exam Location:  Inpatient Procedure: 2D Echo, 3D Echo, Cardiac Doppler, Color Doppler and Strain Analysis            (Both Spectral and Color Flow Doppler were utilized during            procedure). Indications:     Stroke  History:        Patient has no prior history of Echocardiogram examinations.                 Risk Factors:Former Smoker.  Sonographer:    Reta Cassis Referring Phys: [id number] [facility]  Sonographer Comments: Global longitudinal strain was attempted. IMPRESSIONS  1. Left ventricular ejection fraction, by estimation, is 60 to 65%. Left ventricular ejection fraction by 3D volume is 56 %. The left ventricle has normal function. The left ventricle has no regional wall motion abnormalities. Left ventricular diastolic  parameters are consistent with Grade I diastolic dysfunction (impaired relaxation). The average left ventricular global longitudinal strain is -21.0 %. The global longitudinal strain is normal.  2. Right ventricular systolic function is normal. The  right ventricular size is mildly enlarged. There is normal pulmonary artery systolic pressure.  3. Left atrial size was moderately dilated.  4. Right atrial size was mildly dilated.  5. The mitral valve is abnormal. Trivial mitral valve regurgitation. No evidence of mitral stenosis. Moderate to severe mitral annular calcification.  6. The aortic valve is tricuspid. Aortic valve regurgitation is trivial. No aortic stenosis is present.  7. The inferior vena cava is dilated in size with <50% respiratory variability, suggesting right atrial pressure of 15 mmHg. Comparison(s): No prior Echocardiogram. FINDINGS  Left Ventricle: Left ventricular ejection fraction, by estimation, is 60 to 65%. Left ventricular ejection fraction by 3D volume is 56 %. The left ventricle has normal function. The left ventricle has no regional wall motion abnormalities. The average left ventricular global longitudinal strain is -21.0 %. Strain was performed and the global longitudinal strain is normal. The left ventricular internal cavity size was normal in size. There is no left ventricular hypertrophy. Left ventricular diastolic parameters are consistent with Grade I  diastolic dysfunction (impaired relaxation). Normal left ventricular filling pressure. Right Ventricle: The right ventricular size is mildly enlarged. No increase in right ventricular wall thickness. Right ventricular systolic function is normal. There is normal pulmonary artery systolic pressure. The tricuspid regurgitant velocity is 2.26  m/s, and with an assumed right atrial pressure of 15 mmHg, the estimated right ventricular systolic pressure is 35.4 mmHg. Left Atrium: Left atrial size was moderately dilated. Right Atrium: Right atrial size was mildly dilated. Pericardium: There is no evidence of pericardial effusion. Mitral Valve: The mitral valve is abnormal. Moderate to severe mitral annular calcification. Trivial mitral valve regurgitation. No evidence of mitral valve stenosis. Tricuspid Valve: The tricuspid valve is normal in structure. Tricuspid valve regurgitation is trivial. No evidence of tricuspid stenosis. Aortic Valve: The aortic valve is tricuspid. Aortic valve regurgitation is trivial. No aortic stenosis is present. Aortic valve mean gradient measures 4.0 mmHg. Aortic valve peak gradient measures 7.1 mmHg. Aortic valve area, by VTI measures 2.16 cm. Pulmonic Valve: The pulmonic valve was not well visualized. Pulmonic valve regurgitation is not visualized. No evidence of pulmonic stenosis. Aorta: The aortic root is normal in size and structure. Venous: The inferior vena cava is dilated in size with less than 50% respiratory variability, suggesting right atrial pressure of 15 mmHg. IAS/Shunts: No atrial level shunt detected by color flow Doppler. Additional Comments: 3D was performed not requiring image post processing on an independent workstation and was normal.  LEFT VENTRICLE PLAX 2D LVIDd:         4.90 cm         Diastology LVIDs:         3.30 cm         LV e' medial:    6.20 cm/s LV PW:         0.80 cm         LV E/e' medial:  12.9 LV IVS:        1.10 cm         LV e' lateral:   7.51 cm/s  LVOT diam:     2.10 cm         LV E/e' lateral: 10.7 LV SV:         73 LV SV Index:   38              2D Longitudinal LVOT Area:     3.46 cm        Strain  2D Strain GLS   -21.0 %                                Avg:                                 3D Volume EF                                LV 3D EF:    Left                                             ventricul                                             ar                                             ejection                                             fraction                                             by 3D                                             volume is                                             56 %.                                 3D Volume EF:                                3D EF:        56 %                                LV EDV:       125 ml                                LV ESV:       55 ml  LV SV:        71 ml RIGHT VENTRICLE             IVC RV Basal diam:  4.70 cm     IVC diam: 2.40 cm RV S prime:     10.80 cm/s TAPSE (M-mode): 3.1 cm LEFT ATRIUM             Index        RIGHT ATRIUM           Index LA diam:        3.70 cm 1.94 cm/m   RA Area:     21.60 cm LA Vol (A2C):   81.5 ml 42.71 ml/m  RA Volume:   66.40 ml  34.80 ml/m LA Vol (A4C):   78.4 ml 41.09 ml/m LA Biplane Vol: 81.2 ml 42.56 ml/m  AORTIC VALVE AV Area (Vmax):    2.39 cm AV Area (Vmean):   2.25 cm AV Area (VTI):     2.16 cm AV Vmax:           133.00 cm/s AV Vmean:          91.700 cm/s AV VTI:            0.338 m AV Peak Grad:      7.1 mmHg AV Mean Grad:      4.0 mmHg LVOT Vmax:         91.80 cm/s LVOT Vmean:        59.700 cm/s LVOT VTI:          0.211 m LVOT/AV VTI ratio: 0.62  AORTA Ao Root diam: 2.60 cm MITRAL VALVE               TRICUSPID VALVE MV Area (PHT): 3.48 cm    TR Peak grad:   20.4 mmHg MV Decel Time: 218 msec    TR Vmax:        226.00 cm/s MV E velocity: 80.00 cm/s MV A velocity: 82.40 cm/s  SHUNTS MV  E/A ratio:  0.97        Systemic VTI:  0.21 m                            Systemic Diam: 2.10 cm Vishnu Priya Mallipeddi Electronically signed by Lucetta Russel Mallipeddi Signature Date/Time: 10/05/2023/2:46:23 PM    Final     Vitals:   10/06/23 0300 10/06/23 0400 10/06/23 0500 10/06/23 0600  BP: (!) 128/44 (!) 165/56 127/73 (!) 144/62  Pulse: 62 60 (!) 55 (!) 56  Resp: 16 14 17 17   Temp:  99.2 F (37.3 C)    TempSrc:  Oral    SpO2: 92% 93% 91% 93%  Weight:      Height:         PHYSICAL EXAM General:  Alert, well-nourished, well-developed patient in no acute distress Psych:  Mood and affect appropriate for situation CV: Regular rate and rhythm on monitor Respiratory:  Regular, unlabored respirations on room air GI: Abdomen soft and nontender EXT: Right lower leg with redness and swelling, left lower leg with edema   NEURO:  Mental Status: AA&Ox3, patient is able to give clear and coherent history Speech/Language: speech is without dysarthria or aphasia.  Naming, repetition, fluency, and comprehension intact.  Cranial Nerves:  II: PERRL. Visual fields full.  III, IV, VI: EOMI. Eyelids elevate symmetrically.  V: Sensation is intact to light touch and symmetrical to face.  VII: Face is symmetrical resting and smiling VIII: hearing intact to voice. IX, X: Palate elevates symmetrically. Phonation is normal.  WG:[id number] shrug 5/5. XII: tongue is midline without fasciculations. Motor: 5/5 strength to all muscle groups tested.  Some decreased fine motor skills on the right hand Tone: is normal and bulk is normal Sensation- Intact to light touch bilaterally. Extinction absent to light touch to DSS.   Coordination: FTN intact bilaterally, HKS: no ataxia in BLE.No drift.  Gait- deferred  Most Recent NIH 0   ASSESSMENT/PLAN  Ms. [name] is a 83 y.o. female with history of anxiety, arthritis, GERD, osteoporosis, psoriasis, DVT on Eliquis who initially presented to urgent  care today with blisters on her bilateral lower extremities that had started draining. In urgent care she was found to have a blood pressure of 192/84 and then 201/66. Aaron Aas  NIH on Admission 0, ICH score 1  Intracerebral Hemorrhage: Acute nontraumatic left ganglia reversed with Andexxa  Etiology: Likely hypertensive associated with Eliquis use CT head 1.5 cm focus of parenchymal hemorrhage centered in the left basal ganglia involving the anterior limb of the internal capsule and the lentiform nuclei. Mild surrounding edema MRI official read pending MRA official read pending Carotid ultrasound ordered 2D Echo EF 60 to 65%.  LV with grade 1 diastolic dysfunction.  RV mildly enlarged.  Left atrium moderately dilated, right atrium mildly dilated.  MV moderate to severe mitral annular calcification LDL 73 HgbA1c pending VTE prophylaxis -SCDs Eliquis (apixaban) daily prior to admission, now on No antithrombotic  Therapy recommendations:  Pending Disposition: Ending  History DVT Left posterior tibial vein in August 2024 on Eliquis Eliquis currently on hold due to acute ICH Eliquis still needed?  May need to consider IVC filter versus restarting Eliquis  Hypertension Home meds: Lasix  20 mg Stable Blood Pressure Goal: SBP between 130-150 for 24 hours and then less than 160   Hyperlipidemia Home meds: None LDL 73, goal < 70   Tobacco Abuse Former cigarette smoker   Dysphagia Patient has post-stroke dysphagia, SLP consulted    Diet   Diet Heart Room service appropriate? Yes; Fluid consistency: Thin   Advance diet as tolerated  ?  Cellulitis Right lower extremity with edema, calf is painful with small amount of blistering Ultrasound lower extremities yesterday with no evidence of DVT, subcutaneous edema extending knee to ankle bilaterally  Other Stroke Risk Factors    Other Active Problems GERD Anxiety Prozac  restarted  Hospital day # 2   Patient presented with hypertensive  emergency with small left basal ganglia hemorrhage likely due to combination of hypertension and Eliquis usage.  She received Andexxa  for reversal of Eliquis.  She has remained neurologically stable.  MRI scan today shows stable appearance of left basal ganglia hemorrhage with minimum cerebral edema with no intraventricular extension or midline shift.  Continue strict blood pressure control with systolic goal  below 160.  Mobilize out of bed.  Therapy consults.  Will  transfer out of ICU today.  Discussed with Dr. Murray Arnold.  Long discussion with patient and son  at the bedside and answered questions.  Greater than 50% time during this 50-minute visit was spent in counseling and coordination of care about her hypertension and DVT and need for anticoagulation or not in discussion with patient, son and care team and answering questions.     Ardella Beaver, MD Medical Director [facility] Pager: [phone] 10/06/2023 11:08 AM   To contact Stroke Continuity provider, please refer  to [email or web address]. After hours, contact General Neurology

## 2023-10-06 NOTE — Progress Notes (Addendum)
 PROGRESS NOTE  [name]  ZOX:[id number] DOB: 09-06-1940 DOA: 10/04/2023 PCP: Tena Feeling, MD   Brief Narrative: [name] with history of anxiety, arthritis, GERD, osteoporosis, psoriasis, DVT on Eliquis, bilateral chronic lymphedema who presented to an urgent care with complaint of blisters on bilateral lower extremities with draining.  She was found to be severely hypertensive in presentation and was directed to the emergency department.  CT head done in the emergency department showed left basal ganglia intracerebral hemorrhage likely in the setting of severe hypertension, Eliquis use.  Admitted under neurology service.  Currently hemodynamically stable.  Transferred to [facility] service on 4/20.  PT/OT recommending  SNF on discharge.  Assessment & Plan:  Principal Problem:   ICH (intracerebral hemorrhage) (HCC)  Intracerebral hemorrhage: Presented with severe hypertension.  CT head showed 1.5 cm focus of parenchymal hemorrhage centered in the left basal ganglia involving the anterior limb of the internal capsule, lentiform nuclei, mild surrounding edema.  MRI of the brain showed 1-1.4 cm hemorrhage or hemorrhagic infarction in the left basal ganglia. MR angiogram showed stenosis at the left MCA trifurcation affecting the proximal M2 vessels.  Focal stenosis in the right anterior cerebral artery at the genu.  Neurology following. Venous Doppler of lower extremity did not show any DVT.  Carotid Doppler pending. Echo showed EF of 60-65%, grade 1 diastolic dysfunction, moderately enlarged left atrium, trivial mitral valve regurgitation, moderate to severe mitral annular calcification. LDL of 73.  A1c pending.  Anticoagulation on hold.  PT/OT recommending SNF on discharge.  Speech therapy  consulted  History of DVT: Was on Eliquis.  Currently on hold due to ICH.  Her DVT was 8 months, after discussion with neurology, we decided to stop Eliquis permanently    Hypertension: Takes Lasix  20 mg daily at home.Added amlodipine .  Currently blood pressure stable.  Lasix  increased to 40 mg  Hyperlipidemia: Does not take any medications at home.  LDL of 73  Bilateral lower extremity lymphedema: Chronic.  Has purple/pink discoloration on the right lower extremity but does not appear to be infected.  Low suspicion for cellulitis.  Increasing Lasix  20 mg to 40 mg   Patient is a former cigarette smoker           DVT prophylaxis:heparin  injection 5,000 Units Start: 10/05/23 2200 SCD's Start: 10/05/23 0209     Code Status: Full Code  Family Communication: son at bedside  Patient status:Inpatient  Patient is from :home  Anticipated discharge to:SNF  Estimated DC date:1-2 days   Consultants: Neurology  Procedures:None  Antimicrobials:  Anti-infectives (From admission, onward)    None       Subjective: Patient seen and examined at bedside today.  She was comfortably sitting in the chair.  Hemodynamically stable.  Denies new complaints.  Denies any weakness on upper extremities or lower extremities.  Completely  alert and oriented.  Speech clear  Objective: Vitals:   10/06/23 0300 10/06/23 0400 10/06/23 0500 10/06/23 0600  BP: (!) 128/44 (!) 165/56 127/73 (!) 144/62  Pulse: 62 60 (!) 55 (!) 56  Resp: 16 14 17 17   Temp:  99.2 F (37.3 C)    TempSrc:  Oral    SpO2: 92% 93% 91% 93%  Weight:      Height:        Intake/Output Summary (Last 24 hours) at 10/06/2023 0809 Last data filed at 10/06/2023 0600 Gross per 24 hour  Intake 126.73 ml  Output 250 ml  Net -123.27  ml   Filed Weights   10/04/23 1341  Weight: 76.2 kg    Examination:  General exam: Overall comfortable, not in distress, pleasant elderly female HEENT: PERRL Respiratory system:  no wheezes or crackles  Cardiovascular system: S1 & S2 heard, RRR.  Gastrointestinal system: Abdomen is nondistended, soft and nontender. Central nervous system: Alert and  oriented, no focal deficits Extremities: B/L LE lymphedema , no clubbing ,no cyanosis Skin: erythema/ purple discoloration of right LE, no ulcers,no icterus     Data Reviewed: I have personally reviewed following labs and imaging studies  CBC: Recent Labs  Lab 10/04/23 1450  WBC 5.7  NEUTROABS 3.5  HGB 14.3  HCT 47.4*  MCV 89.4  PLT 184   Basic Metabolic Panel: Recent Labs  Lab 10/04/23 1450  NA 141  K 3.8  CL 104  CO2 28  GLUCOSE 81  BUN 12  CREATININE 0.50  CALCIUM  9.5     No results found for this or any previous visit (from the past 240 hours).   Radiology Studies: MR ANGIO HEAD WO CONTRAST Result Date: 10/05/2023 CLINICAL DATA:  Left basal ganglia stroke/hemorrhage EXAM: MRA HEAD WITHOUT CONTRAST TECHNIQUE: Angiographic images of the Circle of Willis were acquired using MRA technique without intravenous contrast. COMPARISON:  MRI same day FINDINGS: Anterior circulation: Both internal carotid arteries are widely patent through the skull base and siphon regions. The anterior and middle cerebral vessels are patent. Early branching of the right middle cerebral temporal branch from the distal ICA, a normal variant. On the left, there is stenosis at the MCA trifurcation affecting the proximal M2 vessels. Distal branches do show flow. Focal stenosis in the right anterior cerebral artery at the genu. No aneurysm or vascular malformation. No sign of any abnormal high flow vessels in the left basal ganglia region. Posterior circulation: Both vertebral arteries are patent to the basilar. The right is dominant. No basilar stenosis. Superior cerebellar and posterior cerebral arteries are patent. Anatomic variants: None other significant. Other: None IMPRESSION: 1. Stenosis at the left MCA trifurcation affecting the proximal M2 vessels. Distal branches do show flow. 2. Focal stenosis in the right anterior cerebral artery at the genu. 3. No sign of any abnormal high flow vessels in the  left basal ganglia region. Electronically Signed   By: Bettylou Brunner M.D.   On: 10/05/2023 16:14   MR BRAIN WO CONTRAST Result Date: 10/05/2023 CLINICAL DATA:  Stroke.  Follow-up. EXAM: MRI HEAD WITHOUT CONTRAST TECHNIQUE: Multiplanar, multiecho pulse sequences of the brain and surrounding structures were obtained without intravenous contrast. COMPARISON:  Head CT yesterday FINDINGS: Brain: No focal abnormality affects the cerebellum. Mild chronic small-vessel change of the pons. Cerebral hemispheres again demonstrate a 1-1.4 cm hemorrhage or hemorrhagic infarction in the left basal ganglia. No evidence of increased bleeding or mass effect. Mild surrounding edema. Moderate chronic small-vessel ischemic changes otherwise affect the white matter. No other focus of old or recent hemorrhage. No evidence of neoplastic mass, hydrocephalus or extra-axial fluid collection. Vascular: Major vessels at the base of the brain show flow. Skull and upper cervical spine: Negative Sinuses/Orbits: Clear/normal Other: None IMPRESSION: 1. 1-1.4 cm hemorrhage or hemorrhagic infarction in the left basal ganglia. No evidence of increased bleeding or mass effect. Mild surrounding edema. 2. Moderate chronic small-vessel ischemic changes of the white matter. Electronically Signed   By: Bettylou Brunner M.D.   On: 10/05/2023 16:11   ECHOCARDIOGRAM COMPLETE Result Date: 10/05/2023    ECHOCARDIOGRAM REPORT   Patient  Name:   Mara D Zajkowski Date of Exam: 10/05/2023 Medical Rec #:  [id number]          Height:       68.5 in Accession #:    [id number]         Weight:       168.0 lb Date of Birth:  25-Dec-1940          BSA:          1.908 m Patient Age:    82 years           BP:           138/73 mmHg Patient Gender: F                  HR:           58 bpm. Exam Location:  Inpatient Procedure: 2D Echo, 3D Echo, Cardiac Doppler, Color Doppler and Strain Analysis            (Both Spectral and Color Flow Doppler were utilized during             procedure). Indications:    Stroke  History:        Patient has no prior history of Echocardiogram examinations.                 Risk Factors:Former Smoker.  Sonographer:    Reta Cassis Referring Phys: [id number] [facility]  Sonographer Comments: Global longitudinal strain was attempted. IMPRESSIONS  1. Left ventricular ejection fraction, by estimation, is 60 to 65%. Left ventricular ejection fraction by 3D volume is 56 %. The left ventricle has normal function. The left ventricle has no regional wall motion abnormalities. Left ventricular diastolic  parameters are consistent with Grade I diastolic dysfunction (impaired relaxation). The average left ventricular global longitudinal strain is -21.0 %. The global longitudinal strain is normal.  2. Right ventricular systolic function is normal. The right ventricular size is mildly enlarged. There is normal pulmonary artery systolic pressure.  3. Left atrial size was moderately dilated.  4. Right atrial size was mildly dilated.  5. The mitral valve is abnormal. Trivial mitral valve regurgitation. No evidence of mitral stenosis. Moderate to severe mitral annular calcification.  6. The aortic valve is tricuspid. Aortic valve regurgitation is trivial. No aortic stenosis is present.  7. The inferior vena cava is dilated in size with <50% respiratory variability, suggesting right atrial pressure of 15 mmHg. Comparison(s): No prior Echocardiogram. FINDINGS  Left Ventricle: Left ventricular ejection fraction, by estimation, is 60 to 65%. Left ventricular ejection fraction by 3D volume is 56 %. The left ventricle has normal function. The left ventricle has no regional wall motion abnormalities. The average left ventricular global longitudinal strain is -21.0 %. Strain was performed and the global longitudinal strain is normal. The left ventricular internal cavity size was normal in size. There is no left ventricular hypertrophy. Left ventricular diastolic parameters are  consistent with Grade I diastolic dysfunction (impaired relaxation). Normal left ventricular filling pressure. Right Ventricle: The right ventricular size is mildly enlarged. No increase in right ventricular wall thickness. Right ventricular systolic function is normal. There is normal pulmonary artery systolic pressure. The tricuspid regurgitant velocity is 2.26  m/s, and with an assumed right atrial pressure of 15 mmHg, the estimated right ventricular systolic pressure is 35.4 mmHg. Left Atrium: Left atrial size was moderately dilated. Right Atrium: Right atrial size was mildly dilated. Pericardium: There is no evidence of pericardial effusion. Mitral  Valve: The mitral valve is abnormal. Moderate to severe mitral annular calcification. Trivial mitral valve regurgitation. No evidence of mitral valve stenosis. Tricuspid Valve: The tricuspid valve is normal in structure. Tricuspid valve regurgitation is trivial. No evidence of tricuspid stenosis. Aortic Valve: The aortic valve is tricuspid. Aortic valve regurgitation is trivial. No aortic stenosis is present. Aortic valve mean gradient measures 4.0 mmHg. Aortic valve peak gradient measures 7.1 mmHg. Aortic valve area, by VTI measures 2.16 cm. Pulmonic Valve: The pulmonic valve was not well visualized. Pulmonic valve regurgitation is not visualized. No evidence of pulmonic stenosis. Aorta: The aortic root is normal in size and structure. Venous: The inferior vena cava is dilated in size with less than 50% respiratory variability, suggesting right atrial pressure of 15 mmHg. IAS/Shunts: No atrial level shunt detected by color flow Doppler. Additional Comments: 3D was performed not requiring image post processing on an independent workstation and was normal.  LEFT VENTRICLE PLAX 2D LVIDd:         4.90 cm         Diastology LVIDs:         3.30 cm         LV e' medial:    6.20 cm/s LV PW:         0.80 cm         LV E/e' medial:  12.9 LV IVS:        1.10 cm         LV e'  lateral:   7.51 cm/s LVOT diam:     2.10 cm         LV E/e' lateral: 10.7 LV SV:         73 LV SV Index:   38              2D Longitudinal LVOT Area:     3.46 cm        Strain                                2D Strain GLS   -21.0 %                                Avg:                                 3D Volume EF                                LV 3D EF:    Left                                             ventricul                                             ar  ejection                                             fraction                                             by 3D                                             volume is                                             56 %.                                 3D Volume EF:                                3D EF:        56 %                                LV EDV:       125 ml                                LV ESV:       55 ml                                LV SV:        71 ml RIGHT VENTRICLE             IVC RV Basal diam:  4.70 cm     IVC diam: 2.40 cm RV S prime:     10.80 cm/s TAPSE (M-mode): 3.1 cm LEFT ATRIUM             Index        RIGHT ATRIUM           Index LA diam:        3.70 cm 1.94 cm/m   RA Area:     21.60 cm LA Vol (A2C):   81.5 ml 42.71 ml/m  RA Volume:   66.40 ml  34.80 ml/m LA Vol (A4C):   78.4 ml 41.09 ml/m LA Biplane Vol: 81.2 ml 42.56 ml/m  AORTIC VALVE AV Area (Vmax):    2.39 cm AV Area (Vmean):   2.25 cm AV Area (VTI):     2.16 cm AV Vmax:           133.00 cm/s AV Vmean:          91.700 cm/s AV VTI:            0.338 m AV Peak Grad:      7.1 mmHg AV  Mean Grad:      4.0 mmHg LVOT Vmax:         91.80 cm/s LVOT Vmean:        59.700 cm/s LVOT VTI:          0.211 m LVOT/AV VTI ratio: 0.62  AORTA Ao Root diam: 2.60 cm MITRAL VALVE               TRICUSPID VALVE MV Area (PHT): 3.48 cm    TR Peak grad:   20.4 mmHg MV Decel Time: 218 msec    TR Vmax:        226.00 cm/s MV E velocity: 80.00 cm/s MV A velocity:  82.40 cm/s  SHUNTS MV E/A ratio:  0.97        Systemic VTI:  0.21 m                            Systemic Diam: 2.10 cm Vishnu Priya Mallipeddi Electronically signed by Lucetta Russel Mallipeddi Signature Date/Time: 10/05/2023/2:46:23 PM    Final    CT HEAD POST STROKE FOLLOWUP/TIMED/STAT READ Result Date: 10/04/2023 CLINICAL DATA:  Code stroke.  Neuro deficit, acute, stroke suspected EXAM: CT HEAD WITHOUT CONTRAST TECHNIQUE: Contiguous axial images were obtained from the base of the skull through the vertex without intravenous contrast. RADIATION DOSE REDUCTION: This exam was performed according to the departmental dose-optimization program which includes automated exposure control, adjustment of the mA and/or kV according to patient size and/or use of iterative reconstruction technique. COMPARISON:  CT head from earlier today. FINDINGS: Brain: Stable intraparenchymal hemorrhage in the left basal ganglia. No progressive mass effect. No midline shift. No evidence of acute large vascular territory infarct. Patchy white matter hypodensities are nonspecific but compatible with chronic microvascular disease. Vascular: No hyperdense vessel.  Calcific atherosclerosis. Skull: No acute fracture. Sinuses/Orbits: No acute finding. IMPRESSION: Stable intraparenchymal hemorrhage in the left basal ganglia. No progressive mass effect. Electronically Signed   By: Stevenson Elbe M.D.   On: 10/04/2023 21:09   VAS US  LOWER EXTREMITY VENOUS (DVT) (ONLY [facility] & [facility]) Result Date: 10/04/2023  Lower Venous DVT Study Patient Name:  ELEONORA PEELER  Date of Exam:   10/04/2023 Medical Rec #: [id number]           Accession #:    [id number] Date of Birth: [birth year]           Patient Gender: F Patient Age:   57 years Exam Location:  [facility] Procedure:      VAS US  LOWER EXTREMITY VENOUS (DVT) Referring Phys: Geralyn Knee [place] --------------------------------------------------------------------------------  Indications: Edema, Pain,  and Erythema.  Risk Factors: Hx of DVT per patient. Anticoagulation: Eliquis. Limitations: Body habitus and poor ultrasound/tissue interface. Comparison Study: Previous exam on 08/17/2022 was negative for DVT Performing Technologist: Arlyce Berger RVT, RDMS  Examination Guidelines: A complete evaluation includes B-mode imaging, spectral Doppler, color Doppler, and power Doppler as needed of all accessible portions of each vessel. Bilateral testing is considered an integral part of a complete examination. Limited examinations for reoccurring indications may be performed as noted. The reflux portion of the exam is performed with the patient in reverse Trendelenburg.  +---------+---------------+---------+-----------+----------+-------------------+ RIGHT    CompressibilityPhasicitySpontaneityPropertiesThrombus Aging      +---------+---------------+---------+-----------+----------+-------------------+ CFV      Full           Yes      Yes                                      +---------+---------------+---------+-----------+----------+-------------------+  SFJ      Full                                                             +---------+---------------+---------+-----------+----------+-------------------+ FV Prox  Full           Yes      Yes                                      +---------+---------------+---------+-----------+----------+-------------------+ FV Mid   Full           Yes      Yes                                      +---------+---------------+---------+-----------+----------+-------------------+ FV DistalFull           Yes      Yes                                      +---------+---------------+---------+-----------+----------+-------------------+ PFV      Full                                                             +---------+---------------+---------+-----------+----------+-------------------+ POP      Full           Yes      Yes                                       +---------+---------------+---------+-----------+----------+-------------------+ PTV      Full                                         Not well visualized +---------+---------------+---------+-----------+----------+-------------------+ PERO     Full                                         Not well visualized +---------+---------------+---------+-----------+----------+-------------------+   +---------+---------------+---------+-----------+----------+--------------+ LEFT     CompressibilityPhasicitySpontaneityPropertiesThrombus Aging +---------+---------------+---------+-----------+----------+--------------+ CFV      Full           Yes      Yes                                 +---------+---------------+---------+-----------+----------+--------------+ SFJ      Full                                                        +---------+---------------+---------+-----------+----------+--------------+  FV Prox  Full           Yes      Yes                                 +---------+---------------+---------+-----------+----------+--------------+ FV Mid   Full           Yes      Yes                                 +---------+---------------+---------+-----------+----------+--------------+ FV DistalFull           Yes      Yes                                 +---------+---------------+---------+-----------+----------+--------------+ PFV      Full                                                        +---------+---------------+---------+-----------+----------+--------------+ POP      Full           Yes      Yes                                 +---------+---------------+---------+-----------+----------+--------------+ PTV      Full                                                        +---------+---------------+---------+-----------+----------+--------------+ PERO     Full                                                         +---------+---------------+---------+-----------+----------+--------------+     Summary: BILATERAL: - No evidence of deep vein thrombosis seen in the lower extremities, bilaterally. -No evidence of popliteal cyst, bilaterally. -Ultrasound characteristics of enlarged lymph nodes are noted in the groin, bilaterally. - Subcutaneous edema extending from knee to ankle, bilaterally.  *See table(s) above for measurements and observations.    Preliminary    CT Head Wo Contrast Result Date: 10/04/2023 CLINICAL DATA:  Headache, leg swelling, hypertension. EXAM: CT HEAD WITHOUT CONTRAST TECHNIQUE: Contiguous axial images were obtained from the base of the skull through the vertex without intravenous contrast. RADIATION DOSE REDUCTION: This exam was performed according to the departmental dose-optimization program which includes automated exposure control, adjustment of the mA and/or kV according to patient size and/or use of iterative reconstruction technique. COMPARISON:  CT head 02/25/2023. FINDINGS: Brain: 1.5 x 1.0 x 1.1 cm focus of hemorrhage centered in the left basal ganglia with involvement of the anterior limb of the internal capsule and lentiform nuclei. Surrounding edema within the left basal ganglia extending into the subinsular region. No significant midline shift. No CT evidence of acute infarct. Nonspecific hypoattenuation in the  periventricular and subcortical white matter favored to reflect chronic microvascular ischemic changes. The basilar cisterns are patent. Ventricles: Prominence of the ventricles suggesting underlying parenchymal volume loss. Vascular: Atherosclerotic calcifications of the carotid siphons. No hyperdense vessel. Skull: No acute or aggressive finding. Orbits: Orbits are symmetric. Sinuses: The visualized paranasal sinuses are clear. Other: Mastoid air cells are clear. IMPRESSION: 1.5 cm focus of parenchymal hemorrhage centered in the left basal ganglia involving the anterior limb of the  internal capsule and the lentiform nuclei. Mild surrounding edema. No midline shift. Recommend short interval follow-up head CT. Chronic microvascular ischemic changes and parenchymal volume loss. These results were called by telephone at the time of interpretation on 10/04/2023 at 3:41 pm to provider Jerilynn Montenegro , who verbally acknowledged these results. Electronically Signed   By: Denny Flack M.D.   On: 10/04/2023 15:50    Scheduled Meds:   stroke: early stages of recovery book   Does not apply Once   acetaminophen   650 mg Oral Once   amLODipine   5 mg Oral Daily   calcium -vitamin D  1 tablet Oral Q breakfast   Chlorhexidine  Gluconate Cloth  6 each Topical Daily   cyanocobalamin   1,000 mcg Oral Daily   FLUoxetine   60 mg Oral Daily   furosemide   20 mg Oral Daily   heparin  injection (subcutaneous)  5,000 Units Subcutaneous Q8H   pantoprazole  (PROTONIX ) IV  40 mg Intravenous QHS   senna-docusate  1 tablet Oral BID   Continuous Infusions:  clevidipine  2 mg/hr (10/05/23 1000)     LOS: 2 days   Leona Rake, MD [facility] P4/20/2025, 8:09 AM

## 2023-10-06 NOTE — Progress Notes (Signed)
 VASCULAR LAB    Carotid duplex has been performed.  See CV proc for preliminary results.   Joaquin Knebel, RVT 10/06/2023, 5:06 PM

## 2023-10-06 NOTE — Progress Notes (Deleted)
 STROKE TEAM PROGRESS NOTE   INTERIM HISTORY/SUBJECTIVE Family at the bedside. No new neurological events overnight.   DVT was diagnosed in 01/2023 and has been on Eliquis. Repeat US  with no DVT, Eliquis does not need to be restarted at this point.   Waiting on bed availability on the floor. Labs and vitals have been stable   OBJECTIVE  CBC    Component Value Date/Time   WBC 5.7 10/04/2023 1450   RBC 5.30 (H) 10/04/2023 1450   HGB 14.3 10/04/2023 1450   HCT 47.4 (H) 10/04/2023 1450   PLT 184 10/04/2023 1450   MCV 89.4 10/04/2023 1450   MCH 27.0 10/04/2023 1450   MCHC 30.2 10/04/2023 1450   RDW 12.7 10/04/2023 1450   LYMPHSABS 1.5 10/04/2023 1450   MONOABS 0.5 10/04/2023 1450   EOSABS 0.2 10/04/2023 1450   BASOSABS 0.1 10/04/2023 1450    BMET    Component Value Date/Time   NA 141 10/04/2023 1450   K 3.8 10/04/2023 1450   CL 104 10/04/2023 1450   CO2 28 10/04/2023 1450   GLUCOSE 81 10/04/2023 1450   BUN 12 10/04/2023 1450   CREATININE 0.50 10/04/2023 1450   CALCIUM  9.5 10/04/2023 1450   GFRNONAA >60 10/04/2023 1450    IMAGING past 24 hours ECHOCARDIOGRAM COMPLETE Result Date: 10/05/2023    ECHOCARDIOGRAM REPORT   Patient Name:   [name] Date of Exam: 10/05/2023 Medical Rec #:  [id number]          Height:       68.5 in Accession #:    [id number]         Weight:       168.0 lb Date of Birth:  Mar 01, 1941          BSA:          1.908 m Patient Age:    82 years           BP:           138/73 mmHg Patient Gender: F                  HR:           58 bpm. Exam Location:  Inpatient Procedure: 2D Echo, 3D Echo, Cardiac Doppler, Color Doppler and Strain Analysis            (Both Spectral and Color Flow Doppler were utilized during            procedure). Indications:    Stroke  History:        Patient has no prior history of Echocardiogram examinations.                 Risk Factors:Former Smoker.  Sonographer:    Reta Cassis Referring Phys: [id number] [facility]  Sonographer  Comments: Global longitudinal strain was attempted. IMPRESSIONS  1. Left ventricular ejection fraction, by estimation, is 60 to 65%. Left ventricular ejection fraction by 3D volume is 56 %. The left ventricle has normal function. The left ventricle has no regional wall motion abnormalities. Left ventricular diastolic  parameters are consistent with Grade I diastolic dysfunction (impaired relaxation). The average left ventricular global longitudinal strain is -21.0 %. The global longitudinal strain is normal.  2. Right ventricular systolic function is normal. The right ventricular size is mildly enlarged. There is normal pulmonary artery systolic pressure.  3. Left atrial size was moderately dilated.  4. Right atrial size was mildly dilated.  5. The mitral valve  is abnormal. Trivial mitral valve regurgitation. No evidence of mitral stenosis. Moderate to severe mitral annular calcification.  6. The aortic valve is tricuspid. Aortic valve regurgitation is trivial. No aortic stenosis is present.  7. The inferior vena cava is dilated in size with <50% respiratory variability, suggesting right atrial pressure of 15 mmHg. Comparison(s): No prior Echocardiogram. FINDINGS  Left Ventricle: Left ventricular ejection fraction, by estimation, is 60 to 65%. Left ventricular ejection fraction by 3D volume is 56 %. The left ventricle has normal function. The left ventricle has no regional wall motion abnormalities. The average left ventricular global longitudinal strain is -21.0 %. Strain was performed and the global longitudinal strain is normal. The left ventricular internal cavity size was normal in size. There is no left ventricular hypertrophy. Left ventricular diastolic parameters are consistent with Grade I diastolic dysfunction (impaired relaxation). Normal left ventricular filling pressure. Right Ventricle: The right ventricular size is mildly enlarged. No increase in right ventricular wall thickness. Right ventricular  systolic function is normal. There is normal pulmonary artery systolic pressure. The tricuspid regurgitant velocity is 2.26  m/s, and with an assumed right atrial pressure of 15 mmHg, the estimated right ventricular systolic pressure is 35.4 mmHg. Left Atrium: Left atrial size was moderately dilated. Right Atrium: Right atrial size was mildly dilated. Pericardium: There is no evidence of pericardial effusion. Mitral Valve: The mitral valve is abnormal. Moderate to severe mitral annular calcification. Trivial mitral valve regurgitation. No evidence of mitral valve stenosis. Tricuspid Valve: The tricuspid valve is normal in structure. Tricuspid valve regurgitation is trivial. No evidence of tricuspid stenosis. Aortic Valve: The aortic valve is tricuspid. Aortic valve regurgitation is trivial. No aortic stenosis is present. Aortic valve mean gradient measures 4.0 mmHg. Aortic valve peak gradient measures 7.1 mmHg. Aortic valve area, by VTI measures 2.16 cm. Pulmonic Valve: The pulmonic valve was not well visualized. Pulmonic valve regurgitation is not visualized. No evidence of pulmonic stenosis. Aorta: The aortic root is normal in size and structure. Venous: The inferior vena cava is dilated in size with less than 50% respiratory variability, suggesting right atrial pressure of 15 mmHg. IAS/Shunts: No atrial level shunt detected by color flow Doppler. Additional Comments: 3D was performed not requiring image post processing on an independent workstation and was normal.  LEFT VENTRICLE PLAX 2D LVIDd:         4.90 cm         Diastology LVIDs:         3.30 cm         LV e' medial:    6.20 cm/s LV PW:         0.80 cm         LV E/e' medial:  12.9 LV IVS:        1.10 cm         LV e' lateral:   7.51 cm/s LVOT diam:     2.10 cm         LV E/e' lateral: 10.7 LV SV:         73 LV SV Index:   38              2D Longitudinal LVOT Area:     3.46 cm        Strain                                2D Strain GLS   -  21.0 %                                 Avg:                                 3D Volume EF                                LV 3D EF:    Left                                             ventricul                                             ar                                             ejection                                             fraction                                             by 3D                                             volume is                                             56 %.                                 3D Volume EF:                                3D EF:        56 %                                LV EDV:       125 ml                                LV ESV:       55 ml  LV SV:        71 ml RIGHT VENTRICLE             IVC RV Basal diam:  4.70 cm     IVC diam: 2.40 cm RV S prime:     10.80 cm/s TAPSE (M-mode): 3.1 cm LEFT ATRIUM             Index        RIGHT ATRIUM           Index LA diam:        3.70 cm 1.94 cm/m   RA Area:     21.60 cm LA Vol (A2C):   81.5 ml 42.71 ml/m  RA Volume:   66.40 ml  34.80 ml/m LA Vol (A4C):   78.4 ml 41.09 ml/m LA Biplane Vol: 81.2 ml 42.56 ml/m  AORTIC VALVE AV Area (Vmax):    2.39 cm AV Area (Vmean):   2.25 cm AV Area (VTI):     2.16 cm AV Vmax:           133.00 cm/s AV Vmean:          91.700 cm/s AV VTI:            0.338 m AV Peak Grad:      7.1 mmHg AV Mean Grad:      4.0 mmHg LVOT Vmax:         91.80 cm/s LVOT Vmean:        59.700 cm/s LVOT VTI:          0.211 m LVOT/AV VTI ratio: 0.62  AORTA Ao Root diam: 2.60 cm MITRAL VALVE               TRICUSPID VALVE MV Area (PHT): 3.48 cm    TR Peak grad:   20.4 mmHg MV Decel Time: 218 msec    TR Vmax:        226.00 cm/s MV E velocity: 80.00 cm/s MV A velocity: 82.40 cm/s  SHUNTS MV E/A ratio:  0.97        Systemic VTI:  0.21 m                            Systemic Diam: 2.10 cm Vishnu Priya Mallipeddi Electronically signed by Lucetta Russel Mallipeddi Signature Date/Time: 10/05/2023/2:46:23 PM    Final      Vitals:   10/06/23 0300 10/06/23 0400 10/06/23 0500 10/06/23 0600  BP: (!) 128/44 (!) 165/56 127/73 (!) 144/62  Pulse: 62 60 (!) 55 (!) 56  Resp: 16 14 17 17   Temp:  99.2 F (37.3 C)    TempSrc:  Oral    SpO2: 92% 93% 91% 93%  Weight:      Height:         PHYSICAL EXAM General:  Alert, well-nourished, well-developed patient in no acute distress Psych:  Mood and affect appropriate for situation CV: Regular rate and rhythm on monitor Respiratory:  Regular, unlabored respirations on room air GI: Abdomen soft and nontender EXT: Right lower leg with redness and swelling, left lower leg with edema   NEURO:  Mental Status: AA&Ox3, patient is able to give clear and coherent history Speech/Language: speech is without dysarthria or aphasia.  Naming, repetition, fluency, and comprehension intact.  Cranial Nerves:  II: PERRL. Visual fields full.  III, IV, VI: EOMI. Eyelids elevate symmetrically.  V: Sensation is intact to light touch and symmetrical to face.  VII: Face is symmetrical resting and smiling VIII: hearing intact to voice. IX, X: Palate elevates symmetrically. Phonation is normal.  GN:FAOZHYQM shrug 5/5. XII: tongue is midline without fasciculations. Motor: 5/5 strength to all muscle groups tested.  Some decreased fine motor skills on the right hand Tone: is normal and bulk is normal Sensation- Intact to light touch bilaterally. Extinction absent to light touch to DSS.   Coordination: FTN intact bilaterally, HKS: no ataxia in BLE.No drift.  Gait- deferred  Most Recent NIH 0   ASSESSMENT/PLAN  Ms. DAELYNN BLOWER is a 83 y.o. female with history of anxiety, arthritis, GERD, osteoporosis, psoriasis, DVT on Eliquis who initially presented to urgent care today with blisters on her bilateral lower extremities that had started draining. In urgent care she was found to have a blood pressure of 192/84 and then 201/66. Aaron Aas  NIH on Admission 0, ICH score 1  Intracerebral  Hemorrhage: Acute nontraumatic left ganglia reversed with Andexxa  Etiology: Likely hypertensive associated with Eliquis use CT head 1.5 cm focus of parenchymal hemorrhage centered in the left basal ganglia involving the anterior limb of the internal capsule and the lentiform nuclei. Mild surrounding edema MRI official read pending MRA official read pending Carotid ultrasound ordered 2D Echo EF 60 to 65%.  LV with grade 1 diastolic dysfunction.  RV mildly enlarged.  Left atrium moderately dilated, right atrium mildly dilated.  MV moderate to severe mitral annular calcification LDL 73 HgbA1c pending VTE prophylaxis -SCDs Eliquis (apixaban) daily prior to admission, now on No antithrombotic  Therapy recommendations:  Pending Disposition: Ending  History DVT Left posterior tibial vein in August 2024 on Eliquis Eliquis currently on hold due to acute ICH US  LE on 4/18 NO DVT.  No need for continued Eliquis   Hypertension Home meds: Lasix  20 mg Stable Blood Pressure Goal: SBP between 130-150 for 24 hours and then less than 160   Hyperlipidemia Home meds: None LDL 73, goal < 70   Tobacco Abuse Former cigarette smoker   Dysphagia Patient has post-stroke dysphagia, SLP consulted    Diet   Diet Heart Room service appropriate? Yes; Fluid consistency: Thin   Advance diet as tolerated  ?  Cellulitis Right lower extremity with edema, calf is painful with small amount of blistering Ultrasound lower extremities yesterday with no evidence of DVT, subcutaneous edema extending knee to ankle bilaterally  Other Stroke Risk Factors    Other Active Problems GERD Anxiety Prozac  restarted  Hospital day # 2   Jonette Nestle DNP, ACNPC-AG  [facility]   To contact Stroke Continuity provider, please refer to [email or web address]. After hours, contact General Neurology

## 2023-10-07 DIAGNOSIS — I610 Nontraumatic intracerebral hemorrhage in hemisphere, subcortical: Principal | ICD-10-CM

## 2023-10-07 DIAGNOSIS — I619 Nontraumatic intracerebral hemorrhage, unspecified: Secondary | ICD-10-CM | POA: Diagnosis not present

## 2023-10-07 DIAGNOSIS — R29700 NIHSS score 0: Secondary | ICD-10-CM | POA: Diagnosis not present

## 2023-10-07 LAB — CBC
HCT: 40.1 % (ref 36.0–46.0)
Hemoglobin: 12.6 g/dL (ref 12.0–15.0)
MCH: 27.6 pg (ref 26.0–34.0)
MCHC: 31.4 g/dL (ref 30.0–36.0)
MCV: 87.9 fL (ref 80.0–100.0)
Platelets: 147 10*3/uL — ABNORMAL LOW (ref 150–400)
RBC: 4.56 MIL/uL (ref 3.87–5.11)
RDW: 12.6 % (ref 11.5–15.5)
WBC: 5.5 10*3/uL (ref 4.0–10.5)
nRBC: 0 % (ref 0.0–0.2)

## 2023-10-07 LAB — GLUCOSE, CAPILLARY
Glucose-Capillary: 83 mg/dL (ref 70–99)
Glucose-Capillary: 112 mg/dL — ABNORMAL HIGH (ref 70–99)

## 2023-10-07 LAB — BASIC METABOLIC PANEL WITH GFR
Anion gap: 8 (ref 5–15)
BUN: 8 mg/dL (ref 8–23)
CO2: 29 mmol/L (ref 22–32)
Calcium: 8.8 mg/dL — ABNORMAL LOW (ref 8.9–10.3)
Chloride: 104 mmol/L (ref 98–111)
Creatinine, Ser: 0.62 mg/dL (ref 0.44–1.00)
GFR, Estimated: >60 mL/min (ref >60)
Glucose, Bld: 92 mg/dL (ref 70–99)
Potassium: 3.3 mmol/L — ABNORMAL LOW (ref 3.5–5.1)
Sodium: 141 mmol/L (ref 135–145)

## 2023-10-07 LAB — HEMOGLOBIN A1C
Hgb A1c MFr Bld: 5.6 % (ref 4.8–5.6)
Mean Plasma Glucose: 114 mg/dL

## 2023-10-07 MED ORDER — POTASSIUM CHLORIDE CRYS ER 20 MEQ PO TBCR
40.0000 meq | EXTENDED_RELEASE_TABLET | Freq: Once | ORAL | Status: AC
  Administered 2023-10-07: 40 meq via ORAL
  Filled 2023-10-07: qty 2

## 2023-10-07 MED ORDER — POLYETHYLENE GLYCOL 3350 17 G PO PACK: 17.0000 g | PACK | Freq: Every day | ORAL | Status: AC | PRN

## 2023-10-07 MED ORDER — LORAZEPAM 0.5 MG PO TABS: 0.5000 mg | ORAL_TABLET | Freq: Every day | ORAL | 0 refills | Status: DC | PRN

## 2023-10-07 MED ORDER — POTASSIUM CHLORIDE CRYS ER 20 MEQ PO TBCR: 20.0000 meq | EXTENDED_RELEASE_TABLET | Freq: Every day | ORAL | Status: DC

## 2023-10-07 MED ORDER — FUROSEMIDE 40 MG PO TABS: 40.0000 mg | ORAL_TABLET | Freq: Every day | ORAL | Status: AC

## 2023-10-07 MED ORDER — AMLODIPINE BESYLATE 5 MG PO TABS: 5.0000 mg | ORAL_TABLET | Freq: Every day | ORAL | Status: DC

## 2023-10-07 NOTE — Progress Notes (Signed)
 STROKE TEAM PROGRESS NOTE   INTERIM HISTORY/SUBJECTIVE  Patient is sitting up in bed and eating breakfast.  She is awake and alert in no apparent distress.   Neurological exam is unchanged.  Blood pressure adequately controlled.   She is awaiting transfer to skilled nursing facility for rehab. OBJECTIVE  CBC    Component Value Date/Time   WBC 5.5 10/07/2023 0606   RBC 4.56 10/07/2023 0606   HGB 12.6 10/07/2023 0606   HCT 40.1 10/07/2023 0606   PLT 147 (L) 10/07/2023 0606   MCV 87.9 10/07/2023 0606   MCH 27.6 10/07/2023 0606   MCHC 31.4 10/07/2023 0606   RDW 12.6 10/07/2023 0606   LYMPHSABS 1.5 10/04/2023 1450   MONOABS 0.5 10/04/2023 1450   EOSABS 0.2 10/04/2023 1450   BASOSABS 0.1 10/04/2023 1450    BMET    Component Value Date/Time   NA 141 10/07/2023 0606   K 3.3 (L) 10/07/2023 0606   CL 104 10/07/2023 0606   CO2 29 10/07/2023 0606   GLUCOSE 92 10/07/2023 0606   BUN 8 10/07/2023 0606   CREATININE 0.62 10/07/2023 0606   CALCIUM  8.8 (L) 10/07/2023 0606   GFRNONAA >60 10/07/2023 0606    IMAGING past 24 hours VAS US  CAROTID Result Date: 10/06/2023 Carotid Arterial Duplex Study Patient Name:  [name]  Date of Exam:   10/06/2023 Medical Rec #: [id number]           Accession #:    [id number] Date of Birth: 1941/05/02           Patient Gender: F Patient Age:   83 years Exam Location:  [facility] Procedure:      VAS US  CAROTID Referring Phys: Jonette Nestle --------------------------------------------------------------------------------  Indications:       CVA and ICH and Hypertensive emergency. Risk Factors:      Past history of smoking. Other Factors:     History of DVT 01/2023, on Eliquis on admission. Limitations        Today's exam was limited due to tortuosity of vessels. Comparison Study:  No prior study on file Performing Technologist: Carleene Chase RVS  Examination Guidelines: A complete evaluation includes B-mode imaging, spectral Doppler, color Doppler,  and power Doppler as needed of all accessible portions of each vessel. Bilateral testing is considered an integral part of a complete examination. Limited examinations for reoccurring indications may be performed as noted.  Right Carotid Findings: +----------+--------+--------+--------+---------------------+------------------+           PSV cm/sEDV cm/sStenosisPlaque Description   Comments           +----------+--------+--------+--------+---------------------+------------------+ CCA Prox  89      12                                   intimal thickening +----------+--------+--------+--------+---------------------+------------------+ CCA Distal86      14                                   intimal thickening +----------+--------+--------+--------+---------------------+------------------+ ICA Prox  108     22      1-39%   heterogenous, focal  and calcific                            +----------+--------+--------+--------+---------------------+------------------+ ICA Mid   138     26                                   tortuous           +----------+--------+--------+--------+---------------------+------------------+ ICA Distal65      13                                   tortuous           +----------+--------+--------+--------+---------------------+------------------+ ECA       112     7               heterogenous         tortuous           +----------+--------+--------+--------+---------------------+------------------+ +----------+--------+-------+--------+-------------------+           PSV cm/sEDV cmsDescribeArm Pressure (mmHG) +----------+--------+-------+--------+-------------------+ [id number]                                        +----------+--------+-------+--------+-------------------+ +---------+--------+---+--------+--+ VertebralPSV cm/s125EDV cm/s16 +---------+--------+---+--------+--+   Left Carotid Findings: +----------+--------+--------+--------+---------------------+------------------+           PSV cm/sEDV cm/sStenosisPlaque Description   Comments           +----------+--------+--------+--------+---------------------+------------------+ CCA Prox  78      12                                   intimal thickening +----------+--------+--------+--------+---------------------+------------------+ CCA Distal97      12                                   intimal thickening +----------+--------+--------+--------+---------------------+------------------+ ICA Prox  82      16      1-39%   irregular and                                                             calcific                                +----------+--------+--------+--------+---------------------+------------------+ ICA Mid   123     24              homogeneous          tortuous           +----------+--------+--------+--------+---------------------+------------------+ ICA Distal157     32                                   tortuous           +----------+--------+--------+--------+---------------------+------------------+ ECA       83  12                                   tortuous           +----------+--------+--------+--------+---------------------+------------------+ +----------+--------+--------+--------+-------------------+           PSV cm/sEDV cm/sDescribeArm Pressure (mmHG) +----------+--------+--------+--------+-------------------+ Subclavian110                                         +----------+--------+--------+--------+-------------------+ +---------+--------+--+--------+--+ VertebralPSV cm/s38EDV cm/s10 +---------+--------+--+--------+--+   Summary: Right Carotid: Velocities in the right ICA are consistent with a 1-39% stenosis. Left Carotid: Velocities in the left ICA are consistent with a 1-39% stenosis. Vertebrals:  Bilateral vertebral arteries  demonstrate antegrade flow. Subclavians: Normal flow hemodynamics were seen in bilateral subclavian              arteries. *See table(s) above for measurements and observations.     Preliminary     Vitals:   10/06/23 2013 10/07/23 0036 10/07/23 0638 10/07/23 0736  BP: (!) 130/52 (!) 162/72 (!) 154/68 126/71  Pulse: 69 67 65 68  Resp: 18 16 16 18   Temp: 98.4 F (36.9 C) 98.6 F (37 C) 98.6 F (37 C) 98.2 F (36.8 C)  TempSrc: Oral Oral Oral Oral  SpO2: 94% 91% 91% 92%  Weight:      Height:         PHYSICAL EXAM General:  Alert, well-nourished, well-developed patient in no acute distress Psych:  Mood and affect appropriate for situation CV: Regular rate and rhythm on monitor Respiratory:  Regular, unlabored respirations on room air GI: Abdomen soft and nontender EXT: Right lower leg with redness and swelling, left lower leg with edema   NEURO:  Mental Status: AA&Ox3, patient is able to give clear and coherent history Speech/Language: speech is without dysarthria or aphasia.  Naming, repetition, fluency, and comprehension intact.  Cranial Nerves:  II: PERRL. Visual fields full.  III, IV, VI: EOMI. Eyelids elevate symmetrically.  V: Sensation is intact to light touch and symmetrical to face.  VII: Face is symmetrical resting and smiling VIII: hearing intact to voice. IX, X: Palate elevates symmetrically. Phonation is normal.  XB:[id number] shrug 5/5. XII: tongue is midline without fasciculations. Motor: 5/5 strength to all muscle groups tested.  Some decreased fine motor skills on the right hand Tone: is normal and bulk is normal Sensation- Intact to light touch bilaterally. Extinction absent to light touch to DSS.   Coordination: FTN intact bilaterally, HKS: no ataxia in BLE.No drift.  Gait- deferred  Most Recent NIH 0   ASSESSMENT/PLAN  Ms. [name] is a 83 y.o. female with history of anxiety, arthritis, GERD, osteoporosis, psoriasis, DVT on Eliquis who  initially presented to urgent care today with blisters on her bilateral lower extremities that had started draining. In urgent care she was found to have a blood pressure of 192/84 and then 201/66. Aaron Aas  NIH on Admission 0, ICH score 1  Intracerebral Hemorrhage: Acute nontraumatic left ganglia reversed with Andexxa  Etiology: Likely hypertensive associated with Eliquis use CT head 1.5 cm focus of parenchymal hemorrhage centered in the left basal ganglia involving the anterior limb of the internal capsule and the lentiform nuclei. Mild surrounding edema MRI official read pending MRA official read pending Carotid ultrasound ordered 2D Echo EF 60 to 65%.  LV  with grade 1 diastolic dysfunction.  RV mildly enlarged.  Left atrium moderately dilated, right atrium mildly dilated.  MV moderate to severe mitral annular calcification LDL 73 HgbA1c 5.6 VTE prophylaxis -SCDs Eliquis (apixaban) daily prior to admission, now on No antithrombotic  Therapy recommendations:  Pending Disposition: Ending  History DVT Left posterior tibial vein in August 2024 on Eliquis Eliquis currently on hold due to acute ICH Eliquis still needed?  May need to consider IVC filter versus restarting Eliquis  Hypertension Home meds: Lasix  20 mg Stable Blood Pressure Goal: SBP between 130-150 for 24 hours and then less than 160   Hyperlipidemia Home meds: None LDL 73, goal < 70   Tobacco Abuse Former cigarette smoker   Dysphagia Patient has post-stroke dysphagia, SLP consulted    Diet   Diet Heart Room service appropriate? Yes; Fluid consistency: Thin   Advance diet as tolerated  ?  Cellulitis Right lower extremity with edema, calf is painful with small amount of blistering Ultrasound lower extremities yesterday with no evidence of DVT, subcutaneous edema extending knee to ankle bilaterally  Other Stroke Risk Factors    Other Active Problems GERD Anxiety Prozac  restarted  Hospital day # 3   Patient  presented with hypertensive emergency with small left basal ganglia hemorrhage likely due to combination of hypertension and Eliquis usage.  She received Andexxa  for reversal of Eliquis.  She has remained neurologically stable.  MRI scan today shows stable appearance of left basal ganglia hemorrhage with minimum cerebral edema with no intraventricular extension or midline shift.  Continue strict blood pressure control with systolic goal  below 160.  Mobilize out of bed.  Continue ongoing therapy consults.  Transfer to skilled nursing facility when bed available..  Discussed with Dr. Murray Arnold.  Stroke team will sign off.  Kindly call for questions       Ardella Beaver, MD Medical Director [facility] Pager: [phone] 10/07/2023 12:08 PM   To contact Stroke Continuity provider, please refer to [email or web address]. After hours, contact General Neurology

## 2023-10-07 NOTE — Progress Notes (Signed)
 PROGRESS NOTE  [name]  JXB:[id number] DOB: 1941/01/05 DOA: 10/04/2023 PCP: Tena Feeling, MD   Brief Narrative: [name] with history of anxiety, arthritis, GERD, osteoporosis, psoriasis, DVT on Eliquis, bilateral chronic lymphedema who presented to an urgent care with complaint of blisters on bilateral lower extremities with draining.  She was found to be severely hypertensive in presentation and was directed to the emergency department.  CT head done in the emergency department showed left basal ganglia intracerebral hemorrhage likely in the setting of severe hypertension, Eliquis use.  Admitted under neurology service.  Currently hemodynamically stable.  Transferred to [facility] service on 4/20.  PT/OT recommending  SNF on discharge.  Medically stable for discharge whenever possible  Assessment & Plan:  Principal Problem:   ICH (intracerebral hemorrhage) (HCC)  Intracerebral hemorrhage: Presented with severe hypertension.  CT head showed 1.5 cm focus of parenchymal hemorrhage centered in the left basal ganglia involving the anterior limb of the internal capsule, lentiform nuclei, mild surrounding edema.  MRI of the brain showed 1-1.4 cm hemorrhage or hemorrhagic infarction in the left basal ganglia. MR angiogram showed stenosis at the left MCA trifurcation affecting the proximal M2 vessels.  Focal stenosis in the right anterior cerebral artery at the genu. Venous Doppler of lower extremity did not show any DVT.  Carotid Doppler did not show any significant obstruction in the carotid arteries Echo showed EF of 60-65%, grade 1 diastolic dysfunction, moderately enlarged left atrium, trivial mitral valve regurgitation, moderate to severe mitral annular calcification. LDL of 73.  A1c of 5.6.   PT/OT recommending SNF on discharge.TOC following.  Stroke was likely secondary to untreated severe hypertension. Stroke workup completed.  History of DVT: Was on Eliquis.   Currently on hold due to ICH.  Her DVT was 8 months, after discussion with neurology, we decided to stop Eliquis permanently   Hypertension: Takes Lasix  20 mg daily at home.Added amlodipine .  Currently blood pressure stable.  Lasix  increased to 40 mg  Hyperlipidemia: Does not take any medications at home.  LDL of 73  Bilateral lower extremity lymphedema: Chronic.  Has purple/pink discoloration on the right lower extremity but does not appear to be infected.  Low suspicion for cellulitis.  Increasing Lasix  20 mg to 40 mg   Hypokalemia: Potassium supplemented  Patient is a former cigarette smoker           DVT prophylaxis:heparin  injection 5,000 Units Start: 10/05/23 2200 SCD's Start: 10/05/23 0209     Code Status: Full Code  Family Communication: son at bedside on 4/20  Patient status:Inpatient  Patient is from :home  Anticipated discharge to:SNF  Estimated DC date: Whenever possible   Consultants: Neurology  Procedures:None  Antimicrobials:  Anti-infectives (From admission, onward)    None       Subjective: Patient seen and examined at bedside today.  Very comfortable.  Lying in bed.  Denies any complaints.  Alert and oriented.  No focal deficits, speech is clear.  Lower extremity edema  much better today  Objective: Vitals:   10/06/23 2013 10/07/23 0036 10/07/23 0638 10/07/23 0736  BP: (!) 130/52 (!) 162/72 (!) 154/68 126/71  Pulse: 69 67 65 68  Resp: 18 16 16 18   Temp: 98.4 F (36.9 C) 98.6 F (37 C) 98.6 F (37 C) 98.2 F (36.8 C)  TempSrc: Oral Oral Oral Oral  SpO2: 94% 91% 91% 92%  Weight:      Height:  Intake/Output Summary (Last 24 hours) at 10/07/2023 1143 Last data filed at 10/06/2023 1200 Gross per 24 hour  Intake --  Output 100 ml  Net -100 ml   Filed Weights   10/04/23 1341  Weight: 76.2 kg    Examination: General exam: Overall comfortable, not in distress, pleasant lady female HEENT: PERRL Respiratory system:  no  wheezes or crackles  Cardiovascular system: S1 & S2 heard, RRR.  Gastrointestinal system: Abdomen is nondistended, soft and nontender. Central nervous system: Alert and oriented Extremities: Trace bilateral lower extremity pitting edema, no clubbing ,no cyanosis Skin: Mild erythema/discoloration of right lower extremity, no ulcers,no icterus     Data Reviewed: I have personally reviewed following labs and imaging studies  CBC: Recent Labs  Lab 10/04/23 1450 10/07/23 0606  WBC 5.7 5.5  NEUTROABS 3.5  --   HGB 14.3 12.6  HCT 47.4* 40.1  MCV 89.4 87.9  PLT 184 147*   Basic Metabolic Panel: Recent Labs  Lab 10/04/23 1450 10/07/23 0606  NA 141 141  K 3.8 3.3*  CL 104 104  CO2 28 29  GLUCOSE 81 92  BUN 12 8  CREATININE 0.50 0.62  CALCIUM  9.5 8.8*     No results found for this or any previous visit (from the past 240 hours).   Radiology Studies: VAS US  CAROTID Result Date: 10/06/2023 Carotid Arterial Duplex Study Patient Name:  [name]  Date of Exam:   10/06/2023 Medical Rec #: [id number]           Accession #:    [id number] Date of Birth: [birth year]           Patient Gender: F Patient Age:   29 years Exam Location:  [facility] Procedure:      VAS US  CAROTID Referring Phys: Jonette Nestle --------------------------------------------------------------------------------  Indications:       CVA and ICH and Hypertensive emergency. Risk Factors:      Past history of smoking. Other Factors:     History of DVT 01/2023, on Eliquis on admission. Limitations        Today's exam was limited due to tortuosity of vessels. Comparison Study:  No prior study on file Performing Technologist: Carleene Chase RVS  Examination Guidelines: A complete evaluation includes B-mode imaging, spectral Doppler, color Doppler, and power Doppler as needed of all accessible portions of each vessel. Bilateral testing is considered an integral part of a complete examination. Limited examinations for  reoccurring indications may be performed as noted.  Right Carotid Findings: +----------+--------+--------+--------+---------------------+------------------+           PSV cm/sEDV cm/sStenosisPlaque Description   Comments           +----------+--------+--------+--------+---------------------+------------------+ CCA Prox  89      12                                   intimal thickening +----------+--------+--------+--------+---------------------+------------------+ CCA Distal86      14                                   intimal thickening +----------+--------+--------+--------+---------------------+------------------+ ICA Prox  108     22      1-39%   heterogenous, focal  and calcific                            +----------+--------+--------+--------+---------------------+------------------+ ICA Mid   138     26                                   tortuous           +----------+--------+--------+--------+---------------------+------------------+ ICA Distal65      13                                   tortuous           +----------+--------+--------+--------+---------------------+------------------+ ECA       112     7               heterogenous         tortuous           +----------+--------+--------+--------+---------------------+------------------+ +----------+--------+-------+--------+-------------------+           PSV cm/sEDV cmsDescribeArm Pressure (mmHG) +----------+--------+-------+--------+-------------------+ [id number]                                        +----------+--------+-------+--------+-------------------+ +---------+--------+---+--------+--+ VertebralPSV cm/s125EDV cm/s16 +---------+--------+---+--------+--+  Left Carotid Findings: +----------+--------+--------+--------+---------------------+------------------+           PSV cm/sEDV cm/sStenosisPlaque Description    Comments           +----------+--------+--------+--------+---------------------+------------------+ CCA Prox  78      12                                   intimal thickening +----------+--------+--------+--------+---------------------+------------------+ CCA Distal97      12                                   intimal thickening +----------+--------+--------+--------+---------------------+------------------+ ICA Prox  82      16      1-39%   irregular and                                                             calcific                                +----------+--------+--------+--------+---------------------+------------------+ ICA Mid   123     24              homogeneous          tortuous           +----------+--------+--------+--------+---------------------+------------------+ ICA Distal157     32                                   tortuous           +----------+--------+--------+--------+---------------------+------------------+ ECA       83  12                                   tortuous           +----------+--------+--------+--------+---------------------+------------------+ +----------+--------+--------+--------+-------------------+           PSV cm/sEDV cm/sDescribeArm Pressure (mmHG) +----------+--------+--------+--------+-------------------+ Subclavian110                                         +----------+--------+--------+--------+-------------------+ +---------+--------+--+--------+--+ VertebralPSV cm/s38EDV cm/s10 +---------+--------+--+--------+--+   Summary: Right Carotid: Velocities in the right ICA are consistent with a 1-39% stenosis. Left Carotid: Velocities in the left ICA are consistent with a 1-39% stenosis. Vertebrals:  Bilateral vertebral arteries demonstrate antegrade flow. Subclavians: Normal flow hemodynamics were seen in bilateral subclavian              arteries. *See table(s) above for measurements and  observations.     Preliminary    ECHOCARDIOGRAM COMPLETE Result Date: 10/05/2023    ECHOCARDIOGRAM REPORT   Patient Name:   [name] Date of Exam: 10/05/2023 Medical Rec #:  [id number]          Height:       68.5 in Accession #:    [id number]         Weight:       168.0 lb Date of Birth:  02-Apr-1941          BSA:          1.908 m Patient Age:    82 years           BP:           138/73 mmHg Patient Gender: F                  HR:           58 bpm. Exam Location:  Inpatient Procedure: 2D Echo, 3D Echo, Cardiac Doppler, Color Doppler and Strain Analysis            (Both Spectral and Color Flow Doppler were utilized during            procedure). Indications:    Stroke  History:        Patient has no prior history of Echocardiogram examinations.                 Risk Factors:Former Smoker.  Sonographer:    Reta Cassis Referring Phys: [id number] [facility]  Sonographer Comments: Global longitudinal strain was attempted. IMPRESSIONS  1. Left ventricular ejection fraction, by estimation, is 60 to 65%. Left ventricular ejection fraction by 3D volume is 56 %. The left ventricle has normal function. The left ventricle has no regional wall motion abnormalities. Left ventricular diastolic  parameters are consistent with Grade I diastolic dysfunction (impaired relaxation). The average left ventricular global longitudinal strain is -21.0 %. The global longitudinal strain is normal.  2. Right ventricular systolic function is normal. The right ventricular size is mildly enlarged. There is normal pulmonary artery systolic pressure.  3. Left atrial size was moderately dilated.  4. Right atrial size was mildly dilated.  5. The mitral valve is abnormal. Trivial mitral valve regurgitation. No evidence of mitral stenosis. Moderate to severe mitral annular calcification.  6. The aortic valve is tricuspid. Aortic valve regurgitation is trivial. No aortic stenosis is present.  7. The inferior vena cava is dilated in size with  <50% respiratory variability, suggesting right atrial pressure of 15 mmHg. Comparison(s): No prior Echocardiogram. FINDINGS  Left Ventricle: Left ventricular ejection fraction, by estimation, is 60 to 65%. Left ventricular ejection fraction by 3D volume is 56 %. The left ventricle has normal function. The left ventricle has no regional wall motion abnormalities. The average left ventricular global longitudinal strain is -21.0 %. Strain was performed and the global longitudinal strain is normal. The left ventricular internal cavity size was normal in size. There is no left ventricular hypertrophy. Left ventricular diastolic parameters are consistent with Grade I diastolic dysfunction (impaired relaxation). Normal left ventricular filling pressure. Right Ventricle: The right ventricular size is mildly enlarged. No increase in right ventricular wall thickness. Right ventricular systolic function is normal. There is normal pulmonary artery systolic pressure. The tricuspid regurgitant velocity is 2.26  m/s, and with an assumed right atrial pressure of 15 mmHg, the estimated right ventricular systolic pressure is 35.4 mmHg. Left Atrium: Left atrial size was moderately dilated. Right Atrium: Right atrial size was mildly dilated. Pericardium: There is no evidence of pericardial effusion. Mitral Valve: The mitral valve is abnormal. Moderate to severe mitral annular calcification. Trivial mitral valve regurgitation. No evidence of mitral valve stenosis. Tricuspid Valve: The tricuspid valve is normal in structure. Tricuspid valve regurgitation is trivial. No evidence of tricuspid stenosis. Aortic Valve: The aortic valve is tricuspid. Aortic valve regurgitation is trivial. No aortic stenosis is present. Aortic valve mean gradient measures 4.0 mmHg. Aortic valve peak gradient measures 7.1 mmHg. Aortic valve area, by VTI measures 2.16 cm. Pulmonic Valve: The pulmonic valve was not well visualized. Pulmonic valve regurgitation is  not visualized. No evidence of pulmonic stenosis. Aorta: The aortic root is normal in size and structure. Venous: The inferior vena cava is dilated in size with less than 50% respiratory variability, suggesting right atrial pressure of 15 mmHg. IAS/Shunts: No atrial level shunt detected by color flow Doppler. Additional Comments: 3D was performed not requiring image post processing on an independent workstation and was normal.  LEFT VENTRICLE PLAX 2D LVIDd:         4.90 cm         Diastology LVIDs:         3.30 cm         LV e' medial:    6.20 cm/s LV PW:         0.80 cm         LV E/e' medial:  12.9 LV IVS:        1.10 cm         LV e' lateral:   7.51 cm/s LVOT diam:     2.10 cm         LV E/e' lateral: 10.7 LV SV:         73 LV SV Index:   38              2D Longitudinal LVOT Area:     3.46 cm        Strain                                2D Strain GLS   -21.0 %                                Avg:  3D Volume EF                                LV 3D EF:    Left                                             ventricul                                             ar                                             ejection                                             fraction                                             by 3D                                             volume is                                             56 %.                                 3D Volume EF:                                3D EF:        56 %                                LV EDV:       125 ml                                LV ESV:       55 ml                                LV SV:        71 ml RIGHT VENTRICLE             IVC RV Basal diam:  4.70 cm     IVC diam: 2.40 cm RV S prime:     10.80 cm/s TAPSE (M-mode):  3.1 cm LEFT ATRIUM             Index        RIGHT ATRIUM           Index LA diam:        3.70 cm 1.94 cm/m   RA Area:     21.60 cm LA Vol (A2C):   81.5 ml 42.71 ml/m  RA Volume:   66.40 ml  34.80 ml/m LA  Vol (A4C):   78.4 ml 41.09 ml/m LA Biplane Vol: 81.2 ml 42.56 ml/m  AORTIC VALVE AV Area (Vmax):    2.39 cm AV Area (Vmean):   2.25 cm AV Area (VTI):     2.16 cm AV Vmax:           133.00 cm/s AV Vmean:          91.700 cm/s AV VTI:            0.338 m AV Peak Grad:      7.1 mmHg AV Mean Grad:      4.0 mmHg LVOT Vmax:         91.80 cm/s LVOT Vmean:        59.700 cm/s LVOT VTI:          0.211 m LVOT/AV VTI ratio: 0.62  AORTA Ao Root diam: 2.60 cm MITRAL VALVE               TRICUSPID VALVE MV Area (PHT): 3.48 cm    TR Peak grad:   20.4 mmHg MV Decel Time: 218 msec    TR Vmax:        226.00 cm/s MV E velocity: 80.00 cm/s MV A velocity: 82.40 cm/s  SHUNTS MV E/A ratio:  0.97        Systemic VTI:  0.21 m                            Systemic Diam: 2.10 cm Vishnu Priya Mallipeddi Electronically signed by Lucetta Russel Mallipeddi Signature Date/Time: 10/05/2023/2:46:23 PM    Final     Scheduled Meds:   stroke: early stages of recovery book   Does not apply Once   acetaminophen   650 mg Oral Once   amLODipine   5 mg Oral Daily   calcium -vitamin D  1 tablet Oral Q breakfast   Chlorhexidine  Gluconate Cloth  6 each Topical Daily   cyanocobalamin   1,000 mcg Oral Daily   FLUoxetine   60 mg Oral Daily   furosemide   40 mg Oral Daily   heparin  injection (subcutaneous)  5,000 Units Subcutaneous Q8H   senna-docusate  1 tablet Oral BID   Continuous Infusions:     LOS: 3 days   Leona Rake, MD [facility] P4/21/2025, 11:43 AM

## 2023-10-07 NOTE — NC FL2 (Addendum)
 [place]  MEDICAID FL2 LEVEL OF CARE FORM     IDENTIFICATION  Patient Name: [name] Birthdate: 10/24/1940 Sex: female Admission Date (Current Location): 10/04/2023  [facility] and [place] Number:  Producer, television/film/video and Address:  [facility] [place], [address], [place], [place] [address]      Provider Number: [id number]  Attending Physician Name and Address:  Leona Rake, MD  Relative Name and Phone Number:  Kalese, Ensz [phone]  [phone]    Current Level of Care: Hospital Recommended Level of Care: Skilled Nursing Facility Prior Approval Number:    Date Approved/Denied:   PASRR Number:  [id number]  Discharge Plan: SNF    Current Diagnoses: Patient Active Problem List   Diagnosis Date Noted   ICH (intracerebral hemorrhage) (HCC) 10/04/2023   Cellulitis 08/16/2022   Bilateral lower extremity edema 08/16/2022   GERD (gastroesophageal reflux disease) 08/16/2022   Acute back pain 05/25/2022   Recurrent falls 05/23/2022   Class 1 obesity due to excess calories with body mass index (BMI) of 30.0 to 30.9 in adult 05/23/2022   Advanced nonexudative age-related macular degeneration of right eye with subfoveal involvement 05/04/2021   Early stage nonexudative age-related macular degeneration of left eye 12/01/2020   Nuclear sclerotic cataract of left eye 02/25/2020   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   Exudative age-related macular degeneration of right eye with inactive choroidal neovascularization (HCC) 09/24/2019   Retinal hemorrhage of right eye 09/24/2019   Retinal exudates and deposits 09/24/2019   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   Mastodynia, female 03/19/2016   Cystocele 02/08/2014   Vaginal atrophy 01/28/2014   History of herpes simplex infection 01/28/2014   Knee pain, acute 03/05/2013   Anxiety and depression 02/05/2013   Constipation 02/05/2013   Psoriasis 02/05/2013    Failed total knee arthroplasty (HCC) 01/23/2013   Sciatic pain    Detrusor instability    Uterine prolapse    Atrophic vaginitis    Osteoporosis     Orientation RESPIRATION BLADDER Height & Weight     Self, Time, Situation, Place  Normal Continent Weight: 76.2 kg Height:  5' 8.5" (174 cm)  BEHAVIORAL SYMPTOMS/MOOD NEUROLOGICAL BOWEL NUTRITION STATUS      Continent Diet (Heart Healthy with thin liquids)  AMBULATORY STATUS COMMUNICATION OF NEEDS Skin   Limited Assist Verbally Normal                       Personal Care Assistance Level of Assistance  Bathing, Feeding, Dressing Bathing Assistance: Limited assistance Feeding assistance: Independent Dressing Assistance: Limited assistance     Functional Limitations Info  Sight, Hearing, Speech Sight Info: Impaired Hearing Info: Adequate Speech Info: Adequate    SPECIAL CARE FACTORS FREQUENCY  PT (By licensed PT), OT (By licensed OT)     PT Frequency: 5x/wk OT Frequency: 5x/wk            Contractures Contractures Info: Not present    Additional Factors Info  Code Status, Allergies, Psychotropic Code Status Info: Full Allergies Info: pollen Psychotropic Info: Prozac  60 mg daily         Current Medications (10/07/2023):  This is the current hospital active medication list Current Facility-Administered Medications  Medication Dose Route Frequency Provider Last Rate Last Admin    stroke: early stages of recovery book   Does not apply Once Khaliqdina, Salman, MD       acetaminophen  (TYLENOL ) tablet 650 mg  650 mg Oral Q4H PRN Khaliqdina,  Salman, MD       Or   acetaminophen  (TYLENOL ) 160 MG/5ML solution 650 mg  650 mg Per Tube Q4H PRN Khaliqdina, Salman, MD       Or   acetaminophen  (TYLENOL ) suppository 650 mg  650 mg Rectal Q4H PRN Khaliqdina, Salman, MD       acetaminophen  (TYLENOL ) tablet 650 mg  650 mg Oral Once Jerilynn Montenegro, MD       amLODipine  (NORVASC ) tablet 5 mg  5 mg Oral Daily Sethi, Pramod S,  MD   5 mg at 10/07/23 4098   calcium -vitamin D (OSCAL WITH D) 500-5 MG-MCG per tablet 1 tablet  1 tablet Oral Q breakfast Lindzen, Eric, MD   1 tablet at 10/07/23 1191   Chlorhexidine  Gluconate Cloth 2 % PADS 6 each  6 each Topical Daily Kimberley Penman, MD   6 each at 10/07/23 0957   cyanocobalamin  (VITAMIN B12) tablet 1,000 mcg  1,000 mcg Oral Daily Lindzen, Eric, MD   1,000 mcg at 10/07/23 0955   FLUoxetine  (PROZAC ) capsule 60 mg  60 mg Oral Daily Lindzen, Eric, MD   60 mg at 10/07/23 0955   furosemide  (LASIX ) tablet 40 mg  40 mg Oral Daily Leona Rake, MD   40 mg at 10/07/23 0955   heparin  injection 5,000 Units  5,000 Units Subcutaneous Q8H Sethi, Pramod S, MD   5,000 Units at 10/07/23 4782   hydrALAZINE  (APRESOLINE ) injection 5 mg  5 mg Intravenous Q4H PRN Sethi, Pramod S, MD       senna-docusate (Senokot-S) tablet 1 tablet  1 tablet Oral BID Khaliqdina, Salman, MD   1 tablet at 10/05/23 1039     Discharge Medications: Please see discharge summary for a list of discharge medications.  Relevant Imaging Results:  Relevant Lab Results:   Additional Information SS# [id number]  Jonathan Neighbor, RN

## 2023-10-07 NOTE — Evaluation (Addendum)
 Occupational Therapy Evaluation Patient Details Name: [name] MRN: [id number] DOB: 03/20/1941 Today's Date: 10/07/2023   History of Present Illness   83 yo female presents to [facility] on 4/18 with LE blistering. CTH shows L basal ganglia hemorrhage with some mild edema. PMH - anxiety, neck pain, achilles rupture, TKR x 2, revision TKR, osteoporosis, macular degeneration, chronic lymphedema.     Clinical Impressions PTA pt lives at home with her husband @ a modified independent level using her rollator. Pt demonstrates a decline iin function due to below listed deficits, requiring mod A for ADL and mobility at RW level. Patient will benefit from continued inpatient follow up therapy, <3 hours/day to maximize functional level of independence with goal to return home. Acute OT to follow.      If plan is discharge home, recommend the following:   A lot of help with walking and/or transfers;A lot of help with bathing/dressing/bathroom;Assistance with cooking/housework;Direct supervision/assist for medications management;Direct supervision/assist for financial management;Assist for transportation;Help with stairs or ramp for entrance     Functional Status Assessment   Patient has had a recent decline in their functional status and demonstrates the ability to make significant improvements in function in a reasonable and predictable amount of time.     Equipment Recommendations   None recommended by OT     Recommendations for Other Services         Precautions/Restrictions   Precautions Precautions: Fall Precaution/Restrictions Comments: BLE lymphedema, SBP <160 Restrictions Weight Bearing Restrictions Per Provider Order: No     Mobility Bed Mobility Overal bed mobility: Needs Assistance Bed Mobility: Supine to Sit     Supine to sit: Mod assist     General bed mobility comments: assist for LE progression to EOB, trunk elevation. Cues for sequencing, pt scooting  self to EOB    Transfers Overall transfer level: Needs assistance Equipment used: Rolling walker (2 wheels) Transfers: Sit to/from Stand Sit to Stand: Mod assist           General transfer comment: assist for initial power up and steadying once standing. stand x2, from EOB and from [facility].      Balance Overall balance assessment: Needs assistance, History of Falls Sitting-balance support: No upper extremity supported, Feet supported Sitting balance-Leahy Scale: Fair     Standing balance support: Bilateral upper extremity supported, During functional activity, Reliant on assistive device for balance Standing balance-Leahy Scale: Poor Standing balance comment: reliant on RW                           ADL either performed or assessed with clinical judgement   ADL Overall ADL's : Needs assistance/impaired     Grooming: Set up;Sitting   Upper Body Bathing: Set up;Sitting   Lower Body Bathing: Moderate assistance;Sit to/from stand   Upper Body Dressing : Set up;Sitting   Lower Body Dressing: Moderate assistance;Sit to/from stand   Toilet Transfer: Moderate assistance   Toileting- Clothing Manipulation and Hygiene: Moderate assistance       Functional mobility during ADLs: Moderate assistance       Vision Baseline Vision/History: 1 Wears glasses Additional Comments: no apparent impairment     Perception         Praxis         Pertinent Vitals/Pain Pain Assessment Pain Assessment: Faces Faces Pain Scale: Hurts little more Pain Location: RLE Pain Descriptors / Indicators: Grimacing, Guarding, Sore Pain Intervention(s): Limited activity within patient's tolerance  Extremity/Trunk Assessment Upper Extremity Assessment Upper Extremity Assessment: Generalized weakness   Lower Extremity Assessment Lower Extremity Assessment: Defer to PT evaluation   Cervical / Trunk Assessment Cervical / Trunk Assessment: Normal   Communication  Communication Communication: No apparent difficulties   Cognition Arousal: Alert Behavior During Therapy: WFL for tasks assessed/performed Cognition: No family/caregiver present to determine baseline, Cognition impaired     Awareness: Intellectual awareness intact, Online awareness impaired Memory impairment (select all impairments): Working [employer] fast streamer Attention impairment (select first level of impairment): Selective attention Executive functioning impairment (select all impairments): Reasoning, Problem solving, Organization OT - Cognition Comments: unable ot problem solve how to get out of the bed; continued to roll her RW into the recliner and required VC adn tactile cues to position her RW so she could successfully sit                 Following commands: Impaired Following commands impaired: Only follows one step commands consistently     Cueing  General Comments   Cueing Techniques: Verbal cues;Gestural cues;Tactile cues;Visual cues  BLE - wears TEDs - nsg notified   Exercises     Shoulder Instructions      Home Living Family/patient expects to be discharged to:: Private residence Living Arrangements: Spouse/significant other Available Help at Discharge: Family;Available 24 hours/day Type of Home: House Home Access: Stairs to enter [employer] of Steps: 1 Entrance Stairs-Rails: Right;Left (wroughtiron porch that she uses as rails) Home Layout: Able to live on main level with bedroom/bathroom     Bathroom Shower/Tub: Producer, television/film/video: Handicapped height Bathroom Accessibility: Yes   Home Equipment: Rollator (4 wheels);Rolling Walker (2 wheels);BSC/3in1;Shower seat          Prior Functioning/Environment Prior Level of Function : History of Falls (last six months);Needs assist             Mobility Comments: pt sleeps in lift chair, ambulates with RW for short distances in home. pt's children take her to appointments ADLs  Comments: pt sponge bathes, pt's husband aide through the [place] (M-F, 5 hours/day) cleans house and meal preps    OT Problem List: Decreased strength;Decreased range of motion;Decreased activity tolerance;Impaired balance (sitting and/or standing);Decreased cognition;Decreased safety awareness;Decreased knowledge of use of DME or AE   OT Treatment/Interventions: Self-care/ADL training;Therapeutic exercise;DME and/or AE instruction;Therapeutic activities;Cognitive remediation/compensation;Patient/family education;Balance training      OT Goals(Current goals can be found in the care plan section)   Acute Rehab OT Goals Patient Stated Goal: get better OT Goal Formulation: With patient Time For Goal Achievement: 10/21/23 Potential to Achieve Goals: Good   OT Frequency:  Min 2X/week    Co-evaluation              AM-PAC OT "6 Clicks" Daily Activity     Outcome Measure Help from another person eating meals?: None Help from another person taking care of personal grooming?: A Little Help from another person toileting, which includes using toliet, bedpan, or urinal?: A Lot Help from another person bathing (including washing, rinsing, drying)?: A Lot Help from another person to put on and taking off regular upper body clothing?: A Little Help from another person to put on and taking off regular lower body clothing?: A Lot 6 Click Score: 16   End of Session Equipment Utilized During Treatment: Gait belt;Rolling walker (2 wheels) Nurse Communication: Mobility status  Activity Tolerance: Patient tolerated treatment well Patient left: in chair;with call bell/phone within reach;with chair alarm set  OT  Visit Diagnosis: Unsteadiness on feet (R26.81);Other abnormalities of gait and mobility (R26.89);Other symptoms and signs involving cognitive function;Pain Pain - Right/Left: Right Pain - part of body: Leg                Time: 1205-1230 OT Time Calculation (min): 25 min Charges:  OT  General Charges $OT Visit: 1 Visit OT Evaluation $OT Eval Moderate Complexity: 1 Mod OT Treatments $Self Care/Home Management : 8-22 mins  Milburn Aliment, OT/L   Acute OT Clinical Specialist Acute Rehabilitation Services Pager [phone] Office [phone]   [facility] 10/07/2023, 1:02 PM

## 2023-10-07 NOTE — TOC Initial Note (Addendum)
 Transition of Care [facility]) - Initial/Assessment Note    Patient Details  Name: Brittany POSPISIL MRN: [id number] Date of Birth: 1940-07-23  Transition of [facility]) CM/SW Contact:    Jonathan Neighbor, RN Phone Number: 10/07/2023, 11:45 AM  Clinical Narrative:                  Pt is from home with her spouse. Her spouse is with her most of the time but is not able to physically assist much. She is in agreement with SNF rehab. She asked to be faxed out in the [facility] area. Her first preference is [facility] and her second is [facility]. CM has reached out to [facility] to see if they can offer.  Pt will need insurance auth once bed is obtained.  TOC following.   1426: Ms malesky is approved 4/21-4/23 NRD 4/23 Plan Auth [id number]   Expected Discharge Plan: Skilled Nursing Facility Barriers to Discharge: Continued Medical Work up   Patient Goals and CMS Choice   CMS Medicare.gov Compare Post Acute Care list provided to:: Patient Choice offered to / list presented to : Patient      Expected Discharge Plan and Services In-house Referral: Clinical Social Work   Post Acute Care Choice: Skilled Nursing Facility Living arrangements for the past 2 months: Single Family Home                                      Prior Living Arrangements/Services Living arrangements for the past 2 months: Single Family Home Lives with:: Spouse Patient language and need for interpreter reviewed:: Yes Do you feel safe going back to the place where you live?: Yes          Current home services: DME (walker/ cane) Criminal Activity/Legal Involvement Pertinent to Current Situation/Hospitalization: No - Comment as needed  Activities of Daily Living   ADL Screening (condition at time of admission) Independently performs ADLs?: Yes (appropriate for developmental age) Is the patient deaf or have difficulty hearing?: No Does the patient have difficulty seeing, even when wearing  glasses/contacts?: No Does the patient have difficulty concentrating, remembering, or making decisions?: No  Permission Sought/Granted                  Emotional Assessment Appearance:: Appears stated age Attitude/Demeanor/Rapport: Engaged   Orientation: : Oriented to Self, Oriented to Place, Oriented to  Time, Oriented to Situation   Psych Involvement: No (comment)  Admission diagnosis:  Hypertensive crisis [I16.9] ICH (intracerebral hemorrhage) (HCC) [I61.9] Basal ganglia hemorrhage (HCC) [I61.0] Patient Active Problem List   Diagnosis Date Noted   ICH (intracerebral hemorrhage) (HCC) 10/04/2023   Cellulitis 08/16/2022   Bilateral lower extremity edema 08/16/2022   GERD (gastroesophageal reflux disease) 08/16/2022   Acute back pain 05/25/2022   Recurrent falls 05/23/2022   Class 1 obesity due to excess calories with body mass index (BMI) of 30.0 to 30.9 in adult 05/23/2022   Advanced nonexudative age-related macular degeneration of right eye with subfoveal involvement 05/04/2021   Early stage nonexudative age-related macular degeneration of left eye 12/01/2020   Nuclear sclerotic cataract of left eye 02/25/2020   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   Exudative age-related macular degeneration of right eye with inactive choroidal neovascularization (HCC) 09/24/2019   Retinal hemorrhage of right eye 09/24/2019   Retinal exudates and deposits 09/24/2019   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   Mastodynia, female  03/19/2016   Cystocele 02/08/2014   Vaginal atrophy 01/28/2014   History of herpes simplex infection 01/28/2014   Knee pain, acute 03/05/2013   Anxiety and depression 02/05/2013   Constipation 02/05/2013   Psoriasis 02/05/2013   Failed total knee arthroplasty (HCC) 01/23/2013   Sciatic pain    Detrusor instability    Uterine prolapse    Atrophic vaginitis    Osteoporosis    PCP:  Tena Feeling, MD Pharmacy:   [facility]  [phone] Jonette Nestle, [place] - [address] AT [facility] [address] [place] [address] Phone: [phone] Fax: [phone]     Social Drivers of Health (SDOH) Social History: SDOH Screenings   Food Insecurity: No Food Insecurity (10/05/2023)  Housing: Low Risk  (10/07/2023)  Transportation Needs: No Transportation Needs (10/05/2023)  Utilities: Not At Risk (10/05/2023)  Tobacco Use: Medium Risk (10/04/2023)   SDOH Interventions:     Readmission Risk Interventions     No data to display

## 2023-10-07 NOTE — Discharge Summary (Addendum)
 Physician Discharge Summary  JAELEN GELLERMAN ZOX:[id number] DOB: March 18, 1941 DOA: 10/04/2023  PCP: Tena Feeling, MD  Admit date: 10/04/2023 Discharge date: 10/08/2023  Admitted From: Home Disposition:  SNF  Discharge Condition:Stable CODE STATUS:FULL Diet recommendation: Heart Healthy   Brief/Interim Summary: [name] with history of anxiety, arthritis, GERD, osteoporosis, psoriasis, DVT on Eliquis, bilateral chronic lymphedema who presented to an urgent care with complaint of blisters on bilateral lower extremities with draining.  She was found to be severely hypertensive in presentation and was directed to the emergency department.  CT head done in the emergency department showed left basal ganglia intracerebral hemorrhage likely in the setting of severe hypertension, Eliquis use.  Admitted under neurology service.  Currently hemodynamically stable.  Transferred to [facility] service on 4/20.  PT/OT recommending  SNF on discharge.  Medically stable for discharge   Following problems were addressed during the hospitalization:  Intracerebral hemorrhage: Presented with severe hypertension.  CT head showed 1.5 cm focus of parenchymal hemorrhage centered in the left basal ganglia involving the anterior limb of the internal capsule, lentiform nuclei, mild surrounding edema.  MRI of the brain showed 1-1.4 cm hemorrhage or hemorrhagic infarction in the left basal ganglia. MR angiogram showed stenosis at the left MCA trifurcation affecting the proximal M2 vessels.  Focal stenosis in the right anterior cerebral artery at the genu. Venous Doppler of lower extremity did not show any DVT.  Carotid Doppler did not show any significant obstruction in the carotid arteries Echo showed EF of 60-65%, grade 1 diastolic dysfunction, moderately enlarged left atrium, trivial mitral valve regurgitation, moderate to severe mitral annular calcification. LDL of 73.  A1c of 5.6.   PT/OT recommending SNF  on discharge  Stroke was likely secondary to untreated severe hypertension. Stroke workup completed.   History of DVT: Was on Eliquis.  Her DVT was 8 months, after discussion with neurology, we decided to stop Eliquis permanently    Hypertension: Takes Lasix  20 mg daily at home.Added amlodipine .  Currently blood pressure stable.  Lasix  increased to 40 mg   Hyperlipidemia: Does not take any medications at home.  LDL of 73   Bilateral lower extremity lymphedema: Chronic.  Has purple/pink discoloration on the right lower extremity but does not appear to be infected.  Low suspicion for cellulitis.  Increased Lasix  20 mg to 40 mg    Hypokalemia: Continue potassium supplementation    Discharge Diagnoses:  Principal Problem:   ICH (intracerebral hemorrhage) (HCC) Active Problems:   Basal ganglia hemorrhage [facility])    Discharge Instructions  Discharge Instructions     Diet - low sodium heart healthy   Complete by: As directed    Discharge instructions   Complete by: As directed    1)Please take prescribed medications as instructed 2)Do a BMP test in  a week   Increase activity slowly   Complete by: As directed       Allergies as of 10/08/2023       Reactions   Other Itching, Other (See Comments)   Pollen = Itchy eyes/runny nose        Medication List     STOP taking these medications    Eliquis 5 MG Tabs tablet Generic drug: apixaban   feeding supplement Liqd   lidocaine  5 % Commonly known as: LIDODERM        TAKE these medications    Tylenol  8 Hour Arthritis Pain 650 MG CR tablet Generic drug: acetaminophen  Take 1,300 mg by mouth  every 8 (eight) hours as needed for pain.   acetaminophen  325 MG tablet Commonly known as: TYLENOL  Take 2 tablets (650 mg total) by mouth every 6 (six) hours as needed for mild pain (or Fever >/= 101).   amLODipine  5 MG tablet Commonly known as: NORVASC  Take 1 tablet (5 mg total) by mouth daily.   CALCIUM  + VITAMIN D3  PO Take 1 tablet by mouth daily.   cetirizine 10 MG tablet Commonly known as: ZYRTEC Take 10 mg by mouth daily as needed for allergies.   cyanocobalamin  1000 MCG tablet Take 1 tablet (1,000 mcg total) by mouth daily.   FLUoxetine  20 MG tablet Commonly known as: PROZAC  Take 60 mg by mouth in the morning.   furosemide  40 MG tablet Commonly known as: LASIX  Take 1 tablet (40 mg total) by mouth daily. What changed:  medication strength how much to take when to take this   LORazepam  0.5 MG tablet Commonly known as: ATIVAN  Take 1 tablet (0.5 mg total) by mouth daily as needed for anxiety or sleep. What changed: when to take this   polyethylene glycol 17 g packet Commonly known as: MIRALAX  / GLYCOLAX  Take 17 g by mouth daily as needed. What changed:  when to take this reasons to take this   potassium chloride  SA 20 MEQ tablet Commonly known as: KLOR-CON  M Take 1 tablet (20 mEq total) by mouth daily.   senna-docusate 8.6-50 MG tablet Commonly known as: Senokot-S Take 2 tablets by mouth 2 (two) times daily.   valACYclovir  500 MG tablet Commonly known as: VALTREX  Take 1 tablet (500 mg total) by mouth every other day.        Allergies  Allergen Reactions   Other Itching and Other (See Comments)    Pollen = Itchy eyes/runny nose    Consultations: Neurology   Procedures/Studies: VAS US  CAROTID Result Date: 10/06/2023 Carotid Arterial Duplex Study Patient Name:  NICOLENA SCHURMAN  Date of Exam:   10/06/2023 Medical Rec #: [id number]           Accession #:    [id number] Date of Birth: Mar 16, 1941           Patient Gender: F Patient Age:   55 years Exam Location:  [facility] Procedure:      VAS US  CAROTID Referring Phys: Jonette Nestle --------------------------------------------------------------------------------  Indications:       CVA and ICH and Hypertensive emergency. Risk Factors:      Past history of smoking. Other Factors:     History of DVT 01/2023, on  Eliquis on admission. Limitations        Today's exam was limited due to tortuosity of vessels. Comparison Study:  No prior study on file Performing Technologist: Carleene Chase RVS  Examination Guidelines: A complete evaluation includes B-mode imaging, spectral Doppler, color Doppler, and power Doppler as needed of all accessible portions of each vessel. Bilateral testing is considered an integral part of a complete examination. Limited examinations for reoccurring indications may be performed as noted.  Right Carotid Findings: +----------+--------+--------+--------+---------------------+------------------+           PSV cm/sEDV cm/sStenosisPlaque Description   Comments           +----------+--------+--------+--------+---------------------+------------------+ CCA Prox  89      12                                   intimal thickening +----------+--------+--------+--------+---------------------+------------------+ CCA  Distal86      14                                   intimal thickening +----------+--------+--------+--------+---------------------+------------------+ ICA Prox  108     22      1-39%   heterogenous, focal                                                       and calcific                            +----------+--------+--------+--------+---------------------+------------------+ ICA Mid   138     26                                   tortuous           +----------+--------+--------+--------+---------------------+------------------+ ICA Distal65      13                                   tortuous           +----------+--------+--------+--------+---------------------+------------------+ ECA       112     7               heterogenous         tortuous           +----------+--------+--------+--------+---------------------+------------------+ +----------+--------+-------+--------+-------------------+           PSV cm/sEDV cmsDescribeArm Pressure (mmHG)  +----------+--------+-------+--------+-------------------+ [id number]                                        +----------+--------+-------+--------+-------------------+ +---------+--------+---+--------+--+ VertebralPSV cm/s125EDV cm/s16 +---------+--------+---+--------+--+  Left Carotid Findings: +----------+--------+--------+--------+---------------------+------------------+           PSV cm/sEDV cm/sStenosisPlaque Description   Comments           +----------+--------+--------+--------+---------------------+------------------+ CCA Prox  78      12                                   intimal thickening +----------+--------+--------+--------+---------------------+------------------+ CCA Distal97      12                                   intimal thickening +----------+--------+--------+--------+---------------------+------------------+ ICA Prox  82      16      1-39%   irregular and                                                             calcific                                +----------+--------+--------+--------+---------------------+------------------+  ICA Mid   123     24              homogeneous          tortuous           +----------+--------+--------+--------+---------------------+------------------+ ICA Distal157     32                                   tortuous           +----------+--------+--------+--------+---------------------+------------------+ ECA       83      12                                   tortuous           +----------+--------+--------+--------+---------------------+------------------+ +----------+--------+--------+--------+-------------------+           PSV cm/sEDV cm/sDescribeArm Pressure (mmHG) +----------+--------+--------+--------+-------------------+ Subclavian110                                         +----------+--------+--------+--------+-------------------+ +---------+--------+--+--------+--+  VertebralPSV cm/s38EDV cm/s10 +---------+--------+--+--------+--+   Summary: Right Carotid: Velocities in the right ICA are consistent with a 1-39% stenosis. Left Carotid: Velocities in the left ICA are consistent with a 1-39% stenosis. Vertebrals:  Bilateral vertebral arteries demonstrate antegrade flow. Subclavians: Normal flow hemodynamics were seen in bilateral subclavian              arteries. *See table(s) above for measurements and observations.     Preliminary    VAS US  LOWER EXTREMITY VENOUS (DVT) (ONLY [facility] & [facility]) Result Date: 10/06/2023  Lower Venous DVT Study Patient Name:  JACCI RUBERG  Date of Exam:   10/04/2023 Medical Rec #: [id number]           Accession #:    [id number] Date of Birth: 1941/03/14           Patient Gender: F Patient Age:   52 years Exam Location:  [facility] Procedure:      VAS US  LOWER EXTREMITY VENOUS (DVT) Referring Phys: Geralyn Knee GOLDSTON --------------------------------------------------------------------------------  Indications: Edema, Pain, and Erythema.  Risk Factors: Hx of DVT per patient. Anticoagulation: Eliquis. Limitations: Body habitus and poor ultrasound/tissue interface. Comparison Study: Previous exam on 08/17/2022 was negative for DVT Performing Technologist: Arlyce Berger RVT, RDMS  Examination Guidelines: A complete evaluation includes B-mode imaging, spectral Doppler, color Doppler, and power Doppler as needed of all accessible portions of each vessel. Bilateral testing is considered an integral part of a complete examination. Limited examinations for reoccurring indications may be performed as noted. The reflux portion of the exam is performed with the patient in reverse Trendelenburg.  +---------+---------------+---------+-----------+----------+-------------------+ RIGHT    CompressibilityPhasicitySpontaneityPropertiesThrombus Aging      +---------+---------------+---------+-----------+----------+-------------------+ CFV      Full            Yes      Yes                                      +---------+---------------+---------+-----------+----------+-------------------+ SFJ      Full                                                             +---------+---------------+---------+-----------+----------+-------------------+  FV Prox  Full           Yes      Yes                                      +---------+---------------+---------+-----------+----------+-------------------+ FV Mid   Full           Yes      Yes                                      +---------+---------------+---------+-----------+----------+-------------------+ FV DistalFull           Yes      Yes                                      +---------+---------------+---------+-----------+----------+-------------------+ PFV      Full                                                             +---------+---------------+---------+-----------+----------+-------------------+ POP      Full           Yes      Yes                                      +---------+---------------+---------+-----------+----------+-------------------+ PTV      Full                                         Not well visualized +---------+---------------+---------+-----------+----------+-------------------+ PERO     Full                                         Not well visualized +---------+---------------+---------+-----------+----------+-------------------+   +---------+---------------+---------+-----------+----------+--------------+ LEFT     CompressibilityPhasicitySpontaneityPropertiesThrombus Aging +---------+---------------+---------+-----------+----------+--------------+ CFV      Full           Yes      Yes                                 +---------+---------------+---------+-----------+----------+--------------+ SFJ      Full                                                         +---------+---------------+---------+-----------+----------+--------------+ FV Prox  Full           Yes      Yes                                 +---------+---------------+---------+-----------+----------+--------------+ FV Mid   Full  Yes      Yes                                 +---------+---------------+---------+-----------+----------+--------------+ FV DistalFull           Yes      Yes                                 +---------+---------------+---------+-----------+----------+--------------+ PFV      Full                                                        +---------+---------------+---------+-----------+----------+--------------+ POP      Full           Yes      Yes                                 +---------+---------------+---------+-----------+----------+--------------+ PTV      Full                                                        +---------+---------------+---------+-----------+----------+--------------+ PERO     Full                                                        +---------+---------------+---------+-----------+----------+--------------+     Summary: BILATERAL: - No evidence of deep vein thrombosis seen in the lower extremities, bilaterally. -No evidence of popliteal cyst, bilaterally. -Ultrasound characteristics of enlarged lymph nodes are noted in the groin, bilaterally. - Subcutaneous edema extending from knee to ankle, bilaterally.  *See table(s) above for measurements and observations. Electronically signed by Runell Countryman on 10/06/2023 at 3:41:30 PM.    Final    MR ANGIO HEAD WO CONTRAST Result Date: 10/05/2023 CLINICAL DATA:  Left basal ganglia stroke/hemorrhage EXAM: MRA HEAD WITHOUT CONTRAST TECHNIQUE: Angiographic images of the Circle of Willis were acquired using MRA technique without intravenous contrast. COMPARISON:  MRI same day FINDINGS: Anterior circulation: Both internal carotid arteries are widely patent  through the skull base and siphon regions. The anterior and middle cerebral vessels are patent. Early branching of the right middle cerebral temporal branch from the distal ICA, a normal variant. On the left, there is stenosis at the MCA trifurcation affecting the proximal M2 vessels. Distal branches do show flow. Focal stenosis in the right anterior cerebral artery at the genu. No aneurysm or vascular malformation. No sign of any abnormal high flow vessels in the left basal ganglia region. Posterior circulation: Both vertebral arteries are patent to the basilar. The right is dominant. No basilar stenosis. Superior cerebellar and posterior cerebral arteries are patent. Anatomic variants: None other significant. Other: None IMPRESSION: 1. Stenosis at the left MCA trifurcation affecting the proximal M2 vessels. Distal branches do show flow. 2. Focal stenosis in the right anterior cerebral artery  at the genu. 3. No sign of any abnormal high flow vessels in the left basal ganglia region. Electronically Signed   By: Bettylou Brunner M.D.   On: 10/05/2023 16:14   MR BRAIN WO CONTRAST Result Date: 10/05/2023 CLINICAL DATA:  Stroke.  Follow-up. EXAM: MRI HEAD WITHOUT CONTRAST TECHNIQUE: Multiplanar, multiecho pulse sequences of the brain and surrounding structures were obtained without intravenous contrast. COMPARISON:  Head CT yesterday FINDINGS: Brain: No focal abnormality affects the cerebellum. Mild chronic small-vessel change of the pons. Cerebral hemispheres again demonstrate a 1-1.4 cm hemorrhage or hemorrhagic infarction in the left basal ganglia. No evidence of increased bleeding or mass effect. Mild surrounding edema. Moderate chronic small-vessel ischemic changes otherwise affect the white matter. No other focus of old or recent hemorrhage. No evidence of neoplastic mass, hydrocephalus or extra-axial fluid collection. Vascular: Major vessels at the base of the brain show flow. Skull and upper cervical spine:  Negative Sinuses/Orbits: Clear/normal Other: None IMPRESSION: 1. 1-1.4 cm hemorrhage or hemorrhagic infarction in the left basal ganglia. No evidence of increased bleeding or mass effect. Mild surrounding edema. 2. Moderate chronic small-vessel ischemic changes of the white matter. Electronically Signed   By: Bettylou Brunner M.D.   On: 10/05/2023 16:11   ECHOCARDIOGRAM COMPLETE Result Date: 10/05/2023    ECHOCARDIOGRAM REPORT   Patient Name:   CHRISTEENA KROGH Date of Exam: 10/05/2023 Medical Rec #:  [id number]          Height:       68.5 in Accession #:    [id number]         Weight:       168.0 lb Date of Birth:  10-14-1940          BSA:          1.908 m Patient Age:    82 years           BP:           138/73 mmHg Patient Gender: F                  HR:           58 bpm. Exam Location:  Inpatient Procedure: 2D Echo, 3D Echo, Cardiac Doppler, Color Doppler and Strain Analysis            (Both Spectral and Color Flow Doppler were utilized during            procedure). Indications:    Stroke  History:        Patient has no prior history of Echocardiogram examinations.                 Risk Factors:Former Smoker.  Sonographer:    Reta Cassis Referring Phys: [id number] [facility]  Sonographer Comments: Global longitudinal strain was attempted. IMPRESSIONS  1. Left ventricular ejection fraction, by estimation, is 60 to 65%. Left ventricular ejection fraction by 3D volume is 56 %. The left ventricle has normal function. The left ventricle has no regional wall motion abnormalities. Left ventricular diastolic  parameters are consistent with Grade I diastolic dysfunction (impaired relaxation). The average left ventricular global longitudinal strain is -21.0 %. The global longitudinal strain is normal.  2. Right ventricular systolic function is normal. The right ventricular size is mildly enlarged. There is normal pulmonary artery systolic pressure.  3. Left atrial size was moderately dilated.  4. Right atrial size was  mildly dilated.  5. The mitral valve is abnormal. Trivial mitral valve regurgitation.  No evidence of mitral stenosis. Moderate to severe mitral annular calcification.  6. The aortic valve is tricuspid. Aortic valve regurgitation is trivial. No aortic stenosis is present.  7. The inferior vena cava is dilated in size with <50% respiratory variability, suggesting right atrial pressure of 15 mmHg. Comparison(s): No prior Echocardiogram. FINDINGS  Left Ventricle: Left ventricular ejection fraction, by estimation, is 60 to 65%. Left ventricular ejection fraction by 3D volume is 56 %. The left ventricle has normal function. The left ventricle has no regional wall motion abnormalities. The average left ventricular global longitudinal strain is -21.0 %. Strain was performed and the global longitudinal strain is normal. The left ventricular internal cavity size was normal in size. There is no left ventricular hypertrophy. Left ventricular diastolic parameters are consistent with Grade I diastolic dysfunction (impaired relaxation). Normal left ventricular filling pressure. Right Ventricle: The right ventricular size is mildly enlarged. No increase in right ventricular wall thickness. Right ventricular systolic function is normal. There is normal pulmonary artery systolic pressure. The tricuspid regurgitant velocity is 2.26  m/s, and with an assumed right atrial pressure of 15 mmHg, the estimated right ventricular systolic pressure is 35.4 mmHg. Left Atrium: Left atrial size was moderately dilated. Right Atrium: Right atrial size was mildly dilated. Pericardium: There is no evidence of pericardial effusion. Mitral Valve: The mitral valve is abnormal. Moderate to severe mitral annular calcification. Trivial mitral valve regurgitation. No evidence of mitral valve stenosis. Tricuspid Valve: The tricuspid valve is normal in structure. Tricuspid valve regurgitation is trivial. No evidence of tricuspid stenosis. Aortic Valve: The  aortic valve is tricuspid. Aortic valve regurgitation is trivial. No aortic stenosis is present. Aortic valve mean gradient measures 4.0 mmHg. Aortic valve peak gradient measures 7.1 mmHg. Aortic valve area, by VTI measures 2.16 cm. Pulmonic Valve: The pulmonic valve was not well visualized. Pulmonic valve regurgitation is not visualized. No evidence of pulmonic stenosis. Aorta: The aortic root is normal in size and structure. Venous: The inferior vena cava is dilated in size with less than 50% respiratory variability, suggesting right atrial pressure of 15 mmHg. IAS/Shunts: No atrial level shunt detected by color flow Doppler. Additional Comments: 3D was performed not requiring image post processing on an independent workstation and was normal.  LEFT VENTRICLE PLAX 2D LVIDd:         4.90 cm         Diastology LVIDs:         3.30 cm         LV e' medial:    6.20 cm/s LV PW:         0.80 cm         LV E/e' medial:  12.9 LV IVS:        1.10 cm         LV e' lateral:   7.51 cm/s LVOT diam:     2.10 cm         LV E/e' lateral: 10.7 LV SV:         73 LV SV Index:   38              2D Longitudinal LVOT Area:     3.46 cm        Strain                                2D Strain GLS   -21.0 %  Avg:                                 3D Volume EF                                LV 3D EF:    Left                                             ventricul                                             ar                                             ejection                                             fraction                                             by 3D                                             volume is                                             56 %.                                 3D Volume EF:                                3D EF:        56 %                                LV EDV:       125 ml                                LV ESV:       55 ml                                LV SV:        71 ml RIGHT  VENTRICLE  IVC RV Basal diam:  4.70 cm     IVC diam: 2.40 cm RV S prime:     10.80 cm/s TAPSE (M-mode): 3.1 cm LEFT ATRIUM             Index        RIGHT ATRIUM           Index LA diam:        3.70 cm 1.94 cm/m   RA Area:     21.60 cm LA Vol (A2C):   81.5 ml 42.71 ml/m  RA Volume:   66.40 ml  34.80 ml/m LA Vol (A4C):   78.4 ml 41.09 ml/m LA Biplane Vol: 81.2 ml 42.56 ml/m  AORTIC VALVE AV Area (Vmax):    2.39 cm AV Area (Vmean):   2.25 cm AV Area (VTI):     2.16 cm AV Vmax:           133.00 cm/s AV Vmean:          91.700 cm/s AV VTI:            0.338 m AV Peak Grad:      7.1 mmHg AV Mean Grad:      4.0 mmHg LVOT Vmax:         91.80 cm/s LVOT Vmean:        59.700 cm/s LVOT VTI:          0.211 m LVOT/AV VTI ratio: 0.62  AORTA Ao Root diam: 2.60 cm MITRAL VALVE               TRICUSPID VALVE MV Area (PHT): 3.48 cm    TR Peak grad:   20.4 mmHg MV Decel Time: 218 msec    TR Vmax:        226.00 cm/s MV E velocity: 80.00 cm/s MV A velocity: 82.40 cm/s  SHUNTS MV E/A ratio:  0.97        Systemic VTI:  0.21 m                            Systemic Diam: 2.10 cm Vishnu Priya Mallipeddi Electronically signed by Lucetta Russel Mallipeddi Signature Date/Time: 10/05/2023/2:46:23 PM    Final    CT HEAD POST STROKE FOLLOWUP/TIMED/STAT READ Result Date: 10/04/2023 CLINICAL DATA:  Code stroke.  Neuro deficit, acute, stroke suspected EXAM: CT HEAD WITHOUT CONTRAST TECHNIQUE: Contiguous axial images were obtained from the base of the skull through the vertex without intravenous contrast. RADIATION DOSE REDUCTION: This exam was performed according to the departmental dose-optimization program which includes automated exposure control, adjustment of the mA and/or kV according to patient size and/or use of iterative reconstruction technique. COMPARISON:  CT head from earlier today. FINDINGS: Brain: Stable intraparenchymal hemorrhage in the left basal ganglia. No progressive mass effect. No midline shift. No evidence of  acute large vascular territory infarct. Patchy white matter hypodensities are nonspecific but compatible with chronic microvascular disease. Vascular: No hyperdense vessel.  Calcific atherosclerosis. Skull: No acute fracture. Sinuses/Orbits: No acute finding. IMPRESSION: Stable intraparenchymal hemorrhage in the left basal ganglia. No progressive mass effect. Electronically Signed   By: Stevenson Elbe M.D.   On: 10/04/2023 21:09   CT Head Wo Contrast Result Date: 10/04/2023 CLINICAL DATA:  Headache, leg swelling, hypertension. EXAM: CT HEAD WITHOUT CONTRAST TECHNIQUE: Contiguous axial images were obtained from the base of the skull through the vertex without intravenous contrast. RADIATION DOSE REDUCTION: This exam was performed according  to the departmental dose-optimization program which includes automated exposure control, adjustment of the mA and/or kV according to patient size and/or use of iterative reconstruction technique. COMPARISON:  CT head 02/25/2023. FINDINGS: Brain: 1.5 x 1.0 x 1.1 cm focus of hemorrhage centered in the left basal ganglia with involvement of the anterior limb of the internal capsule and lentiform nuclei. Surrounding edema within the left basal ganglia extending into the subinsular region. No significant midline shift. No CT evidence of acute infarct. Nonspecific hypoattenuation in the periventricular and subcortical white matter favored to reflect chronic microvascular ischemic changes. The basilar cisterns are patent. Ventricles: Prominence of the ventricles suggesting underlying parenchymal volume loss. Vascular: Atherosclerotic calcifications of the carotid siphons. No hyperdense vessel. Skull: No acute or aggressive finding. Orbits: Orbits are symmetric. Sinuses: The visualized paranasal sinuses are clear. Other: Mastoid air cells are clear. IMPRESSION: 1.5 cm focus of parenchymal hemorrhage centered in the left basal ganglia involving the anterior limb of the internal  capsule and the lentiform nuclei. Mild surrounding edema. No midline shift. Recommend short interval follow-up head CT. Chronic microvascular ischemic changes and parenchymal volume loss. These results were called by telephone at the time of interpretation on 10/04/2023 at 3:41 pm to provider Jerilynn Montenegro , who verbally acknowledged these results. Electronically Signed   By: Denny Flack M.D.   On: 10/04/2023 15:50      Subjective: Patient seen and examined at bedside today.  Very comfortable.  Denies any complaints.  Blood pressure stable.  No focal deficits.  Alert and oriented.  Medically stable for discharge to SNF whenever possible  Discharge Exam: Vitals:   10/08/23 0350 10/08/23 0732  BP: 130/79 (!) 155/63  Pulse: 69 72  Resp: 18 18  Temp: 98.3 F (36.8 C) 97.8 F (36.6 C)  SpO2: 90% 94%   Vitals:   10/07/23 2003 10/07/23 2330 10/08/23 0350 10/08/23 0732  BP: (!) 146/62 (!) 141/58 130/79 (!) 155/63  Pulse: 71 77 69 72  Resp: 18 18 18 18   Temp: 98.4 F (36.9 C) 97.9 F (36.6 C) 98.3 F (36.8 C) 97.8 F (36.6 C)  TempSrc: Oral Oral Oral Oral  SpO2: 91% 91% 90% 94%  Weight:      Height:        General: Pt is alert, awake, not in acute distress Cardiovascular: RRR, S1/S2 +, no rubs, no gallops Respiratory: CTA bilaterally, no wheezing, no rhonchi Abdominal: Soft, NT, ND, bowel sounds + Extremities: mild bilateral lower extremity  edema, no cyanosis    The results of significant diagnostics from this hospitalization (including imaging, microbiology, ancillary and laboratory) are listed below for reference.     Microbiology: No results found for this or any previous visit (from the past 240 hours).   Labs: BNP (last 3 results) Recent Labs    10/04/23 1450  BNP 128.8*   Basic Metabolic Panel: Recent Labs  Lab 10/04/23 1450 10/07/23 0606 10/08/23 0540  NA 141 141 136  K 3.8 3.3* 3.6  CL 104 104 103  CO2 28 29 25   GLUCOSE 81 92 92  BUN 12 8 14    CREATININE 0.50 0.62 0.59  CALCIUM  9.5 8.8* 8.5*   Liver Function Tests: Recent Labs  Lab 10/04/23 1450  AST 17  ALT 14  ALKPHOS 111  BILITOT 0.8  PROT 7.7  ALBUMIN 4.2   No results for input(s): "LIPASE", "AMYLASE" in the last 168 hours. No results for input(s): "AMMONIA" in the last 168 hours. CBC: Recent Labs  Lab 10/04/23 1450 10/07/23 0606  WBC 5.7 5.5  NEUTROABS 3.5  --   HGB 14.3 12.6  HCT 47.4* 40.1  MCV 89.4 87.9  PLT 184 147*   Cardiac Enzymes: No results for input(s): "CKTOTAL", "CKMB", "CKMBINDEX", "TROPONINI" in the last 168 hours. BNP: Invalid input(s): "POCBNP" CBG: Recent Labs  Lab 10/06/23 2048 10/06/23 2327 10/07/23 0358 10/07/23 0736  GLUCAP 109* 103* 83 112*   D-Dimer No results for input(s): "DDIMER" in the last 72 hours. Hgb A1c No results for input(s): "HGBA1C" in the last 72 hours.  Lipid Profile No results for input(s): "CHOL", "HDL", "LDLCALC", "TRIG", "CHOLHDL", "LDLDIRECT" in the last 72 hours.  Thyroid  function studies No results for input(s): "TSH", "T4TOTAL", "T3FREE", "THYROIDAB" in the last 72 hours.  Invalid input(s): "FREET3" Anemia work up No results for input(s): "VITAMINB12", "FOLATE", "FERRITIN", "TIBC", "IRON", "RETICCTPCT" in the last 72 hours. Urinalysis    Component Value Date/Time   COLORURINE YELLOW 02/25/2023 0659   APPEARANCEUR HAZY (A) 02/25/2023 0659   LABSPEC 1.020 02/25/2023 0659   PHURINE 5.0 02/25/2023 0659   GLUCOSEU NEGATIVE 02/25/2023 0659   HGBUR SMALL (A) 02/25/2023 0659   BILIRUBINUR NEGATIVE 02/25/2023 0659   KETONESUR NEGATIVE 02/25/2023 0659   PROTEINUR 30 (A) 02/25/2023 0659   UROBILINOGEN 0.2 01/19/2013 1022   NITRITE NEGATIVE 02/25/2023 0659   LEUKOCYTESUR MODERATE (A) 02/25/2023 0659   Sepsis Labs Recent Labs  Lab 10/04/23 1450 10/07/23 0606  WBC 5.7 5.5   Microbiology No results found for this or any previous visit (from the past 240 hours).  Please note: You were  cared for by a hospitalist during your hospital stay. Once you are discharged, your primary care physician will handle any further medical issues. Please note that NO REFILLS for any discharge medications will be authorized once you are discharged, as it is imperative that you return to your primary care physician (or establish a relationship with a primary care physician if you do not have one) for your post hospital discharge needs so that they can reassess your need for medications and monitor your lab values.    Time coordinating discharge: 40 minutes  SIGNED:   Leona Rake, MD  [facility] 10/08/2023, 8:37 AM Pager [phone]  If 7PM-7AM, please contact night-coverage [email or web address] Password [email or web address]

## 2023-10-07 NOTE — Plan of Care (Signed)
  Problem: Education: Goal: Knowledge of disease or condition will improve Outcome: Progressing   Problem: Intracerebral Hemorrhage Tissue Perfusion: Goal: Complications of Intracerebral Hemorrhage will be minimized Outcome: Progressing   Problem: Coping: Goal: Will verbalize positive feelings about self Outcome: Progressing Goal: Will identify appropriate support needs Outcome: Progressing   Problem: Health Behavior/Discharge Planning: Goal: Ability to manage health-related needs will improve Outcome: Progressing Goal: Goals will be collaboratively established with patient/family Outcome: Progressing   Problem: Self-Care: Goal: Ability to participate in self-care as condition permits will improve Outcome: Progressing Goal: Verbalization of feelings and concerns over difficulty with self-care will improve Outcome: Progressing Goal: Ability to communicate needs accurately will improve Outcome: Progressing   Problem: Nutrition: Goal: Risk of aspiration will decrease Outcome: Progressing Goal: Dietary intake will improve Outcome: Progressing   Problem: Education: Goal: Knowledge of General Education information will improve Description: Including pain rating scale, medication(s)/side effects and non-pharmacologic comfort measures Outcome: Progressing   Problem: Health Behavior/Discharge Planning: Goal: Ability to manage health-related needs will improve Outcome: Progressing   Problem: Clinical Measurements: Goal: Ability to maintain clinical measurements within normal limits will improve Outcome: Progressing Goal: Will remain free from infection Outcome: Progressing Goal: Diagnostic test results will improve Outcome: Progressing Goal: Respiratory complications will improve Outcome: Progressing Goal: Cardiovascular complication will be avoided Outcome: Progressing   Problem: Activity: Goal: Risk for activity intolerance will decrease Outcome: Progressing    Problem: Nutrition: Goal: Adequate nutrition will be maintained Outcome: Progressing   Problem: Coping: Goal: Level of anxiety will decrease Outcome: Progressing   Problem: Elimination: Goal: Will not experience complications related to bowel motility Outcome: Progressing Goal: Will not experience complications related to urinary retention Outcome: Progressing   Problem: Pain Managment: Goal: General experience of comfort will improve and/or be controlled Outcome: Progressing   Problem: Safety: Goal: Ability to remain free from injury will improve Outcome: Progressing   Problem: Skin Integrity: Goal: Risk for impaired skin integrity will decrease Outcome: Progressing

## 2023-10-07 NOTE — Care Management Important Message (Signed)
 Important Message  Patient Details  Name: [name] MRN: [id number] Date of Birth: [birth year]   Important Message Given:  Yes - Medicare IM     Wynonia Hedges 10/07/2023, 2:33 PM

## 2023-10-08 DIAGNOSIS — E876 Hypokalemia: Secondary | ICD-10-CM | POA: Diagnosis not present

## 2023-10-08 DIAGNOSIS — S0990XA Unspecified injury of head, initial encounter: Secondary | ICD-10-CM | POA: Diagnosis present

## 2023-10-08 DIAGNOSIS — G4489 Other headache syndrome: Secondary | ICD-10-CM | POA: Diagnosis not present

## 2023-10-08 DIAGNOSIS — M1380 Other specified arthritis, unspecified site: Secondary | ICD-10-CM | POA: Diagnosis not present

## 2023-10-08 DIAGNOSIS — I6782 Cerebral ischemia: Secondary | ICD-10-CM | POA: Diagnosis not present

## 2023-10-08 DIAGNOSIS — R194 Change in bowel habit: Secondary | ICD-10-CM | POA: Diagnosis not present

## 2023-10-08 DIAGNOSIS — R2681 Unsteadiness on feet: Secondary | ICD-10-CM | POA: Diagnosis not present

## 2023-10-08 DIAGNOSIS — B009 Herpesviral infection, unspecified: Secondary | ICD-10-CM | POA: Diagnosis not present

## 2023-10-08 DIAGNOSIS — R2689 Other abnormalities of gait and mobility: Secondary | ICD-10-CM | POA: Diagnosis not present

## 2023-10-08 DIAGNOSIS — F411 Generalized anxiety disorder: Secondary | ICD-10-CM | POA: Diagnosis not present

## 2023-10-08 DIAGNOSIS — Z86718 Personal history of other venous thrombosis and embolism: Secondary | ICD-10-CM | POA: Diagnosis not present

## 2023-10-08 DIAGNOSIS — Z8673 Personal history of transient ischemic attack (TIA), and cerebral infarction without residual deficits: Secondary | ICD-10-CM | POA: Diagnosis not present

## 2023-10-08 DIAGNOSIS — Z7189 Other specified counseling: Secondary | ICD-10-CM | POA: Diagnosis not present

## 2023-10-08 DIAGNOSIS — W19XXXA Unspecified fall, initial encounter: Secondary | ICD-10-CM | POA: Diagnosis not present

## 2023-10-08 DIAGNOSIS — Z79899 Other long term (current) drug therapy: Secondary | ICD-10-CM | POA: Diagnosis not present

## 2023-10-08 DIAGNOSIS — R220 Localized swelling, mass and lump, head: Secondary | ICD-10-CM | POA: Diagnosis not present

## 2023-10-08 DIAGNOSIS — F329 Major depressive disorder, single episode, unspecified: Secondary | ICD-10-CM | POA: Diagnosis not present

## 2023-10-08 DIAGNOSIS — M47812 Spondylosis without myelopathy or radiculopathy, cervical region: Secondary | ICD-10-CM | POA: Diagnosis not present

## 2023-10-08 DIAGNOSIS — I619 Nontraumatic intracerebral hemorrhage, unspecified: Secondary | ICD-10-CM | POA: Diagnosis not present

## 2023-10-08 DIAGNOSIS — W06XXXA Fall from bed, initial encounter: Secondary | ICD-10-CM | POA: Diagnosis not present

## 2023-10-08 DIAGNOSIS — Z7401 Bed confinement status: Secondary | ICD-10-CM | POA: Diagnosis not present

## 2023-10-08 DIAGNOSIS — R609 Edema, unspecified: Secondary | ICD-10-CM | POA: Diagnosis not present

## 2023-10-08 DIAGNOSIS — M810 Age-related osteoporosis without current pathological fracture: Secondary | ICD-10-CM | POA: Diagnosis not present

## 2023-10-08 DIAGNOSIS — R58 Hemorrhage, not elsewhere classified: Secondary | ICD-10-CM | POA: Diagnosis not present

## 2023-10-08 DIAGNOSIS — J439 Emphysema, unspecified: Secondary | ICD-10-CM | POA: Diagnosis not present

## 2023-10-08 DIAGNOSIS — K219 Gastro-esophageal reflux disease without esophagitis: Secondary | ICD-10-CM | POA: Diagnosis not present

## 2023-10-08 DIAGNOSIS — I890 Lymphedema, not elsewhere classified: Secondary | ICD-10-CM | POA: Diagnosis not present

## 2023-10-08 DIAGNOSIS — L400 Psoriasis vulgaris: Secondary | ICD-10-CM | POA: Diagnosis not present

## 2023-10-08 DIAGNOSIS — R531 Weakness: Secondary | ICD-10-CM | POA: Diagnosis not present

## 2023-10-08 DIAGNOSIS — Z7901 Long term (current) use of anticoagulants: Secondary | ICD-10-CM | POA: Diagnosis not present

## 2023-10-08 DIAGNOSIS — M6281 Muscle weakness (generalized): Secondary | ICD-10-CM | POA: Diagnosis not present

## 2023-10-08 DIAGNOSIS — I10 Essential (primary) hypertension: Secondary | ICD-10-CM | POA: Diagnosis not present

## 2023-10-08 DIAGNOSIS — M542 Cervicalgia: Secondary | ICD-10-CM | POA: Diagnosis not present

## 2023-10-08 LAB — BASIC METABOLIC PANEL WITH GFR
Anion gap: 8 (ref 5–15)
BUN: 14 mg/dL (ref 8–23)
CO2: 25 mmol/L (ref 22–32)
Calcium: 8.5 mg/dL — ABNORMAL LOW (ref 8.9–10.3)
Chloride: 103 mmol/L (ref 98–111)
Creatinine, Ser: 0.59 mg/dL (ref 0.44–1.00)
GFR, Estimated: >60 mL/min (ref >60)
Glucose, Bld: 92 mg/dL (ref 70–99)
Potassium: 3.6 mmol/L (ref 3.5–5.1)
Sodium: 136 mmol/L (ref 135–145)

## 2023-10-08 NOTE — Progress Notes (Signed)
 Out with [facility] on stretcher. Belonging with her

## 2023-10-08 NOTE — Progress Notes (Signed)
 Patient seen and examined at the bedside today.  Comfortably lying on bed.  No new complaints.  She has a bed at SNF today.  No new change  in the medical management.  Discharge summary and  orders already placed yesterday.

## 2023-10-08 NOTE — Plan of Care (Signed)

## 2023-10-08 NOTE — Progress Notes (Signed)
 Speech Language Pathology  Patient Details Name: [name] MRN: [id number] DOB: [birth year] Today's Date: 10/08/2023 Time:  -     Pt states she is being transferred to [facility] today. Will defer speech-language-cognitive evaluation to SLP at [facility].    [name]  10/08/2023, 10:08 AM

## 2023-10-08 NOTE — Plan of Care (Signed)
 Talked with her and son about possible discharge to facility. Will inform when case management has information ready. Assisted with bathing and breakfast eaten.     Problem: Education: Goal: Knowledge of disease or condition will improve Outcome: Progressing   Problem: Coping: Goal: Will verbalize positive feelings about self Outcome: Progressing   Problem: Self-Care: Goal: Ability to communicate needs accurately will improve Outcome: Progressing   Problem: Activity: Goal: Risk for activity intolerance will decrease Outcome: Progressing   Problem: Safety: Goal: Ability to remain free from injury will improve Outcome: Progressing   Problem: Skin Integrity: Goal: Risk for impaired skin integrity will decrease Outcome: Progressing

## 2023-10-08 NOTE — TOC Transition Note (Signed)
 Transition of Care [facility]) - Discharge Note   Patient Details  Name: [name] MRN: [id number] Date of Birth: [birth year]  [facility] CM/SW Contact:  Jonathan Neighbor, RN Phone Number: 10/08/2023, 10:49 AM   Clinical Narrative:     Pt is discharging to [facility] today. Room 606. [facility] will provide needed transportation. Son and pt updated.    Number for report: [phone]  Final next level of care: Skilled Nursing Facility Barriers to Discharge: No Barriers Identified   Patient Goals and CMS Choice   CMS Medicare.gov Compare Post Acute Care list provided to:: Patient Choice offered to / list presented to : Patient      Discharge Placement PASRR number recieved: 10/07/23            Patient chooses bed at: [facility] Patient to be transferred to facility by: [facility] Name of family member notified: Thomas--son Patient and family notified of of transfer: 10/08/23  Discharge Plan and Services Additional resources added to the After Visit Summary for   In-house Referral: Clinical Social Work   Post Acute Care Choice: Skilled Nursing Facility                               Social Drivers of Health (SDOH) Interventions SDOH Screenings   Food Insecurity: No Food Insecurity (10/05/2023)  Housing: Low Risk  (10/07/2023)  Transportation Needs: No Transportation Needs (10/05/2023)  Utilities: Not At Risk (10/05/2023)  Tobacco Use: Medium Risk (10/04/2023)     Readmission Risk Interventions     No data to display

## 2023-10-09 DIAGNOSIS — M6281 Muscle weakness (generalized): Secondary | ICD-10-CM | POA: Diagnosis not present

## 2023-10-09 DIAGNOSIS — I619 Nontraumatic intracerebral hemorrhage, unspecified: Secondary | ICD-10-CM | POA: Diagnosis not present

## 2023-10-09 DIAGNOSIS — R609 Edema, unspecified: Secondary | ICD-10-CM | POA: Diagnosis not present

## 2023-10-09 DIAGNOSIS — R2681 Unsteadiness on feet: Secondary | ICD-10-CM | POA: Diagnosis not present

## 2023-10-10 DIAGNOSIS — Z86718 Personal history of other venous thrombosis and embolism: Secondary | ICD-10-CM | POA: Diagnosis not present

## 2023-10-10 DIAGNOSIS — I890 Lymphedema, not elsewhere classified: Secondary | ICD-10-CM | POA: Diagnosis not present

## 2023-10-10 DIAGNOSIS — M810 Age-related osteoporosis without current pathological fracture: Secondary | ICD-10-CM | POA: Diagnosis not present

## 2023-10-10 DIAGNOSIS — F411 Generalized anxiety disorder: Secondary | ICD-10-CM | POA: Diagnosis not present

## 2023-10-10 DIAGNOSIS — F329 Major depressive disorder, single episode, unspecified: Secondary | ICD-10-CM | POA: Diagnosis not present

## 2023-10-10 DIAGNOSIS — I10 Essential (primary) hypertension: Secondary | ICD-10-CM | POA: Diagnosis not present

## 2023-10-10 DIAGNOSIS — L400 Psoriasis vulgaris: Secondary | ICD-10-CM | POA: Diagnosis not present

## 2023-10-10 DIAGNOSIS — K219 Gastro-esophageal reflux disease without esophagitis: Secondary | ICD-10-CM | POA: Diagnosis not present

## 2023-10-10 DIAGNOSIS — M1380 Other specified arthritis, unspecified site: Secondary | ICD-10-CM | POA: Diagnosis not present

## 2023-10-14 DIAGNOSIS — R194 Change in bowel habit: Secondary | ICD-10-CM | POA: Diagnosis not present

## 2023-10-14 DIAGNOSIS — Z79899 Other long term (current) drug therapy: Secondary | ICD-10-CM | POA: Diagnosis not present

## 2023-10-16 DIAGNOSIS — I619 Nontraumatic intracerebral hemorrhage, unspecified: Secondary | ICD-10-CM | POA: Diagnosis not present

## 2023-10-16 DIAGNOSIS — R609 Edema, unspecified: Secondary | ICD-10-CM | POA: Diagnosis not present

## 2023-10-16 DIAGNOSIS — M6281 Muscle weakness (generalized): Secondary | ICD-10-CM | POA: Diagnosis not present

## 2023-10-16 DIAGNOSIS — R2681 Unsteadiness on feet: Secondary | ICD-10-CM | POA: Diagnosis not present

## 2023-10-18 DIAGNOSIS — M6281 Muscle weakness (generalized): Secondary | ICD-10-CM | POA: Diagnosis not present

## 2023-10-18 DIAGNOSIS — I619 Nontraumatic intracerebral hemorrhage, unspecified: Secondary | ICD-10-CM | POA: Diagnosis not present

## 2023-10-18 DIAGNOSIS — R2681 Unsteadiness on feet: Secondary | ICD-10-CM | POA: Diagnosis not present

## 2023-10-18 DIAGNOSIS — R609 Edema, unspecified: Secondary | ICD-10-CM | POA: Diagnosis not present

## 2023-10-21 DIAGNOSIS — Z8673 Personal history of transient ischemic attack (TIA), and cerebral infarction without residual deficits: Secondary | ICD-10-CM | POA: Diagnosis not present

## 2023-10-21 DIAGNOSIS — I10 Essential (primary) hypertension: Secondary | ICD-10-CM | POA: Diagnosis not present

## 2023-10-21 DIAGNOSIS — F329 Major depressive disorder, single episode, unspecified: Secondary | ICD-10-CM | POA: Diagnosis not present

## 2023-10-21 DIAGNOSIS — R609 Edema, unspecified: Secondary | ICD-10-CM | POA: Diagnosis not present

## 2023-10-21 DIAGNOSIS — I619 Nontraumatic intracerebral hemorrhage, unspecified: Secondary | ICD-10-CM | POA: Diagnosis not present

## 2023-10-21 DIAGNOSIS — R194 Change in bowel habit: Secondary | ICD-10-CM | POA: Diagnosis not present

## 2023-10-21 DIAGNOSIS — B009 Herpesviral infection, unspecified: Secondary | ICD-10-CM | POA: Diagnosis not present

## 2023-10-21 DIAGNOSIS — Z86718 Personal history of other venous thrombosis and embolism: Secondary | ICD-10-CM | POA: Diagnosis not present

## 2023-10-21 DIAGNOSIS — M6281 Muscle weakness (generalized): Secondary | ICD-10-CM | POA: Diagnosis not present

## 2023-10-21 DIAGNOSIS — I890 Lymphedema, not elsewhere classified: Secondary | ICD-10-CM | POA: Diagnosis not present

## 2023-10-21 DIAGNOSIS — R2681 Unsteadiness on feet: Secondary | ICD-10-CM | POA: Diagnosis not present

## 2023-10-21 DIAGNOSIS — E876 Hypokalemia: Secondary | ICD-10-CM | POA: Diagnosis not present

## 2023-10-21 DIAGNOSIS — Z7189 Other specified counseling: Secondary | ICD-10-CM | POA: Diagnosis not present

## 2023-10-23 ENCOUNTER — Other Ambulatory Visit: Payer: Self-pay

## 2023-10-23 ENCOUNTER — Emergency Department (HOSPITAL_COMMUNITY)
Admission: EM | Admit: 2023-10-23 | Discharge: 2023-10-23 | Disposition: A | Discharge status: Home / Self Care | Attending: Emergency Medicine | Admitting: Emergency Medicine

## 2023-10-23 ENCOUNTER — Encounter (HOSPITAL_COMMUNITY): Payer: Self-pay

## 2023-10-23 ENCOUNTER — Emergency Department (HOSPITAL_COMMUNITY)

## 2023-10-23 DIAGNOSIS — W19XXXA Unspecified fall, initial encounter: Secondary | ICD-10-CM

## 2023-10-23 DIAGNOSIS — R220 Localized swelling, mass and lump, head: Secondary | ICD-10-CM | POA: Diagnosis not present

## 2023-10-23 DIAGNOSIS — M47812 Spondylosis without myelopathy or radiculopathy, cervical region: Secondary | ICD-10-CM | POA: Diagnosis not present

## 2023-10-23 DIAGNOSIS — S0990XA Unspecified injury of head, initial encounter: Secondary | ICD-10-CM | POA: Diagnosis not present

## 2023-10-23 DIAGNOSIS — W06XXXA Fall from bed, initial encounter: Secondary | ICD-10-CM | POA: Insufficient documentation

## 2023-10-23 DIAGNOSIS — I6782 Cerebral ischemia: Secondary | ICD-10-CM | POA: Diagnosis not present

## 2023-10-23 DIAGNOSIS — M6281 Muscle weakness (generalized): Secondary | ICD-10-CM | POA: Diagnosis not present

## 2023-10-23 DIAGNOSIS — J439 Emphysema, unspecified: Secondary | ICD-10-CM | POA: Diagnosis not present

## 2023-10-23 DIAGNOSIS — Z7901 Long term (current) use of anticoagulants: Secondary | ICD-10-CM | POA: Diagnosis not present

## 2023-10-23 DIAGNOSIS — M542 Cervicalgia: Secondary | ICD-10-CM | POA: Diagnosis not present

## 2023-10-23 DIAGNOSIS — R609 Edema, unspecified: Secondary | ICD-10-CM | POA: Diagnosis not present

## 2023-10-23 DIAGNOSIS — I619 Nontraumatic intracerebral hemorrhage, unspecified: Secondary | ICD-10-CM | POA: Diagnosis not present

## 2023-10-23 DIAGNOSIS — R2681 Unsteadiness on feet: Secondary | ICD-10-CM | POA: Diagnosis not present

## 2023-10-23 NOTE — ED Triage Notes (Addendum)
 Pt presents to ED via EMS from [facility] with a fallout of bed. Hit head, no LOC. No neck or back pain. Alert and oriented x4. Hematoma to right forehead. Pt reporting headache.

## 2023-10-23 NOTE — Discharge Instructions (Signed)
 Your CT results were discussed with you, you were given a copy of your report.  If you experience any worsening symptoms you may return to the emergency department.

## 2023-10-23 NOTE — ED Provider Notes (Signed)
 [place] EMERGENCY DEPARTMENT AT [facility] Provider Note   CSN: [id number] Arrival date & time: 10/23/23  1633     History GERD, DVT (not anticoagulated as of 10/07/2023), Anxiety  Chief Complaint  Patient presents with   Fall    [name] is a 83 y.o. female.  83 year old female with a past medical history of GERD, anxiety presents to the ED with a chief complaint of fall.  Patient reports she was at [employer] in her bed when suddenly she went to reach onto the ground for something and fell forward.  She reports striking the right side of her forehead on the ground.  She did not lose consciousness.  She did receive some Tylenol  x 2 by staff.  She was able to ambulate after the injury occurred.  She is concerned because she was seen here recently for a fall and had a small head bleed.  According to records, during her last fall she was anticoagulated on Eliquis but since then this has been stopped.  She denies any headache, changes in vision, nausea, vomiting.  Denies any other injury or complaints.  The history is provided by the patient.  Fall Pertinent negatives include no chest pain, no abdominal pain, no headaches and no shortness of breath.       Home Medications Prior to Admission medications   Medication Sig Start Date End Date Taking? Authorizing Provider  acetaminophen  (TYLENOL ) 325 MG tablet Take 2 tablets (650 mg total) by mouth every 6 (six) hours as needed for mild pain (or Fever >/= 101). Patient not taking: Reported on 10/04/2023 05/27/22   Maylene Spear, MD  amLODipine  (NORVASC ) 5 MG tablet Take 1 tablet (5 mg total) by mouth daily. 10/08/23   Adhikari, Amrit, MD  Calcium  Carb-Cholecalciferol  (CALCIUM  + VITAMIN D3 PO) Take 1 tablet by mouth daily.    [provider]  cetirizine (ZYRTEC) 10 MG tablet Take 10 mg by mouth daily as needed for allergies.    [provider]  cyanocobalamin  1000 MCG tablet Take 1 tablet (1,000 mcg  total) by mouth daily. 05/30/22   Krishnan, Gokul, MD  FLUoxetine  (PROZAC ) 20 MG tablet Take 60 mg by mouth in the morning.    [provider]  furosemide  (LASIX ) 40 MG tablet Take 1 tablet (40 mg total) by mouth daily. 10/08/23   Leona Rake, MD  LORazepam  (ATIVAN ) 0.5 MG tablet Take 1 tablet (0.5 mg total) by mouth daily as needed for anxiety or sleep. 10/07/23   Adhikari, Amrit, MD  polyethylene glycol (MIRALAX  / GLYCOLAX ) 17 g packet Take 17 g by mouth daily as needed. 10/07/23   Leona Rake, MD  potassium chloride  SA (KLOR-CON  M) 20 MEQ tablet Take 1 tablet (20 mEq total) by mouth daily. 10/07/23   Leona Rake, MD  senna-docusate (SENOKOT-S) 8.6-50 MG tablet Take 2 tablets by mouth 2 (two) times daily. Patient not taking: Reported on 10/04/2023 05/27/22   Maylene Spear, MD  TYLENOL  8 HOUR ARTHRITIS PAIN 650 MG CR tablet Take 1,300 mg by mouth every 8 (eight) hours as needed for pain.    [provider]  valACYclovir  (VALTREX ) 500 MG tablet Take 1 tablet (500 mg total) by mouth every other day. Patient not taking: Reported on 10/04/2023 10/30/22   Andee Bamberger, NP      Allergies    Other    Review of Systems   Review of Systems  Constitutional:  Negative for chills and fever.  Respiratory:  Negative for shortness of breath.   Cardiovascular:  Negative for chest pain.  Gastrointestinal:  Negative for abdominal pain.  Musculoskeletal:  Negative for back pain and neck pain.  Neurological:  Negative for dizziness and headaches.  All other systems reviewed and are negative.   Physical Exam Updated Vital Signs BP 133/66 (BP Location: Left Arm)   Pulse 64   Temp 98.4 F (36.9 C) (Oral)   Resp 16   Ht 5' 8.5" (1.74 m)   Wt 77.1 kg   SpO2 98%   BMI 25.47 kg/m  Physical Exam Vitals and nursing note reviewed.  Constitutional:      Appearance: Normal appearance.  HENT:     Head: Normocephalic.     Comments: Large goose egg to the right forehead.     Nose: Nose normal.     Mouth/Throat:     Mouth: Mucous membranes are moist.     Comments: No visible blood in the oropharynx. Eyes:     Pupils: Pupils are equal, round, and reactive to light.     Comments: Pupils are equal and reactive.  Neck:     Comments: No tenderness with palpation of the cervical spine.  Full range of motion of the neck without pain. Cardiovascular:     Rate and Rhythm: Normal rate.  Pulmonary:     Effort: Pulmonary effort is normal.     Breath sounds: No wheezing.     Comments: Lungs are clear to auscultation. Abdominal:     General: Abdomen is flat.     Palpations: Abdomen is soft.     Tenderness: There is no abdominal tenderness. There is no guarding.  Musculoskeletal:     Cervical back: Normal range of motion and neck supple.  Skin:    General: Skin is warm and dry.     Findings: Erythema present.     Comments: Slight redness to the right forehead.  Neurological:     Mental Status: She is alert and oriented to person, place, and time.     ED Results / Procedures / Treatments   Labs (all labs ordered are listed, but only abnormal results are displayed) Labs Reviewed - No data to display  EKG None  Radiology CT Cervical Spine Wo Contrast Result Date: 10/23/2023 CLINICAL DATA:  Fall from bed with neck pain, initial encounter EXAM: CT CERVICAL SPINE WITHOUT CONTRAST TECHNIQUE: Multidetector CT imaging of the cervical spine was performed without intravenous contrast. Multiplanar CT image reconstructions were also generated. RADIATION DOSE REDUCTION: This exam was performed according to the departmental dose-optimization program which includes automated exposure control, adjustment of the mA and/or kV according to patient size and/or use of iterative reconstruction technique. COMPARISON:  02/25/2023 FINDINGS: Alignment: Loss of normal cervical lordosis is noted this is stable however from the prior exam. Skull base and vertebrae: 7 cervical segments are well  visualized. Vertebral body height is well maintained. Osteophytic change and facet hypertrophic changes are noted. No acute fracture or acute facet abnormality is seen. Soft tissues and spinal canal: Surrounding soft tissue structures show diffuse vascular calcifications. No focal hematoma or other soft tissue abnormality is seen. Upper chest: Emphysematous changes are noted in the lung apices. Other: None IMPRESSION: Multilevel degenerative change without acute abnormality. Electronically Signed   By: Violeta Grey M.D.   On: 10/23/2023 19:17   CT HEAD WO CONTRAST ( ) Result Date: 10/23/2023 CLINICAL DATA:  Head trauma EXAM: CT HEAD WITHOUT CONTRAST TECHNIQUE: Contiguous axial images were obtained from the  base of the skull through the vertex without intravenous contrast. RADIATION DOSE REDUCTION: This exam was performed according to the departmental dose-optimization program which includes automated exposure control, adjustment of the mA and/or kV according to patient size and/or use of iterative reconstruction technique. COMPARISON:  Head CT 10/04/2023 FINDINGS: Brain: No evidence of acute infarction, hemorrhage, hydrocephalus, extra-axial collection or mass lesion/mass effect. There is moderate diffuse atrophy and moderate periventricular white matter hypodensity. Vascular: Atherosclerotic calcifications are present within the cavernous internal carotid arteries. Skull: Normal. Negative for fracture or focal lesion. Sinuses/Orbits: No acute finding. Other: There is scalp soft tissue swelling in the right frontal region. IMPRESSION: 1. No acute intracranial process. 2. Moderate diffuse atrophy and moderate chronic small vessel ischemic changes. 3. Scalp soft tissue swelling in the right frontal region. Electronically Signed   By: Tyron Gallon M.D.   On: 10/23/2023 19:14    Procedures Procedures    Medications Ordered in ED Medications - No data to display  ED Course/ Medical Decision Making/ A&P                                  Medical Decision Making Amount and/or Complexity of Data Reviewed Radiology: ordered.   Patient presents to the ED status post fall, reports she was in her bed, when she bent over in order to reach for an object, she reports falling forward, striking the right side of her head, does have a large goose egg noted to the right side.  She was anticoagulated up until October 07, 2023, however this was discontinued during her last visit according to review of records.  Here patient is neurological exam is intact.  Her vitals are within normal limits.  He is not complaining of any pain, she was given Tylenol  by staff at the facility.  Imaging such as CT head, CT cervical spine were obtained which did not show any acute findings at this time.  I did discuss these results with patient, she was provided with a copy of her report.  No concern for intracranial pathology, no hemorrhage, no other abnormalities noted.  She is agreeable to follow-up with PCP, will alternate Tylenol  versus ibuprofen to help with pain control.  Patient is hemodynamically stable for discharge.    Portions of this note were generated with Scientist, clinical (histocompatibility and immunogenetics). Dictation errors may occur despite best attempts at proofreading.   Final Clinical Impression(s) / ED Diagnoses Final diagnoses:  Fall, initial encounter  Injury of head, initial encounter    Rx / DC Orders ED Discharge Orders     None         Ferguson Gertner, PA-C 10/23/23 1952    Kingsley, Victoria K, DO 10/23/23 2007

## 2023-10-25 DIAGNOSIS — Z86718 Personal history of other venous thrombosis and embolism: Secondary | ICD-10-CM | POA: Diagnosis not present

## 2023-10-25 DIAGNOSIS — R194 Change in bowel habit: Secondary | ICD-10-CM | POA: Diagnosis not present

## 2023-10-25 DIAGNOSIS — F329 Major depressive disorder, single episode, unspecified: Secondary | ICD-10-CM | POA: Diagnosis not present

## 2023-10-25 DIAGNOSIS — I890 Lymphedema, not elsewhere classified: Secondary | ICD-10-CM | POA: Diagnosis not present

## 2023-10-25 DIAGNOSIS — I619 Nontraumatic intracerebral hemorrhage, unspecified: Secondary | ICD-10-CM | POA: Diagnosis not present

## 2023-10-25 DIAGNOSIS — B009 Herpesviral infection, unspecified: Secondary | ICD-10-CM | POA: Diagnosis not present

## 2023-10-25 DIAGNOSIS — I10 Essential (primary) hypertension: Secondary | ICD-10-CM | POA: Diagnosis not present

## 2023-10-25 DIAGNOSIS — Z8673 Personal history of transient ischemic attack (TIA), and cerebral infarction without residual deficits: Secondary | ICD-10-CM | POA: Diagnosis not present

## 2023-10-25 DIAGNOSIS — F411 Generalized anxiety disorder: Secondary | ICD-10-CM | POA: Diagnosis not present

## 2023-10-28 DIAGNOSIS — M810 Age-related osteoporosis without current pathological fracture: Secondary | ICD-10-CM | POA: Diagnosis not present

## 2023-10-28 DIAGNOSIS — I890 Lymphedema, not elsewhere classified: Secondary | ICD-10-CM | POA: Diagnosis not present

## 2023-10-28 DIAGNOSIS — I619 Nontraumatic intracerebral hemorrhage, unspecified: Secondary | ICD-10-CM | POA: Diagnosis not present

## 2023-10-28 DIAGNOSIS — E876 Hypokalemia: Secondary | ICD-10-CM | POA: Diagnosis not present

## 2023-10-28 DIAGNOSIS — F329 Major depressive disorder, single episode, unspecified: Secondary | ICD-10-CM | POA: Diagnosis not present

## 2023-10-28 DIAGNOSIS — F411 Generalized anxiety disorder: Secondary | ICD-10-CM | POA: Diagnosis not present

## 2023-10-28 DIAGNOSIS — K219 Gastro-esophageal reflux disease without esophagitis: Secondary | ICD-10-CM | POA: Diagnosis not present

## 2023-10-28 DIAGNOSIS — I10 Essential (primary) hypertension: Secondary | ICD-10-CM | POA: Diagnosis not present

## 2023-10-28 DIAGNOSIS — M1990 Unspecified osteoarthritis, unspecified site: Secondary | ICD-10-CM | POA: Diagnosis not present

## 2023-11-05 DIAGNOSIS — E876 Hypokalemia: Secondary | ICD-10-CM | POA: Diagnosis not present

## 2023-11-05 DIAGNOSIS — I10 Essential (primary) hypertension: Secondary | ICD-10-CM | POA: Diagnosis not present

## 2023-11-05 DIAGNOSIS — F411 Generalized anxiety disorder: Secondary | ICD-10-CM | POA: Diagnosis not present

## 2023-11-05 DIAGNOSIS — F329 Major depressive disorder, single episode, unspecified: Secondary | ICD-10-CM | POA: Diagnosis not present

## 2023-11-05 DIAGNOSIS — M810 Age-related osteoporosis without current pathological fracture: Secondary | ICD-10-CM | POA: Diagnosis not present

## 2023-11-05 DIAGNOSIS — I890 Lymphedema, not elsewhere classified: Secondary | ICD-10-CM | POA: Diagnosis not present

## 2023-11-05 DIAGNOSIS — K219 Gastro-esophageal reflux disease without esophagitis: Secondary | ICD-10-CM | POA: Diagnosis not present

## 2023-11-05 DIAGNOSIS — M1990 Unspecified osteoarthritis, unspecified site: Secondary | ICD-10-CM | POA: Diagnosis not present

## 2023-11-05 DIAGNOSIS — I619 Nontraumatic intracerebral hemorrhage, unspecified: Secondary | ICD-10-CM | POA: Diagnosis not present

## 2023-11-06 DIAGNOSIS — I619 Nontraumatic intracerebral hemorrhage, unspecified: Secondary | ICD-10-CM | POA: Diagnosis not present

## 2023-11-06 DIAGNOSIS — J439 Emphysema, unspecified: Secondary | ICD-10-CM | POA: Diagnosis not present

## 2023-11-06 DIAGNOSIS — Z86718 Personal history of other venous thrombosis and embolism: Secondary | ICD-10-CM | POA: Diagnosis not present

## 2023-11-06 DIAGNOSIS — R269 Unspecified abnormalities of gait and mobility: Secondary | ICD-10-CM | POA: Diagnosis not present

## 2023-11-06 DIAGNOSIS — L409 Psoriasis, unspecified: Secondary | ICD-10-CM | POA: Diagnosis not present

## 2023-11-06 DIAGNOSIS — I890 Lymphedema, not elsewhere classified: Secondary | ICD-10-CM | POA: Diagnosis not present

## 2023-11-06 DIAGNOSIS — I629 Nontraumatic intracranial hemorrhage, unspecified: Secondary | ICD-10-CM | POA: Diagnosis not present

## 2023-11-06 DIAGNOSIS — I10 Essential (primary) hypertension: Secondary | ICD-10-CM | POA: Diagnosis not present

## 2023-11-06 DIAGNOSIS — R531 Weakness: Secondary | ICD-10-CM | POA: Diagnosis not present

## 2023-11-07 DIAGNOSIS — F329 Major depressive disorder, single episode, unspecified: Secondary | ICD-10-CM | POA: Diagnosis not present

## 2023-11-07 DIAGNOSIS — F411 Generalized anxiety disorder: Secondary | ICD-10-CM | POA: Diagnosis not present

## 2023-11-07 DIAGNOSIS — E876 Hypokalemia: Secondary | ICD-10-CM | POA: Diagnosis not present

## 2023-11-07 DIAGNOSIS — I10 Essential (primary) hypertension: Secondary | ICD-10-CM | POA: Diagnosis not present

## 2023-11-07 DIAGNOSIS — I890 Lymphedema, not elsewhere classified: Secondary | ICD-10-CM | POA: Diagnosis not present

## 2023-11-07 DIAGNOSIS — M810 Age-related osteoporosis without current pathological fracture: Secondary | ICD-10-CM | POA: Diagnosis not present

## 2023-11-07 DIAGNOSIS — M1990 Unspecified osteoarthritis, unspecified site: Secondary | ICD-10-CM | POA: Diagnosis not present

## 2023-11-07 DIAGNOSIS — K219 Gastro-esophageal reflux disease without esophagitis: Secondary | ICD-10-CM | POA: Diagnosis not present

## 2023-11-07 DIAGNOSIS — I619 Nontraumatic intracerebral hemorrhage, unspecified: Secondary | ICD-10-CM | POA: Diagnosis not present

## 2023-11-08 DIAGNOSIS — M810 Age-related osteoporosis without current pathological fracture: Secondary | ICD-10-CM | POA: Diagnosis not present

## 2023-11-08 DIAGNOSIS — I10 Essential (primary) hypertension: Secondary | ICD-10-CM | POA: Diagnosis not present

## 2023-11-08 DIAGNOSIS — I890 Lymphedema, not elsewhere classified: Secondary | ICD-10-CM | POA: Diagnosis not present

## 2023-11-08 DIAGNOSIS — K219 Gastro-esophageal reflux disease without esophagitis: Secondary | ICD-10-CM | POA: Diagnosis not present

## 2023-11-08 DIAGNOSIS — I619 Nontraumatic intracerebral hemorrhage, unspecified: Secondary | ICD-10-CM | POA: Diagnosis not present

## 2023-11-08 DIAGNOSIS — M1990 Unspecified osteoarthritis, unspecified site: Secondary | ICD-10-CM | POA: Diagnosis not present

## 2023-11-08 DIAGNOSIS — F411 Generalized anxiety disorder: Secondary | ICD-10-CM | POA: Diagnosis not present

## 2023-11-08 DIAGNOSIS — E876 Hypokalemia: Secondary | ICD-10-CM | POA: Diagnosis not present

## 2023-11-08 DIAGNOSIS — F329 Major depressive disorder, single episode, unspecified: Secondary | ICD-10-CM | POA: Diagnosis not present

## 2023-11-12 ENCOUNTER — Telehealth: Payer: Self-pay | Admitting: *Deleted

## 2023-11-12 DIAGNOSIS — F329 Major depressive disorder, single episode, unspecified: Secondary | ICD-10-CM | POA: Diagnosis not present

## 2023-11-12 DIAGNOSIS — I890 Lymphedema, not elsewhere classified: Secondary | ICD-10-CM | POA: Diagnosis not present

## 2023-11-12 DIAGNOSIS — I619 Nontraumatic intracerebral hemorrhage, unspecified: Secondary | ICD-10-CM | POA: Diagnosis not present

## 2023-11-12 DIAGNOSIS — E876 Hypokalemia: Secondary | ICD-10-CM | POA: Diagnosis not present

## 2023-11-12 DIAGNOSIS — F411 Generalized anxiety disorder: Secondary | ICD-10-CM | POA: Diagnosis not present

## 2023-11-12 DIAGNOSIS — K219 Gastro-esophageal reflux disease without esophagitis: Secondary | ICD-10-CM | POA: Diagnosis not present

## 2023-11-12 DIAGNOSIS — M1990 Unspecified osteoarthritis, unspecified site: Secondary | ICD-10-CM | POA: Diagnosis not present

## 2023-11-12 DIAGNOSIS — I10 Essential (primary) hypertension: Secondary | ICD-10-CM | POA: Diagnosis not present

## 2023-11-12 DIAGNOSIS — M810 Age-related osteoporosis without current pathological fracture: Secondary | ICD-10-CM | POA: Diagnosis not present

## 2023-11-12 NOTE — Telephone Encounter (Signed)
No additional recommendations. Thanks!

## 2023-11-12 NOTE — Telephone Encounter (Signed)
 Spoke with patient. Patient reports blood on tissue when she wipes for the past week. States she believes it is coming from the rectum. Denies pain, heavy bleeding, straining with BM, hemorrhoids, odor, urinary symptoms. Has a pessary, this has been out for the past month. OV scheduled for 5/29 at 1500 with Tiffany. Advised if new symptoms develop or symptoms get worse to return call to the office. Advised I will review with Tiffany and return call if any additional recommendations. Patient agreeable.    Routing to Tiffany to review and advise if any additional recommendations.

## 2023-11-14 ENCOUNTER — Encounter: Payer: Self-pay | Admitting: Nurse Practitioner

## 2023-11-14 ENCOUNTER — Ambulatory Visit (INDEPENDENT_AMBULATORY_CARE_PROVIDER_SITE_OTHER): Admitting: Nurse Practitioner

## 2023-11-14 VITALS — BP 134/78 | HR 70

## 2023-11-14 DIAGNOSIS — M810 Age-related osteoporosis without current pathological fracture: Secondary | ICD-10-CM | POA: Diagnosis not present

## 2023-11-14 DIAGNOSIS — I10 Essential (primary) hypertension: Secondary | ICD-10-CM | POA: Diagnosis not present

## 2023-11-14 DIAGNOSIS — K219 Gastro-esophageal reflux disease without esophagitis: Secondary | ICD-10-CM | POA: Diagnosis not present

## 2023-11-14 DIAGNOSIS — F329 Major depressive disorder, single episode, unspecified: Secondary | ICD-10-CM | POA: Diagnosis not present

## 2023-11-14 DIAGNOSIS — K625 Hemorrhage of anus and rectum: Secondary | ICD-10-CM | POA: Diagnosis not present

## 2023-11-14 DIAGNOSIS — I619 Nontraumatic intracerebral hemorrhage, unspecified: Secondary | ICD-10-CM | POA: Diagnosis not present

## 2023-11-14 DIAGNOSIS — M1990 Unspecified osteoarthritis, unspecified site: Secondary | ICD-10-CM | POA: Diagnosis not present

## 2023-11-14 DIAGNOSIS — I890 Lymphedema, not elsewhere classified: Secondary | ICD-10-CM | POA: Diagnosis not present

## 2023-11-14 DIAGNOSIS — F411 Generalized anxiety disorder: Secondary | ICD-10-CM | POA: Diagnosis not present

## 2023-11-14 DIAGNOSIS — E876 Hypokalemia: Secondary | ICD-10-CM | POA: Diagnosis not present

## 2023-11-14 NOTE — Progress Notes (Signed)
   Acute Office Visit  Subjective:    Patient ID: [name], female    DOB: [birth year], 83 y.o.   MRN: [id number]   HPI 83 y.o. presents today for rectal bleeding. Reports blood on tissue with wiping for 1 week, intermittently, only with bowel movements. Has been having diarrhea since around the time she had hemorrhagic stroke in April. Denies pain, heavy bleeding, straining with BM, hemorrhoids. Uses pessary but this has been out for a couple of months to allow healing of pressure lesion. Using Vit E suppositories. 2020 colonoscopy at [facility] (report not in EPIC). Sister present.   No LMP recorded. Patient is postmenopausal.    Review of Systems  Constitutional: Negative.   Gastrointestinal:  Positive for anal bleeding and diarrhea. Negative for abdominal pain, blood in stool, constipation and rectal pain.       Objective:     Physical Exam Constitutional:      Appearance: Normal appearance.  Genitourinary:    General: Normal vulva.     Vagina: Normal.     Uterus: With uterine prolapse.      Rectum: Guaiac stool: Inadequate sample. No tenderness or external hemorrhoid.     Comments: Unable to tolerate rectal exam. Q-tip inserted about 1/2 inch, minimal stool.    BP 134/78 (BP Location: Right Arm, Patient Position: Sitting)   Pulse 70   SpO2 93%  Wt Readings from Last 3 Encounters:  10/23/23 170 lb (77.1 kg)  10/04/23 168 lb (76.2 kg)  08/14/23 215 lb (97.5 kg)        Patient informed chaperone available to be present for breast and/or pelvic exam. Patient has requested no chaperone to be present. Patient has been advised what will be completed during breast and pelvic exam.   Assessment & Plan:   Problem List Items Addressed This Visit   None Visit Diagnoses       Rectal bleeding    -  Primary      Plan: Unable to perform hemoccult testing. No evidence of blood on Q-tip today. No external hemorrhoids. Normal GYN exam, no evidence of vaginal bleeding,  Pressure lesion has healed. We will call when pessary is delivered (on back order). She will follow up with [facility].    Return if symptoms worsen or fail to improve.    Andee Bamberger DNP, 4:28 PM 11/14/2023

## 2023-11-18 ENCOUNTER — Telehealth: Payer: Self-pay | Admitting: *Deleted

## 2023-11-18 DIAGNOSIS — E876 Hypokalemia: Secondary | ICD-10-CM | POA: Diagnosis not present

## 2023-11-18 DIAGNOSIS — K219 Gastro-esophageal reflux disease without esophagitis: Secondary | ICD-10-CM | POA: Diagnosis not present

## 2023-11-18 DIAGNOSIS — M1990 Unspecified osteoarthritis, unspecified site: Secondary | ICD-10-CM | POA: Diagnosis not present

## 2023-11-18 DIAGNOSIS — F329 Major depressive disorder, single episode, unspecified: Secondary | ICD-10-CM | POA: Diagnosis not present

## 2023-11-18 DIAGNOSIS — I619 Nontraumatic intracerebral hemorrhage, unspecified: Secondary | ICD-10-CM | POA: Diagnosis not present

## 2023-11-18 DIAGNOSIS — I890 Lymphedema, not elsewhere classified: Secondary | ICD-10-CM | POA: Diagnosis not present

## 2023-11-18 DIAGNOSIS — M810 Age-related osteoporosis without current pathological fracture: Secondary | ICD-10-CM | POA: Diagnosis not present

## 2023-11-18 DIAGNOSIS — I10 Essential (primary) hypertension: Secondary | ICD-10-CM | POA: Diagnosis not present

## 2023-11-18 DIAGNOSIS — F411 Generalized anxiety disorder: Secondary | ICD-10-CM | POA: Diagnosis not present

## 2023-11-18 NOTE — Telephone Encounter (Signed)
 Short stem or regular stem?

## 2023-11-18 NOTE — Telephone Encounter (Signed)
 Pessary ordered, estimated delivery 11/23/23.

## 2023-11-18 NOTE — Telephone Encounter (Signed)
-----   Message from Andee Bamberger sent at 11/18/2023  9:45 AM EDT ----- Regarding: RE: Pessary patient Gelhorn 2 1/4. ----- Message ----- From: Yale Held, RN Sent: 11/15/2023  12:40 PM EDT To: Andee Bamberger, NP Subject: RE: Pessary patient                            Tiffany,   I looked in this patients chart and my messages, I do not have a request for a pessary.   Can you tell me what she needs so I can get it ordered today.   Thank you!  [facility] ----- Message ----- From: Andee Bamberger, NP Sent: 11/14/2023   3:33 PM EDT To: Yale Held, RN Subject: Pessary patient

## 2023-11-21 DIAGNOSIS — K623 Rectal prolapse: Secondary | ICD-10-CM | POA: Diagnosis not present

## 2023-11-21 DIAGNOSIS — K625 Hemorrhage of anus and rectum: Secondary | ICD-10-CM | POA: Diagnosis not present

## 2023-11-22 DIAGNOSIS — I10 Essential (primary) hypertension: Secondary | ICD-10-CM | POA: Diagnosis not present

## 2023-11-22 DIAGNOSIS — E876 Hypokalemia: Secondary | ICD-10-CM | POA: Diagnosis not present

## 2023-11-22 DIAGNOSIS — M810 Age-related osteoporosis without current pathological fracture: Secondary | ICD-10-CM | POA: Diagnosis not present

## 2023-11-22 DIAGNOSIS — F411 Generalized anxiety disorder: Secondary | ICD-10-CM | POA: Diagnosis not present

## 2023-11-22 DIAGNOSIS — I890 Lymphedema, not elsewhere classified: Secondary | ICD-10-CM | POA: Diagnosis not present

## 2023-11-22 DIAGNOSIS — K219 Gastro-esophageal reflux disease without esophagitis: Secondary | ICD-10-CM | POA: Diagnosis not present

## 2023-11-22 DIAGNOSIS — I619 Nontraumatic intracerebral hemorrhage, unspecified: Secondary | ICD-10-CM | POA: Diagnosis not present

## 2023-11-22 DIAGNOSIS — M1990 Unspecified osteoarthritis, unspecified site: Secondary | ICD-10-CM | POA: Diagnosis not present

## 2023-11-22 DIAGNOSIS — F329 Major depressive disorder, single episode, unspecified: Secondary | ICD-10-CM | POA: Diagnosis not present

## 2023-11-25 DIAGNOSIS — F329 Major depressive disorder, single episode, unspecified: Secondary | ICD-10-CM | POA: Diagnosis not present

## 2023-11-25 DIAGNOSIS — E876 Hypokalemia: Secondary | ICD-10-CM | POA: Diagnosis not present

## 2023-11-25 DIAGNOSIS — I890 Lymphedema, not elsewhere classified: Secondary | ICD-10-CM | POA: Diagnosis not present

## 2023-11-25 DIAGNOSIS — K219 Gastro-esophageal reflux disease without esophagitis: Secondary | ICD-10-CM | POA: Diagnosis not present

## 2023-11-25 DIAGNOSIS — M1990 Unspecified osteoarthritis, unspecified site: Secondary | ICD-10-CM | POA: Diagnosis not present

## 2023-11-25 DIAGNOSIS — I619 Nontraumatic intracerebral hemorrhage, unspecified: Secondary | ICD-10-CM | POA: Diagnosis not present

## 2023-11-25 DIAGNOSIS — I10 Essential (primary) hypertension: Secondary | ICD-10-CM | POA: Diagnosis not present

## 2023-11-25 DIAGNOSIS — F411 Generalized anxiety disorder: Secondary | ICD-10-CM | POA: Diagnosis not present

## 2023-11-25 DIAGNOSIS — M810 Age-related osteoporosis without current pathological fracture: Secondary | ICD-10-CM | POA: Diagnosis not present

## 2023-11-25 NOTE — Telephone Encounter (Signed)
 Call placed to patient, left message advising pessary in office, return call to office at [phone], opt 1, to schedule OV with Tiffany.

## 2023-11-25 NOTE — Telephone Encounter (Signed)
 Spoke with patient. Patient states she does not want to schedule OV if not needed. Advised I will forward to Tiffany to confirm and our office will follow-up. Patient agreeable.   Tiffany -please confirm if OV needed.

## 2023-11-26 DIAGNOSIS — K219 Gastro-esophageal reflux disease without esophagitis: Secondary | ICD-10-CM | POA: Diagnosis not present

## 2023-11-26 DIAGNOSIS — I10 Essential (primary) hypertension: Secondary | ICD-10-CM | POA: Diagnosis not present

## 2023-11-26 DIAGNOSIS — I619 Nontraumatic intracerebral hemorrhage, unspecified: Secondary | ICD-10-CM | POA: Diagnosis not present

## 2023-11-26 DIAGNOSIS — M810 Age-related osteoporosis without current pathological fracture: Secondary | ICD-10-CM | POA: Diagnosis not present

## 2023-11-26 DIAGNOSIS — F411 Generalized anxiety disorder: Secondary | ICD-10-CM | POA: Diagnosis not present

## 2023-11-26 DIAGNOSIS — M1990 Unspecified osteoarthritis, unspecified site: Secondary | ICD-10-CM | POA: Diagnosis not present

## 2023-11-26 DIAGNOSIS — F329 Major depressive disorder, single episode, unspecified: Secondary | ICD-10-CM | POA: Diagnosis not present

## 2023-11-26 DIAGNOSIS — E876 Hypokalemia: Secondary | ICD-10-CM | POA: Diagnosis not present

## 2023-11-26 DIAGNOSIS — I890 Lymphedema, not elsewhere classified: Secondary | ICD-10-CM | POA: Diagnosis not present

## 2023-11-26 NOTE — Telephone Encounter (Signed)
 Is she not wanting to put the pessary back in?

## 2023-11-27 DIAGNOSIS — I83012 Varicose veins of right lower extremity with ulcer of calf: Secondary | ICD-10-CM | POA: Diagnosis not present

## 2023-11-27 DIAGNOSIS — I619 Nontraumatic intracerebral hemorrhage, unspecified: Secondary | ICD-10-CM | POA: Diagnosis not present

## 2023-11-27 DIAGNOSIS — M810 Age-related osteoporosis without current pathological fracture: Secondary | ICD-10-CM | POA: Diagnosis not present

## 2023-11-27 DIAGNOSIS — F411 Generalized anxiety disorder: Secondary | ICD-10-CM | POA: Diagnosis not present

## 2023-11-27 DIAGNOSIS — M25371 Other instability, right ankle: Secondary | ICD-10-CM | POA: Diagnosis not present

## 2023-11-27 DIAGNOSIS — I890 Lymphedema, not elsewhere classified: Secondary | ICD-10-CM | POA: Diagnosis not present

## 2023-11-27 DIAGNOSIS — L97211 Non-pressure chronic ulcer of right calf limited to breakdown of skin: Secondary | ICD-10-CM | POA: Diagnosis not present

## 2023-11-27 DIAGNOSIS — I739 Peripheral vascular disease, unspecified: Secondary | ICD-10-CM | POA: Diagnosis not present

## 2023-11-27 DIAGNOSIS — M792 Neuralgia and neuritis, unspecified: Secondary | ICD-10-CM | POA: Diagnosis not present

## 2023-11-27 DIAGNOSIS — M1990 Unspecified osteoarthritis, unspecified site: Secondary | ICD-10-CM | POA: Diagnosis not present

## 2023-11-27 DIAGNOSIS — E876 Hypokalemia: Secondary | ICD-10-CM | POA: Diagnosis not present

## 2023-11-27 DIAGNOSIS — F329 Major depressive disorder, single episode, unspecified: Secondary | ICD-10-CM | POA: Diagnosis not present

## 2023-11-27 DIAGNOSIS — K219 Gastro-esophageal reflux disease without esophagitis: Secondary | ICD-10-CM | POA: Diagnosis not present

## 2023-11-27 DIAGNOSIS — I10 Essential (primary) hypertension: Secondary | ICD-10-CM | POA: Diagnosis not present

## 2023-11-27 DIAGNOSIS — B351 Tinea unguium: Secondary | ICD-10-CM | POA: Diagnosis not present

## 2023-11-27 DIAGNOSIS — M21861 Other specified acquired deformities of right lower leg: Secondary | ICD-10-CM | POA: Diagnosis not present

## 2023-11-27 DIAGNOSIS — M722 Plantar fascial fibromatosis: Secondary | ICD-10-CM | POA: Diagnosis not present

## 2023-11-27 NOTE — Telephone Encounter (Signed)
 Patient can place herself, does not want OV if not needed.

## 2023-12-02 DIAGNOSIS — M1990 Unspecified osteoarthritis, unspecified site: Secondary | ICD-10-CM | POA: Diagnosis not present

## 2023-12-02 DIAGNOSIS — I619 Nontraumatic intracerebral hemorrhage, unspecified: Secondary | ICD-10-CM | POA: Diagnosis not present

## 2023-12-02 DIAGNOSIS — K219 Gastro-esophageal reflux disease without esophagitis: Secondary | ICD-10-CM | POA: Diagnosis not present

## 2023-12-02 DIAGNOSIS — I10 Essential (primary) hypertension: Secondary | ICD-10-CM | POA: Diagnosis not present

## 2023-12-02 DIAGNOSIS — M810 Age-related osteoporosis without current pathological fracture: Secondary | ICD-10-CM | POA: Diagnosis not present

## 2023-12-02 DIAGNOSIS — E876 Hypokalemia: Secondary | ICD-10-CM | POA: Diagnosis not present

## 2023-12-02 DIAGNOSIS — F329 Major depressive disorder, single episode, unspecified: Secondary | ICD-10-CM | POA: Diagnosis not present

## 2023-12-02 DIAGNOSIS — F411 Generalized anxiety disorder: Secondary | ICD-10-CM | POA: Diagnosis not present

## 2023-12-02 DIAGNOSIS — I890 Lymphedema, not elsewhere classified: Secondary | ICD-10-CM | POA: Diagnosis not present

## 2023-12-03 NOTE — Telephone Encounter (Signed)
 Patient left message requesting return call to schedule OV for pessary to be inserted. States she is having difficulty placing.   Left message to call [facility] Triage at [phone], option 4.

## 2023-12-04 ENCOUNTER — Telehealth: Payer: Self-pay | Admitting: Obstetrics and Gynecology

## 2023-12-04 DIAGNOSIS — M8580 Other specified disorders of bone density and structure, unspecified site: Secondary | ICD-10-CM

## 2023-12-04 NOTE — Telephone Encounter (Signed)
 [name]

## 2023-12-06 ENCOUNTER — Ambulatory Visit (INDEPENDENT_AMBULATORY_CARE_PROVIDER_SITE_OTHER): Admitting: Obstetrics and Gynecology

## 2023-12-06 ENCOUNTER — Encounter: Payer: Self-pay | Admitting: Obstetrics and Gynecology

## 2023-12-06 VITALS — BP 118/72 | HR 83

## 2023-12-06 DIAGNOSIS — K921 Melena: Secondary | ICD-10-CM

## 2023-12-06 DIAGNOSIS — N813 Complete uterovaginal prolapse: Secondary | ICD-10-CM | POA: Diagnosis not present

## 2023-12-06 NOTE — Progress Notes (Signed)
   Acute Office Visit  Subjective:    Patient ID: [name], female    DOB: Oct 15, 1940, 83 y.o.   MRN: [id number]   HPI 83 y.o. presents today for Office Visit (Pessary and rectum issues ) .She cannot place the pessary by herself H/o stroke in June Seeing GI soon for rectal bleeding Has a new pessary that Tiffany ordered for her to put in. Denies any complications voiding  Denies any VB No LMP recorded. Patient is postmenopausal.    Review of Systems     Objective:    OBGyn Exam  BP 118/72 (BP Location: Left Arm, Patient Position: Sitting)   Pulse 83   SpO2 94%  Wt Readings from Last 3 Encounters:  10/23/23 170 lb (77.1 kg)  10/04/23 168 lb (76.2 kg)  08/14/23 215 lb (97.5 kg)      SVE: complete procidentia  Gelhorn #3 inserted No lesions or bleeding  Patient informed chaperone available to be present for breast and/or pelvic exam. Patient has requested no chaperone to be present. Patient has been advised what will be completed during breast and pelvic exam.   Assessment & Plan:  Procidentia Referral to GI and urogyn Bone scan referral placed RTC with any concerns and in one month  Dr. Caro Christmas

## 2023-12-06 NOTE — Telephone Encounter (Signed)
 Patient was seen in the office today by Dr. Tia Flowers.   Encounter closed.

## 2023-12-09 ENCOUNTER — Ambulatory Visit (HOSPITAL_COMMUNITY)
Admission: EM | Admit: 2023-12-09 | Discharge: 2023-12-09 | Disposition: A | Discharge status: Home / Self Care | Attending: Nurse Practitioner | Admitting: Nurse Practitioner

## 2023-12-09 ENCOUNTER — Other Ambulatory Visit: Payer: Self-pay

## 2023-12-09 ENCOUNTER — Encounter (HOSPITAL_COMMUNITY): Payer: Self-pay | Admitting: Emergency Medicine

## 2023-12-09 DIAGNOSIS — I890 Lymphedema, not elsewhere classified: Secondary | ICD-10-CM

## 2023-12-09 DIAGNOSIS — S81801A Unspecified open wound, right lower leg, initial encounter: Secondary | ICD-10-CM | POA: Diagnosis not present

## 2023-12-09 NOTE — ED Triage Notes (Signed)
 Has a history of lymphedema.    Patient reports having areas that were fluid filled bumps that are now weeping wounds.  To both lower legs.  Issues particularly with right lower leg  Patient reports she had a stroke last month, reports admission to the hospital

## 2023-12-09 NOTE — Discharge Instructions (Addendum)
 I have put in a referral to the wound care center today - please call them to make an appointment for follow up of the wound on your right leg.  If they are not able to get you in this week, please follow up with your PCP later this week.   Recommend cleaning the area twice daily with soap and water and applying a thin layer of antibacterial ointment and then nonadherent gauze and then kerlix gauze over wound.  Follow up at wound care center or PCP.  If symptoms worsen despite treatment, return to urgent care or go to ER.

## 2023-12-09 NOTE — ED Provider Notes (Addendum)
 [facility]    CSN: [id number] Arrival date & time: 12/09/23  0930      History   Chief Complaint Chief Complaint  Patient presents with   leg issue    HPI [name] is a 83 y.o. female.   Patient presents today for 3 to 4-day history of right lower extremity weeping and blistering.  She has concern that he may be coming up on the left leg as well.  She reports history of lymphedema, has seen wound clinic in the past and was discharged because the sores healed up.  Patient is unable to perform wound care on her own and does not currently have any home health services.  She denies fever, nausea/vomiting, or warmth from the areas.  No chest pain or shortness of breath.  She is wondering if she needs an antibiotic.    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/2019   Cystoid macular edema of right eye 09/24/2019   Detrusor instability    GERD (gastroesophageal reflux disease)    Hemorrhagic stroke (HCC)    6/25   Osteoporosis    Psoriasis    Sciatic pain    Uterine prolapse     Patient Active Problem List   Diagnosis Date Noted   Basal ganglia hemorrhage (HCC) 10/07/2023   ICH (intracerebral hemorrhage) (HCC) 10/04/2023   Cellulitis 08/16/2022   Bilateral lower extremity edema 08/16/2022   GERD (gastroesophageal reflux disease) 08/16/2022   Acute back pain 05/25/2022   Recurrent falls 05/23/2022   Class 1 obesity due to excess calories with body mass index (BMI) of 30.0 to 30.9 in adult 05/23/2022   Advanced nonexudative age-related macular degeneration of right eye with subfoveal involvement 05/04/2021   Early stage nonexudative age-related macular degeneration of left eye 12/01/2020   Nuclear sclerotic cataract of left eye 02/25/2020   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   Exudative age-related macular degeneration  of right eye with inactive choroidal neovascularization (HCC) 09/24/2019   Retinal hemorrhage of right eye 09/24/2019   Retinal exudates and deposits 09/24/2019   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   Mastodynia, female 03/19/2016   Cystocele 02/08/2014   Vaginal atrophy 01/28/2014   History of herpes simplex infection 01/28/2014   Knee pain, acute 03/05/2013   Anxiety and depression 02/05/2013   Constipation 02/05/2013   Psoriasis 02/05/2013   Failed total knee arthroplasty (HCC) 01/23/2013   Sciatic pain    Detrusor instability    Uterine prolapse    Atrophic vaginitis    Osteoporosis     Past Surgical History:  Procedure Laterality Date   APPENDECTOMY     FOOT SURGERY     KNEE SURGERY     TOTAL KNEE ARTHROPLASTY Bilateral    X2-- 2009; 2010   TOTAL KNEE REVISION Left 01/23/2013   Procedure:  TOTAL KNEE ARTHROPLASTY REVISION ;  Surgeon: Dempsey LULLA Moan, MD;  Location: [facility];  Service: Orthopedics;  Laterality: Left;    OB History     Gravida  3   Para  3   Term  3   Preterm      AB      Living  4      SAB      IAB      Ectopic      Multiple  1   Live Births  Home Medications    Prior to Admission medications   Medication Sig Start Date End Date Taking? Authorizing Provider  acetaminophen  (TYLENOL ) 325 MG tablet Take 2 tablets (650 mg total) by mouth every 6 (six) hours as needed for mild pain (or Fever >/= 101). 05/27/22   Krishnan, Gokul, MD  Calcium  Carb-Cholecalciferol  (CALCIUM  + VITAMIN D3 PO) Take 1 tablet by mouth daily.    [provider]  cetirizine (ZYRTEC) 10 MG tablet Take 10 mg by mouth daily as needed for allergies.    [provider]  cyanocobalamin  1000 MCG tablet Take 1 tablet (1,000 mcg total) by mouth daily. 05/30/22   Krishnan, Gokul, MD  FLUoxetine  (PROZAC ) 20 MG tablet Take 60 mg by mouth in the morning.    [provider]  furosemide  (LASIX ) 40 MG tablet Take 1 tablet (40  mg total) by mouth daily. 10/08/23   Jillian Buttery, MD  LORazepam  (ATIVAN ) 0.5 MG tablet Take 1 tablet (0.5 mg total) by mouth daily as needed for anxiety or sleep. 10/07/23   Jillian Buttery, MD  losartan-hydrochlorothiazide (HYZAAR) 50-12.5 MG tablet Take 1 tablet by mouth daily. 10/26/23   [provider]  polyethylene glycol (MIRALAX  / GLYCOLAX ) 17 g packet Take 17 g by mouth daily as needed. 10/07/23   Jillian Buttery, MD  potassium chloride  SA (KLOR-CON  M) 20 MEQ tablet Take 1 tablet (20 mEq total) by mouth daily. 10/07/23   Jillian Buttery, MD  senna-docusate (SENOKOT-S) 8.6-50 MG tablet Take 2 tablets by mouth 2 (two) times daily. Patient not taking: Reported on 12/06/2023 05/27/22   Verdene Purchase, MD  TYLENOL  8 HOUR ARTHRITIS PAIN 650 MG CR tablet Take 1,300 mg by mouth every 8 (eight) hours as needed for pain. Patient not taking: Reported on 12/06/2023    [provider]  valACYclovir  (VALTREX ) 500 MG tablet Take 1 tablet (500 mg total) by mouth every other day. 10/30/22   Prentiss Annabella LABOR, NP    Family History Family History  Problem Relation Age of Onset   Cancer Mother        GALLBLADDER    Breast cancer Cousin        Mat. 1st cousin Age 58    Social History Social History   Tobacco Use   Smoking status: Former    Current packs/day: 0.00    Types: Cigarettes    Quit date: 07/25/2015    Years since quitting: 8.3   Smokeless tobacco: Never  Vaping Use   Vaping status: Never Used  Substance Use Topics   Alcohol use: No    Alcohol/week: 0.0 standard drinks of alcohol   Drug use: No     Allergies   Other   Review of Systems Review of Systems Per HPI  Physical Exam Triage Vital Signs ED Triage Vitals  Encounter Vitals Group     BP 12/09/23 1033 (!) 174/70     Girls Systolic BP Percentile --      Girls Diastolic BP Percentile --      Boys Systolic BP Percentile --      Boys Diastolic BP Percentile --      Pulse Rate 12/09/23 1033 65      Resp 12/09/23 1033 18     Temp 12/09/23 1033 98 F (36.7 C)     Temp Source 12/09/23 1033 Oral     SpO2 12/09/23 1033 94 %     Weight --      Height --  Head Circumference --      Peak Flow --      Pain Score 12/09/23 1029 0     Pain Loc --      Pain Education --      Exclude from Growth Chart --    No data found.  Updated Vital Signs BP (!) 174/70 (BP Location: Left Arm)   Pulse 65   Temp 98 F (36.7 C) (Oral)   Resp 18   SpO2 94%   Visual Acuity Right Eye Distance:   Left Eye Distance:   Bilateral Distance:    Right Eye Near:   Left Eye Near:    Bilateral Near:     Physical Exam Vitals and nursing note reviewed.  Constitutional:      General: She is not in acute distress.    Appearance: Normal appearance. She is not toxic-appearing.  HENT:     Head: Normocephalic and atraumatic.     Right Ear: External ear normal.     Left Ear: External ear normal.     Nose: Nose normal. No congestion or rhinorrhea.   Cardiovascular:     Rate and Rhythm: Normal rate.  Pulmonary:     Effort: Pulmonary effort is normal. No respiratory distress.   Musculoskeletal:     Right lower leg: Edema present.     Left lower leg: Edema present.     Comments: +1 pitting edema bilateral lower extremities.  Blistering noted to right lower extremity with weeping.  There is slight erythema of the skin of the right lower extremity, however no warmth or tenderness to touch.  No purulent drainage.   Skin:    General: Skin is warm and dry.     Capillary Refill: Capillary refill takes less than 2 seconds.     Coloration: Skin is not jaundiced or pale.     Findings: Erythema present.   Neurological:     Mental Status: She is alert and oriented to person, place, and time.   Psychiatric:        Behavior: Behavior is cooperative.      UC Treatments / Results  Labs (all labs ordered are listed, but only abnormal results are displayed) Labs Reviewed - No data to  display  EKG   Radiology No results found.  Procedures Procedures (including critical care time)  Medications Ordered in UC Medications - No data to display  Initial Impression / Assessment and Plan / UC Course  I have reviewed the triage vital signs and the nursing notes.  Pertinent labs & imaging results that were available during my care of the patient were reviewed by me and considered in my medical decision making (see chart for details).   Patient is mildly hypertensive in triage, otherwise vital signs are stable.  1. Wound of right lower extremity, initial encounter  2. Lymphedema Referral made to wound care clinic, patient is to call and make an appointment No signs or symptoms of DVT today Wound care discussed and applied today No signs or symptoms of cellulitis, antibiotic deferred today Seek care if symptoms worsen  The patient was given the opportunity to ask questions.  All questions answered to their satisfaction.  The patient is in agreement to this plan.   Final Clinical Impressions(s) / UC Diagnoses   Final diagnoses:  Wound of right lower extremity, initial encounter  Lymphedema     Discharge Instructions      I have put in a referral to the wound care center  today - please call them to make an appointment for follow up of the wound on your right leg.  If they are not able to get you in this week, please follow up with your PCP later this week.   Recommend cleaning the area twice daily with soap and water and applying a thin layer of antibacterial ointment and then nonadherent gauze and then kerlix gauze over wound.  Follow up at wound care center or PCP.  If symptoms worsen despite treatment, return to urgent care or go to ER.    ED Prescriptions   None    PDMP not reviewed this encounter.   Chandra Harlene LABOR, NP 12/09/23 1306    Chandra Harlene LABOR, NP 12/09/23 [phone]

## 2023-12-11 DIAGNOSIS — E876 Hypokalemia: Secondary | ICD-10-CM | POA: Diagnosis not present

## 2023-12-11 DIAGNOSIS — M1990 Unspecified osteoarthritis, unspecified site: Secondary | ICD-10-CM | POA: Diagnosis not present

## 2023-12-11 DIAGNOSIS — M810 Age-related osteoporosis without current pathological fracture: Secondary | ICD-10-CM | POA: Diagnosis not present

## 2023-12-11 DIAGNOSIS — I890 Lymphedema, not elsewhere classified: Secondary | ICD-10-CM | POA: Diagnosis not present

## 2023-12-11 DIAGNOSIS — K219 Gastro-esophageal reflux disease without esophagitis: Secondary | ICD-10-CM | POA: Diagnosis not present

## 2023-12-11 DIAGNOSIS — I619 Nontraumatic intracerebral hemorrhage, unspecified: Secondary | ICD-10-CM | POA: Diagnosis not present

## 2023-12-11 DIAGNOSIS — I10 Essential (primary) hypertension: Secondary | ICD-10-CM | POA: Diagnosis not present

## 2023-12-11 DIAGNOSIS — F329 Major depressive disorder, single episode, unspecified: Secondary | ICD-10-CM | POA: Diagnosis not present

## 2023-12-11 DIAGNOSIS — F411 Generalized anxiety disorder: Secondary | ICD-10-CM | POA: Diagnosis not present

## 2023-12-12 DIAGNOSIS — K625 Hemorrhage of anus and rectum: Secondary | ICD-10-CM | POA: Diagnosis not present

## 2023-12-18 DIAGNOSIS — E876 Hypokalemia: Secondary | ICD-10-CM | POA: Diagnosis not present

## 2023-12-18 DIAGNOSIS — M1990 Unspecified osteoarthritis, unspecified site: Secondary | ICD-10-CM | POA: Diagnosis not present

## 2023-12-18 DIAGNOSIS — F329 Major depressive disorder, single episode, unspecified: Secondary | ICD-10-CM | POA: Diagnosis not present

## 2023-12-18 DIAGNOSIS — I10 Essential (primary) hypertension: Secondary | ICD-10-CM | POA: Diagnosis not present

## 2023-12-18 DIAGNOSIS — I890 Lymphedema, not elsewhere classified: Secondary | ICD-10-CM | POA: Diagnosis not present

## 2023-12-18 DIAGNOSIS — M810 Age-related osteoporosis without current pathological fracture: Secondary | ICD-10-CM | POA: Diagnosis not present

## 2023-12-18 DIAGNOSIS — F411 Generalized anxiety disorder: Secondary | ICD-10-CM | POA: Diagnosis not present

## 2023-12-18 DIAGNOSIS — K219 Gastro-esophageal reflux disease without esophagitis: Secondary | ICD-10-CM | POA: Diagnosis not present

## 2023-12-18 DIAGNOSIS — I619 Nontraumatic intracerebral hemorrhage, unspecified: Secondary | ICD-10-CM | POA: Diagnosis not present

## 2023-12-25 ENCOUNTER — Ambulatory Visit: Admitting: Nurse Practitioner

## 2023-12-25 DIAGNOSIS — I890 Lymphedema, not elsewhere classified: Secondary | ICD-10-CM | POA: Diagnosis not present

## 2023-12-25 DIAGNOSIS — F411 Generalized anxiety disorder: Secondary | ICD-10-CM | POA: Diagnosis not present

## 2023-12-25 DIAGNOSIS — I619 Nontraumatic intracerebral hemorrhage, unspecified: Secondary | ICD-10-CM | POA: Diagnosis not present

## 2023-12-25 DIAGNOSIS — F329 Major depressive disorder, single episode, unspecified: Secondary | ICD-10-CM | POA: Diagnosis not present

## 2023-12-25 DIAGNOSIS — M810 Age-related osteoporosis without current pathological fracture: Secondary | ICD-10-CM | POA: Diagnosis not present

## 2023-12-25 DIAGNOSIS — E876 Hypokalemia: Secondary | ICD-10-CM | POA: Diagnosis not present

## 2023-12-25 DIAGNOSIS — I10 Essential (primary) hypertension: Secondary | ICD-10-CM | POA: Diagnosis not present

## 2023-12-25 DIAGNOSIS — M1990 Unspecified osteoarthritis, unspecified site: Secondary | ICD-10-CM | POA: Diagnosis not present

## 2023-12-25 DIAGNOSIS — K219 Gastro-esophageal reflux disease without esophagitis: Secondary | ICD-10-CM | POA: Diagnosis not present

## 2024-01-02 DIAGNOSIS — M792 Neuralgia and neuritis, unspecified: Secondary | ICD-10-CM | POA: Diagnosis not present

## 2024-01-02 DIAGNOSIS — M216X1 Other acquired deformities of right foot: Secondary | ICD-10-CM | POA: Diagnosis not present

## 2024-01-02 DIAGNOSIS — I739 Peripheral vascular disease, unspecified: Secondary | ICD-10-CM | POA: Diagnosis not present

## 2024-01-02 DIAGNOSIS — M2041 Other hammer toe(s) (acquired), right foot: Secondary | ICD-10-CM | POA: Diagnosis not present

## 2024-01-02 DIAGNOSIS — B351 Tinea unguium: Secondary | ICD-10-CM | POA: Diagnosis not present

## 2024-01-02 DIAGNOSIS — M21861 Other specified acquired deformities of right lower leg: Secondary | ICD-10-CM | POA: Diagnosis not present

## 2024-01-02 DIAGNOSIS — M722 Plantar fascial fibromatosis: Secondary | ICD-10-CM | POA: Diagnosis not present

## 2024-01-29 ENCOUNTER — Ambulatory Visit (HOSPITAL_BASED_OUTPATIENT_CLINIC_OR_DEPARTMENT_OTHER): Admitting: General Surgery

## 2024-02-07 ENCOUNTER — Ambulatory Visit: Admitting: Nurse Practitioner

## 2024-02-07 ENCOUNTER — Encounter: Payer: Self-pay | Admitting: Nurse Practitioner

## 2024-02-07 VITALS — BP 138/82 | Ht 68.5 in | Wt 205.0 lb

## 2024-02-07 DIAGNOSIS — N760 Acute vaginitis: Secondary | ICD-10-CM | POA: Diagnosis not present

## 2024-02-07 DIAGNOSIS — N898 Other specified noninflammatory disorders of vagina: Secondary | ICD-10-CM

## 2024-02-07 DIAGNOSIS — B9689 Other specified bacterial agents as the cause of diseases classified elsewhere: Secondary | ICD-10-CM

## 2024-02-07 LAB — WET PREP FOR TRICH, YEAST, CLUE

## 2024-02-07 MED ORDER — METRONIDAZOLE 500 MG PO TABS: 500.0000 mg | ORAL_TABLET | Freq: Two times a day (BID) | ORAL | 0 refills | Status: DC

## 2024-02-07 NOTE — Progress Notes (Signed)
   Acute Office Visit  Subjective:    Patient ID: [name], female    DOB: [birth year], 83 y.o.   MRN: [id number]   HPI 83 y.o. presents today for vaginal discharge and odor x 1 week. Uses Gelhorn pessary. New pessary ordered and inserted 12/06/23.  No LMP recorded. Patient is postmenopausal.    Review of Systems  Constitutional: Negative.   Genitourinary:  Positive for vaginal discharge.       Vaginal odor       Objective:    Physical Exam Constitutional:      Appearance: Normal appearance.  Genitourinary:    General: Normal vulva.     Vagina: Vaginal discharge present. No erythema.     BP 138/82 (BP Location: Right Arm, Patient Position: Sitting, Cuff Size: Normal)   Ht 5' 8.5 (1.74 m)   Wt 205 lb (93 kg)   BMI 30.72 kg/m  Wt Readings from Last 3 Encounters:  02/07/24 205 lb (93 kg)  10/23/23 170 lb (77.1 kg)  10/04/23 168 lb (76.2 kg)        [name], CMA present as Biomedical engineer.   Wet prep + clue cells  Assessment & Plan:   Problem List Items Addressed This Visit   None Visit Diagnoses       Bacterial vaginosis    -  Primary   Relevant Medications   metroNIDAZOLE  (FLAGYL ) 500 MG tablet     Vaginal discharge       Relevant Orders   WET PREP FOR TRICH, YEAST, CLUE      Plan: Wet prep positive for clue cells - Flagyl  500 mg BID x 7 days.   Return if symptoms worsen or fail to improve.    Brittany DELENA Shutter DNP, 12:11 PM 02/07/2024

## 2024-02-13 DIAGNOSIS — M21861 Other specified acquired deformities of right lower leg: Secondary | ICD-10-CM | POA: Diagnosis not present

## 2024-02-13 DIAGNOSIS — B351 Tinea unguium: Secondary | ICD-10-CM | POA: Diagnosis not present

## 2024-02-13 DIAGNOSIS — M722 Plantar fascial fibromatosis: Secondary | ICD-10-CM | POA: Diagnosis not present

## 2024-02-13 DIAGNOSIS — I739 Peripheral vascular disease, unspecified: Secondary | ICD-10-CM | POA: Diagnosis not present

## 2024-02-13 DIAGNOSIS — M216X1 Other acquired deformities of right foot: Secondary | ICD-10-CM | POA: Diagnosis not present

## 2024-02-13 DIAGNOSIS — M792 Neuralgia and neuritis, unspecified: Secondary | ICD-10-CM | POA: Diagnosis not present

## 2024-02-13 DIAGNOSIS — M2041 Other hammer toe(s) (acquired), right foot: Secondary | ICD-10-CM | POA: Diagnosis not present

## 2024-02-20 DIAGNOSIS — F411 Generalized anxiety disorder: Secondary | ICD-10-CM | POA: Diagnosis not present

## 2024-03-03 DIAGNOSIS — Z8673 Personal history of transient ischemic attack (TIA), and cerebral infarction without residual deficits: Secondary | ICD-10-CM | POA: Diagnosis not present

## 2024-03-03 DIAGNOSIS — R42 Dizziness and giddiness: Secondary | ICD-10-CM | POA: Diagnosis not present

## 2024-03-04 ENCOUNTER — Other Ambulatory Visit (HOSPITAL_COMMUNITY): Payer: Self-pay | Admitting: Internal Medicine

## 2024-03-04 DIAGNOSIS — R42 Dizziness and giddiness: Secondary | ICD-10-CM

## 2024-03-05 ENCOUNTER — Ambulatory Visit (HOSPITAL_COMMUNITY)
Admission: RE | Admit: 2024-03-05 | Discharge: 2024-03-05 | Disposition: A | Discharge status: Home / Self Care | Source: Ambulatory Visit | Attending: Internal Medicine | Admitting: Internal Medicine

## 2024-03-05 DIAGNOSIS — R42 Dizziness and giddiness: Secondary | ICD-10-CM | POA: Insufficient documentation

## 2024-04-01 ENCOUNTER — Other Ambulatory Visit (HOSPITAL_COMMUNITY): Payer: Self-pay

## 2024-05-12 DIAGNOSIS — I890 Lymphedema, not elsewhere classified: Secondary | ICD-10-CM | POA: Diagnosis not present

## 2024-05-12 DIAGNOSIS — M2041 Other hammer toe(s) (acquired), right foot: Secondary | ICD-10-CM | POA: Diagnosis not present

## 2024-05-12 DIAGNOSIS — M6281 Muscle weakness (generalized): Secondary | ICD-10-CM | POA: Diagnosis not present

## 2024-05-12 DIAGNOSIS — B351 Tinea unguium: Secondary | ICD-10-CM | POA: Diagnosis not present

## 2024-05-12 DIAGNOSIS — I70203 Unspecified atherosclerosis of native arteries of extremities, bilateral legs: Secondary | ICD-10-CM | POA: Diagnosis not present

## 2024-05-12 DIAGNOSIS — M216X1 Other acquired deformities of right foot: Secondary | ICD-10-CM | POA: Diagnosis not present

## 2024-05-12 DIAGNOSIS — R262 Difficulty in walking, not elsewhere classified: Secondary | ICD-10-CM | POA: Diagnosis not present

## 2024-05-12 DIAGNOSIS — M21861 Other specified acquired deformities of right lower leg: Secondary | ICD-10-CM | POA: Diagnosis not present

## 2024-06-05 ENCOUNTER — Ambulatory Visit: Admitting: Nurse Practitioner

## 2024-06-26 ENCOUNTER — Other Ambulatory Visit: Payer: Self-pay

## 2024-06-26 ENCOUNTER — Emergency Department (HOSPITAL_COMMUNITY)
Admission: EM | Admit: 2024-06-26 | Discharge: 2024-06-27 | Disposition: A | Attending: Emergency Medicine | Admitting: Emergency Medicine

## 2024-06-26 ENCOUNTER — Encounter (HOSPITAL_COMMUNITY): Payer: Self-pay | Admitting: Emergency Medicine

## 2024-06-26 DIAGNOSIS — J101 Influenza due to other identified influenza virus with other respiratory manifestations: Secondary | ICD-10-CM | POA: Insufficient documentation

## 2024-06-26 DIAGNOSIS — Y92009 Unspecified place in unspecified non-institutional (private) residence as the place of occurrence of the external cause: Secondary | ICD-10-CM | POA: Diagnosis not present

## 2024-06-26 DIAGNOSIS — W0110XA Fall on same level from slipping, tripping and stumbling with subsequent striking against unspecified object, initial encounter: Secondary | ICD-10-CM | POA: Insufficient documentation

## 2024-06-26 DIAGNOSIS — W19XXXA Unspecified fall, initial encounter: Secondary | ICD-10-CM

## 2024-06-26 DIAGNOSIS — M25532 Pain in left wrist: Secondary | ICD-10-CM | POA: Diagnosis not present

## 2024-06-26 DIAGNOSIS — R509 Fever, unspecified: Secondary | ICD-10-CM | POA: Diagnosis present

## 2024-06-26 DIAGNOSIS — R531 Weakness: Secondary | ICD-10-CM

## 2024-06-26 NOTE — ED Triage Notes (Signed)
 BIB EMS from home.  Pt slipped in the bathroom d/t baseline right leg weakness.  EMS stated they had been out earlier to take her temp and is not febrile now. Does endorse some nasal congestion and dizziness.  VSS.  CBG 114.  Reports only pain is her right wrist where her daughter tried to catch her from falling.  This pain is rated 0-1/10.

## 2024-06-27 ENCOUNTER — Inpatient Hospital Stay (HOSPITAL_COMMUNITY)
Admission: EM | Admit: 2024-06-27 | Discharge: 2024-07-03 | DRG: 193 | Disposition: A | Discharge status: Skilled Nursing Facility | Attending: Internal Medicine | Admitting: Internal Medicine

## 2024-06-27 ENCOUNTER — Emergency Department (HOSPITAL_COMMUNITY)

## 2024-06-27 DIAGNOSIS — J111 Influenza due to unidentified influenza virus with other respiratory manifestations: Principal | ICD-10-CM | POA: Diagnosis present

## 2024-06-27 DIAGNOSIS — I10 Essential (primary) hypertension: Secondary | ICD-10-CM | POA: Diagnosis present

## 2024-06-27 DIAGNOSIS — Z8673 Personal history of transient ischemic attack (TIA), and cerebral infarction without residual deficits: Secondary | ICD-10-CM

## 2024-06-27 DIAGNOSIS — G9341 Metabolic encephalopathy: Secondary | ICD-10-CM | POA: Diagnosis present

## 2024-06-27 DIAGNOSIS — R627 Adult failure to thrive: Secondary | ICD-10-CM

## 2024-06-27 DIAGNOSIS — Z79899 Other long term (current) drug therapy: Secondary | ICD-10-CM

## 2024-06-27 DIAGNOSIS — Z96653 Presence of artificial knee joint, bilateral: Secondary | ICD-10-CM | POA: Diagnosis present

## 2024-06-27 DIAGNOSIS — M810 Age-related osteoporosis without current pathological fracture: Secondary | ICD-10-CM | POA: Diagnosis present

## 2024-06-27 DIAGNOSIS — R5381 Other malaise: Secondary | ICD-10-CM | POA: Diagnosis present

## 2024-06-27 DIAGNOSIS — F32A Depression, unspecified: Secondary | ICD-10-CM | POA: Diagnosis present

## 2024-06-27 DIAGNOSIS — Z803 Family history of malignant neoplasm of breast: Secondary | ICD-10-CM

## 2024-06-27 DIAGNOSIS — Z86718 Personal history of other venous thrombosis and embolism: Secondary | ICD-10-CM

## 2024-06-27 DIAGNOSIS — Z87891 Personal history of nicotine dependence: Secondary | ICD-10-CM

## 2024-06-27 DIAGNOSIS — F419 Anxiety disorder, unspecified: Secondary | ICD-10-CM | POA: Diagnosis present

## 2024-06-27 DIAGNOSIS — E66811 Obesity, class 1: Secondary | ICD-10-CM | POA: Diagnosis present

## 2024-06-27 DIAGNOSIS — Z751 Person awaiting admission to adequate facility elsewhere: Secondary | ICD-10-CM

## 2024-06-27 DIAGNOSIS — Z6831 Body mass index (BMI) 31.0-31.9, adult: Secondary | ICD-10-CM

## 2024-06-27 DIAGNOSIS — J101 Influenza due to other identified influenza virus with other respiratory manifestations: Principal | ICD-10-CM | POA: Diagnosis present

## 2024-06-27 DIAGNOSIS — K219 Gastro-esophageal reflux disease without esophagitis: Secondary | ICD-10-CM | POA: Diagnosis present

## 2024-06-27 LAB — CBC WITH DIFFERENTIAL/PLATELET
Abs Immature Granulocytes: 0.01 K/uL (ref 0.00–0.07)
Abs Immature Granulocytes: 0.02 K/uL (ref 0.00–0.07)
Basophils Absolute: 0 K/uL (ref 0.0–0.1)
Basophils Absolute: 0 K/uL (ref 0.0–0.1)
Basophils Relative: 1 %
Basophils Relative: 0 %
Eosinophils Absolute: 0 K/uL (ref 0.0–0.5)
Eosinophils Absolute: 0 K/uL (ref 0.0–0.5)
Eosinophils Relative: 1 %
Eosinophils Relative: 0 %
HCT: 43.5 % (ref 36.0–46.0)
HCT: 43.4 % (ref 36.0–46.0)
Hemoglobin: 13.7 g/dL (ref 12.0–15.0)
Hemoglobin: 13.5 g/dL (ref 12.0–15.0)
Immature Granulocytes: 0 %
Immature Granulocytes: 0 %
Lymphocytes Relative: 13 %
Lymphocytes Relative: 17 %
Lymphs Abs: 0.8 K/uL (ref 0.7–4.0)
Lymphs Abs: 1 K/uL (ref 0.7–4.0)
MCH: 28.3 pg (ref 26.0–34.0)
MCH: 27.9 pg (ref 26.0–34.0)
MCHC: 31.5 g/dL (ref 30.0–36.0)
MCHC: 31.1 g/dL (ref 30.0–36.0)
MCV: 89.9 fL (ref 80.0–100.0)
MCV: 89.7 fL (ref 80.0–100.0)
Monocytes Absolute: 0.6 K/uL (ref 0.1–1.0)
Monocytes Absolute: 0.5 K/uL (ref 0.1–1.0)
Monocytes Relative: 10 %
Monocytes Relative: 9 %
Neutro Abs: 4.5 K/uL (ref 1.7–7.7)
Neutro Abs: 4.2 K/uL (ref 1.7–7.7)
Neutrophils Relative %: 75 %
Neutrophils Relative %: 74 %
Platelets: 162 K/uL (ref 150–400)
Platelets: 133 K/uL — ABNORMAL LOW (ref 150–400)
RBC: 4.84 MIL/uL (ref 3.87–5.11)
RBC: 4.84 MIL/uL (ref 3.87–5.11)
RDW: 12.8 % (ref 11.5–15.5)
RDW: 12.9 % (ref 11.5–15.5)
WBC: 5.9 K/uL (ref 4.0–10.5)
WBC: 5.6 K/uL (ref 4.0–10.5)
nRBC: 0 % (ref 0.0–0.2)
nRBC: 0 % (ref 0.0–0.2)

## 2024-06-27 LAB — COMPREHENSIVE METABOLIC PANEL WITH GFR
ALT: 11 U/L (ref 0–44)
AST: 22 U/L (ref 15–41)
Albumin: 4.2 g/dL (ref 3.5–5.0)
Alkaline Phosphatase: 126 U/L (ref 38–126)
Anion gap: 11 (ref 5–15)
BUN: 20 mg/dL (ref 8–23)
CO2: 26 mmol/L (ref 22–32)
Calcium: 9.2 mg/dL (ref 8.9–10.3)
Chloride: 100 mmol/L (ref 98–111)
Creatinine, Ser: 0.73 mg/dL (ref 0.44–1.00)
GFR, Estimated: >60 mL/min
Glucose, Bld: 98 mg/dL (ref 70–99)
Potassium: 4 mmol/L (ref 3.5–5.1)
Sodium: 137 mmol/L (ref 135–145)
Total Bilirubin: 0.3 mg/dL (ref 0.0–1.2)
Total Protein: 7.5 g/dL (ref 6.5–8.1)

## 2024-06-27 LAB — RESP PANEL BY RT-PCR (RSV, FLU A&B, COVID)  RVPGX2
Influenza A by PCR: POSITIVE — AB
Influenza B by PCR: NEGATIVE
Resp Syncytial Virus by PCR: NEGATIVE
SARS Coronavirus 2 by RT PCR: NEGATIVE

## 2024-06-27 MED ORDER — ACETAMINOPHEN 500 MG PO TABS
1000.0000 mg | ORAL_TABLET | Freq: Once | ORAL | Status: AC
  Administered 2024-06-27: 1000 mg via ORAL
  Filled 2024-06-27: qty 2

## 2024-06-27 MED ORDER — OSELTAMIVIR PHOSPHATE 75 MG PO CAPS: 75.0000 mg | ORAL_CAPSULE | Freq: Two times a day (BID) | ORAL | 0 refills | Status: DC

## 2024-06-27 NOTE — ED Provider Notes (Signed)
 " [facility] Emergency Department Provider Note MRN:  [id number]  Arrival date & time: 06/28/2024     Chief Complaint   No chief complaint on file.   History of Present Illness   [name] is a 84 y.o. year-old female presents to the ED with chief complaint of generalized weakness.  Was seen yesterday and diagnosed with flu.  States that she is so weak that she can't walk and hasn't been able to take care of herself, walk, or perform ADLs.  Family states that this has been worsening over the past couple of weeks, but got much much worse yesterday and today.  They state that patient has been falling and fell again tonight. .  History provided by patient.   Review of Systems  Pertinent positive and negative review of systems noted in HPI.    Physical Exam   Vitals:   06/27/24 2324 06/28/24 0056  BP:    Pulse:    Resp:    Temp: (!) 101.1 F (38.4 C) 100.1 F (37.8 C)  SpO2:      CONSTITUTIONAL:  non toxic-appearing, NAD NEURO:  Alert and oriented x 3, CN 3-12 grossly intact EYES:  eyes equal and reactive ENT/NECK:  Supple, no stridor  CARDIO:  normal rate, regular rhythm, appears well-perfused  PULM:  No respiratory distress, CTAB GI/GU:  non-distended, no focal tenderness MSK/SPINE:  No gross deformities, no edema, moves all extremities  SKIN:  no rash, atraumatic   *Additional and/or pertinent findings included in MDM below  Diagnostic and Interventional Summary    EKG Interpretation Date/Time:  Saturday June 27 2024 23:21:50 EST Ventricular Rate:  78 PR Interval:  187 QRS Duration:  100 QT Interval:  413 QTC Calculation: 471 R Axis:   64  Text Interpretation: Sinus rhythm Borderline T wave abnormalities Confirmed by Trine Likes [phone] on 06/28/2024 12:34:43 AM       Labs Reviewed  CBC WITH DIFFERENTIAL/PLATELET - Abnormal; Notable for the following components:      Result Value   Platelets 133 (*)    All other  components within normal limits  BASIC METABOLIC PANEL WITH GFR - Abnormal; Notable for the following components:   Calcium  8.8 (*)    All other components within normal limits    CT HEAD WO CONTRAST ( )  Final Result      Medications  acetaminophen  (TYLENOL ) tablet 1,000 mg (1,000 mg Oral Given 06/27/24 2337)     Procedures  /  Critical Care Procedures  ED Course and Medical Decision Making  I have reviewed the triage vital signs, the nursing notes, and pertinent available records from the EMR.  Social Determinants Affecting Complexity of Care: Patient has no clinically significant social determinants affecting this chief complaint..   ED Course:    Medical Decision Making Patient here with recently diagnosed influenza and worsening generalized weakness.  States that she had a fall last night due to weakness and had another fall tonight.  States that her legs give out and she doesn't have any strength.  She and family associate this with the flu, but state that she has had some difficulty fulfilling ADLs for the past couple of weeks.  Amount and/or Complexity of Data Reviewed Labs: ordered. Radiology: ordered. ECG/medicine tests: ordered.  Risk OTC drugs. Decision regarding hospitalization.         Consultants: I consulted with Hospitalist, Dr. Dena, who is appreciated for admitting.   Treatment and Plan: Patient's exam and  diagnostic results are concerning for influenza with recurrent falls 2/2 generalized fatigue.  Feel that patient will need admission to the hospital for further treatment and evaluation.  Patient discussed with attending physician, Dr. Trine, who agrees with plan.  Final Clinical Impressions(s) / ED Diagnoses     ICD-10-CM   1. Influenza  J11.1     2. Failure to thrive in adult  R62.7       ED Discharge Orders     None         Discharge Instructions Discussed with and Provided to Patient:   Discharge Instructions    None      Vicky Charleston, PA-C 06/28/24 0114    Trine Raynell Moder, MD 06/28/24 [phone]  "

## 2024-06-27 NOTE — ED Provider Notes (Signed)
 " [place] EM[facility]GENC[facility] DE[facility]MENT AT [facility] Provider Note  CSN: [id number] Arrival date & time: 06/26/24 2303  Chief Complaint(s) Fall  History provided by patient and relative. HPI & MDM [name] is a 84 y.o. female.   Fall   Clinical Course as of 06/27/24 0233  Sat Jun 27, 2024  0025 Patient has a past medical history listed below including ICH resulting in right lower extremity weakness who presents to the emergency department after a mechanical fall at home.  Due to her  deficits, patient ambulates at home with a walker.  Today she has been feeling generally fatigued attributed to upper respiratory infection which she awoke with this morning.  Patient noted had a fever with EMS when they were called earlier today and has been controlling it with Tylenol .  Patient has been taking over-the-counter decongestant medication for symptoms.  This evening while trying to maneuver back from the bathroom, patient lost her balance and fell onto her left side.  She is complaining of mild left wrist pain.  She denied any head trauma or loss of consciousness.  The fall was witnessed by her daughter who was attempting to help her maneuver out of the bathroom and the brother-in-law who is currently here with the patient.  Patient denies any other injuries related to the fall.   [PC]  0028 On exam, patient does not have any signs of severe traumatic injuries.  She does have left wrist discomfort.  Will obtain an x-ray.  She denies any headache or neck pain requiring advanced imaging.  Patient does have symptoms of upper respiratory infection with nasal congestion and cough.  Will obtain viral panel to assess for COVID, influenza, RSV.  Will also get a chest x-ray and basic labs.  [PC]  0222 CBC without leukocytosis or anemia.  Metabolic panel without electrolyte derangements or renal sufficiency.  Viral panel confirmed influenza A infection.  Chest x-ray without evidence of  pneumonia.  X-ray of the left wrist negative for any acute injuries.  [PC]  0231 Offered Tamiflu  to patient who would like to take it.  [PC]    Clinical Course User Index [PC] Marthena Whitmyer, Raynell Moder, MD   Medical Decision Making Amount and/or Complexity of Data Reviewed Labs: ordered. Decision-making details documented in ED Course. Radiology: ordered and independent interpretation performed. Decision-making details documented in ED Course.  Risk Prescription drug management.    Final Clinical Impression(s) / ED Diagnoses Final diagnoses:  Influenza A  General weakness  Fall in home, initial encounter   The patient appears reasonably screened and/or stabilized for discharge and I doubt any other medical condition or other [facility] requiring further screening, evaluation, or treatment in the ED at this time. I have discussed the findings, Dx and Tx plan with the patient/family who expressed understanding and agree(s) with the plan. Discharge instructions discussed at length. The patient/family was given strict return precautions who verbalized understanding of the instructions. No further questions at time of discharge.  Disposition: Discharge  Condition: Good  ED Discharge Orders          Ordered    oseltamivir  (TAMIFLU ) 75 MG capsule  Every 12 hours        06/27/24 0232              Follow Up: Dwight Trula SQUIBB, MD [address] [place] [address] [phone]  Call  to schedule an appointment for close follow up     Past  Medical History 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/2019   Cystoid macular edema of right eye 09/24/2019   Detrusor instability    GERD (gastroesophageal reflux disease)    Hemorrhagic stroke (HCC)    6/25   Osteoporosis    Psoriasis    Sciatic pain    Uterine prolapse    Patient Active Problem  List   Diagnosis Date Noted   Basal ganglia hemorrhage (HCC) 10/07/2023   ICH (intracerebral hemorrhage) (HCC) 10/04/2023   Cellulitis 08/16/2022   Bilateral lower extremity edema 08/16/2022   GERD (gastroesophageal reflux disease) 08/16/2022   Acute back pain 05/25/2022   Recurrent falls 05/23/2022   Class 1 obesity due to excess calories with body mass index (BMI) of 30.0 to 30.9 in adult 05/23/2022   Advanced nonexudative age-related macular degeneration of right eye with subfoveal involvement 05/04/2021   Early stage nonexudative age-related macular degeneration of left eye 12/01/2020   Nuclear sclerotic cataract of left eye 02/25/2020   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   Exudative age-related macular degeneration of right eye with inactive choroidal neovascularization (HCC) 09/24/2019   Retinal hemorrhage of right eye 09/24/2019   Retinal exudates and deposits 09/24/2019   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   Mastodynia, female 03/19/2016   Cystocele 02/08/2014   Vaginal atrophy 01/28/2014   History of herpes simplex infection 01/28/2014   Knee pain, acute 03/05/2013   Anxiety and depression 02/05/2013   Constipation 02/05/2013   Psoriasis 02/05/2013   Failed total knee arthroplasty 01/23/2013   Sciatic pain    Detrusor instability    Uterine prolapse    Atrophic vaginitis    Osteoporosis    Home Medication(s) Prior to Admission medications  Medication Sig Start Date End Date Taking? Authorizing Provider  oseltamivir  (TAMIFLU ) 75 MG capsule Take 1 capsule (75 mg total) by mouth every 12 (twelve) hours. 06/27/24  Yes Sharnelle Cappelli, Raynell Moder, MD  acetaminophen  (TYLENOL ) 325 MG tablet Take 2 tablets (650 mg total) by mouth every 6 (six) hours as needed for mild pain (or Fever >/= 101). 05/27/22   Krishnan, Gokul, MD  Calcium  Carb-Cholecalciferol  (CALCIUM  + VITAMIN D3 PO) Take 1 tablet by mouth daily.    [provider]  cetirizine  (ZYRTEC) 10 MG tablet Take 10 mg by mouth daily as needed for allergies.    [provider]  FLUoxetine  (PROZAC  WEEKLY) 90 MG DR capsule Oral 04/03/18   [provider]  FLUoxetine  (PROZAC ) 20 MG tablet Take 60 mg by mouth in the morning.    [provider]  furosemide  (LASIX ) 20 MG tablet Oral; Duration: 90 Days    [provider]  furosemide  (LASIX ) 40 MG tablet Take 1 tablet (40 mg total) by mouth daily. 10/08/23   Jillian Buttery, MD  LORazepam  (ATIVAN ) 0.5 MG tablet Take 1 tablet (0.5 mg total) by mouth daily as needed for anxiety or sleep. 10/07/23   Jillian Buttery, MD  losartan -hydrochlorothiazide  (HYZAAR) 50-12.5 MG tablet Take 1 tablet by mouth daily. 10/26/23   [provider]  metroNIDAZOLE  (FLAGYL ) 500 MG tablet Take 1 tablet (500 mg total) by mouth 2 (two) times daily. 02/07/24   Prentiss Riggs A, NP  polyethylene glycol (MIRALAX  / GLYCOLAX ) 17 g packet Take 17 g by mouth daily as needed. 10/07/23   Jillian Buttery, MD  potassium chloride  SA (KLOR-CON  M) 20  MEQ tablet Take 1 tablet (20 mEq total) by mouth daily. 10/07/23   Jillian Buttery, MD  senna-docusate (SENOKOT-S) 8.6-50 MG tablet Take 2 tablets by mouth 2 (two) times daily. Patient not taking: Reported on 02/07/2024 05/27/22   Verdene Purchase, MD  TYLENOL  8 HOUR ARTHRITIS PAIN 650 MG CR tablet Take 1,300 mg by mouth every 8 (eight) hours as needed for pain.    [provider]  valACYclovir  (VALTREX ) 500 MG tablet Take 1 tablet (500 mg total) by mouth every other day. 10/30/22   Prentiss Annabella LABOR, NP                                                                                                                                    Allergies Other  Review of Systems Review of Systems As noted in HPI  Physical Exam Vital Signs  I have reviewed the triage vital signs BP 138/64 (BP Location: Left Arm)   Pulse 73   Temp 98.7 F (37.1 C) (Oral)   Resp 18   SpO2 93%    Physical Exam Constitutional:      General: She is not in acute distress.    Appearance: She is well-developed. She is not diaphoretic.  HENT:     Head: Normocephalic and atraumatic.     Right Ear: External ear normal.     Left Ear: External ear normal.     Nose: Congestion present.  Eyes:     General: No scleral icterus.       Right eye: No discharge.        Left eye: No discharge.     Conjunctiva/sclera: Conjunctivae normal.     Pupils: Pupils are equal, round, and reactive to light.  Cardiovascular:     Rate and Rhythm: Normal rate and regular rhythm.     Pulses:          Radial pulses are 2+ on the right side and 2+ on the left side.       Dorsalis pedis pulses are 2+ on the right side and 2+ on the left side.     Heart sounds: Normal heart sounds. No murmur heard.    No friction rub. No gallop.  Pulmonary:     Effort: Pulmonary effort is normal. No respiratory distress.     Breath sounds: Normal breath sounds. No stridor. No wheezing, rhonchi or rales.  Abdominal:     General: There is no distension.     Palpations: Abdomen is soft.     Tenderness: There is no abdominal tenderness.  Musculoskeletal:        General: No tenderness.     Cervical back: Normal range of motion and neck supple. No bony tenderness.     Thoracic back: No bony tenderness.     Lumbar back: No bony tenderness.     Comments: Clavicles stable. Chest stable to AP/Lat compression. Pelvis stable to Lat compression.  No obvious extremity deformity. No chest or abdominal wall contusion.  Skin:    General: Skin is warm and dry.     Findings: No erythema or rash.  Neurological:     Mental Status: She is alert and oriented to person, place, and time.     Comments: Moving all extremities     ED Results and Treatments Labs (all labs ordered are listed, but only abnormal results are displayed) Labs Reviewed  RESP PANEL BY RT-PCR (RSV, FLU A&B, COVID)  [id number] - Abnormal; Notable for the following  components:      Result Value   Influenza A by PCR POSITIVE (*)    All other components within normal limits  CBC WITH DIFFERENTIAL/PLATELET  COMPREHENSIVE METABOLIC PANEL WITH GFR                                                                                                                         EKG  EKG Interpretation Date/Time:    Ventricular Rate:    PR Interval:    QRS Duration:    QT Interval:    QTC Calculation:   R Axis:      Text Interpretation:         Radiology DG Wrist Complete Left Result Date: 06/27/2024 EXAM: 3 or more VIEW(S) XRAY OF THE LEFT WRIST 06/27/2024 01:02:42 AM COMPARISON: None available. CLINICAL HISTORY: Fall FINDINGS: BONES AND JOINTS: No acute fracture. Diffusely decreased bone density. Mild degenerative changes of the Triscaphe joint. SOFT TISSUES: Vascular calcifications. IMPRESSION: 1. No evidence of acute traumatic injury. Electronically signed by: Morgane Naveau MD MD 06/27/2024 01:07 AM EST RP Workstation: [id number]   DG Chest 2 View Result Date: 06/27/2024 EXAM: 2 VIEW(S) XRAY OF THE CHEST 06/27/2024 01:02:42 AM COMPARISON: None available. CLINICAL HISTORY: Cough FINDINGS: LUNGS AND PLEURA: Low lung volume. No focal pulmonary opacity. No pleural effusion. No pneumothorax. HEART AND MEDIASTINUM: Aortic arch calcifications. No acute abnormality of the cardiac and mediastinal silhouettes. BONES AND SOFT TISSUES: Severe degenerative changes of left shoulder. Likely right shoulder calcific tendinosis. No acute osseous abnormality. IMPRESSION: 1. Low lung volumes. No acute cardiopulmonary findings. Electronically signed by: Morgane Naveau MD MD 06/27/2024 01:06 AM EST RP Workstation: [id number]    Medications Ordered in ED Medications - No data to display Procedures Procedures  (including critical care time)   This chart was dictated using voice recognition software.  Despite best efforts to proofread,  errors can occur which can change the  documentation meaning.   Trine Raynell Moder, MD 06/27/24 [phone]  "

## 2024-06-27 NOTE — ED Triage Notes (Signed)
 Pt BIB [facility] from home. Pt had a fall today. Hx chronic leg weakness. Pt reports sliding down bathroom door. Denies hitting her head. Not on thinners.  162/72 96 91% RA

## 2024-06-28 ENCOUNTER — Encounter (HOSPITAL_COMMUNITY): Payer: Self-pay | Admitting: Internal Medicine

## 2024-06-28 ENCOUNTER — Other Ambulatory Visit: Payer: Self-pay

## 2024-06-28 ENCOUNTER — Emergency Department (HOSPITAL_COMMUNITY)

## 2024-06-28 DIAGNOSIS — J111 Influenza due to unidentified influenza virus with other respiratory manifestations: Secondary | ICD-10-CM | POA: Diagnosis not present

## 2024-06-28 LAB — BASIC METABOLIC PANEL WITH GFR
Anion gap: 11 (ref 5–15)
BUN: 15 mg/dL (ref 8–23)
CO2: 25 mmol/L (ref 22–32)
Calcium: 8.8 mg/dL — ABNORMAL LOW (ref 8.9–10.3)
Chloride: 101 mmol/L (ref 98–111)
Creatinine, Ser: 0.75 mg/dL (ref 0.44–1.00)
GFR, Estimated: >60 mL/min
Glucose, Bld: 95 mg/dL (ref 70–99)
Potassium: 4.2 mmol/L (ref 3.5–5.1)
Sodium: 137 mmol/L (ref 135–145)

## 2024-06-28 MED ORDER — ACETAMINOPHEN 650 MG RE SUPP: 650.0000 mg | Freq: Four times a day (QID) | RECTAL | Status: DC | PRN

## 2024-06-28 MED ORDER — HYDROCHLOROTHIAZIDE 12.5 MG PO TABS
12.5000 mg | ORAL_TABLET | Freq: Every day | ORAL | Status: DC
  Filled 2024-06-28: qty 1

## 2024-06-28 MED ORDER — PHENOL 1.4 % MT LIQD: 1.0000 | OROMUCOSAL | Status: DC | PRN

## 2024-06-28 MED ORDER — LORAZEPAM 0.5 MG PO TABS
0.5000 mg | ORAL_TABLET | Freq: Every day | ORAL | Status: DC | PRN
  Administered 2024-06-30: 0.5 mg via ORAL
  Filled 2024-06-28: qty 1

## 2024-06-28 MED ORDER — ONDANSETRON HCL 4 MG PO TABS: 4.0000 mg | ORAL_TABLET | Freq: Four times a day (QID) | ORAL | Status: DC | PRN

## 2024-06-28 MED ORDER — LACTATED RINGERS IV BOLUS
1000.0000 mL | Freq: Once | INTRAVENOUS | Status: AC
  Administered 2024-06-28: 1000 mL via INTRAVENOUS

## 2024-06-28 MED ORDER — GUAIFENESIN-DM 100-10 MG/5ML PO SYRP
5.0000 mL | ORAL_SOLUTION | ORAL | Status: DC | PRN
  Administered 2024-06-28: 5 mL via ORAL
  Filled 2024-06-28: qty 5

## 2024-06-28 MED ORDER — VALACYCLOVIR HCL 500 MG PO TABS
500.0000 mg | ORAL_TABLET | ORAL | Status: DC
  Administered 2024-06-28 – 2024-07-02 (×3): 500 mg via ORAL
  Filled 2024-06-28 (×3): qty 1

## 2024-06-28 MED ORDER — FLUOXETINE HCL 20 MG PO CAPS
60.0000 mg | ORAL_CAPSULE | Freq: Every day | ORAL | Status: DC
  Administered 2024-06-28 – 2024-07-03 (×6): 60 mg via ORAL
  Filled 2024-06-28 (×6): qty 3

## 2024-06-28 MED ORDER — FUROSEMIDE 40 MG PO TABS
40.0000 mg | ORAL_TABLET | Freq: Every day | ORAL | Status: DC
  Administered 2024-06-28 – 2024-07-03 (×6): 40 mg via ORAL
  Filled 2024-06-28 (×6): qty 1

## 2024-06-28 MED ORDER — ONDANSETRON HCL 4 MG/2ML IJ SOLN: 4.0000 mg | Freq: Four times a day (QID) | INTRAMUSCULAR | Status: DC | PRN

## 2024-06-28 MED ORDER — ENSURE PLUS HIGH PROTEIN PO LIQD
237.0000 mL | Freq: Two times a day (BID) | ORAL | Status: DC
  Administered 2024-06-28 – 2024-07-02 (×4): 237 mL via ORAL

## 2024-06-28 MED ORDER — ENOXAPARIN SODIUM 40 MG/0.4ML IJ SOSY
40.0000 mg | PREFILLED_SYRINGE | INTRAMUSCULAR | Status: DC
  Administered 2024-06-28 – 2024-07-03 (×6): 40 mg via SUBCUTANEOUS
  Filled 2024-06-28 (×6): qty 0.4

## 2024-06-28 MED ORDER — LOSARTAN POTASSIUM-HCTZ 50-12.5 MG PO TABS: 1.0000 | ORAL_TABLET | Freq: Every day | ORAL | Status: DC

## 2024-06-28 MED ORDER — ACETAMINOPHEN 325 MG PO TABS
650.0000 mg | ORAL_TABLET | Freq: Four times a day (QID) | ORAL | Status: DC | PRN
  Administered 2024-06-28: 650 mg via ORAL
  Filled 2024-06-28: qty 2

## 2024-06-28 MED ORDER — LOSARTAN POTASSIUM 25 MG PO TABS
50.0000 mg | ORAL_TABLET | Freq: Every day | ORAL | Status: DC
  Administered 2024-06-28 – 2024-07-03 (×6): 50 mg via ORAL
  Filled 2024-06-28 (×6): qty 2

## 2024-06-28 MED ORDER — ORAL CARE MOUTH RINSE: 15.0000 mL | OROMUCOSAL | Status: DC | PRN

## 2024-06-28 NOTE — Evaluation (Signed)
 Physical Therapy Evaluation Patient Details Name: [name] MRN: [id number] DOB: 19-Jan-1941 Today's Date: 06/28/2024  History of Present Illness  84 y.o. female who presents to Emergency Department on 06/27/24 with generalized weakness.  Patient was seen yesterday and diagnosed with the flu.  PMH - anxiety, neck pain, achilles rupture, TKR x 2, revision TKR, osteoporosis, macular degeneration, chronic lymphedema, L basal ganglia hemorrhage  Clinical Impression  Pt admitted with above diagnosis.  Pt currently with functional limitations due to the deficits listed below (see PT Problem List). Pt will benefit from acute skilled PT to increase their independence and safety with mobility to allow discharge.  Pt reports feeling very weak and uncertain of mobility.  Pt did attempt OOB however not even able to fully stand in upright posture or transfer to recliner.  Pt assisted back to bed.  Pt anticipates d/c to SNF (she is typically able to ambulate at least short distances with RW in home) and requests [facility].         If plan is discharge home, recommend the following: Two people to help with walking and/or transfers;Two people to help with bathing/dressing/bathroom;Assistance with cooking/housework;Help with stairs or ramp for entrance;Assist for transportation   Can travel by private vehicle   No    Equipment Recommendations None recommended by PT  Recommendations for Other Services       Functional Status Assessment Patient has had a recent decline in their functional status and demonstrates the ability to make significant improvements in function in a reasonable and predictable amount of time.     Precautions / Restrictions Precautions Precautions: Fall      Mobility  Bed Mobility Overal bed mobility: Needs Assistance Bed Mobility: Supine to Sit, Sit to Supine     Supine to sit: Max assist, HOB elevated, Used rails Sit to supine: Total assist, +2 for physical  assistance   General bed mobility comments: increased time and effort, cues for self assist as able,  assist for scooting and upper body, more assist for return to bed and repositioning - fatigued quickly    Transfers Overall transfer level: Needs assistance Equipment used: 1 person hand held assist Transfers: Sit to/from Stand Sit to Stand: Mod assist           General transfer comment: verbal cues for technique, able to lift buttocks from bed however not perform full upright trunk posture and returned to sitting, attempted to stand again however pt unable    Ambulation/Gait                  Stairs            Wheelchair Mobility     Tilt Bed    Modified Rankin (Stroke Patients Only)       Balance Overall balance assessment: History of Falls                                           Pertinent Vitals/Pain Pain Assessment Pain Assessment: Faces Faces Pain Scale: Hurts little more Pain Location: everywhere Pain Descriptors / Indicators: Sore, Aching Pain Intervention(s): Monitored during session, Repositioned    Home Living Family/patient expects to be discharged to:: Private residence Living Arrangements: Spouse/significant other Available Help at Discharge: Family;Available 24 hours/day Type of Home: House Home Access: Stairs to enter Entrance Stairs-Rails: Right;Left;Can reach both Entrance Stairs-Number of Steps: 2  Home Layout: Able to live on main level with bedroom/bathroom Home Equipment: Rollator (4 wheels);Rolling Walker (2 wheels);BSC/3in1;Shower seat Additional Comments: stays on main level and sleeps in recliner (for the past 2 years per pt)    Prior Function Prior Level of Function : History of Falls (last six months);Needs assist             Mobility Comments: pt sleeps in lift chair, ambulates with RW for short distances in home.       Extremity/Trunk Assessment        Lower Extremity  Assessment Lower Extremity Assessment: Generalized weakness (chronic lymphedema)    Cervical / Trunk Assessment Cervical / Trunk Assessment: Normal  Communication   Communication Communication: No apparent difficulties    Cognition Arousal: Alert Behavior During Therapy: WFL for tasks assessed/performed   PT - Cognitive impairments: No apparent impairments                         Following commands: Intact       Cueing Cueing Techniques: Verbal cues, Tactile cues     General Comments      Exercises     Assessment/Plan    PT Assessment Patient needs continued PT services  PT Problem List Decreased strength;Decreased mobility;Decreased activity tolerance;Decreased balance;Decreased knowledge of use of DME       PT Treatment Interventions Gait training;DME instruction;Therapeutic exercise;Functional mobility training;Therapeutic activities;Patient/family education;Balance training    PT Goals (Current goals can be found in the Care Plan section)  Acute Rehab PT Goals PT Goal Formulation: With patient Time For Goal Achievement: 07/12/24 Potential to Achieve Goals: Good    Frequency Min 2X/week     Co-evaluation               AM-PAC PT 6 Clicks Mobility  Outcome Measure Help needed turning from your back to your side while in a flat bed without using bedrails?: A Lot Help needed moving from lying on your back to sitting on the side of a flat bed without using bedrails?: Total Help needed moving to and from a bed to a chair (including a wheelchair)?: Total Help needed standing up from a chair using your arms (e.g., wheelchair or bedside chair)?: Total Help needed to walk in hospital room?: Total Help needed climbing 3-5 steps with a railing? : Total 6 Click Score: 7    End of Session Equipment Utilized During Treatment: Gait belt Activity Tolerance: Patient limited by fatigue Patient left: in bed;with call bell/phone within reach;with bed  alarm set Nurse Communication: Mobility status PT Visit Diagnosis: Muscle weakness (generalized) (M62.81);Difficulty in walking, not elsewhere classified (R26.2)    Time: 1100-1117 PT Time Calculation (min) (ACUTE ONLY): 17 min   Charges:   PT Evaluation $PT Eval Low Complexity: 1 Low   PT General Charges $$ ACUTE PT VISIT: 1 Visit       Tari PT, DPT Physical Therapist Acute Rehabilitation Services Office: [phone]   Kati L Payson 06/28/2024, 12:01 PM

## 2024-06-28 NOTE — Progress Notes (Signed)
 [facility] PLAN OF CARE NOTE Patient: [name] FMW:[id number]   PCP: Dwight Trula SQUIBB, MD DOB: 09-Oct-1940   DOA: 06/27/2024   DOS: 06/28/2024    Patient was admitted by my colleague earlier on 06/28/2024. I have reviewed the H&P as well as assessment and plan and agree with the same. Important changes in the plan are listed below.  Plan of care: Principal Problem:   Influenza PT recommending SNF for now. Monitor for improvement in deconditioning.  Level of care: Med-Surg  Author: Yetta Blanch, MD  [facility] 06/28/2024 2:44 PM   If 7PM-7AM, please contact night-coverage at [email or web address]

## 2024-06-28 NOTE — Plan of Care (Signed)

## 2024-06-28 NOTE — H&P (Signed)
 " History and Physical    [name] FMW:[id number] DOB: [birth year] DOA: 06/27/2024  PCP: Dwight Trula SQUIBB, MD   Chief Complaint: Weakness  HPI: [name] is a 84 y.o. female with medical history significant of hypertension, GERD who presents Emergency Department generalized weakness.  Patient was seen yesterday and diagnosed with the flu.  She went home and it was so weak she could not walk so she called EMS and was transported to the ER for further assessment.  On arrival she was afebrile here dynamically stable.  Labs were obtained which showed positive flu, WBC 5.9, hemoglobin 13.7, CMP unrevealing.  Patient underwent CT head which showed no acute findings.  X-ray of wrist showed no acute findings.  Chest x-ray showed no acute findings.  Patient was admitted for further workup.  She was slow to speech however alert and oriented.  She was febrile but denied other complaints   Review of Systems: Review of Systems  Constitutional:  Positive for chills, fever and malaise/fatigue.  All other systems reviewed and are negative.    As per HPI otherwise 10 point review of systems negative.   Allergies[1]  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/2019   Cystoid macular edema of right eye 09/24/2019   Detrusor instability    GERD (gastroesophageal reflux disease)    Hemorrhagic stroke (HCC)    6/25   Osteoporosis    Psoriasis    Sciatic pain    Uterine prolapse     Past Surgical History:  Procedure Laterality Date   APPENDECTOMY     FOOT SURGERY     KNEE SURGERY     TOTAL KNEE ARTHROPLASTY Bilateral    X2-- 2009; 2010   TOTAL KNEE REVISION Left 01/23/2013   Procedure:  TOTAL KNEE ARTHROPLASTY REVISION ;  Surgeon: Dempsey LULLA Moan, MD;  Location: [facility];  Service: Orthopedics;  Laterality: Left;     reports that she quit smoking about 8 years ago. Her  smoking use included cigarettes. She smoked an average of 0.2 packs per day. She has never used smokeless tobacco. She reports that she does not drink alcohol and does not use drugs.  Family History  Problem Relation Age of Onset   Cancer Mother        GALLBLADDER    Breast cancer Cousin        Mat. 1st cousin Age 102    Prior to Admission medications  Medication Sig Start Date End Date Taking? Authorizing Provider  FLUoxetine  (PROZAC ) 20 MG tablet Take 60 mg by mouth in the morning.   Yes [provider]  furosemide  (LASIX ) 40 MG tablet Take 1 tablet (40 mg total) by mouth daily. 10/08/23  Yes Jillian Buttery, MD  LORazepam  (ATIVAN ) 0.5 MG tablet Take 1 tablet (0.5 mg total) by mouth daily as needed for anxiety or sleep. 10/07/23  Yes Jillian Buttery, MD  losartan -hydrochlorothiazide  (HYZAAR) 50-12.5 MG tablet Take 1 tablet by mouth daily. 10/26/23  Yes [provider]  potassium chloride  (KLOR-CON ) 10 MEQ tablet Take 10 mEq by mouth daily. 06/07/24  Yes [provider]  TYLENOL  8 HOUR ARTHRITIS PAIN 650 MG CR tablet Take 1,300 mg by mouth every 8 (eight) hours as needed for pain.   Yes [provider]  valACYclovir  (VALTREX ) 500 MG tablet Take 1 tablet (500 mg total)  by mouth every other day. 10/30/22  Yes Prentiss Riggs A, NP  acetaminophen  (TYLENOL ) 325 MG tablet Take 2 tablets (650 mg total) by mouth every 6 (six) hours as needed for mild pain (or Fever >/= 101). Patient not taking: Reported on 06/27/2024 05/27/22   Krishnan, Gokul, MD  Calcium  Carb-Cholecalciferol  (CALCIUM  + VITAMIN D3 PO) Take 1 tablet by mouth daily. Patient not taking: Reported on 06/27/2024    [provider]  cetirizine (ZYRTEC) 10 MG tablet Take 10 mg by mouth daily as needed for allergies. Patient not taking: Reported on 06/27/2024    [provider]  FLUoxetine  (PROZAC  WEEKLY) 90 MG DR capsule Oral Patient not taking: Reported on 06/27/2024 04/03/18   [provider]  furosemide  (LASIX ) 20 MG tablet Oral; Duration: 90 Days Patient not taking: Reported on 06/27/2024    [provider]  metroNIDAZOLE  (FLAGYL ) 500 MG tablet Take 1 tablet (500 mg total) by mouth 2 (two) times daily. Patient not taking: Reported on 06/27/2024 02/07/24   Prentiss Riggs A, NP  oseltamivir  (TAMIFLU ) 75 MG capsule Take 1 capsule (75 mg total) by mouth every 12 (twelve) hours. Patient not taking: Reported on 06/27/2024 06/27/24   Trine Raynell Moder, MD  polyethylene glycol (MIRALAX  / GLYCOLAX ) 17 g packet Take 17 g by mouth daily as needed. 10/07/23   Adhikari, Amrit, MD  potassium chloride  SA (KLOR-CON  M) 20 MEQ tablet Take 1 tablet (20 mEq total) by mouth daily. Patient not taking: Reported on 06/27/2024 10/07/23   Jillian Buttery, MD  senna-docusate (SENOKOT-S) 8.6-50 MG tablet Take 2 tablets by mouth 2 (two) times daily. Patient not taking: No sig reported 05/27/22   Verdene Purchase, MD    Physical Exam: Vitals:   06/27/24 2324 06/28/24 0056 06/28/24 0130 06/28/24 0320  BP:   (!) 147/57   Pulse:   72   Resp:   20   Temp: (!) 101.1 F (38.4 C) 100.1 F (37.8 C)    TempSrc: Oral Oral    SpO2:   95%   Weight:    94.9 kg  Height:    5' 8 (1.727 m)   Physical Exam Constitutional:      Appearance: She is normal weight.  HENT:     Head: Normocephalic.     Mouth/Throat:     Mouth: Mucous membranes are moist.     Pharynx: Oropharynx is clear.  Eyes:     Conjunctiva/sclera: Conjunctivae normal.     Pupils: Pupils are equal, round, and reactive to light.  Cardiovascular:     Rate and Rhythm: Normal rate and regular rhythm.     Pulses: Normal pulses.     Heart sounds: Normal heart sounds.  Pulmonary:     Effort: Pulmonary effort is normal.     Breath sounds: Normal breath sounds.  Abdominal:     General: Bowel sounds are normal.  Musculoskeletal:        General: Normal range of motion.     Cervical back: Normal range of motion.  Skin:     General: Skin is warm.     Capillary Refill: Capillary refill takes less than 2 seconds.  Neurological:     General: No focal deficit present.     Mental Status: She is alert. Mental status is at baseline.  Psychiatric:        Mood and Affect: Mood normal.        Labs on Admission: I have personally reviewed the patients's labs and imaging  studies.  Assessment/Plan Principal Problem:   Influenza    # Influenza # Acute metabolic encephalopathy - Patient was slow to speech and slightly altered - Weak and fatigued most likely related to flu and Plan: Continue IV fluids  # Depression-continue Prozac   # Hypertension-continue hydrochlorothiazide , losartan   # History of DVT-previously on Eliquis.  Will continue to monitor  Admission status: Observation Med-Surg  Certification: The appropriate patient status for this patient is OBSERVATION. Observation status is judged to be reasonable and necessary in order to provide the required intensity of service to ensure the patient's safety. The patient's presenting symptoms, physical exam findings, and initial radiographic and laboratory data in the context of their medical condition is felt to place them at decreased risk for further clinical deterioration. Furthermore, it is anticipated that the patient will be medically stable for discharge from the hospital within 2 midnights of admission.     Lamar Dess MD [facility] If 7PM-7AM, please contact night-coverage [email or web address]  06/28/2024, 3:38 AM        [1]  Allergies Allergen Reactions   Other Itching and Other (See Comments)    Pollen = Itchy eyes/runny nose   "

## 2024-06-28 NOTE — Care Management Obs Status (Signed)
 MEDICARE OBSERVATION STATUS NOTIFICATION   Patient Details  Name: [name] MRN: [id number] Date of Birth: January 22, 1941   Medicare Observation Status Notification Given:  Yes    Sonda Manuella Quill, RN 06/28/2024, 3:48 PM

## 2024-06-29 DIAGNOSIS — J111 Influenza due to unidentified influenza virus with other respiratory manifestations: Secondary | ICD-10-CM | POA: Diagnosis not present

## 2024-06-29 MED ORDER — LOPERAMIDE HCL 2 MG PO CAPS
2.0000 mg | ORAL_CAPSULE | ORAL | Status: DC | PRN
  Administered 2024-06-29 – 2024-07-01 (×3): 2 mg via ORAL
  Filled 2024-06-29 (×3): qty 1

## 2024-06-29 NOTE — TOC Initial Note (Signed)
 Transition of Care [facility]) - Initial/Assessment Note    Patient Details  Name: [name] MRN: [id number] Date of Birth: 06-05-1941  Transition of Care [facility]) CM/SW Contact:    Brittany Glenys DASEN, RN Phone Number: 06/29/2024, 3:46 PM  Clinical Narrative:                 CM spoke with [facility] Daughter, [phone]. Agreeable to SNF work up. Waiting on bed offers.  Expected Discharge Plan: Skilled Nursing Facility Barriers to Discharge: Continued Medical Work up, SNF Pending bed offer   Patient Goals and CMS Choice Patient states their goals for this hospitalization and ongoing recovery are:: SNF CMS Medicare.gov Compare Post Acute Care list provided to:: Patient Represenative (must comment) Choice offered to / list presented to : Patient, HC POA / Guardian [name]) [place] ownership interest in [facility]provided to:: [facility] POA / Guardian    Expected Discharge Plan and Services In-house Referral: NA Discharge Planning Services: CM Consult   Living arrangements for the past 2 months: Single Family Home                 DME Arranged: N/A DME Agency: NA       HH Arranged: NA HH Agency: NA        Prior Living Arrangements/Services Living arrangements for the past 2 months: Single Family Home Lives with:: Spouse Patient language and need for interpreter reviewed:: Yes Do you feel safe going back to the place where you live?: Yes      Need for Family Participation in Patient Care: Yes (Comment) Care giver support system in place?: Yes (comment) Current home services: DME (rollator, WC, chair lift, BCS,)    Activities of Daily Living   ADL Screening (condition at time of admission) Independently performs ADLs?: Yes (appropriate for developmental age) Is the patient deaf or have difficulty hearing?: No Does the patient have difficulty seeing, even when wearing glasses/contacts?: No Does the patient have difficulty concentrating, remembering, or  making decisions?: No  Permission Sought/Granted Permission sought to share information with : Case Manager Permission granted to share information with : Yes, Verbal Permission Granted  Share Information with NAME: [name]  Daughter, Emergency Contact  [phone]  Permission granted to share info w AGENCY: SNF        Emotional Assessment Appearance:: Appears stated age Attitude/Demeanor/Rapport: Gracious Affect (typically observed): Appropriate Orientation: : Oriented to Self, Oriented to Place, Oriented to  Time, Oriented to Situation Alcohol / Substance Use: Not Applicable Psych Involvement: No (comment)  Admission diagnosis:  Failure to thrive in adult [R62.7] Influenza [J11.1] Patient Active Problem List   Diagnosis Date Noted   Influenza 06/28/2024   Basal ganglia hemorrhage (HCC) 10/07/2023   ICH (intracerebral hemorrhage) (HCC) 10/04/2023   Cellulitis 08/16/2022   Bilateral lower extremity edema 08/16/2022   GERD (gastroesophageal reflux disease) 08/16/2022   Acute back pain 05/25/2022   Recurrent falls 05/23/2022   Class 1 obesity due to excess calories with body mass index (BMI) of 30.0 to 30.9 in adult 05/23/2022   Advanced nonexudative age-related macular degeneration of right eye with subfoveal involvement 05/04/2021   Early stage nonexudative age-related macular degeneration of left eye 12/01/2020   Nuclear sclerotic cataract of left eye 02/25/2020   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   Exudative age-related macular degeneration of right eye with inactive choroidal neovascularization (HCC) 09/24/2019   Retinal hemorrhage of right eye 09/24/2019   Retinal exudates and deposits 09/24/2019   Choroidal retinal neovascularization  09/24/2019   Mastodynia, female 03/19/2016   Cystocele 02/08/2014   Vaginal atrophy 01/28/2014   History of herpes simplex infection 01/28/2014   Knee pain, acute 03/05/2013   Anxiety and depression 02/05/2013    Constipation 02/05/2013   Psoriasis 02/05/2013   Failed total knee arthroplasty 01/23/2013   Sciatic pain    Detrusor instability    Uterine prolapse    Atrophic vaginitis    Osteoporosis    PCP:  Brittany Trula SQUIBB, MD Pharmacy:   [facility] [phone] [name], [place] - [address] AT [facility] OF [name] [place] [address] Phone: [phone] Fax: [phone]     Social Drivers of Health (SDOH) Social History: SDOH Screenings   Food Insecurity: No Food Insecurity (06/28/2024)  Housing: Low Risk (06/28/2024)  Transportation Needs: No Transportation Needs (06/28/2024)  Utilities: Not At Risk (06/28/2024)  Social Connections: Socially Integrated (06/28/2024)  Tobacco Use: Medium Risk (06/28/2024)   SDOH Interventions:     Readmission Risk Interventions     No data to display

## 2024-06-29 NOTE — Progress Notes (Signed)
 [facility] Progress Note Patient: [name] FMW:[id number] DOB: 09-30-1940  DOA: 06/27/2024 DOS: the patient was seen and examined on 06/29/2024  Brief Hospital Course: PMH of HTN, GERD presented to the hospital with complaint of generalized fatigue and weakness. Currently being treated for flu. Assessment and Plan: Influenza. Acute metabolic encephalopathy. Generalized weakness and deconditioning. Received with IV fluid. Flu positive. Treated treated with Tamiflu . Respiratory rate improving. Cough improving. Monitor for now.  Deconditioning. PT OT seeing the patient. SNF recommended. Currently being worked up.  HTN. Blood pressure is somewhat stable. On losartan /HCTZ at home as well as Lasix  at home. Currently only continue Lasix  for now.  Chronic use of Valtrex . For now we will continue.  Anxiety. On Prozac . On Ativan . For now continue both.  Obesity Class 1 Body mass index is 31.81 kg/m.  Placing the pt at higher risk of poor outcomes.  Subjective: Denies any acute complaint.  Breathing better.  Cough still present.  Physical Exam: Faint basal crackles. S1-S2 present Bowels and bladder Chronic edema  Data Reviewed: I have Reviewed nursing notes, Vitals, and Lab results. Disposition: Status is: Observation  enoxaparin  (LOVENOX ) injection 40 mg Start: 06/28/24 1000 SCDs Start: 06/28/24 0148   Family Communication: No one at bedside Level of care: Med-Surg   Vitals:   06/28/24 1512 06/28/24 2014 06/29/24 0448 06/29/24 1240  BP: 139/61 (!) 134/57 (!) 131/56 (!) 140/80  Pulse: 68 66 65 67  Resp: 15 18 18 18   Temp: 98.8 F (37.1 C) 98.2 F (36.8 C) 98.1 F (36.7 C) 98.6 F (37 C)  TempSrc: Oral Oral Oral Oral  SpO2: 94% 91% 92% 91%  Weight:      Height:         Author: Yetta Blanch, MD 06/29/2024 6:37 PM  Please look on [email or web address] to find out who is on call.

## 2024-06-30 DIAGNOSIS — I10 Essential (primary) hypertension: Secondary | ICD-10-CM | POA: Diagnosis present

## 2024-06-30 DIAGNOSIS — Z6831 Body mass index (BMI) 31.0-31.9, adult: Secondary | ICD-10-CM | POA: Diagnosis not present

## 2024-06-30 DIAGNOSIS — E66811 Obesity, class 1: Secondary | ICD-10-CM | POA: Diagnosis present

## 2024-06-30 DIAGNOSIS — G9341 Metabolic encephalopathy: Secondary | ICD-10-CM | POA: Diagnosis present

## 2024-06-30 DIAGNOSIS — F32A Depression, unspecified: Secondary | ICD-10-CM | POA: Diagnosis present

## 2024-06-30 DIAGNOSIS — Z87891 Personal history of nicotine dependence: Secondary | ICD-10-CM | POA: Diagnosis not present

## 2024-06-30 DIAGNOSIS — J111 Influenza due to unidentified influenza virus with other respiratory manifestations: Secondary | ICD-10-CM | POA: Diagnosis not present

## 2024-06-30 DIAGNOSIS — M810 Age-related osteoporosis without current pathological fracture: Secondary | ICD-10-CM | POA: Diagnosis present

## 2024-06-30 DIAGNOSIS — R531 Weakness: Secondary | ICD-10-CM | POA: Diagnosis present

## 2024-06-30 DIAGNOSIS — Z8673 Personal history of transient ischemic attack (TIA), and cerebral infarction without residual deficits: Secondary | ICD-10-CM | POA: Diagnosis not present

## 2024-06-30 DIAGNOSIS — Z86718 Personal history of other venous thrombosis and embolism: Secondary | ICD-10-CM | POA: Diagnosis not present

## 2024-06-30 DIAGNOSIS — R5381 Other malaise: Secondary | ICD-10-CM | POA: Diagnosis present

## 2024-06-30 DIAGNOSIS — Z79899 Other long term (current) drug therapy: Secondary | ICD-10-CM | POA: Diagnosis not present

## 2024-06-30 DIAGNOSIS — Z751 Person awaiting admission to adequate facility elsewhere: Secondary | ICD-10-CM | POA: Diagnosis not present

## 2024-06-30 DIAGNOSIS — Z96653 Presence of artificial knee joint, bilateral: Secondary | ICD-10-CM | POA: Diagnosis present

## 2024-06-30 DIAGNOSIS — K219 Gastro-esophageal reflux disease without esophagitis: Secondary | ICD-10-CM | POA: Diagnosis present

## 2024-06-30 DIAGNOSIS — J101 Influenza due to other identified influenza virus with other respiratory manifestations: Secondary | ICD-10-CM | POA: Diagnosis present

## 2024-06-30 DIAGNOSIS — F419 Anxiety disorder, unspecified: Secondary | ICD-10-CM | POA: Diagnosis present

## 2024-06-30 DIAGNOSIS — Z803 Family history of malignant neoplasm of breast: Secondary | ICD-10-CM | POA: Diagnosis not present

## 2024-06-30 LAB — CBC
HCT: 43.3 % (ref 36.0–46.0)
Hemoglobin: 13.6 g/dL (ref 12.0–15.0)
MCH: 27.8 pg (ref 26.0–34.0)
MCHC: 31.4 g/dL (ref 30.0–36.0)
MCV: 88.5 fL (ref 80.0–100.0)
Platelets: 143 K/uL — ABNORMAL LOW (ref 150–400)
RBC: 4.89 MIL/uL (ref 3.87–5.11)
RDW: 12.8 % (ref 11.5–15.5)
WBC: 4.6 K/uL (ref 4.0–10.5)
nRBC: 0 % (ref 0.0–0.2)

## 2024-06-30 LAB — COMPREHENSIVE METABOLIC PANEL WITH GFR
ALT: 10 U/L (ref 0–44)
AST: 22 U/L (ref 15–41)
Albumin: 3.9 g/dL (ref 3.5–5.0)
Alkaline Phosphatase: 109 U/L (ref 38–126)
Anion gap: 12 (ref 5–15)
BUN: 22 mg/dL (ref 8–23)
CO2: 26 mmol/L (ref 22–32)
Calcium: 9.1 mg/dL (ref 8.9–10.3)
Chloride: 99 mmol/L (ref 98–111)
Creatinine, Ser: 0.8 mg/dL (ref 0.44–1.00)
GFR, Estimated: >60 mL/min
Glucose, Bld: 146 mg/dL — ABNORMAL HIGH (ref 70–99)
Potassium: 3.5 mmol/L (ref 3.5–5.1)
Sodium: 136 mmol/L (ref 135–145)
Total Bilirubin: 0.4 mg/dL (ref 0.0–1.2)
Total Protein: 7.1 g/dL (ref 6.5–8.1)

## 2024-06-30 LAB — VITAMIN B12: Vitamin B-12: 1248 pg/mL — ABNORMAL HIGH (ref 180–914)

## 2024-06-30 LAB — MAGNESIUM: Magnesium: 2.3 mg/dL (ref 1.7–2.4)

## 2024-06-30 LAB — T4, FREE: Free T4: 1.29 ng/dL (ref 0.80–2.00)

## 2024-06-30 LAB — FOLATE: Folate: 8.8 ng/mL

## 2024-06-30 LAB — CK: Total CK: 63 U/L (ref 38–234)

## 2024-06-30 LAB — TSH: TSH: 1.97 u[IU]/mL (ref 0.350–4.500)

## 2024-06-30 MED ORDER — B COMPLEX-C PO TABS
1.0000 | ORAL_TABLET | Freq: Every day | ORAL | Status: DC
  Administered 2024-06-30 – 2024-07-03 (×4): 1 via ORAL
  Filled 2024-06-30 (×4): qty 1

## 2024-06-30 NOTE — Plan of Care (Signed)

## 2024-06-30 NOTE — TOC Progression Note (Signed)
 Transition of Care [facility]) - Progression Note    Patient Details  Name: NYEEMA WANT MRN: [id number] Date of Birth: 08/11/1940  [facility]) CM/SW Contact  Doneta Glenys DASEN, RN Phone Number: 06/30/2024, 9:51 AM  Clinical Narrative:    CM spoke with Shandi (dlt) for choice. [facility] was choice. CM called [facility] will accept patient after 5-7 days isolation for Flu (07/02/2024) . Insurance auth started in [place] [id number].   Expected Discharge Plan: Skilled Nursing Facility Barriers to Discharge: Continued Medical Work up, SNF Pending bed offer               Expected Discharge Plan and Services In-house Referral: NA Discharge Planning Services: CM Consult   Living arrangements for the past 2 months: Single Family Home                 DME Arranged: N/A DME Agency: NA       HH Arranged: NA HH Agency: NA         Social Drivers of Health (SDOH) Interventions SDOH Screenings   Food Insecurity: No Food Insecurity (06/28/2024)  Housing: Low Risk (06/28/2024)  Transportation Needs: No Transportation Needs (06/28/2024)  Utilities: Not At Risk (06/28/2024)  Social Connections: Socially Integrated (06/28/2024)  Tobacco Use: Medium Risk (06/28/2024)    Readmission Risk Interventions     No data to display

## 2024-06-30 NOTE — Progress Notes (Signed)
 [facility] Progress Note Patient: [name] FMW:[id number] DOB: 04-05-1941  DOA: 06/27/2024 DOS: the patient was seen and examined on 06/30/2024  Brief Hospital Course: PMH of HTN, GERD presented to the hospital with complaint of generalized fatigue and weakness. being treated for flu. Medically stable.  Now awaiting placement.  SNF can take the patient on 1/15 after isolation is over.  Assessment and Plan: Influenza. Acute metabolic encephalopathy. Generalized weakness and deconditioning. Received with IV fluid. Flu positive. Treated treated with Tamiflu . Respiratory rate improving. Cough improving. Monitor for now.  Deconditioning. Daughter reports patient is progressively weak since Christmas. Acute worsening likely due to flu. No focal deficit on my examination. No significant metabolic abnormality seen as well. PT OT recommending SNF.  HTN. Blood pressure is somewhat stable. On losartan /HCTZ at home as well as Lasix  at home. Currently only continue Lasix  for now.  Chronic use of Valtrex . For now we will continue.  Anxiety. On Prozac . On Ativan . For now continue both.  Obesity Class 1 Body mass index is 31.81 kg/m.  Placing the pt at higher risk of poor outcomes.  Subjective: Improvement in cough.  No nausea or vomiting no fever no chills.  Still weak.  Physical Exam: Basal crackles. S1-S2 present. Bowel sound present Lower extremity chronic appearing edema. Phonic venous stasis. Able to move lower extremity against gravity.  Data Reviewed: I have Reviewed nursing notes, Vitals, and Lab results. Ordered and reviewed B12, TSH, CK, CBC, CMP, folate, free T4.  Disposition: Status is: Inpatient. Currently awaiting placement.  Medically stable.  enoxaparin  (LOVENOX ) injection 40 mg Start: 06/28/24 1000 SCDs Start: 06/28/24 0148   Family Communication: No one at bedside Level of care: Med-Surg   Vitals:   06/29/24 1240 06/29/24 2001  06/30/24 0508 06/30/24 1218  BP: (!) 140/80 119/70 132/64 (!) 115/56  Pulse: 67 65 68 (!) 58  Resp: 18 16 15 18   Temp: 98.6 F (37 C) 98.7 F (37.1 C) 99 F (37.2 C) 98.2 F (36.8 C)  TempSrc: Oral  Oral Oral  SpO2: 91% 95% 91% 95%  Weight:      Height:         Author: Yetta Blanch, MD 06/30/2024 6:23 PM  Please look on [email or web address] to find out who is on call.

## 2024-07-01 DIAGNOSIS — J111 Influenza due to unidentified influenza virus with other respiratory manifestations: Secondary | ICD-10-CM | POA: Diagnosis not present

## 2024-07-01 NOTE — Plan of Care (Signed)

## 2024-07-01 NOTE — Plan of Care (Signed)

## 2024-07-01 NOTE — Progress Notes (Signed)
 " PROGRESS NOTE    [name]  FMW:[id number] DOB: 27-Aug-1940 DOA: 06/27/2024 PCP: Dwight Trula SQUIBB, MD    Brief Narrative:  84 year old with history of hypertension, GERD presented with generalized fatigue and weakness and found to have influenza A infection.  Medically stabilized but remains very debilitated.  Waiting for SNF bed.  Subjective: Patient seen and examined.  She has some dry cough.  Patient tells me why she is so weak. Assessment & Plan:   Acute influenza A infection with acute metabolic encephalopathy and deconditioning: Treated symptomatically with IV fluids.  Clinically improving. Completed Tamiflu . Continue to provide respiratory therapy, chest physiotherapy, mucolytic's, bronchodilator therapy and mobility with PT OT. Without focal deficits. PT OT recommending SNF, waiting for bed availability.  Medically stable.  Essential hypertension: Blood pressure stable.  On losartan  and Lasix .  Continued.    Anxiety, on Prozac  and Ativan .    DVT prophylaxis: enoxaparin  (LOVENOX ) injection 40 mg Start: 06/28/24 1000 SCDs Start: 06/28/24 0148   Code Status: Full code Family Communication: None at the bedside Disposition Plan: Status is: Inpatient Remains inpatient appropriate because: Medically stable.  Waiting for SNF bed.     Consultants:  None  Procedures:  None  Antimicrobials:  Completed     Objective: Vitals:   06/30/24 0508 06/30/24 1218 06/30/24 2154 07/01/24 0620  BP: 132/64 (!) 115/56 (!) 117/92 (!) 120/57  Pulse: 68 (!) 58 66 (!) 59  Resp: 15 18 18 18   Temp: 99 F (37.2 C) 98.2 F (36.8 C) 98.6 F (37 C) 98.2 F (36.8 C)  TempSrc: Oral Oral Oral Oral  SpO2: 91% 95% 94% 91%  Weight:      Height:        Intake/Output Summary (Last 24 hours) at 07/01/2024 0828 Last data filed at 07/01/2024 9364 Gross per 24 hour  Intake 480 ml  Output 700 ml  Net -220 ml   Filed Weights   06/28/24 0320  Weight: 94.9 kg     Examination:  General exam: Appears calm and comfortable.  Pleasant and interactive. Respiratory system: Clear to auscultation. Respiratory effort normal. Cardiovascular system: S1 & S2 heard, RRR.  Gastrointestinal system: Soft.  Nontender.  Obese and pendulous.   Central nervous system: Alert and oriented. No focal neurological deficits.    Data Reviewed: I have personally reviewed following labs and imaging studies  CBC: Recent Labs  Lab 06/27/24 0136 06/27/24 2326 06/30/24 1338  WBC 5.9 5.6 4.6  NEUTROABS 4.5 4.2  --   HGB 13.7 13.5 13.6  HCT 43.5 43.4 43.3  MCV 89.9 89.7 88.5  PLT 162 133* 143*   Basic Metabolic Panel: Recent Labs  Lab 06/27/24 0136 06/27/24 2326 06/30/24 1338  NA 137 137 136  K 4.0 4.2 3.5  CL 100 101 99  CO2 26 25 26   GLUCOSE 98 95 146*  BUN 20 15 22   CREATININE 0.73 0.75 0.80  CALCIUM  9.2 8.8* 9.1  MG  --   --  2.3   GFR: Estimated Creatinine Clearance: 63.1 mL/min (by C-G formula based on SCr of 0.8 mg/dL). Liver Function Tests: Recent Labs  Lab 06/27/24 0136 06/30/24 1338  AST 22 22  ALT 11 10  ALKPHOS 126 109  BILITOT 0.3 0.4  PROT 7.5 7.1  ALBUMIN 4.2 3.9   No results for input(s): LIPASE, AMYLASE in the last 168 hours. No results for input(s): AMMONIA in the last 168 hours. Coagulation Profile: No results for input(s): INR, PROTIME in the last  168 hours. Cardiac Enzymes: Recent Labs  Lab 06/30/24 1338  CKTOTAL 63   BNP (last 3 results) No results for input(s): PROBNP in the last 8760 hours. HbA1C: No results for input(s): HGBA1C in the last 72 hours. CBG: No results for input(s): GLUCAP in the last 168 hours. Lipid Profile: No results for input(s): CHOL, HDL, LDLCALC, TRIG, CHOLHDL, LDLDIRECT in the last 72 hours. Thyroid  Function Tests: Recent Labs    06/30/24 1338  TSH 1.970  FREET4 1.29   Anemia Panel: Recent Labs    06/30/24 1334 06/30/24 1337  VITAMINB12  --   1,248*  FOLATE 8.8  --    Sepsis Labs: No results for input(s): PROCALCITON, LATICACIDVEN in the last 168 hours.  Recent Results (from the past 240 hours)  Resp panel by RT-PCR (RSV, Flu A&B, Covid) Anterior Nasal Swab     Status: Abnormal   Collection Time: 06/27/24  1:17 AM   Specimen: Anterior Nasal Swab  Result Value Ref Range Status   SARS Coronavirus 2 by RT PCR NEGATIVE NEGATIVE Final    Comment: (NOTE) SARS-CoV-2 target nucleic acids are NOT DETECTED.  The SARS-CoV-2 RNA is generally detectable in upper respiratory specimens during the acute phase of infection. The lowest concentration of SARS-CoV-2 viral copies this assay can detect is 138 copies/mL. A negative result does not preclude SARS-Cov-2 infection and should not be used as the sole basis for treatment or other patient management decisions. A negative result may occur with  improper specimen collection/handling, submission of specimen other than nasopharyngeal swab, presence of viral mutation(s) within the areas targeted by this assay, and inadequate number of viral copies(<138 copies/mL). A negative result must be combined with clinical observations, patient history, and epidemiological information. The expected result is Negative.  Fact Sheet for Patients:  bloggercourse.com  Fact Sheet for Healthcare Providers:  seriousbroker.it  This test is no t yet approved or cleared by the [place]  FDA and  has been authorized for detection and/or diagnosis of SARS-CoV-2 by FDA under an Emergency Use Authorization (EUA). This EUA will remain  in effect (meaning this test can be used) for the duration of the COVID-19 declaration under Section 564(b)(1) of the Act, 21 U.S.C.section 360bbb-3(b)(1), unless the authorization is terminated  or revoked sooner.       Influenza A by PCR POSITIVE (A) NEGATIVE Final   Influenza B by PCR NEGATIVE NEGATIVE Final     Comment: (NOTE) The Xpert Xpress SARS-CoV-2/FLU/RSV plus assay is intended as an aid in the diagnosis of influenza from Nasopharyngeal swab specimens and should not be used as a sole basis for treatment. Nasal washings and aspirates are unacceptable for Xpert Xpress SARS-CoV-2/FLU/RSV testing.  Fact Sheet for Patients: bloggercourse.com  Fact Sheet for Healthcare Providers: seriousbroker.it  This test is not yet approved or cleared by the [place]  FDA and has been authorized for detection and/or diagnosis of SARS-CoV-2 by FDA under an Emergency Use Authorization (EUA). This EUA will remain in effect (meaning this test can be used) for the duration of the COVID-19 declaration under Section 564(b)(1) of the Act, 21 U.S.C. section 360bbb-3(b)(1), unless the authorization is terminated or revoked.     Resp Syncytial Virus by PCR NEGATIVE NEGATIVE Final    Comment: (NOTE) Fact Sheet for Patients: bloggercourse.com  Fact Sheet for Healthcare Providers: seriousbroker.it  This test is not yet approved or cleared by the [place]  FDA and has been authorized for detection and/or diagnosis of SARS-CoV-2 by FDA under an Emergency  Use Authorization (EUA). This EUA will remain in effect (meaning this test can be used) for the duration of the COVID-19 declaration under Section 564(b)(1) of the Act, 21 U.S.C. section 360bbb-3(b)(1), unless the authorization is terminated or revoked.  Performed at [facility], [address], [place], [place] [address]          Radiology Studies: No results found.      Scheduled Meds:  B-complex with vitamin C  1 tablet Oral Daily   enoxaparin  (LOVENOX ) injection  40 mg Subcutaneous Q24H   feeding supplement  237 mL Oral BID BM   FLUoxetine   60 mg Oral Daily   furosemide   40 mg Oral Daily   losartan   50 mg Oral  Daily   valACYclovir   500 mg Oral QODAY   Continuous Infusions:   LOS: 1 day    Time spent: 35 minutes     Renato Applebaum, MD [facility]   "

## 2024-07-01 NOTE — Progress Notes (Signed)
 Physical Therapy Treatment Patient Details Name: [name] MRN: [id number] DOB: 03-01-1941 Today's Date: 07/01/2024   History of Present Illness 84 y.o. female who presents to Emergency Department on 06/27/24 with generalized weakness.  Patient was seen yesterday and diagnosed with the flu.  PMH - anxiety, neck pain, achilles rupture, TKR x 2, revision TKR, osteoporosis, macular degeneration, chronic lymphedema, L basal ganglia hemorrhage    PT Comments  Pt resting in bed, reports that she is going to [employer] and state that means I don't need to do therapy today right? With encouragement, pt agreeable to transfer to recliner to continue watching [name].  She demonstrates good improvements with her overall mobility, now requiring only MIN A for bed mobility and transfers with a RW. She reports she has been walking to the bathroom with RW and staff. Reviewed seated and supine exercises pt can do to continue to improve her strength and maintain ROM. Pt will continue to benefit from skilled therapy intervention at SNF level for <3hrs/day.    If plan is discharge home, recommend the following: A little help with walking and/or transfers;A little help with bathing/dressing/bathroom;Assist for transportation;Help with stairs or ramp for entrance;Assistance with cooking/housework   Can travel by private vehicle     No  Equipment Recommendations  None recommended by PT    Recommendations for Other Services       Precautions / Restrictions Precautions Precautions: Fall Recall of Precautions/Restrictions: Intact Restrictions Weight Bearing Restrictions Per Provider Order: No     Mobility  Bed Mobility Overal bed mobility: Needs Assistance Bed Mobility: Supine to Sit     Supine to sit: HOB elevated, Used rails, Min assist     General bed mobility comments: increased time and effort with MIN A to sit up and scoot hips forward to EOB    Transfers Overall  transfer level: Needs assistance Equipment used: Rolling walker (2 wheels) Transfers: Sit to/from Stand, Bed to chair/wheelchair/BSC Sit to Stand: Min assist   Step pivot transfers: Min assist       General transfer comment: MIN A sit<>stand from elevated bed surface with RW and VC for proper hand placement, MIN A to take steps to pivot to recliner with VC to reach back for armrests to to assist with controlled lowering to recliner    Ambulation/Gait Ambulation/Gait assistance:  (declined ambulation during session, reports she has been walking to the bathroom with staff assist and RW)                 Stairs             Wheelchair Mobility     Tilt Bed    Modified Rankin (Stroke Patients Only)       Balance Overall balance assessment: Needs assistance Sitting-balance support: Feet supported Sitting balance-Leahy Scale: Good     Standing balance support: During functional activity, Reliant on assistive device for balance Standing balance-Leahy Scale: Fair                              Hotel Manager: No apparent difficulties  Cognition Arousal: Alert Behavior During Therapy: WFL for tasks assessed/performed   PT - Cognitive impairments: No apparent impairments                         Following commands: Intact      Cueing Cueing Techniques: Verbal cues, Tactile  cues  Exercises      General Comments        Pertinent Vitals/Pain Pain Assessment Pain Assessment: No/denies pain    Home Living                          Prior Function            PT Goals (current goals can now be found in the care plan section) Acute Rehab PT Goals PT Goal Formulation: With patient Time For Goal Achievement: 07/12/24 Potential to Achieve Goals: Good Progress towards PT goals: Progressing toward goals    Frequency    Min 2X/week      PT Plan      Co-evaluation              AM-PAC  PT 6 Clicks Mobility   Outcome Measure  Help needed turning from your back to your side while in a flat bed without using bedrails?: A Little Help needed moving from lying on your back to sitting on the side of a flat bed without using bedrails?: A Little Help needed moving to and from a bed to a chair (including a wheelchair)?: A Little Help needed standing up from a chair using your arms (e.g., wheelchair or bedside chair)?: A Little Help needed to walk in hospital room?: A Lot Help needed climbing 3-5 steps with a railing? : Total 6 Click Score: 15    End of Session Equipment Utilized During Treatment: Gait belt Activity Tolerance: Patient tolerated treatment well Patient left: in chair;with call bell/phone within reach;with chair alarm set Nurse Communication: Mobility status PT Visit Diagnosis: Muscle weakness (generalized) (M62.81);Difficulty in walking, not elsewhere classified (R26.2)     Time: 8542-8488 PT Time Calculation (min) (ACUTE ONLY): 14 min  Charges:    $Therapeutic Activity: 8-22 mins                       [name], PT, DPT   [employer] 07/01/2024, 3:41 PM

## 2024-07-01 NOTE — TOC Progression Note (Signed)
 Transition of Care [facility]) - Progression Note    Patient Details  Name: [name] MRN: [id number] Date of Birth: Mar 04, 1941  [facility] CM/SW Contact  Doneta Glenys DASEN, RN Phone Number: 07/01/2024, 8:34 AM  Clinical Narrative:    Insurance auth approved - Plan Auth ID [id number] exp. 07/04/2024  for [facility] update with discharge date for 07/02/2024.    Expected Discharge Plan: Skilled Nursing Facility Barriers to Discharge: Continued Medical Work up, SNF Pending bed offer               Expected Discharge Plan and Services In-house Referral: NA Discharge Planning Services: CM Consult   Living arrangements for the past 2 months: Single Family Home                 DME Arranged: N/A DME Agency: NA       HH Arranged: NA HH Agency: NA         Social Drivers of Health (SDOH) Interventions SDOH Screenings   Food Insecurity: No Food Insecurity (06/28/2024)  Housing: Low Risk (06/28/2024)  Transportation Needs: No Transportation Needs (06/28/2024)  Utilities: Not At Risk (06/28/2024)  Social Connections: Socially Integrated (06/28/2024)  Tobacco Use: Medium Risk (06/28/2024)    Readmission Risk Interventions     No data to display

## 2024-07-02 DIAGNOSIS — J111 Influenza due to unidentified influenza virus with other respiratory manifestations: Secondary | ICD-10-CM | POA: Diagnosis not present

## 2024-07-02 MED ORDER — GUAIFENESIN-DM 100-10 MG/5ML PO SYRP: 5.0000 mL | ORAL_SOLUTION | ORAL | Status: AC | PRN

## 2024-07-02 MED ORDER — LOSARTAN POTASSIUM 50 MG PO TABS: 50.0000 mg | ORAL_TABLET | Freq: Every day | ORAL | Status: AC

## 2024-07-02 NOTE — Plan of Care (Signed)
   Problem: Education: Goal: Knowledge of General Education information will improve Description Including pain rating scale, medication(s)/side effects and non-pharmacologic comfort measures Outcome: Progressing   Problem: Health Behavior/Discharge Planning: Goal: Ability to manage health-related needs will improve Outcome: Progressing

## 2024-07-02 NOTE — Discharge Summary (Signed)
 Physician Discharge Summary  ANALIZ TVEDT FMW:[id number] DOB: 28-Sep-1940 DOA: 06/27/2024  PCP: Dwight Trula SQUIBB, MD  Admit date: 06/27/2024 Discharge date: 07/02/2024  Admitted From: Home Disposition: Skilled nursing facility  Recommendations for Outpatient Follow-up:  Follow up with PCP in 1-2 weeks Please obtain BMP/CBC in one week Please follow up on the following pending results:   Discharge Condition: Stable CODE STATUS: Full code Diet recommendation: Regular diet  Discharge summary: 84 year old with history of hypertension, GERD presented with generalized fatigue and weakness and found to have influenza A infection.  Medically stabilized but remains very debilitated.    Acute influenza A infection with acute metabolic encephalopathy and deconditioning: Treated symptomatically with IV fluids.  Clinically improving. Completed Tamiflu . Continue to provide respiratory therapy, chest physiotherapy, mucolytic's, bronchodilator therapy and mobility with PT OT. Without focal deficits. PT OT recommending SNF,.  Patient is going to SNF for rehab before going home.   Essential hypertension: Blood pressure stable.  Patient on losartan  hydrochlorothiazide  as well as Lasix .  Will discontinue the combination, continue losartan  and continue Lasix .     Anxiety, on Prozac .   Medically stable to transition to skilled level of care.   Discharge Diagnoses:  Principal Problem:   Influenza    Discharge Instructions  Discharge Instructions     Diet - low sodium heart healthy   Complete by: As directed    Increase activity slowly   Complete by: As directed       Allergies as of 07/02/2024       Reactions   Other Itching, Other (See Comments)   Pollen = Itchy eyes/runny nose        Medication List     STOP taking these medications    LORazepam  0.5 MG tablet Commonly known as: ATIVAN    losartan -hydrochlorothiazide  50-12.5 MG tablet Commonly known as: HYZAAR    metroNIDAZOLE  500 MG tablet Commonly known as: FLAGYL    oseltamivir  75 MG capsule Commonly known as: TAMIFLU    potassium chloride  SA 20 MEQ tablet Commonly known as: KLOR-CON  M   senna-docusate 8.6-50 MG tablet Commonly known as: Senokot-S       TAKE these medications    CALCIUM  + VITAMIN D3 PO Take 1 tablet by mouth daily.   cetirizine 10 MG tablet Commonly known as: ZYRTEC Take 10 mg by mouth daily as needed for allergies.   FLUoxetine  20 MG tablet Commonly known as: PROZAC  Take 60 mg by mouth in the morning. What changed: Another medication with the same name was removed. Continue taking this medication, and follow the directions you see here.   furosemide  40 MG tablet Commonly known as: LASIX  Take 1 tablet (40 mg total) by mouth daily. What changed: Another medication with the same name was removed. Continue taking this medication, and follow the directions you see here.   guaiFENesin -dextromethorphan  100-10 MG/5ML syrup Commonly known as: ROBITUSSIN DM Take 5 mLs by mouth every 4 (four) hours as needed for cough.   losartan  50 MG tablet Commonly known as: COZAAR  Take 1 tablet (50 mg total) by mouth daily.   polyethylene glycol 17 g packet Commonly known as: MIRALAX  / GLYCOLAX  Take 17 g by mouth daily as needed.   potassium chloride  10 MEQ tablet Commonly known as: KLOR-CON  Take 10 mEq by mouth daily.   Tylenol  8 Hour Arthritis Pain 650 MG CR tablet Generic drug: acetaminophen  Take 1,300 mg by mouth every 8 (eight) hours as needed for pain. What changed: Another medication with the same name was  removed. Continue taking this medication, and follow the directions you see here.   valACYclovir  500 MG tablet Commonly known as: VALTREX  Take 1 tablet (500 mg total) by mouth every other day.        Contact information for after-discharge care     Destination     [facility] [place] .   Service: Skilled Nursing Contact  information: [address] [place]  [phone]                    Allergies[1]  Consultations: None   Procedures/Studies: CT HEAD WO CONTRAST ( ) Result Date: 06/28/2024 EXAM: CT HEAD WITHOUT CONTRAST 06/28/2024 12:37:22 AM TECHNIQUE: CT of the head was performed without the administration of intravenous contrast. Automated exposure control, iterative reconstruction, and/or weight based adjustment of the mA/kV was utilized to reduce the radiation dose to as low as reasonably achievable. COMPARISON: 03/05/2024 CLINICAL HISTORY: Neuro deficit, acute, stroke suspected. FINDINGS: BRAIN AND VENTRICLES: No acute hemorrhage. No evidence of acute infarct. Global cortical atrophy. Subcortical and periventricular small vessel ischemic changes. Old left basal ganglia lacunar infarct. Intracranial atherosclerosis. No hydrocephalus. No extra-axial collection. No mass effect or midline shift. ORBITS: No acute abnormality. SINUSES: No acute abnormality. SOFT TISSUES AND SKULL: No acute soft tissue abnormality. No skull fracture. IMPRESSION: 1. No acute intracranial abnormality. Electronically signed by: Pinkie Pebbles MD MD 06/28/2024 12:39 AM EST RP Workstation: [id number]   DG Wrist Complete Left Result Date: 06/27/2024 EXAM: 3 or more VIEW(S) XRAY OF THE LEFT WRIST 06/27/2024 01:02:42 AM COMPARISON: None available. CLINICAL HISTORY: Fall FINDINGS: BONES AND JOINTS: No acute fracture. Diffusely decreased bone density. Mild degenerative changes of the Triscaphe joint. SOFT TISSUES: Vascular calcifications. IMPRESSION: 1. No evidence of acute traumatic injury. Electronically signed by: Morgane Naveau MD MD 06/27/2024 01:07 AM EST RP Workstation: [id number]   DG Chest 2 View Result Date: 06/27/2024 EXAM: 2 VIEW(S) XRAY OF THE CHEST 06/27/2024 01:02:42 AM COMPARISON: None available. CLINICAL HISTORY: Cough FINDINGS: LUNGS AND PLEURA: Low lung volume. No focal pulmonary  opacity. No pleural effusion. No pneumothorax. HEART AND MEDIASTINUM: Aortic arch calcifications. No acute abnormality of the cardiac and mediastinal silhouettes. BONES AND SOFT TISSUES: Severe degenerative changes of left shoulder. Likely right shoulder calcific tendinosis. No acute osseous abnormality. IMPRESSION: 1. Low lung volumes. No acute cardiopulmonary findings. Electronically signed by: Morgane Naveau MD MD 06/27/2024 01:06 AM EST RP Workstation: [id number]   (Echo, Carotid, EGD, Colonoscopy, ERCP)    Subjective: Patient seen and examined.  She has some cough.  Denies any complaints.  Comfortable with plan to go to SNF.   Discharge Exam: Vitals:   07/01/24 2038 07/02/24 0605  BP: (!) 122/55 133/62  Pulse: 65 66  Resp:    Temp: (!) 97.4 F (36.3 C) 98.4 F (36.9 C)  SpO2: 94% 93%   Vitals:   06/30/24 2154 07/01/24 0620 07/01/24 2038 07/02/24 0605  BP: (!) 117/92 (!) 120/57 (!) 122/55 133/62  Pulse: 66 (!) 59 65 66  Resp: 18 18    Temp: 98.6 F (37 C) 98.2 F (36.8 C) (!) 97.4 F (36.3 C) 98.4 F (36.9 C)  TempSrc: Oral Oral Oral Oral  SpO2: 94% 91% 94% 93%  Weight:      Height:        General: Pt is alert, awake, not in acute distress Cardiovascular: RRR, S1/S2 +, no rubs, no gallops Respiratory: CTA bilaterally, no wheezing, no rhonchi Abdominal: Soft, NT, ND, bowel sounds +  Extremities: no edema, no cyanosis    The results of significant diagnostics from this hospitalization (including imaging, microbiology, ancillary and laboratory) are listed below for reference.     Microbiology: Recent Results (from the past 240 hours)  Resp panel by RT-PCR (RSV, Flu A&B, Covid) Anterior Nasal Swab     Status: Abnormal   Collection Time: 06/27/24  1:17 AM   Specimen: Anterior Nasal Swab  Result Value Ref Range Status   SARS Coronavirus 2 by RT PCR NEGATIVE NEGATIVE Final    Comment: (NOTE) SARS-CoV-2 target nucleic acids are NOT DETECTED.  The SARS-CoV-2 RNA is  generally detectable in upper respiratory specimens during the acute phase of infection. The lowest concentration of SARS-CoV-2 viral copies this assay can detect is 138 copies/mL. A negative result does not preclude SARS-Cov-2 infection and should not be used as the sole basis for treatment or other patient management decisions. A negative result may occur with  improper specimen collection/handling, submission of specimen other than nasopharyngeal swab, presence of viral mutation(s) within the areas targeted by this assay, and inadequate number of viral copies(<138 copies/mL). A negative result must be combined with clinical observations, patient history, and epidemiological information. The expected result is Negative.  Fact Sheet for Patients:  bloggercourse.com  Fact Sheet for Healthcare Providers:  seriousbroker.it  This test is no t yet approved or cleared by the [place]  FDA and  has been authorized for detection and/or diagnosis of SARS-CoV-2 by FDA under an Emergency Use Authorization (EUA). This EUA will remain  in effect (meaning this test can be used) for the duration of the COVID-19 declaration under Section 564(b)(1) of the Act, 21 U.S.C.section 360bbb-3(b)(1), unless the authorization is terminated  or revoked sooner.       Influenza A by PCR POSITIVE (A) NEGATIVE Final   Influenza B by PCR NEGATIVE NEGATIVE Final    Comment: (NOTE) The Xpert Xpress SARS-CoV-2/FLU/RSV plus assay is intended as an aid in the diagnosis of influenza from Nasopharyngeal swab specimens and should not be used as a sole basis for treatment. Nasal washings and aspirates are unacceptable for Xpert Xpress SARS-CoV-2/FLU/RSV testing.  Fact Sheet for Patients: bloggercourse.com  Fact Sheet for Healthcare Providers: seriousbroker.it  This test is not yet approved or cleared by the  [place]  FDA and has been authorized for detection and/or diagnosis of SARS-CoV-2 by FDA under an Emergency Use Authorization (EUA). This EUA will remain in effect (meaning this test can be used) for the duration of the COVID-19 declaration under Section 564(b)(1) of the Act, 21 U.S.C. section 360bbb-3(b)(1), unless the authorization is terminated or revoked.     Resp Syncytial Virus by PCR NEGATIVE NEGATIVE Final    Comment: (NOTE) Fact Sheet for Patients: bloggercourse.com  Fact Sheet for Healthcare Providers: seriousbroker.it  This test is not yet approved or cleared by the [place]  FDA and has been authorized for detection and/or diagnosis of SARS-CoV-2 by FDA under an Emergency Use Authorization (EUA). This EUA will remain in effect (meaning this test can be used) for the duration of the COVID-19 declaration under Section 564(b)(1) of the Act, 21 U.S.C. section 360bbb-3(b)(1), unless the authorization is terminated or revoked.  Performed at [facility], [address] [place], [place] [address]      Labs: BNP (last 3 results) Recent Labs    10/04/23 1450  BNP 128.8*   Basic Metabolic Panel: Recent Labs  Lab 06/27/24 0136 06/27/24 2326 06/30/24 1338  NA 137 137 136  K 4.0 4.2 3.5  CL 100 101 99  CO2 26 25 26   GLUCOSE 98 95 146*  BUN 20 15 22   CREATININE 0.73 0.75 0.80  CALCIUM  9.2 8.8* 9.1  MG  --   --  2.3   Liver Function Tests: Recent Labs  Lab 06/27/24 0136 06/30/24 1338  AST 22 22  ALT 11 10  ALKPHOS 126 109  BILITOT 0.3 0.4  PROT 7.5 7.1  ALBUMIN 4.2 3.9   No results for input(s): LIPASE, AMYLASE in the last 168 hours. No results for input(s): AMMONIA in the last 168 hours. CBC: Recent Labs  Lab 06/27/24 0136 06/27/24 2326 06/30/24 1338  WBC 5.9 5.6 4.6  NEUTROABS 4.5 4.2  --   HGB 13.7 13.5 13.6  HCT 43.5 43.4 43.3  MCV 89.9 89.7 88.5  PLT 162  133* 143*   Cardiac Enzymes: Recent Labs  Lab 06/30/24 1338  CKTOTAL 63   BNP: Invalid input(s): POCBNP CBG: No results for input(s): GLUCAP in the last 168 hours. D-Dimer No results for input(s): DDIMER in the last 72 hours. Hgb A1c No results for input(s): HGBA1C in the last 72 hours. Lipid Profile No results for input(s): CHOL, HDL, LDLCALC, TRIG, CHOLHDL, LDLDIRECT in the last 72 hours. Thyroid  function studies Recent Labs    06/30/24 1338  TSH 1.970   Anemia work up Recent Labs    06/30/24 1334 06/30/24 1337  VITAMINB12  --  1,248*  FOLATE 8.8  --    Urinalysis    Component Value Date/Time   COLORURINE YELLOW 02/25/2023 0659   APPEARANCEUR HAZY (A) 02/25/2023 0659   LABSPEC 1.020 02/25/2023 0659   PHURINE 5.0 02/25/2023 0659   GLUCOSEU NEGATIVE 02/25/2023 0659   HGBUR SMALL (A) 02/25/2023 0659   BILIRUBINUR NEGATIVE 02/25/2023 0659   KETONESUR NEGATIVE 02/25/2023 0659   PROTEINUR 30 (A) 02/25/2023 0659   UROBILINOGEN 0.2 01/19/2013 1022   NITRITE NEGATIVE 02/25/2023 0659   LEUKOCYTESUR MODERATE (A) 02/25/2023 0659   Sepsis Labs Recent Labs  Lab 06/27/24 0136 06/27/24 2326 06/30/24 1338  WBC 5.9 5.6 4.6   Microbiology Recent Results (from the past 240 hours)  Resp panel by RT-PCR (RSV, Flu A&B, Covid) Anterior Nasal Swab     Status: Abnormal   Collection Time: 06/27/24  1:17 AM   Specimen: Anterior Nasal Swab  Result Value Ref Range Status   SARS Coronavirus 2 by RT PCR NEGATIVE NEGATIVE Final    Comment: (NOTE) SARS-CoV-2 target nucleic acids are NOT DETECTED.  The SARS-CoV-2 RNA is generally detectable in upper respiratory specimens during the acute phase of infection. The lowest concentration of SARS-CoV-2 viral copies this assay can detect is 138 copies/mL. A negative result does not preclude SARS-Cov-2 infection and should not be used as the sole basis for treatment or other patient management decisions. A negative  result may occur with  improper specimen collection/handling, submission of specimen other than nasopharyngeal swab, presence of viral mutation(s) within the areas targeted by this assay, and inadequate number of viral copies(<138 copies/mL). A negative result must be combined with clinical observations, patient history, and epidemiological information. The expected result is Negative.  Fact Sheet for Patients:  bloggercourse.com  Fact Sheet for Healthcare Providers:  seriousbroker.it  This test is no t yet approved or cleared by the [place]  FDA and  has been authorized for detection and/or diagnosis of SARS-CoV-2 by FDA under an Emergency Use Authorization (EUA). This EUA will remain  in effect (meaning this test  can be used) for the duration of the COVID-19 declaration under Section 564(b)(1) of the Act, 21 U.S.C.section 360bbb-3(b)(1), unless the authorization is terminated  or revoked sooner.       Influenza A by PCR POSITIVE (A) NEGATIVE Final   Influenza B by PCR NEGATIVE NEGATIVE Final    Comment: (NOTE) The Xpert Xpress SARS-CoV-2/FLU/RSV plus assay is intended as an aid in the diagnosis of influenza from Nasopharyngeal swab specimens and should not be used as a sole basis for treatment. Nasal washings and aspirates are unacceptable for Xpert Xpress SARS-CoV-2/FLU/RSV testing.  Fact Sheet for Patients: bloggercourse.com  Fact Sheet for Healthcare Providers: seriousbroker.it  This test is not yet approved or cleared by the [place]  FDA and has been authorized for detection and/or diagnosis of SARS-CoV-2 by FDA under an Emergency Use Authorization (EUA). This EUA will remain in effect (meaning this test can be used) for the duration of the COVID-19 declaration under Section 564(b)(1) of the Act, 21 U.S.C. section 360bbb-3(b)(1), unless the authorization is  terminated or revoked.     Resp Syncytial Virus by PCR NEGATIVE NEGATIVE Final    Comment: (NOTE) Fact Sheet for Patients: bloggercourse.com  Fact Sheet for Healthcare Providers: seriousbroker.it  This test is not yet approved or cleared by the [place]  FDA and has been authorized for detection and/or diagnosis of SARS-CoV-2 by FDA under an Emergency Use Authorization (EUA). This EUA will remain in effect (meaning this test can be used) for the duration of the COVID-19 declaration under Section 564(b)(1) of the Act, 21 U.S.C. section 360bbb-3(b)(1), unless the authorization is terminated or revoked.  Performed at [facility], [address], [place], [place] [address]      Time coordinating discharge: 32 minutes  SIGNED:   Renato Applebaum, MD  [facility] 07/02/2024, 1:14 PM     [1]  Allergies Allergen Reactions   Other Itching and Other (See Comments)    Pollen = Itchy eyes/runny nose

## 2024-07-02 NOTE — TOC Progression Note (Signed)
 Transition of Care [facility]) - Progression Note    Patient Details  Name: [name] MRN: [id number] Date of Birth: February 03, 1941  [facility] of [facility]) CM/SW Contact  Doneta Glenys DASEN, RN Phone Number: 07/02/2024, 8:18 AM  Clinical Narrative:    Orysia Goldstein called and stated that patient needs to be 7 days from positive. CM requested a comprise for 6 days with stable vitals. [place] agreeable.CM updated provider, nurse and patients daughter Patricio.   Expected Discharge Plan: Skilled Nursing Facility Barriers to Discharge: Continued Medical Work up, SNF Pending bed offer               Expected Discharge Plan and Services In-house Referral: NA Discharge Planning Services: CM Consult   Living arrangements for the past 2 months: Single Family Home Expected Discharge Date: 07/02/24               DME Arranged: N/A DME Agency: NA       HH Arranged: NA HH Agency: NA         Social Drivers of Health (SDOH) Interventions SDOH Screenings   Food Insecurity: No Food Insecurity (06/28/2024)  Housing: Low Risk (06/28/2024)  Transportation Needs: No Transportation Needs (06/28/2024)  Utilities: Not At Risk (06/28/2024)  Social Connections: Socially Integrated (06/28/2024)  Tobacco Use: Medium Risk (06/28/2024)    Readmission Risk Interventions     No data to display

## 2024-07-03 NOTE — [place] FL2 (Signed)
 " [place]  MEDICAID FL2 LEVEL OF CARE FORM     IDENTIFICATION  Patient Name: [name] Birthdate: 1941/01/29 Sex: female Admission Date (Current Location): 06/27/2024  [facility] and [place] Number:  Producer, Television/film/video and Address:  [facility],  [address], [place] [address]      Provider Number: [id number]  Attending Physician Name and Address:  Raenelle Coria, MD  Relative Name and Phone Number:       Current Level of Care: Hospital Recommended Level of Care: Skilled Nursing Facility Prior Approval Number:    Date Approved/Denied:   PASRR Number: [id number]  Discharge Plan: SNF    Current Diagnoses: Patient Active Problem List   Diagnosis Date Noted   Influenza 06/28/2024   Basal ganglia hemorrhage (HCC) 10/07/2023   ICH (intracerebral hemorrhage) (HCC) 10/04/2023   Cellulitis 08/16/2022   Bilateral lower extremity edema 08/16/2022   GERD (gastroesophageal reflux disease) 08/16/2022   Acute back pain 05/25/2022   Recurrent falls 05/23/2022   Class 1 obesity due to excess calories with body mass index (BMI) of 30.0 to 30.9 in adult 05/23/2022   Advanced nonexudative age-related macular degeneration of right eye with subfoveal involvement 05/04/2021   Early stage nonexudative age-related macular degeneration of left eye 12/01/2020   Nuclear sclerotic cataract of left eye 02/25/2020   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   Exudative age-related macular degeneration of right eye with inactive choroidal neovascularization (HCC) 09/24/2019   Retinal hemorrhage of right eye 09/24/2019   Retinal exudates and deposits 09/24/2019   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   Mastodynia, female 03/19/2016   Cystocele 02/08/2014   Vaginal atrophy 01/28/2014   History of herpes simplex infection 01/28/2014   Knee pain, acute 03/05/2013   Anxiety and depression 02/05/2013   Constipation 02/05/2013   Psoriasis 02/05/2013    Failed total knee arthroplasty 01/23/2013   Sciatic pain    Detrusor instability    Uterine prolapse    Atrophic vaginitis    Osteoporosis     Orientation RESPIRATION BLADDER Height & Weight     Self, Time, Situation, Place  Normal External catheter, Incontinent Weight: 94.9 kg Height:  5' 8 (172.7 cm)  BEHAVIORAL SYMPTOMS/MOOD NEUROLOGICAL BOWEL NUTRITION STATUS      Continent Diet  AMBULATORY STATUS COMMUNICATION OF NEEDS Skin   Extensive Assist Verbally Normal                       Personal Care Assistance Level of Assistance  Bathing, Feeding, Dressing Bathing Assistance: Limited assistance Feeding assistance: Limited assistance Dressing Assistance: Limited assistance     Functional Limitations Info  Sight, Hearing, Speech Sight Info: Impaired Hearing Info: Adequate Speech Info: Adequate    SPECIAL CARE FACTORS FREQUENCY  PT (By licensed PT), OT (By licensed OT)     PT Frequency: 5x weekly OT Frequency: 5x weekly            Contractures Contractures Info: Not present    Additional Factors Info  Code Status, Allergies Code Status Info: FULL Allergies Info: Pollen           Current Medications (07/03/2024):  This is the current hospital active medication list Current Facility-Administered Medications  Medication Dose Route Frequency Provider Last Rate Last Admin   acetaminophen  (TYLENOL ) tablet 650 mg  650 mg Oral Q6H PRN Dena Charleston, MD   650 mg at 06/28/24 1950   Or   acetaminophen  (TYLENOL ) suppository 650 mg  650 mg Rectal  Q6H PRN Dena Charleston, MD       B-complex with vitamin C tablet 1 tablet  1 tablet Oral Daily Patel, Pranav M, MD   1 tablet at 07/02/24 0931   enoxaparin  (LOVENOX ) injection 40 mg  40 mg Subcutaneous Q24H Dena Charleston, MD   40 mg at 07/02/24 0930   feeding supplement (ENSURE PLUS HIGH PROTEIN) liquid 237 mL  237 mL Oral BID BM Dena Charleston, MD   237 mL at 07/02/24 0932   FLUoxetine  (PROZAC ) capsule 60 mg  60  mg Oral Daily Dorrell, Charleston, MD   60 mg at 07/02/24 0930   furosemide  (LASIX ) tablet 40 mg  40 mg Oral Daily Dorrell, Charleston, MD   40 mg at 07/02/24 0931   guaiFENesin -dextromethorphan  (ROBITUSSIN DM) 100-10 MG/5ML syrup 5 mL  5 mL Oral Q4H PRN Patel, Pranav M, MD   5 mL at 06/28/24 1951   loperamide  (IMODIUM ) capsule 2 mg  2 mg Oral PRN Alto Isaiah CROME, NP   2 mg at 07/01/24 2052   losartan  (COZAAR ) tablet 50 mg  50 mg Oral Daily Dorrell, Charleston, MD   50 mg at 07/02/24 9068   ondansetron  (ZOFRAN ) tablet 4 mg  4 mg Oral Q6H PRN Dena Charleston, MD       Or   ondansetron  (ZOFRAN ) injection 4 mg  4 mg Intravenous Q6H PRN Dena Charleston, MD       Oral care mouth rinse  15 mL Mouth Rinse PRN Dorrell, Robert, MD       phenol (CHLORASEPTIC) mouth spray 1 spray  1 spray Mouth/Throat PRN Tobie Yetta HERO, MD       valACYclovir  (VALTREX ) tablet 500 mg  500 mg Oral QODAY Dorrell, Robert, MD   500 mg at 07/02/24 9068     Discharge Medications: Please see discharge summary for a list of discharge medications.  Relevant Imaging Results:  Relevant Lab Results:   Additional Information SS# [id number]  Doneta Glenys DASEN, RN     "

## 2024-07-03 NOTE — Care Management (Signed)
 Patient seen and examined. Discharge summary updated . Stable to discharge today to SNF

## 2024-07-03 NOTE — TOC Transition Note (Signed)
 Transition of Care [facility]) - Discharge Note   Patient Details  Name: [name] MRN: [id number] Date of Birth: 02-28-1941  Transition of Care [facility]) CM/SW Contact:  Doneta Glenys DASEN, RN Phone Number: 07/03/2024, 9:25 AM   Clinical Narrative:    Per MD patient ready for DC to [facility] RN to call report prior to discharge [phone] Rm [id number]). RN, patient, patient's family, and facility notified of DC. Discharge Summary and FL2 sent to facility via HUB. DC packet on chart includes face sheet, medical necessity, discharge summary. Ambulance [facility] transport requested for patient.   CM signing off. Please consult us  again if new needs arise.     Final next level of care: Skilled Nursing Facility Barriers to Discharge: Barriers Resolved   Patient Goals and CMS Choice Patient states their goals for this hospitalization and ongoing recovery are:: [facility] Place CMS Medicare.gov Compare Post Acute Care list provided to:: Patient Represenative (must comment) [facility]) Choice offered to / list presented to : [facility] POA / Guardian [place] ownership interest in [facility]provided to:: [facility] POA / Guardian    Discharge Placement   Existing PASRR number confirmed : 07/03/24          Patient chooses bed at: [facility] Patient to be transferred to facility by: [facility] Name of family member notified: [facility] Patient and family notified of of transfer: 07/03/24  Discharge Plan and Services Additional resources added to the After Visit Summary for   In-house Referral: NA Discharge Planning Services: CM Consult            DME Arranged: N/A DME Agency: NA       HH Arranged: NA HH Agency: NA        Social Drivers of Health (SDOH) Interventions SDOH Screenings   Food Insecurity: No Food Insecurity (06/28/2024)  Housing: Low Risk (06/28/2024)  Transportation Needs: No Transportation Needs (06/28/2024)  Utilities: Not At Risk (06/28/2024)  Social Connections: Socially  Integrated (06/28/2024)  Tobacco Use: Medium Risk (06/28/2024)     Readmission Risk Interventions     No data to display

## 2024-07-03 NOTE — Discharge Summary (Signed)
 Physician Discharge Summary  [name] FMW:[id number] DOB: 06/09/1941 DOA: 06/27/2024  PCP: Dwight Trula SQUIBB, MD  Admit date: 06/27/2024 Discharge date: 07/03/2024  Admitted From: Home Disposition: Skilled nursing facility  Recommendations for Outpatient Follow-up:  Follow up with PCP in 1-2 weeks Please obtain BMP/CBC in one week Please follow up on the following pending results:   Discharge Condition: Stable CODE STATUS: Full code Diet recommendation: Regular diet  Discharge summary: 84 year old with history of hypertension, GERD presented with generalized fatigue and weakness and found to have influenza A infection.  Medically stabilized but remains very debilitated.    Acute influenza A infection with acute metabolic encephalopathy and deconditioning: Treated symptomatically with IV fluids.  Clinically improving. Completed Tamiflu . Continue to provide respiratory therapy, chest physiotherapy, mucolytic's, bronchodilator therapy and mobility with PT OT. Without focal deficits. PT OT recommending SNF,.  Patient is going to SNF for rehab before going home.   Essential hypertension: Blood pressure stable.  Patient on losartan  hydrochlorothiazide  as well as Lasix .  Will discontinue the combination, continue losartan  and continue Lasix .     Anxiety, on Prozac .   Medically stable to transition to skilled level of care.   Discharge Diagnoses:  Principal Problem:   Influenza    Discharge Instructions  Discharge Instructions     Diet - low sodium heart healthy   Complete by: As directed    Increase activity slowly   Complete by: As directed       Allergies as of 07/03/2024       Reactions   Other Itching, Other (See Comments)   Pollen = Itchy eyes/runny nose        Medication List     STOP taking these medications    LORazepam  0.5 MG tablet Commonly known as: ATIVAN    losartan -hydrochlorothiazide  50-12.5 MG tablet Commonly known as: HYZAAR    metroNIDAZOLE  500 MG tablet Commonly known as: FLAGYL    oseltamivir  75 MG capsule Commonly known as: TAMIFLU    potassium chloride  SA 20 MEQ tablet Commonly known as: KLOR-CON  M   senna-docusate 8.6-50 MG tablet Commonly known as: Senokot-S       TAKE these medications    CALCIUM  + VITAMIN D3 PO Take 1 tablet by mouth daily.   cetirizine 10 MG tablet Commonly known as: ZYRTEC Take 10 mg by mouth daily as needed for allergies.   FLUoxetine  20 MG tablet Commonly known as: PROZAC  Take 60 mg by mouth in the morning. What changed: Another medication with the same name was removed. Continue taking this medication, and follow the directions you see here.   furosemide  40 MG tablet Commonly known as: LASIX  Take 1 tablet (40 mg total) by mouth daily. What changed: Another medication with the same name was removed. Continue taking this medication, and follow the directions you see here.   guaiFENesin -dextromethorphan  100-10 MG/5ML syrup Commonly known as: ROBITUSSIN DM Take 5 mLs by mouth every 4 (four) hours as needed for cough.   losartan  50 MG tablet Commonly known as: COZAAR  Take 1 tablet (50 mg total) by mouth daily.   polyethylene glycol 17 g packet Commonly known as: MIRALAX  / GLYCOLAX  Take 17 g by mouth daily as needed.   potassium chloride  10 MEQ tablet Commonly known as: KLOR-CON  Take 10 mEq by mouth daily.   Tylenol  8 Hour Arthritis Pain 650 MG CR tablet Generic drug: acetaminophen  Take 1,300 mg by mouth every 8 (eight) hours as needed for pain. What changed: Another medication with the same name was  removed. Continue taking this medication, and follow the directions you see here.   valACYclovir  500 MG tablet Commonly known as: VALTREX  Take 1 tablet (500 mg total) by mouth every other day.        Contact information for after-discharge care     Destination     [facility], [place] .   Service: Skilled Nursing Contact  information: [address] [place]  [phone]                    Allergies[1]  Consultations: None   Procedures/Studies: CT HEAD WO CONTRAST ( ) Result Date: 06/28/2024 EXAM: CT HEAD WITHOUT CONTRAST 06/28/2024 12:37:22 AM TECHNIQUE: CT of the head was performed without the administration of intravenous contrast. Automated exposure control, iterative reconstruction, and/or weight based adjustment of the mA/kV was utilized to reduce the radiation dose to as low as reasonably achievable. COMPARISON: 03/05/2024 CLINICAL HISTORY: Neuro deficit, acute, stroke suspected. FINDINGS: BRAIN AND VENTRICLES: No acute hemorrhage. No evidence of acute infarct. Global cortical atrophy. Subcortical and periventricular small vessel ischemic changes. Old left basal ganglia lacunar infarct. Intracranial atherosclerosis. No hydrocephalus. No extra-axial collection. No mass effect or midline shift. ORBITS: No acute abnormality. SINUSES: No acute abnormality. SOFT TISSUES AND SKULL: No acute soft tissue abnormality. No skull fracture. IMPRESSION: 1. No acute intracranial abnormality. Electronically signed by: Pinkie Pebbles MD MD 06/28/2024 12:39 AM EST RP Workstation: [id number]   DG Wrist Complete Left Result Date: 06/27/2024 EXAM: 3 or more VIEW(S) XRAY OF THE LEFT WRIST 06/27/2024 01:02:42 AM COMPARISON: None available. CLINICAL HISTORY: Fall FINDINGS: BONES AND JOINTS: No acute fracture. Diffusely decreased bone density. Mild degenerative changes of the Triscaphe joint. SOFT TISSUES: Vascular calcifications. IMPRESSION: 1. No evidence of acute traumatic injury. Electronically signed by: Morgane Naveau MD MD 06/27/2024 01:07 AM EST RP Workstation: [id number]   DG Chest 2 View Result Date: 06/27/2024 EXAM: 2 VIEW(S) XRAY OF THE CHEST 06/27/2024 01:02:42 AM COMPARISON: None available. CLINICAL HISTORY: Cough FINDINGS: LUNGS AND PLEURA: Low lung volume. No focal pulmonary  opacity. No pleural effusion. No pneumothorax. HEART AND MEDIASTINUM: Aortic arch calcifications. No acute abnormality of the cardiac and mediastinal silhouettes. BONES AND SOFT TISSUES: Severe degenerative changes of left shoulder. Likely right shoulder calcific tendinosis. No acute osseous abnormality. IMPRESSION: 1. Low lung volumes. No acute cardiopulmonary findings. Electronically signed by: Morgane Naveau MD MD 06/27/2024 01:06 AM EST RP Workstation: [id number]   (Echo, Carotid, EGD, Colonoscopy, ERCP)    Subjective: seen and examined . Some dry cough. Walking better . Eager to go to snf    Discharge Exam: Vitals:   07/02/24 1332 07/03/24 0334  BP: (!) 121/59 (!) 119/58  Pulse: 63 (!) 57  Resp:  18  Temp: 98.4 F (36.9 C) 98.8 F (37.1 C)  SpO2: 91% 90%   Vitals:   07/01/24 2038 07/02/24 0605 07/02/24 1332 07/03/24 0334  BP: (!) 122/55 133/62 (!) 121/59 (!) 119/58  Pulse: 65 66 63 (!) 57  Resp:    18  Temp: (!) 97.4 F (36.3 C) 98.4 F (36.9 C) 98.4 F (36.9 C) 98.8 F (37.1 C)  TempSrc: Oral Oral  Oral  SpO2: 94% 93% 91% 90%  Weight:      Height:        General: Pt is alert, awake, not in acute distress Cardiovascular: RRR, S1/S2 +, no rubs, no gallops Respiratory: CTA bilaterally, no wheezing, no rhonchi Abdominal: Soft, NT, ND, bowel sounds + Extremities: no edema, no  cyanosis    The results of significant diagnostics from this hospitalization (including imaging, microbiology, ancillary and laboratory) are listed below for reference.     Microbiology: Recent Results (from the past 240 hours)  Resp panel by RT-PCR (RSV, Flu A&B, Covid) Anterior Nasal Swab     Status: Abnormal   Collection Time: 06/27/24  1:17 AM   Specimen: Anterior Nasal Swab  Result Value Ref Range Status   SARS Coronavirus 2 by RT PCR NEGATIVE NEGATIVE Final    Comment: (NOTE) SARS-CoV-2 target nucleic acids are NOT DETECTED.  The SARS-CoV-2 RNA is generally detectable in upper  respiratory specimens during the acute phase of infection. The lowest concentration of SARS-CoV-2 viral copies this assay can detect is 138 copies/mL. A negative result does not preclude SARS-Cov-2 infection and should not be used as the sole basis for treatment or other patient management decisions. A negative result may occur with  improper specimen collection/handling, submission of specimen other than nasopharyngeal swab, presence of viral mutation(s) within the areas targeted by this assay, and inadequate number of viral copies(<138 copies/mL). A negative result must be combined with clinical observations, patient history, and epidemiological information. The expected result is Negative.  Fact Sheet for Patients:  bloggercourse.com  Fact Sheet for Healthcare Providers:  seriousbroker.it  This test is no t yet approved or cleared by the [place]  FDA and  has been authorized for detection and/or diagnosis of SARS-CoV-2 by FDA under an Emergency Use Authorization (EUA). This EUA will remain  in effect (meaning this test can be used) for the duration of the COVID-19 declaration under Section 564(b)(1) of the Act, 21 U.S.C.section 360bbb-3(b)(1), unless the authorization is terminated  or revoked sooner.       Influenza A by PCR POSITIVE (A) NEGATIVE Final   Influenza B by PCR NEGATIVE NEGATIVE Final    Comment: (NOTE) The Xpert Xpress SARS-CoV-2/FLU/RSV plus assay is intended as an aid in the diagnosis of influenza from Nasopharyngeal swab specimens and should not be used as a sole basis for treatment. Nasal washings and aspirates are unacceptable for Xpert Xpress SARS-CoV-2/FLU/RSV testing.  Fact Sheet for Patients: bloggercourse.com  Fact Sheet for Healthcare Providers: seriousbroker.it  This test is not yet approved or cleared by the [place]  FDA and has been  authorized for detection and/or diagnosis of SARS-CoV-2 by FDA under an Emergency Use Authorization (EUA). This EUA will remain in effect (meaning this test can be used) for the duration of the COVID-19 declaration under Section 564(b)(1) of the Act, 21 U.S.C. section 360bbb-3(b)(1), unless the authorization is terminated or revoked.     Resp Syncytial Virus by PCR NEGATIVE NEGATIVE Final    Comment: (NOTE) Fact Sheet for Patients: bloggercourse.com  Fact Sheet for Healthcare Providers: seriousbroker.it  This test is not yet approved or cleared by the [place]  FDA and has been authorized for detection and/or diagnosis of SARS-CoV-2 by FDA under an Emergency Use Authorization (EUA). This EUA will remain in effect (meaning this test can be used) for the duration of the COVID-19 declaration under Section 564(b)(1) of the Act, 21 U.S.C. section 360bbb-3(b)(1), unless the authorization is terminated or revoked.  Performed at [facility], [address], [place], [place] [address]      Labs: BNP (last 3 results) Recent Labs    10/04/23 1450  BNP 128.8*   Basic Metabolic Panel: Recent Labs  Lab 06/27/24 0136 06/27/24 2326 06/30/24 1338  NA 137 137 136  K 4.0 4.2  3.5  CL 100 101 99  CO2 26 25 26   GLUCOSE 98 95 146*  BUN 20 15 22   CREATININE 0.73 0.75 0.80  CALCIUM  9.2 8.8* 9.1  MG  --   --  2.3   Liver Function Tests: Recent Labs  Lab 06/27/24 0136 06/30/24 1338  AST 22 22  ALT 11 10  ALKPHOS 126 109  BILITOT 0.3 0.4  PROT 7.5 7.1  ALBUMIN 4.2 3.9   No results for input(s): LIPASE, AMYLASE in the last 168 hours. No results for input(s): AMMONIA in the last 168 hours. CBC: Recent Labs  Lab 06/27/24 0136 06/27/24 2326 06/30/24 1338  WBC 5.9 5.6 4.6  NEUTROABS 4.5 4.2  --   HGB 13.7 13.5 13.6  HCT 43.5 43.4 43.3  MCV 89.9 89.7 88.5  PLT 162 133* 143*   Cardiac  Enzymes: Recent Labs  Lab 06/30/24 1338  CKTOTAL 63   BNP: Invalid input(s): POCBNP CBG: No results for input(s): GLUCAP in the last 168 hours. D-Dimer No results for input(s): DDIMER in the last 72 hours. Hgb A1c No results for input(s): HGBA1C in the last 72 hours. Lipid Profile No results for input(s): CHOL, HDL, LDLCALC, TRIG, CHOLHDL, LDLDIRECT in the last 72 hours. Thyroid  function studies Recent Labs    06/30/24 1338  TSH 1.970   Anemia work up Recent Labs    06/30/24 1334 06/30/24 1337  VITAMINB12  --  1,248*  FOLATE 8.8  --    Urinalysis    Component Value Date/Time   COLORURINE YELLOW 02/25/2023 0659   APPEARANCEUR HAZY (A) 02/25/2023 0659   LABSPEC 1.020 02/25/2023 0659   PHURINE 5.0 02/25/2023 0659   GLUCOSEU NEGATIVE 02/25/2023 0659   HGBUR SMALL (A) 02/25/2023 0659   BILIRUBINUR NEGATIVE 02/25/2023 0659   KETONESUR NEGATIVE 02/25/2023 0659   PROTEINUR 30 (A) 02/25/2023 0659   UROBILINOGEN 0.2 01/19/2013 1022   NITRITE NEGATIVE 02/25/2023 0659   LEUKOCYTESUR MODERATE (A) 02/25/2023 0659   Sepsis Labs Recent Labs  Lab 06/27/24 0136 06/27/24 2326 06/30/24 1338  WBC 5.9 5.6 4.6   Microbiology Recent Results (from the past 240 hours)  Resp panel by RT-PCR (RSV, Flu A&B, Covid) Anterior Nasal Swab     Status: Abnormal   Collection Time: 06/27/24  1:17 AM   Specimen: Anterior Nasal Swab  Result Value Ref Range Status   SARS Coronavirus 2 by RT PCR NEGATIVE NEGATIVE Final    Comment: (NOTE) SARS-CoV-2 target nucleic acids are NOT DETECTED.  The SARS-CoV-2 RNA is generally detectable in upper respiratory specimens during the acute phase of infection. The lowest concentration of SARS-CoV-2 viral copies this assay can detect is 138 copies/mL. A negative result does not preclude SARS-Cov-2 infection and should not be used as the sole basis for treatment or other patient management decisions. A negative result may occur with   improper specimen collection/handling, submission of specimen other than nasopharyngeal swab, presence of viral mutation(s) within the areas targeted by this assay, and inadequate number of viral copies(<138 copies/mL). A negative result must be combined with clinical observations, patient history, and epidemiological information. The expected result is Negative.  Fact Sheet for Patients:  bloggercourse.com  Fact Sheet for Healthcare Providers:  seriousbroker.it  This test is no t yet approved or cleared by the [place]  FDA and  has been authorized for detection and/or diagnosis of SARS-CoV-2 by FDA under an Emergency Use Authorization (EUA). This EUA will remain  in effect (meaning this test can be used)  for the duration of the COVID-19 declaration under Section 564(b)(1) of the Act, 21 U.S.C.section 360bbb-3(b)(1), unless the authorization is terminated  or revoked sooner.       Influenza A by PCR POSITIVE (A) NEGATIVE Final   Influenza B by PCR NEGATIVE NEGATIVE Final    Comment: (NOTE) The Xpert Xpress SARS-CoV-2/FLU/RSV plus assay is intended as an aid in the diagnosis of influenza from Nasopharyngeal swab specimens and should not be used as a sole basis for treatment. Nasal washings and aspirates are unacceptable for Xpert Xpress SARS-CoV-2/FLU/RSV testing.  Fact Sheet for Patients: bloggercourse.com  Fact Sheet for Healthcare Providers: seriousbroker.it  This test is not yet approved or cleared by the [place]  FDA and has been authorized for detection and/or diagnosis of SARS-CoV-2 by FDA under an Emergency Use Authorization (EUA). This EUA will remain in effect (meaning this test can be used) for the duration of the COVID-19 declaration under Section 564(b)(1) of the Act, 21 U.S.C. section 360bbb-3(b)(1), unless the authorization is terminated  or revoked.     Resp Syncytial Virus by PCR NEGATIVE NEGATIVE Final    Comment: (NOTE) Fact Sheet for Patients: bloggercourse.com  Fact Sheet for Healthcare Providers: seriousbroker.it  This test is not yet approved or cleared by the [place]  FDA and has been authorized for detection and/or diagnosis of SARS-CoV-2 by FDA under an Emergency Use Authorization (EUA). This EUA will remain in effect (meaning this test can be used) for the duration of the COVID-19 declaration under Section 564(b)(1) of the Act, 21 U.S.C. section 360bbb-3(b)(1), unless the authorization is terminated or revoked.  Performed at [facility], [address] [place], [place] [address]      Time coordinating discharge: 32 minutes  SIGNED:   Renato Applebaum, MD  [facility] 07/03/2024, 9:00 AM      [1]  Allergies Allergen Reactions   Other Itching and Other (See Comments)    Pollen = Itchy eyes/runny nose

## 2024-07-03 NOTE — Care Plan (Signed)
 Report given to number [phone] [place] Rm 804A to [place].  Pt has all belongings and medicaitons.  AVS dc packet placed in dc folder.  Awaiting transportation

## 2024-07-03 NOTE — Plan of Care (Signed)

## 2024-07-30 ENCOUNTER — Encounter: Admitting: Nurse Practitioner
